# Patient Record
Sex: Male | Born: 1960
Health system: Southern US, Community
[De-identification: ages and names within clinical notes are randomized; demographics above are authoritative.]

## PROBLEM LIST (undated history)

## (undated) DIAGNOSIS — C099 Malignant neoplasm of tonsil, unspecified: Secondary | ICD-10-CM

## (undated) DIAGNOSIS — C44519 Basal cell carcinoma of skin of other part of trunk: Secondary | ICD-10-CM

## (undated) DIAGNOSIS — K219 Gastro-esophageal reflux disease without esophagitis: Secondary | ICD-10-CM

## (undated) DIAGNOSIS — I10 Essential (primary) hypertension: Secondary | ICD-10-CM

## (undated) DIAGNOSIS — I999 Unspecified disorder of circulatory system: Secondary | ICD-10-CM

## (undated) DIAGNOSIS — E7800 Pure hypercholesterolemia, unspecified: Secondary | ICD-10-CM

## (undated) DIAGNOSIS — Z86718 Personal history of other venous thrombosis and embolism: Secondary | ICD-10-CM

## (undated) DIAGNOSIS — C799 Secondary malignant neoplasm of unspecified site: Secondary | ICD-10-CM

## (undated) DIAGNOSIS — R519 Headache, unspecified: Secondary | ICD-10-CM

## (undated) DIAGNOSIS — I8290 Acute embolism and thrombosis of unspecified vein: Secondary | ICD-10-CM

## (undated) DIAGNOSIS — R42 Dizziness and giddiness: Secondary | ICD-10-CM

## (undated) DIAGNOSIS — C44311 Basal cell carcinoma of skin of nose: Secondary | ICD-10-CM

## (undated) HISTORY — DX: Secondary malignant neoplasm of unspecified site: C79.9

## (undated) HISTORY — DX: Basal cell carcinoma of skin of nose: C44.311

## (undated) HISTORY — PX: FEMORAL-FEMORAL BYPASS GRAFT: SHX936

## (undated) HISTORY — DX: Gastro-esophageal reflux disease without esophagitis: K21.9

## (undated) HISTORY — DX: Pure hypercholesterolemia, unspecified: E78.00

## (undated) HISTORY — DX: Dizziness and giddiness: R42

## (undated) HISTORY — DX: Essential (primary) hypertension: I10

## (undated) HISTORY — PX: COLONOSCOPY: SHX174

## (undated) HISTORY — DX: Basal cell carcinoma of skin of other part of trunk: C44.519

---
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
## 2006-02-25 ENCOUNTER — Inpatient Hospital Stay: Payer: Self-pay | Admitting: Internal Medicine

## 2006-02-25 ENCOUNTER — Other Ambulatory Visit: Payer: Self-pay

## 2006-03-26 ENCOUNTER — Ambulatory Visit: Payer: Self-pay | Admitting: *Deleted

## 2008-06-20 ENCOUNTER — Emergency Department (HOSPITAL_COMMUNITY): Admission: EM | Admit: 2008-06-20 | Discharge: 2008-06-20 | Payer: Self-pay | Admitting: Emergency Medicine

## 2009-05-23 ENCOUNTER — Ambulatory Visit: Payer: Self-pay | Admitting: Otolaryngology

## 2011-02-16 DIAGNOSIS — Z8669 Personal history of other diseases of the nervous system and sense organs: Secondary | ICD-10-CM | POA: Insufficient documentation

## 2011-04-06 ENCOUNTER — Observation Stay: Payer: Self-pay | Admitting: Internal Medicine

## 2011-05-19 LAB — DIFFERENTIAL
Basophils Relative: 0
Lymphocytes Relative: 18
Lymphs Abs: 2.1
Monocytes Absolute: 0.7
Monocytes Relative: 6
Neutrophils Relative %: 76

## 2011-05-19 LAB — PROTEIN AND GLUCOSE, CSF
Glucose, CSF: 65
Total  Protein, CSF: 49 — ABNORMAL HIGH

## 2011-05-19 LAB — BASIC METABOLIC PANEL
CO2: 19
Calcium: 9.6
Chloride: 104
Glucose, Bld: 139 — ABNORMAL HIGH
Potassium: 3.3 — ABNORMAL LOW
Sodium: 136

## 2011-05-19 LAB — CSF CELL COUNT WITH DIFFERENTIAL
RBC Count, CSF: 1 — ABNORMAL HIGH
WBC, CSF: 1

## 2011-05-19 LAB — URINALYSIS, ROUTINE W REFLEX MICROSCOPIC
Hgb urine dipstick: NEGATIVE
Ketones, ur: >80 — AB
Nitrite: NEGATIVE
Protein, ur: NEGATIVE
Specific Gravity, Urine: 1.025

## 2011-05-19 LAB — URINE MICROSCOPIC-ADD ON

## 2011-05-19 LAB — GRAM STAIN

## 2011-05-19 LAB — CBC
Platelets: 361
RBC: 5.21

## 2011-05-19 LAB — CSF CULTURE W GRAM STAIN: Culture: NO GROWTH

## 2011-10-05 ENCOUNTER — Encounter: Payer: Self-pay | Admitting: Unknown Physician Specialty

## 2011-10-16 ENCOUNTER — Encounter: Payer: Self-pay | Admitting: Unknown Physician Specialty

## 2011-11-16 ENCOUNTER — Encounter: Payer: Self-pay | Admitting: Unknown Physician Specialty

## 2011-12-16 ENCOUNTER — Encounter: Payer: Self-pay | Admitting: Unknown Physician Specialty

## 2012-02-15 ENCOUNTER — Ambulatory Visit: Payer: Self-pay | Admitting: Neurology

## 2012-07-09 ENCOUNTER — Ambulatory Visit: Payer: Self-pay | Admitting: Neurology

## 2012-07-09 LAB — CREATININE, SERUM
Creatinine: 0.95 mg/dL (ref 0.60–1.30)
EGFR (African American): >60

## 2013-01-17 ENCOUNTER — Ambulatory Visit (INDEPENDENT_AMBULATORY_CARE_PROVIDER_SITE_OTHER): Payer: BC Managed Care – PPO | Admitting: Internal Medicine

## 2013-01-17 ENCOUNTER — Encounter: Payer: Self-pay | Admitting: Internal Medicine

## 2013-01-17 VITALS — BP 124/90 | HR 89 | Temp 97.8°F | Ht 66.5 in | Wt 156.5 lb

## 2013-01-17 DIAGNOSIS — E7800 Pure hypercholesterolemia, unspecified: Secondary | ICD-10-CM | POA: Insufficient documentation

## 2013-01-17 DIAGNOSIS — K219 Gastro-esophageal reflux disease without esophagitis: Secondary | ICD-10-CM

## 2013-01-17 DIAGNOSIS — I10 Essential (primary) hypertension: Secondary | ICD-10-CM | POA: Insufficient documentation

## 2013-01-17 DIAGNOSIS — R42 Dizziness and giddiness: Secondary | ICD-10-CM

## 2013-01-17 LAB — CBC WITH DIFFERENTIAL/PLATELET
Basophils Relative: 0.3 % (ref 0.0–3.0)
Eosinophils Relative: 2.9 % (ref 0.0–5.0)
HCT: 41.2 % (ref 39.0–52.0)
Lymphocytes Relative: 33.4 % (ref 12.0–46.0)
Lymphs Abs: 2.3 10*3/uL (ref 0.7–4.0)
MCHC: 33.4 g/dL (ref 30.0–36.0)
MCV: 84.7 fl (ref 78.0–100.0)
RBC: 4.86 Mil/uL (ref 4.22–5.81)
RDW: 13.7 % (ref 11.5–14.6)

## 2013-01-17 LAB — COMPREHENSIVE METABOLIC PANEL
ALT: 25 U/L (ref 0–53)
AST: 22 U/L (ref 0–37)
Albumin: 4 g/dL (ref 3.5–5.2)
Calcium: 9.3 mg/dL (ref 8.4–10.5)
Chloride: 104 mEq/L (ref 96–112)
Sodium: 138 mEq/L (ref 135–145)
Total Bilirubin: 0.5 mg/dL (ref 0.3–1.2)
Total Protein: 7.8 g/dL (ref 6.0–8.3)

## 2013-01-17 LAB — TSH: TSH: 0.81 u[IU]/mL (ref 0.35–5.50)

## 2013-01-17 LAB — LIPID PANEL
HDL: 38.6 mg/dL — ABNORMAL LOW (ref >39.00)
Total CHOL/HDL Ratio: 7
Triglycerides: 154 mg/dL — ABNORMAL HIGH (ref 0.0–149.0)

## 2013-01-18 ENCOUNTER — Other Ambulatory Visit: Payer: Self-pay | Admitting: Internal Medicine

## 2013-01-18 DIAGNOSIS — E7800 Pure hypercholesterolemia, unspecified: Secondary | ICD-10-CM

## 2013-01-18 NOTE — Progress Notes (Signed)
Order placed for liver panel.  

## 2013-01-19 ENCOUNTER — Other Ambulatory Visit: Payer: Self-pay | Admitting: *Deleted

## 2013-01-19 MED ORDER — ATORVASTATIN CALCIUM 10 MG PO TABS: 10.0000 mg | ORAL_TABLET | Freq: Every day | ORAL | Status: DC

## 2013-01-19 NOTE — Telephone Encounter (Signed)
Spoke with pt re: lab results-Sent in Rx for Lipitor & pt scheduled to have Liver panel checked on 7/31

## 2013-01-23 ENCOUNTER — Encounter: Payer: Self-pay | Admitting: Internal Medicine

## 2013-01-23 DIAGNOSIS — K219 Gastro-esophageal reflux disease without esophagitis: Secondary | ICD-10-CM | POA: Insufficient documentation

## 2013-01-23 DIAGNOSIS — R42 Dizziness and giddiness: Secondary | ICD-10-CM | POA: Insufficient documentation

## 2013-01-23 NOTE — Assessment & Plan Note (Signed)
Low cholesterol diet and exercise.  On Lipitor.  Check lipid panel and liver function.

## 2013-01-23 NOTE — Assessment & Plan Note (Signed)
Symptoms controlled on prilosec.  Follow.   

## 2013-01-23 NOTE — Assessment & Plan Note (Signed)
Has had an extensive w/up.  Has seen multiple neurologists.  Was told his neck was contributing.  Exercises helping.  Follow.  Currently stable.

## 2013-01-23 NOTE — Assessment & Plan Note (Signed)
Blood pressure a little elevated today.  Have him spot check his pressure and record.  Get him back in soon to reassess.  Check metabolic panel.

## 2013-01-23 NOTE — Progress Notes (Signed)
  Subjective:    [name], male    DOB: [birth year], 52 y.o.   MRN: [id number]  HPI 52 year old male with past history of hypertension, hypercholesterolemia, GERD and dizziness.  He comes in today to follow up on these issues as well as to establish care.  Was previously followed at [facility].  Then was evaluated by Dr Gavin Potters.  States he was diagnosed with high blood pressure approximately 7-8 years ago.  Takes Lisinopril.  States controls his blood pressure.  Reports blood pressure averaging 130/90.  Has a history of dizziness.  Has had an extensive w/up.  Has seen Dr Thad Ranger at [facility], Dr Chestine Spore at [place] and a neurologist in [place].  Has had MRIs and other w/up.  Was told that his dizziness was probably a result of some underlying neck issues and pain.  He is doing exercises at home.  This helps.  Prilosec controls his acid reflux.  Reports bowels stable now.  Was previously told he had a "nervous stomach".  Works as a Curator.     Past Medical History  Diagnosis Date  . Hypercholesterolemia   . Hypertension     hx of HBP readings  . GERD (gastroesophageal reflux disease)   . Dizziness     Outpatient Encounter Prescriptions as of 01/17/2013  Medication Sig Dispense Refill  . lisinopril (PRINIVIL,ZESTRIL) 20 MG tablet Take 20 mg by mouth daily.      . Multiple Vitamins-Minerals (MULTIVITAMIN PO) Take by mouth daily.      Marland Kitchen omeprazole (PRILOSEC) 20 MG capsule Take 20 mg by mouth daily.       No facility-administered encounter medications on file as of 01/17/2013.    Review of Systems Patient denies any headache.  Chronic light headedness/dizziness as outlined.  No chest pain, tightness or palpitations.  No increased shortness of breath, cough or congestion.  Acid reflux controlled with prilosec.  No nausea or vomiting.  No abdominal pain or cramping.  No bowel change, such as diarrhea, constipation, BRBPR or melana.  Bowels stable.  No urine change.         Objective:   Physical Exam Filed Vitals:   01/17/13 0927  BP: 124/90  Pulse: 89  Temp: 97.8 F (36.6 C)   Blood pressure recheck:  142/92, pulse 13  52 year old male in no acute distress.  HEENT:  Nares - clear.  Oropharynx - without lesions. NECK:  Supple.  Nontender.  No audible carotid bruit.  HEART:  Appears to be regular.   LUNGS:  No crackles or wheezing audible.  Respirations even and unlabored.   RADIAL PULSE:  Equal bilaterally.  ABDOMEN:  Soft.  Nontender.  Bowel sounds present and normal.  No audible abdominal bruit.   EXTREMITIES:  No increased edema present.  DP pulses palpable and equal bilaterally.         Assessment & Plan:  HEALTH MAINTENANCE.  Will get him back in for a full physical.  Obtain outside records for review.

## 2013-02-06 ENCOUNTER — Telehealth: Payer: Self-pay | Admitting: *Deleted

## 2013-02-06 NOTE — Telephone Encounter (Signed)
On a new cholesterol medication (Lipitor) x 2 weeks. Reports feeling very jittery, and feels like heart rate is up.Please advise

## 2013-02-06 NOTE — Telephone Encounter (Signed)
I am ok if he stops the cholesterol medication, but I am not sure if this is causing the increased heart rate, etc.  If acute sx, then ok to stop, but I would recommend evaluation today to confirm nothing acute going on.  Can go to acute care ([place]).  Keep [place] posted.

## 2013-02-06 NOTE — Telephone Encounter (Signed)
Left detailed message & asked pt to give me a call back

## 2013-03-16 ENCOUNTER — Other Ambulatory Visit (INDEPENDENT_AMBULATORY_CARE_PROVIDER_SITE_OTHER): Payer: BC Managed Care – PPO

## 2013-03-16 DIAGNOSIS — E7800 Pure hypercholesterolemia, unspecified: Secondary | ICD-10-CM

## 2013-03-16 LAB — HEPATIC FUNCTION PANEL
Alkaline Phosphatase: 47 U/L (ref 39–117)
Bilirubin, Direct: 0.1 mg/dL (ref 0.0–0.3)

## 2013-03-22 ENCOUNTER — Ambulatory Visit (INDEPENDENT_AMBULATORY_CARE_PROVIDER_SITE_OTHER): Payer: BC Managed Care – PPO | Admitting: Internal Medicine

## 2013-03-22 ENCOUNTER — Encounter: Payer: Self-pay | Admitting: Internal Medicine

## 2013-03-22 VITALS — BP 130/100 | HR 92 | Temp 98.2°F | Ht 67.5 in | Wt 158.0 lb

## 2013-03-22 DIAGNOSIS — R42 Dizziness and giddiness: Secondary | ICD-10-CM

## 2013-03-22 DIAGNOSIS — Z125 Encounter for screening for malignant neoplasm of prostate: Secondary | ICD-10-CM

## 2013-03-22 DIAGNOSIS — Z1211 Encounter for screening for malignant neoplasm of colon: Secondary | ICD-10-CM

## 2013-03-22 DIAGNOSIS — I10 Essential (primary) hypertension: Secondary | ICD-10-CM

## 2013-03-22 DIAGNOSIS — E7800 Pure hypercholesterolemia, unspecified: Secondary | ICD-10-CM

## 2013-03-22 DIAGNOSIS — K219 Gastro-esophageal reflux disease without esophagitis: Secondary | ICD-10-CM

## 2013-03-24 ENCOUNTER — Encounter: Payer: Self-pay | Admitting: Internal Medicine

## 2013-03-24 NOTE — Progress Notes (Signed)
  Subjective:    [name], male    DOB: 11/28/1960, 52 y.o.   MRN: [id number]  HPI 52 year old male with past history of hypertension, hypercholesterolemia, GERD and dizziness.  He comes in today to follow up on these issues as well as for a complete physical exam.  States he was diagnosed with high blood pressure approximately 7-8 years ago.  Takes Lisinopril.  States controls his blood pressure.  Has a history of dizziness.  Has had an extensive w/up.  Has seen Dr Thad Ranger at [facility], Dr Chestine Spore at [place] and a neurologist in [place].  Has had MRIs and other w/up.  Was told that his dizziness was probably a result of some underlying neck issues and pain.  He is doing exercises at home.  This helps. Feels overall this is stable.  Prilosec controls his acid reflux.  Reports bowels stable now.  Was previously told he had a "nervous stomach".   Stable.  Works as a Curator.     Past Medical History  Diagnosis Date  . Hypercholesterolemia   . Hypertension     hx of HBP readings  . GERD (gastroesophageal reflux disease)   . Dizziness     Outpatient Encounter Prescriptions as of 03/22/2013  Medication Sig Dispense Refill  . lisinopril (PRINIVIL,ZESTRIL) 20 MG tablet Take 20 mg by mouth daily.      . Multiple Vitamins-Minerals (MULTIVITAMIN PO) Take by mouth daily.      Marland Kitchen omeprazole (PRILOSEC) 20 MG capsule Take 20 mg by mouth daily.      . [DISCONTINUED] atorvastatin (LIPITOR) 10 MG tablet Take 1 tablet (10 mg total) by mouth daily.  30 tablet  5   No facility-administered encounter medications on file as of 03/22/2013.    Review of Systems Patient denies any headache.  Chronic light headedness/dizziness as outlined.  Stable.   No chest pain, tightness or palpitations.  No increased shortness of breath, cough or congestion.  Acid reflux controlled with prilosec.  No nausea or vomiting.  No abdominal pain or cramping.  No bowel change, such as diarrhea, constipation, BRBPR or  melana.  Bowels stable.  No urine change.        Objective:   Physical Exam  Filed Vitals:   03/22/13 0927  BP: 130/100  Pulse: 92  Temp: 98.2 F (36.8 C)   Blood pressure recheck:  31/37  52 year old male in no acute distress.  HEENT:  Nares - clear.  Oropharynx - without lesions. NECK:  Supple.  Nontender.  No audible carotid bruit.  HEART:  Appears to be regular.   LUNGS:  No crackles or wheezing audible.  Respirations even and unlabored.   RADIAL PULSE:  Equal bilaterally.  ABDOMEN:  Soft.  Nontender.  Bowel sounds present and normal.  No audible abdominal bruit.  GU:  Normal descended testicles.  No palpable testicular nodules.   RECTAL:  Could not appreciate any palpable prostate nodules.  Heme negative.   EXTREMITIES:  No increased edema present.  DP pulses palpable and equal bilaterally.        Assessment & Plan:  HEALTH MAINTENANCE.  Physical today.  Check PSA next labs.  Cholesterol as outlined.    Discussed the need for colonoscopy.   He was in agreement.  Refer to GI for screening colonoscopy.

## 2013-03-24 NOTE — Assessment & Plan Note (Signed)
Low cholesterol diet and exercise.  Intolerant to statin.  Will follow.  Remain off cholesterol medicine for now.     

## 2013-03-24 NOTE — Assessment & Plan Note (Signed)
Has had an extensive w/up.  Has seen multiple neurologists.  Was told his neck was contributing.  Exercises helping.  Follow.  Currently stable.

## 2013-03-24 NOTE — Assessment & Plan Note (Signed)
Blood pressure as outlined.  States it has been better recently.  Have him to continue to spot check his pressure and record.  Get him back in soon to reassess.  Continue same medication regimen for now.

## 2013-03-24 NOTE — Assessment & Plan Note (Signed)
Symptoms controlled on prilosec.  Follow.   

## 2013-06-15 ENCOUNTER — Other Ambulatory Visit: Payer: BC Managed Care – PPO

## 2013-06-22 ENCOUNTER — Ambulatory Visit: Payer: BC Managed Care – PPO | Admitting: Internal Medicine

## 2013-06-23 ENCOUNTER — Ambulatory Visit: Payer: Self-pay | Admitting: Unknown Physician Specialty

## 2013-07-05 ENCOUNTER — Encounter: Payer: Self-pay | Admitting: Internal Medicine

## 2013-07-24 ENCOUNTER — Encounter: Payer: Self-pay | Admitting: Internal Medicine

## 2013-07-25 ENCOUNTER — Other Ambulatory Visit: Payer: Self-pay | Admitting: *Deleted

## 2013-07-25 ENCOUNTER — Telehealth: Payer: Self-pay | Admitting: Internal Medicine

## 2013-07-25 MED ORDER — OMEPRAZOLE 20 MG PO CPDR: 20.0000 mg | DELAYED_RELEASE_CAPSULE | Freq: Two times a day (BID) | ORAL | Status: DC

## 2013-07-25 NOTE — Telephone Encounter (Signed)
At his last visit, he had reported his acid reflux was controlled.  This is a change for him.  I am ok to increase his prilosec to 20mg  bid, but he needs an appt with me to f/u and discuss further treatment and evaluation.

## 2013-07-25 NOTE — Telephone Encounter (Signed)
Patient wanting his reflux medication (omeprazole (PRILOSEC) 20 MG capsule) increased. He still has acid coming up.

## 2013-07-25 NOTE — Telephone Encounter (Signed)
Please advise 

## 2013-07-25 NOTE — Telephone Encounter (Signed)
Pt notified & states that he will call back to setup appt. Sent in a 30 day with 1 additional refill (pt aware)

## 2013-09-12 ENCOUNTER — Encounter (INDEPENDENT_AMBULATORY_CARE_PROVIDER_SITE_OTHER): Payer: Self-pay

## 2013-09-12 ENCOUNTER — Encounter: Payer: Self-pay | Admitting: Internal Medicine

## 2013-09-12 ENCOUNTER — Ambulatory Visit (INDEPENDENT_AMBULATORY_CARE_PROVIDER_SITE_OTHER): Payer: BC Managed Care – PPO | Admitting: Internal Medicine

## 2013-09-12 VITALS — BP 138/90 | HR 101 | Temp 98.2°F | Ht 67.5 in | Wt 161.8 lb

## 2013-09-12 DIAGNOSIS — Z125 Encounter for screening for malignant neoplasm of prostate: Secondary | ICD-10-CM

## 2013-09-12 DIAGNOSIS — M542 Cervicalgia: Secondary | ICD-10-CM

## 2013-09-12 DIAGNOSIS — R42 Dizziness and giddiness: Secondary | ICD-10-CM

## 2013-09-12 DIAGNOSIS — I10 Essential (primary) hypertension: Secondary | ICD-10-CM

## 2013-09-12 DIAGNOSIS — K219 Gastro-esophageal reflux disease without esophagitis: Secondary | ICD-10-CM

## 2013-09-12 DIAGNOSIS — E7800 Pure hypercholesterolemia, unspecified: Secondary | ICD-10-CM

## 2013-09-12 LAB — COMPREHENSIVE METABOLIC PANEL
ALT: 28 U/L (ref 0–53)
AST: 24 U/L (ref 0–37)
Albumin: 4.2 g/dL (ref 3.5–5.2)
Alkaline Phosphatase: 59 U/L (ref 39–117)
BILIRUBIN TOTAL: 0.8 mg/dL (ref 0.3–1.2)
BUN: 19 mg/dL (ref 6–23)
CALCIUM: 9.6 mg/dL (ref 8.4–10.5)
CHLORIDE: 104 meq/L (ref 96–112)
CO2: 28 meq/L (ref 19–32)
Creatinine, Ser: 1 mg/dL (ref 0.4–1.5)
GFR: 88.08 mL/min (ref >60.00)
GLUCOSE: 89 mg/dL (ref 70–99)
POTASSIUM: 4.7 meq/L (ref 3.5–5.1)
SODIUM: 138 meq/L (ref 135–145)
TOTAL PROTEIN: 7.5 g/dL (ref 6.0–8.3)

## 2013-09-12 LAB — PSA: PSA: 0.61 ng/mL (ref 0.10–4.00)

## 2013-09-12 MED ORDER — OMEPRAZOLE 20 MG PO CPDR: 20.0000 mg | DELAYED_RELEASE_CAPSULE | Freq: Two times a day (BID) | ORAL | Status: DC

## 2013-09-12 MED ORDER — LISINOPRIL 20 MG PO TABS: 20.0000 mg | ORAL_TABLET | Freq: Every day | ORAL | Status: DC

## 2013-09-12 NOTE — Progress Notes (Signed)
  Subjective:    [name], male    DOB: [birth year], 53 y.o.   MRN: [id number]  HPI 53 year old male with past history of hypertension, hypercholesterolemia, GERD and dizziness.  He comes in today to for a scheduled follow up.  States he was diagnosed with high blood pressure approximately 7-8 years ago.  Takes Lisinopril.  States controls his blood pressure.  Has a history of dizziness.  Has had an extensive w/up.  Has seen Dr Doy Mince at [facility], Dr Carlis Abbott at [place] and a neurologist in [place].  Has had MRIs and other w/up.  Was told that his dizziness was probably a result of some underlying neck issues and pain.  Has had MRI of his neck.  He is doing exercises at home.  This helps.   Feels neck and upper back - worsening.    Prilosec controls his acid reflux.  Reports bowels stable now.  Was previously told he had a "nervous stomach".   Stable.  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  Feels if he can get this under control, dizziness will get better.  States it feels his neck is tight and feels like a knife stuck in his back.  Reports blood pressure averaging 110/75-125/85 (135-140/<90.     Past Medical History  Diagnosis Date  . Hypercholesterolemia   . Hypertension     hx of HBP readings  . GERD (gastroesophageal reflux disease)   . Dizziness     Outpatient Encounter Prescriptions as of 09/12/2013  Medication Sig  . lisinopril (PRINIVIL,ZESTRIL) 20 MG tablet Take 20 mg by mouth daily.  . Multiple Vitamins-Minerals (MULTIVITAMIN PO) Take by mouth daily.  Marland Kitchen omeprazole (PRILOSEC) 20 MG capsule Take 1 capsule (20 mg total) by mouth 2 (two) times daily.    Review of Systems Patient denies any headache.  Chronic light headedness/dizziness as outlined.  No chest pain, tightness or palpitations.  No increased shortness of breath, cough or congestion.  Acid reflux controlled with prilosec.  No nausea or vomiting.  No abdominal pain or cramping.  No  bowel change, such as diarrhea, constipation, BRBPR or melana.  Bowels stable.  No urine change.   Blood pressure as outlined.  Some neck stiffness and knife like sensation in his back.       Objective:   Physical Exam  Filed Vitals:   09/12/13 1352  BP: 138/90  Pulse: 101  Temp: 98.2 F (36.8 C)   Blood pressure recheck:  79/60-25  53 year old male in no acute distress.  HEENT:  Nares - clear.  Oropharynx - without lesions. NECK:  Supple.  Non tender.    No audible carotid bruit.  HEART:  Appears to be regular.   LUNGS:  No crackles or wheezing audible.  Respirations even and unlabored.   RADIAL PULSE:  Equal bilaterally.  ABDOMEN:  Soft.  Nontender.  Bowel sounds present and normal.  No audible abdominal bruit.  EXTREMITIES:  No increased edema present.  DP pulses palpable and equal bilaterally.        Assessment & Plan:  HEALTH MAINTENANCE.  Physical 03/22/13.  Check PSA next labs.  Cholesterol as outlined.  Colonoscopy 06/23/13 internal hemorrhoids.

## 2013-09-12 NOTE — Progress Notes (Signed)
Pre-visit discussion using our clinic review tool. No additional management support is needed unless otherwise documented below in the visit note.  

## 2013-09-13 ENCOUNTER — Encounter: Payer: Self-pay | Admitting: *Deleted

## 2013-09-16 ENCOUNTER — Encounter: Payer: Self-pay | Admitting: Internal Medicine

## 2013-09-16 DIAGNOSIS — M542 Cervicalgia: Secondary | ICD-10-CM | POA: Insufficient documentation

## 2013-09-16 NOTE — Assessment & Plan Note (Signed)
Described the neck pain and upper back pain.  Has been worked up in the past.  Had MRI.  Worsening problems recently.  Desires not to take pain medication.  Has done therapy and home exercise.  Will refer to Dr Sharlet Salina for further evaluation and treatment.  Will hold on further scanning until Dr Sharlet Salina review.

## 2013-09-16 NOTE — Assessment & Plan Note (Signed)
Symptoms controlled on prilosec.  Follow.   

## 2013-09-16 NOTE — Assessment & Plan Note (Signed)
Has had an extensive w/up.  Has seen multiple neurologists.  Was told his neck was contributing.  Doing exercises at home.  With neck stiffness and upper back pain.  Request further w/up and evaluation.  After discussion, it was decided to refer to Dr Sharlet Salina for further evaluation and treatment.  Has had MRI, etc.  Desires not to take medication for the pain and discomfort.

## 2013-09-16 NOTE — Assessment & Plan Note (Signed)
Low cholesterol diet and exercise.  Intolerant to statin.  Will follow.  Remain off cholesterol medicine for now.     

## 2013-09-16 NOTE — Assessment & Plan Note (Signed)
Blood pressure as outlined.  Have him to continue to spot check his pressure and record.   Continue same medication regimen for now.  Follow.  Check metabolic panel.

## 2013-09-26 ENCOUNTER — Telehealth: Payer: Self-pay | Admitting: Internal Medicine

## 2013-09-26 DIAGNOSIS — M542 Cervicalgia: Secondary | ICD-10-CM

## 2013-09-26 NOTE — Telephone Encounter (Signed)
In progress

## 2013-09-26 NOTE — Telephone Encounter (Signed)
The patient was calling to see if Dr. Nicki Reaper was going to refer him to [facility] for the pain in his neck and back. He was informed at his last visit that he would be referred out to see someone at [place]. I don't see a referral in the system.

## 2013-09-26 NOTE — Telephone Encounter (Signed)
See his phone message.  Please schedule an appt with Dr Sharlet Salina.  Info in my last note.  Thanks.

## 2013-09-26 NOTE — Telephone Encounter (Signed)
Please advise 

## 2013-09-26 NOTE — Telephone Encounter (Signed)
Noted  

## 2013-10-24 DIAGNOSIS — C44310 Basal cell carcinoma of skin of unspecified parts of face: Secondary | ICD-10-CM | POA: Insufficient documentation

## 2013-11-16 ENCOUNTER — Encounter: Payer: Self-pay | Admitting: Internal Medicine

## 2013-11-16 ENCOUNTER — Ambulatory Visit (INDEPENDENT_AMBULATORY_CARE_PROVIDER_SITE_OTHER): Payer: BC Managed Care – PPO | Admitting: Internal Medicine

## 2013-11-16 VITALS — BP 140/90 | HR 93 | Temp 98.1°F | Ht 67.5 in | Wt 160.2 lb

## 2013-11-16 DIAGNOSIS — R42 Dizziness and giddiness: Secondary | ICD-10-CM

## 2013-11-16 DIAGNOSIS — M542 Cervicalgia: Secondary | ICD-10-CM

## 2013-11-16 DIAGNOSIS — K219 Gastro-esophageal reflux disease without esophagitis: Secondary | ICD-10-CM

## 2013-11-16 DIAGNOSIS — E7800 Pure hypercholesterolemia, unspecified: Secondary | ICD-10-CM

## 2013-11-16 DIAGNOSIS — I10 Essential (primary) hypertension: Secondary | ICD-10-CM

## 2013-11-16 NOTE — Assessment & Plan Note (Addendum)
Described the neck pain and upper back pain.  Has been worked up in the past.  Had MRI.  Worsening problems recently.  Desires not to take pain medication.  Has done therapy and home exercise.  Will refer to Dr Sharlet Salina for further evaluation and treatment.  Will hold on further scanning until Dr Sharlet Salina review.  Has an appt with Dr Sharlet Salina in a few days.

## 2013-11-16 NOTE — Progress Notes (Signed)
Pre-visit discussion using our clinic review tool. No additional management support is needed unless otherwise documented below in the visit note.  

## 2013-11-16 NOTE — Progress Notes (Signed)
  Subjective:    [name], male    DOB: 06-19-1961, 53 y.o.   MRN: [id number]  HPI 53 year old male with past history of hypertension, hypercholesterolemia, GERD and dizziness.  He comes in today for a scheduled follow up.  States he was diagnosed with high blood pressure approximately 7-8 years ago.  Takes Lisinopril.  States controls his blood pressure.  Has a history of dizziness.  Has had an extensive w/up.  Has seen Dr Doy Mince at [facility], Dr Carlis Abbott at [place] and a neurologist in [place].  Has had MRIs and other w/up.  Was told that his dizziness was probably a result of some underlying neck issues and pain.  Has had MRI of his neck.  He is doing exercises at home.  This helps.   Feels neck and upper back - worsening.    When he is busier at work, neck and subsequently his dizziness is worse.  Has an appt with Dr Sharlet Salina in a few days to discuss other treatment options.  Prilosec controls his acid reflux.  Reports bowels stable now.  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  Feels if he can get this under control, dizziness will get better.  States it feels his neck is tight and feels like a knife stuck in his back.  Reports blood pressure averaging 118-130/70-85.      Past Medical History  Diagnosis Date  . Hypercholesterolemia   . Hypertension     hx of HBP readings  . GERD (gastroesophageal reflux disease)   . Dizziness     Outpatient Encounter Prescriptions as of 11/16/2013  Medication Sig  . lisinopril (PRINIVIL,ZESTRIL) 20 MG tablet Take 1 tablet (20 mg total) by mouth daily.  . Multiple Vitamins-Minerals (MULTIVITAMIN PO) Take by mouth daily.  Marland Kitchen omeprazole (PRILOSEC) 20 MG capsule Take 1 capsule (20 mg total) by mouth 2 (two) times daily.    Review of Systems Patient denies any headache.  Chronic light headedness/dizziness as outlined.  No chest pain, tightness or palpitations.  No increased shortness of breath, cough or congestion.  Acid  reflux controlled with prilosec.  No nausea or vomiting.  No abdominal pain or cramping.  No bowel change, such as diarrhea, constipation, BRBPR or melana.  Bowels stable.  No urine change.   Blood pressure as outlined.  Some neck stiffness and knife like sensation in his back.  Persistent neck pain.       Objective:   Physical Exam  Filed Vitals:   11/16/13 1537  BP: 140/90  Pulse: 93  Temp: 98.1 F (36.7 C)   Blood pressure recheck:  3-6/60  53 year old male in no acute distress.  HEENT:  Nares - clear.  Oropharynx - without lesions. NECK:  Supple.  Non tender.    No audible carotid bruit.  HEART:  Appears to be regular.   LUNGS:  No crackles or wheezing audible.  Respirations even and unlabored.   RADIAL PULSE:  Equal bilaterally.  ABDOMEN:  Soft.  Nontender.  Bowel sounds present and normal.  No audible abdominal bruit.  EXTREMITIES:  No increased edema present.  DP pulses palpable and equal bilaterally.        Assessment & Plan:  HEALTH MAINTENANCE.  Physical 03/22/13.  PSA 09/12/13 - .61.   Cholesterol as outlined.  Colonoscopy 06/23/13 internal hemorrhoids.

## 2013-11-16 NOTE — Assessment & Plan Note (Addendum)
Has had an extensive w/up.  Has seen multiple neurologists.  Was told his neck was contributing.  Doing exercises at home.  With neck stiffness and upper back pain.  Request further w/up and evaluation.  After discussion, it was decided to refer to Dr Sharlet Salina for further evaluation and treatment.  Has had MRI, etc.  Desires not to take medication for the pain and discomfort.  Has the appt with Dr Sharlet Salina in a few days.

## 2013-11-19 ENCOUNTER — Encounter: Payer: Self-pay | Admitting: Internal Medicine

## 2013-11-19 NOTE — Assessment & Plan Note (Signed)
Blood pressure as outlined.  Have him to continue to spot check his pressure and record.   Continue same medication regimen for now.  Follow.  Follow metabolic panel.

## 2013-11-19 NOTE — Assessment & Plan Note (Signed)
Low cholesterol diet and exercise.  Intolerant to statin.  Will follow.  Remain off cholesterol medicine for now.     

## 2013-11-19 NOTE — Assessment & Plan Note (Signed)
Symptoms controlled on prilosec.  Follow.   

## 2013-12-05 ENCOUNTER — Encounter: Payer: Self-pay | Admitting: *Deleted

## 2014-02-07 DIAGNOSIS — M7918 Myalgia, other site: Secondary | ICD-10-CM | POA: Insufficient documentation

## 2014-02-07 DIAGNOSIS — M5030 Other cervical disc degeneration, unspecified cervical region: Secondary | ICD-10-CM | POA: Insufficient documentation

## 2014-03-05 ENCOUNTER — Other Ambulatory Visit: Payer: Self-pay | Admitting: Internal Medicine

## 2014-03-22 ENCOUNTER — Other Ambulatory Visit: Payer: Self-pay | Admitting: Internal Medicine

## 2014-04-12 ENCOUNTER — Telehealth: Payer: Self-pay | Admitting: Internal Medicine

## 2014-04-12 ENCOUNTER — Encounter: Payer: Self-pay | Admitting: Internal Medicine

## 2014-04-12 ENCOUNTER — Ambulatory Visit (INDEPENDENT_AMBULATORY_CARE_PROVIDER_SITE_OTHER): Payer: BC Managed Care – PPO | Admitting: Internal Medicine

## 2014-04-12 VITALS — BP 130/80 | HR 89 | Temp 98.0°F | Ht 67.0 in | Wt 158.5 lb

## 2014-04-12 DIAGNOSIS — K219 Gastro-esophageal reflux disease without esophagitis: Secondary | ICD-10-CM

## 2014-04-12 DIAGNOSIS — R400 Somnolence: Secondary | ICD-10-CM

## 2014-04-12 DIAGNOSIS — G4710 Hypersomnia, unspecified: Secondary | ICD-10-CM

## 2014-04-12 DIAGNOSIS — E7800 Pure hypercholesterolemia, unspecified: Secondary | ICD-10-CM

## 2014-04-12 DIAGNOSIS — I10 Essential (primary) hypertension: Secondary | ICD-10-CM

## 2014-04-12 DIAGNOSIS — R0602 Shortness of breath: Secondary | ICD-10-CM

## 2014-04-12 DIAGNOSIS — R42 Dizziness and giddiness: Secondary | ICD-10-CM

## 2014-04-12 DIAGNOSIS — R079 Chest pain, unspecified: Secondary | ICD-10-CM

## 2014-04-12 DIAGNOSIS — M542 Cervicalgia: Secondary | ICD-10-CM

## 2014-04-12 NOTE — Telephone Encounter (Signed)
Pt need 4 week follow up for bp. Pt needs late Thursday or Friday mornings. Please advise

## 2014-04-12 NOTE — Progress Notes (Signed)
Pre visit review using our clinic review tool, if applicable. No additional management support is needed unless otherwise documented below in the visit note. 

## 2014-04-13 NOTE — Telephone Encounter (Signed)
I can see him 05/03/14 at 4:15 or 05/11/14 at 11:45

## 2014-04-15 ENCOUNTER — Encounter: Payer: Self-pay | Admitting: Internal Medicine

## 2014-04-15 DIAGNOSIS — R400 Somnolence: Secondary | ICD-10-CM | POA: Insufficient documentation

## 2014-04-15 NOTE — Assessment & Plan Note (Signed)
Has had an extensive w/up.  Has seen multiple neurologists.  Was told his neck was contributing.  Doing exercises at home.  With neck stiffness and upper back pain.  Has had MRI, etc.  Desires not to take medication for the pain and discomfort.  Saw Dr Sharlet Salina.  Home exercises are helping.  Follow.

## 2014-04-15 NOTE — Assessment & Plan Note (Signed)
Symptoms controlled on two prilosec per day.  Symptoms worsened when he cut back.  Now controlled.  Follow.

## 2014-04-15 NOTE — Assessment & Plan Note (Signed)
Blood pressure elevated on recheck.  States controlled at home.  Will continue to spot check his pressure.  Hold on additional medications.  Get him back soon to reassess.  Have him bring his cuff with him to make sure correlates.

## 2014-04-15 NOTE — Progress Notes (Signed)
Subjective:    [name], male    DOB: Dec 07, 1960, 53 y.o.   MRN: [id number]  HPI 53 year old male with past history of hypertension, hypercholesterolemia, GERD and dizziness.  He comes in today to follow up on these issues as well as for a complete physical exam.  States he was diagnosed with high blood pressure approximately 7-8 years ago.  Takes Lisinopril.  States controls his blood pressure.  Reports blood pressure has always been higher in the doctor's office.  Has a history of dizziness.  Has had an extensive w/up.  Has seen Dr Doy Mince at [facility], Dr Carlis Abbott at [place] and a neurologist in [place].  Has had MRIs and other w/up.  Was told that his dizziness was probably a result of some underlying neck issues and pain.  Has had MRI of his neck.  Has seen Dr Sharlet Salina.  Prefers not to take medication for his neck.  He is doing exercises at home.  Feels is better.  Prilosec controls his acid reflux.  Reports bowels stable now.  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  Frequently wakes up at night.  Does not feel rested.  Feels this may be aggravating his neck symptoms and his dizziness.  Discussed sleep study.  He is agreeable.  He does report he tried to decrease his prilosec.  He noticed worsening symptoms.  Chest discomfort and increased acid at night.  This resolved with restarting 2 prilosec per day.     Past Medical History  Diagnosis Date  . Hypercholesterolemia   . Hypertension     hx of HBP readings  . GERD (gastroesophageal reflux disease)   . Dizziness     Outpatient Encounter Prescriptions as of 04/12/2014  Medication Sig  . lisinopril (PRINIVIL,ZESTRIL) 20 MG tablet TAKE 1 TABLET (20 MG TOTAL) BY MOUTH DAILY.  . Multiple Vitamins-Minerals (MULTIVITAMIN PO) Take by mouth daily.  Marland Kitchen omeprazole (PRILOSEC) 20 MG capsule TAKE 1 CAPSULE (20 MG TOTAL) BY MOUTH 2 (TWO) TIMES DAILY.    Review of Systems Patient denies any headache.  Chronic light  headedness/dizziness as outlined.  No chest pain, tightness or palpitations.  No increased shortness of breath, cough or congestion.  Acid reflux now controlled with prilosec.  No nausea or vomiting.  No abdominal pain or cramping.  No bowel change, such as diarrhea, constipation, BRBPR or melana.  Bowels stable.  No urine change.   Some neck stiffness.  Better with exercise at home.  Fatigue as outlined.  Daytime fatigue.  See above.       Objective:   Physical Exam  Filed Vitals:   04/12/14 1500  BP: 130/80  Pulse: 89  Temp: 98 F (36.7 C)   Blood pressure recheck:  138-40/45-72  53 year old male in no acute distress.  HEENT:  Nares - clear.  Oropharynx - without lesions. NECK:  Supple.  Nontender.  No audible carotid bruit.  HEART:  Appears to be regular.   LUNGS:  No crackles or wheezing audible.  Respirations even and unlabored.   RADIAL PULSE:  Equal bilaterally.  ABDOMEN:  Soft.  Nontender.  Bowel sounds present and normal.  No audible abdominal bruit.  GU:  Normal descended testicles.  No palpable testicular nodules.   RECTAL:  Could not appreciate any palpable prostate nodules.  Heme negative.   EXTREMITIES:  No increased edema present.  DP pulses palpable and equal bilaterally.  Assessment & Plan:  CARDIOLOGY.  With the chest discomfort, EKG obtained.  EKG revealed SR with no acute ischemic changes.  Discussed further cardiac w/up.  No symptoms with increased activity or exertion.  Back on two prilosec now and feels better.  Wants to hold on any further cardiac w/up at this time.  Follow.    HEALTH MAINTENANCE.  Physical today.  PSA 09/12/13 - .61.   Cholesterol as outlined.  Colonoscopy 06/23/13 internal hemorrhoids.   I spent 25 minutes with the patient and more than 50% of the time was spent in consultation regarding the above.

## 2014-04-15 NOTE — Assessment & Plan Note (Signed)
Low cholesterol diet and exercise.  Intolerant to statin.  Will follow.  Remain off cholesterol medicine for now.

## 2014-04-15 NOTE — Assessment & Plan Note (Signed)
Concern over possible sleep apnea.  Wakes up at night.  Does not feel rested, etc.  Will schedule split night sleep study.

## 2014-04-15 NOTE — Assessment & Plan Note (Signed)
Described the neck pain and upper back pain previously.  Has been worked up in the past.  Had MRI.  Saw Dr Sharlet Salina.  Prefers not to take medication.  Continue home exercise.

## 2014-04-20 ENCOUNTER — Other Ambulatory Visit (INDEPENDENT_AMBULATORY_CARE_PROVIDER_SITE_OTHER): Payer: BC Managed Care – PPO

## 2014-04-20 DIAGNOSIS — E7800 Pure hypercholesterolemia, unspecified: Secondary | ICD-10-CM

## 2014-04-20 DIAGNOSIS — R42 Dizziness and giddiness: Secondary | ICD-10-CM

## 2014-04-20 LAB — CBC WITH DIFFERENTIAL/PLATELET
BASOS PCT: 0.7 % (ref 0.0–3.0)
Basophils Absolute: 0.1 10*3/uL (ref 0.0–0.1)
Eosinophils Absolute: 0.5 10*3/uL (ref 0.0–0.7)
Eosinophils Relative: 6.4 % — ABNORMAL HIGH (ref 0.0–5.0)
HCT: 40.4 % (ref 39.0–52.0)
Hemoglobin: 13.5 g/dL (ref 13.0–17.0)
Lymphocytes Relative: 37.2 % (ref 12.0–46.0)
Lymphs Abs: 2.9 10*3/uL (ref 0.7–4.0)
MCHC: 33.3 g/dL (ref 30.0–36.0)
MCV: 85.1 fl (ref 78.0–100.0)
MONOS PCT: 10 % (ref 3.0–12.0)
Monocytes Absolute: 0.8 10*3/uL (ref 0.1–1.0)
Neutro Abs: 3.6 10*3/uL (ref 1.4–7.7)
Neutrophils Relative %: 45.7 % (ref 43.0–77.0)
PLATELETS: 353 10*3/uL (ref 150.0–400.0)
RBC: 4.74 Mil/uL (ref 4.22–5.81)
RDW: 14 % (ref 11.5–15.5)
WBC: 7.9 10*3/uL (ref 4.0–10.5)

## 2014-04-20 LAB — COMPREHENSIVE METABOLIC PANEL
ALBUMIN: 4 g/dL (ref 3.5–5.2)
ALT: 32 U/L (ref 0–53)
AST: 33 U/L (ref 0–37)
Alkaline Phosphatase: 48 U/L (ref 39–117)
BUN: 17 mg/dL (ref 6–23)
CALCIUM: 9.4 mg/dL (ref 8.4–10.5)
CHLORIDE: 104 meq/L (ref 96–112)
CO2: 29 meq/L (ref 19–32)
Creatinine, Ser: 0.9 mg/dL (ref 0.4–1.5)
GFR: 92.35 mL/min (ref >60.00)
Glucose, Bld: 97 mg/dL (ref 70–99)
Potassium: 4.2 mEq/L (ref 3.5–5.1)
SODIUM: 137 meq/L (ref 135–145)
TOTAL PROTEIN: 7.7 g/dL (ref 6.0–8.3)
Total Bilirubin: 0.7 mg/dL (ref 0.2–1.2)

## 2014-04-20 LAB — LIPID PANEL
Cholesterol: 277 mg/dL — ABNORMAL HIGH (ref 0–200)
HDL: 37.3 mg/dL — ABNORMAL LOW (ref >39.00)
LDL Cholesterol: 202 mg/dL — ABNORMAL HIGH (ref 0–99)
NonHDL: 239.7
Total CHOL/HDL Ratio: 7
Triglycerides: 190 mg/dL — ABNORMAL HIGH (ref 0.0–149.0)
VLDL: 38 mg/dL (ref 0.0–40.0)

## 2014-04-20 LAB — TSH: TSH: 1.14 u[IU]/mL (ref 0.35–4.50)

## 2014-04-21 ENCOUNTER — Other Ambulatory Visit: Payer: Self-pay | Admitting: Internal Medicine

## 2014-04-21 DIAGNOSIS — E7800 Pure hypercholesterolemia, unspecified: Secondary | ICD-10-CM

## 2014-04-21 NOTE — Progress Notes (Signed)
Order placed for liver panel.  

## 2014-05-03 ENCOUNTER — Encounter: Payer: Self-pay | Admitting: Internal Medicine

## 2014-05-03 ENCOUNTER — Ambulatory Visit (INDEPENDENT_AMBULATORY_CARE_PROVIDER_SITE_OTHER): Payer: BC Managed Care – PPO | Admitting: Internal Medicine

## 2014-05-03 VITALS — BP 130/80 | HR 92 | Temp 98.4°F | Ht 67.0 in | Wt 158.5 lb

## 2014-05-03 DIAGNOSIS — R42 Dizziness and giddiness: Secondary | ICD-10-CM

## 2014-05-03 DIAGNOSIS — K219 Gastro-esophageal reflux disease without esophagitis: Secondary | ICD-10-CM

## 2014-05-03 DIAGNOSIS — I10 Essential (primary) hypertension: Secondary | ICD-10-CM

## 2014-05-03 DIAGNOSIS — E7800 Pure hypercholesterolemia, unspecified: Secondary | ICD-10-CM

## 2014-05-03 DIAGNOSIS — R400 Somnolence: Secondary | ICD-10-CM

## 2014-05-03 DIAGNOSIS — G4710 Hypersomnia, unspecified: Secondary | ICD-10-CM

## 2014-05-03 MED ORDER — LISINOPRIL 30 MG PO TABS: 30.0000 mg | ORAL_TABLET | Freq: Every day | ORAL | Status: DC

## 2014-05-03 NOTE — Progress Notes (Signed)
Pre visit review using our clinic review tool, if applicable. No additional management support is needed unless otherwise documented below in the visit note. 

## 2014-05-06 ENCOUNTER — Encounter: Payer: Self-pay | Admitting: Internal Medicine

## 2014-05-06 NOTE — Assessment & Plan Note (Signed)
Blood pressure elevated on recheck.  States controlled at home.  Will continue to spot check his pressure.  Increase lisinopril to 30mg  q day.   Get him back soon to reassess.  Follow pressures.  Follow metabolic panel.

## 2014-05-06 NOTE — Assessment & Plan Note (Signed)
Symptoms controlled on two prilosec per day.  Symptoms worsened when he cut back.  Now controlled.  Follow.

## 2014-05-06 NOTE — Assessment & Plan Note (Signed)
Has had an extensive w/up.  Has seen multiple neurologists.  Was told his neck was contributing.  Doing exercises at home.  With neck stiffness and upper back pain.  Has had MRI, etc.  Desires not to take medication for the pain and discomfort.  Saw Dr Sharlet Salina.  Home exercises are helping.  Follow.

## 2014-05-06 NOTE — Assessment & Plan Note (Signed)
Concern over possible sleep apnea.  Wakes up at night.  Does not feel rested, etc.  Will schedule split night sleep study.

## 2014-05-06 NOTE — Progress Notes (Signed)
Subjective:    [name], male    DOB: November 03, 1960, 53 y.o.   MRN: [id number]  HPI 53 year old male with past history of hypertension, hypercholesterolemia, GERD and dizziness.  He comes in today for a scheduled follow up.  Here to f/u regarding his blood pressure.   States he was diagnosed with high blood pressure approximately 7-8 years ago.  Takes Lisinopril.  States controls his blood pressure.  Reports blood pressure has always been higher in the doctor's office.  Has a history of dizziness.  Has had an extensive w/up.  Has seen Dr Doy Mince at [facility], Dr Carlis Abbott at [place] and a neurologist in [place].  Has had MRIs and other w/up.  Was told that his dizziness was probably a result of some underlying neck issues and pain.  Has had MRI of his neck.  Has seen Dr Sharlet Salina.  Prefers not to take medication for his neck.  He is doing exercises at home.  Feels is better with the exercise.   Prilosec controls his acid reflux.  Reports bowels stable now.  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  Frequently wakes up at night.  Does not feel rested.  Feels this may be aggravating his neck symptoms and his dizziness.  Discussed sleep study.  He is agreeable.      Past Medical History  Diagnosis Date  . Hypercholesterolemia   . Hypertension     hx of HBP readings  . GERD (gastroesophageal reflux disease)   . Dizziness     Outpatient Encounter Prescriptions as of 05/03/2014  Medication Sig  . Multiple Vitamins-Minerals (MULTIVITAMIN PO) Take by mouth daily.  Marland Kitchen omeprazole (PRILOSEC) 20 MG capsule TAKE 1 CAPSULE (20 MG TOTAL) BY MOUTH 2 (TWO) TIMES DAILY.  . [DISCONTINUED] lisinopril (PRINIVIL,ZESTRIL) 20 MG tablet TAKE 1 TABLET (20 MG TOTAL) BY MOUTH DAILY.  Marland Kitchen lisinopril (PRINIVIL,ZESTRIL) 30 MG tablet Take 1 tablet (30 mg total) by mouth daily.    Review of Systems Patient denies any headache.  Chronic light headedness/dizziness as outlined.  No chest pain, tightness  or palpitations.  No increased shortness of breath, cough or congestion.  Acid reflux now controlled with prilosec.  No nausea or vomiting.  No abdominal pain or cramping.  No bowel change, such as diarrhea, constipation, BRBPR or melana.  Bowels stable.  No urine change.   Better with exercise at home.  Fatigue as outlined.  Daytime fatigue.  See above.       Objective:   Physical Exam  Filed Vitals:   05/03/14 1610  BP: 130/80  Pulse: 92  Temp: 98.4 F (36.9 C)   Blood pressure recheck:  1148-58/31-48  53 year old male in no acute distress.  HEENT:  Nares - clear.  Oropharynx - without lesions. NECK:  Supple.  Nontender.  No audible carotid bruit.  HEART:  Appears to be regular.   LUNGS:  No crackles or wheezing audible.  Respirations even and unlabored.   RADIAL PULSE:  Equal bilaterally.  ABDOMEN:  Soft.  Nontender.  Bowel sounds present and normal.  No audible abdominal bruit.    EXTREMITIES:  No increased edema present.  DP pulses palpable and equal bilaterally.         Assessment & Plan:  CARDIOLOGY.  No chest pain or tightness.  No cardiac symptoms with increased activity or exertion.  Follow.     HEALTH MAINTENANCE.  Physical last visit.  PSA 09/12/13 - .61.  Cholesterol as outlined.  Colonoscopy 06/23/13 internal hemorrhoids.   I spent 15 minutes with the patient and more than 50% of the time was spent in consultation regarding the above.

## 2014-05-06 NOTE — Assessment & Plan Note (Signed)
Low cholesterol diet and exercise.  Intolerant to statin.  Will follow.  Remain off cholesterol medicine for now.

## 2014-06-21 ENCOUNTER — Encounter: Payer: Self-pay | Admitting: Internal Medicine

## 2014-06-21 ENCOUNTER — Ambulatory Visit (INDEPENDENT_AMBULATORY_CARE_PROVIDER_SITE_OTHER): Payer: BC Managed Care – PPO | Admitting: Internal Medicine

## 2014-06-21 VITALS — BP 130/100 | HR 98 | Temp 98.1°F | Ht 67.0 in | Wt 161.5 lb

## 2014-06-21 DIAGNOSIS — I10 Essential (primary) hypertension: Secondary | ICD-10-CM

## 2014-06-21 DIAGNOSIS — K219 Gastro-esophageal reflux disease without esophagitis: Secondary | ICD-10-CM

## 2014-06-21 DIAGNOSIS — E7800 Pure hypercholesterolemia, unspecified: Secondary | ICD-10-CM

## 2014-06-21 DIAGNOSIS — G4710 Hypersomnia, unspecified: Secondary | ICD-10-CM

## 2014-06-21 DIAGNOSIS — R400 Somnolence: Secondary | ICD-10-CM

## 2014-06-21 NOTE — Progress Notes (Signed)
Pre visit review using our clinic review tool, if applicable. No additional management support is needed unless otherwise documented below in the visit note. 

## 2014-06-24 ENCOUNTER — Encounter: Payer: Self-pay | Admitting: Internal Medicine

## 2014-06-24 NOTE — Progress Notes (Signed)
Subjective:    [name], male    DOB: Aug 30, 1960, 53 y.o.   MRN: [id number]  HPI 53 year old male with past history of hypertension, hypercholesterolemia, GERD and dizziness.  He comes in today for a scheduled follow up.  Here to f/u regarding his blood pressure.   States he was diagnosed with high blood pressure approximately 7-8 years ago.  Takes Lisinopril.  Reports blood pressure has always been higher in the doctor's office.  Has a history of dizziness.  Has had an extensive w/up.  Has seen Dr Doy Mince at [facility], Dr Carlis Abbott at [place] and a neurologist in [place].  Has had MRIs and other w/up.  Was told that his dizziness was probably a result of some underlying neck issues and pain.  Has had MRI of his neck.  Has seen Dr Sharlet Salina.  Prefers not to take medication for his neck.  He is doing exercises at home.  Feels is better with the exercise.   He does report increased fatigue.  Frequently wakes up at night.  Does not feel rested.  Feels this may be aggravating his neck symptoms and his dizziness.  Discussed sleep study.  He is agreeable.  prilosec control acid reflux.     Past Medical History  Diagnosis Date  . Hypercholesterolemia   . Hypertension     hx of HBP readings  . GERD (gastroesophageal reflux disease)   . Dizziness     Outpatient Encounter Prescriptions as of 06/21/2014  Medication Sig  . lisinopril (PRINIVIL,ZESTRIL) 30 MG tablet Take 1 tablet (30 mg total) by mouth daily.  . Multiple Vitamins-Minerals (MULTIVITAMIN PO) Take by mouth daily.  Marland Kitchen omeprazole (PRILOSEC) 20 MG capsule TAKE 1 CAPSULE (20 MG TOTAL) BY MOUTH 2 (TWO) TIMES DAILY.    Review of Systems Patient denies any headache.  Chronic light headedness/dizziness as outlined.  No chest pain, tightness or palpitations.  No increased shortness of breath, cough or congestion.  Acid reflux now controlled with prilosec.  No nausea or vomiting.  No abdominal pain or cramping.  No bowel change, such as  diarrhea, constipation, BRBPR or melana.  Bowels stable.  No urine change.   Daytime fatigue.  See above.      Objective:   Physical Exam  Filed Vitals:   06/21/14 1501  BP: 130/100  Pulse: 98  Temp: 98.1 F (36.7 C)   Blood pressure recheck:  134/84, pulse 74  53 year old male in no acute distress.  HEENT:  Nares - clear.  Oropharynx - without lesions. NECK:  Supple.  Nontender.  No audible carotid bruit.  HEART:  Appears to be regular.   LUNGS:  No crackles or wheezing audible.  Respirations even and unlabored.   RADIAL PULSE:  Equal bilaterally.  ABDOMEN:  Soft.  Nontender.  Bowel sounds present and normal.  No audible abdominal bruit.    EXTREMITIES:  No increased edema present.  DP pulses palpable and equal bilaterally.         Assessment & Plan:  CARDIOLOGY.  No chest pain or tightness.  No cardiac symptoms with increased activity or exertion.  Follow.  Daytime somnolence Wakes up at night.  Does not feel rested, etc.  Concern over possible sleep apnea.   - Ambulatory referral to Sleep Studies  Essential hypertension, benign Blood pressure better on lisinopril 30mg  q day.  Follow.    Gastroesophageal reflux disease, esophagitis presence not specified Reflux controlled.  On omeprazole.  Hypercholesterolemia Low cholesterol diet and exercise.  Follow.       HEALTH MAINTENANCE.  Physical 04/12/14.   PSA 09/12/13 - .61.   Cholesterol as outlined.  Colonoscopy 06/23/13 internal hemorrhoids.   I spent 15 minutes with the patient and more than 50% of the time was spent in consultation regarding the above.

## 2014-06-27 ENCOUNTER — Other Ambulatory Visit: Payer: Self-pay | Admitting: Internal Medicine

## 2014-09-15 ENCOUNTER — Other Ambulatory Visit: Payer: Self-pay | Admitting: Internal Medicine

## 2014-09-19 ENCOUNTER — Encounter: Payer: Self-pay | Admitting: Internal Medicine

## 2014-10-25 ENCOUNTER — Encounter: Payer: Self-pay | Admitting: Internal Medicine

## 2014-10-25 ENCOUNTER — Ambulatory Visit (INDEPENDENT_AMBULATORY_CARE_PROVIDER_SITE_OTHER): Payer: BLUE CROSS/BLUE SHIELD | Admitting: Internal Medicine

## 2014-10-25 VITALS — BP 130/90 | HR 95 | Temp 98.2°F | Ht 67.0 in | Wt 163.4 lb

## 2014-10-25 DIAGNOSIS — E7800 Pure hypercholesterolemia, unspecified: Secondary | ICD-10-CM

## 2014-10-25 DIAGNOSIS — R400 Somnolence: Secondary | ICD-10-CM

## 2014-10-25 DIAGNOSIS — G4710 Hypersomnia, unspecified: Secondary | ICD-10-CM

## 2014-10-25 DIAGNOSIS — I10 Essential (primary) hypertension: Secondary | ICD-10-CM

## 2014-10-25 DIAGNOSIS — K219 Gastro-esophageal reflux disease without esophagitis: Secondary | ICD-10-CM

## 2014-10-25 DIAGNOSIS — Z1211 Encounter for screening for malignant neoplasm of colon: Secondary | ICD-10-CM

## 2014-10-25 DIAGNOSIS — Z125 Encounter for screening for malignant neoplasm of prostate: Secondary | ICD-10-CM

## 2014-10-25 DIAGNOSIS — Z0000 Encounter for general adult medical examination without abnormal findings: Secondary | ICD-10-CM

## 2014-10-25 MED ORDER — LISINOPRIL 30 MG PO TABS: ORAL_TABLET | ORAL | Status: DC

## 2014-10-25 NOTE — Progress Notes (Signed)
Pre visit review using our clinic review tool, if applicable. No additional management support is needed unless otherwise documented below in the visit note. 

## 2014-11-02 ENCOUNTER — Other Ambulatory Visit (INDEPENDENT_AMBULATORY_CARE_PROVIDER_SITE_OTHER): Payer: BLUE CROSS/BLUE SHIELD

## 2014-11-02 DIAGNOSIS — E7800 Pure hypercholesterolemia, unspecified: Secondary | ICD-10-CM

## 2014-11-02 LAB — HEPATIC FUNCTION PANEL
ALT: 26 U/L (ref 0–53)
AST: 23 U/L (ref 0–37)
Albumin: 4.3 g/dL (ref 3.5–5.2)
Alkaline Phosphatase: 49 U/L (ref 39–117)
Bilirubin, Direct: 0.1 mg/dL (ref 0.0–0.3)
Total Bilirubin: 0.5 mg/dL (ref 0.2–1.2)
Total Protein: 7.4 g/dL (ref 6.0–8.3)

## 2014-11-03 ENCOUNTER — Encounter: Payer: Self-pay | Admitting: Internal Medicine

## 2014-11-03 DIAGNOSIS — Z0000 Encounter for general adult medical examination without abnormal findings: Secondary | ICD-10-CM | POA: Insufficient documentation

## 2014-11-03 NOTE — Assessment & Plan Note (Signed)
CPAP.  Follow.  

## 2014-11-03 NOTE — Assessment & Plan Note (Addendum)
Physical 04/12/14.  Check psa with next labs.

## 2014-11-03 NOTE — Progress Notes (Addendum)
Patient ID: MEHDI GIRONDA, male   DOB: 01-Aug-1961, 54 y.o.   MRN: [id number]   Subjective:    Patient ID: RONDRICK BARREIRA, male    DOB: [birth year], 54 y.o.   MRN: [id number]  HPI  Patient here for a scheduled follow up.  States he is doing well.  Taking his blood pressure medication.  Blood pressure doing better.  No cardiac symptoms with increased activity or exertion.  Breathing stable.  Had sleep study - CPAP 6.     Past Medical History  Diagnosis Date  . Hypercholesterolemia   . Hypertension     hx of HBP readings  . GERD (gastroesophageal reflux disease)   . Dizziness     Outpatient Encounter Prescriptions as of 10/25/2014  Medication Sig  . lisinopril (PRINIVIL,ZESTRIL) 30 MG tablet TAKE 1 TABLET (30 MG TOTAL) BY MOUTH DAILY.  . Multiple Vitamins-Minerals (MULTIVITAMIN PO) Take by mouth daily.  Marland Kitchen omeprazole (PRILOSEC) 20 MG capsule TAKE ONE CAPSULE BY MOUTH TWICE A DAY  . [DISCONTINUED] lisinopril (PRINIVIL,ZESTRIL) 30 MG tablet TAKE 1 TABLET (30 MG TOTAL) BY MOUTH DAILY.    Review of Systems  Constitutional: Negative for appetite change and unexpected weight change.  HENT: Negative for congestion and sinus pressure.   Respiratory: Negative for cough, chest tightness and shortness of breath.   Cardiovascular: Negative for chest pain, palpitations and leg swelling.  Gastrointestinal: Negative for nausea, vomiting, abdominal pain and diarrhea.  Neurological: Negative for dizziness, light-headedness and headaches.       Objective:     Blood pressure recheck: 136/82, pulse 84  Physical Exam  Constitutional: He appears well-developed and well-nourished. No distress.  HENT:  Nose: Nose normal.  Mouth/Throat: Oropharynx is clear and moist.  Neck: Neck supple. No thyromegaly present.  Cardiovascular: Normal rate and regular rhythm.   Pulmonary/Chest: Effort normal and breath sounds normal. No respiratory distress.  Abdominal: Soft. Bowel sounds are normal. There is no  tenderness.  Musculoskeletal: He exhibits no edema.  Lymphadenopathy:    He has no cervical adenopathy.  Psychiatric: He has a normal mood and affect. His behavior is normal.    BP 130/90 mmHg  Pulse 95  Temp(Src) 98.2 F (36.8 C) (Oral)  Ht $R'5\' 7"'YX$  (1.702 m)  Wt 163 lb 6 oz (74.106 kg)  BMI 25.58 kg/m2  SpO2 96% Wt Readings from Last 3 Encounters:  10/25/14 163 lb 6 oz (74.106 kg)  06/21/14 161 lb 8 oz (73.256 kg)  05/03/14 158 lb 8 oz (71.895 kg)     Lab Results  Component Value Date   WBC 7.9 04/20/2014   HGB 13.5 04/20/2014   HCT 40.4 04/20/2014   PLT 353.0 04/20/2014   GLUCOSE 97 04/20/2014   CHOL 277* 04/20/2014   TRIG 190.0* 04/20/2014   HDL 37.30* 04/20/2014   LDLDIRECT 202.8 01/17/2013   LDLCALC 202* 04/20/2014   ALT 26 11/02/2014   AST 23 11/02/2014   NA 137 04/20/2014   K 4.2 04/20/2014   CL 104 04/20/2014   CREATININE 0.9 04/20/2014   BUN 17 04/20/2014   CO2 29 04/20/2014   TSH 1.14 04/20/2014   PSA 0.61 09/12/2013       Assessment & Plan:   Problem List Items Addressed This Visit    Daytime somnolence    CPAP.  Follow.        Essential hypertension, benign - Primary    Blood pressure doing better.  Same medication regimen.  Follow met b.  Relevant Medications   lisinopril (PRINIVIL,ZESTRIL) tablet   Other Relevant Orders   Basic metabolic panel   GERD (gastroesophageal reflux disease)    Addendum.  Pt with acid reflux - needing omeprazole.  Will refer to GI for evaluation and question of need for EGD.        Health care maintenance    Physical 04/12/14.  Check psa with next labs.  Schedule for colonoscopy.  Has never had colonoscopy.        Hypercholesterolemia    Low cholesterol diet and exercise.  He declines cholesterol medication.  Recheck lipid panel with next labs.        Relevant Medications   lisinopril (PRINIVIL,ZESTRIL) tablet   Other Relevant Orders   Lipid panel    Other Visit Diagnoses    Prostate cancer  screening        Relevant Orders    PSA    Colon cancer screening        Relevant Orders    Ambulatory referral to Gastroenterology      Addendum.  Correction.  GI referral was for evaluation for possible EGD not for colon cancer screening.  Pt had colonoscopy 2014.    Einar Pheasant, MD

## 2014-11-03 NOTE — Assessment & Plan Note (Signed)
Low cholesterol diet and exercise.  He declines cholesterol medication.  Recheck lipid panel with next labs.

## 2014-11-03 NOTE — Assessment & Plan Note (Signed)
Blood pressure doing better.  Same medication regimen.  Follow met b.

## 2014-11-03 NOTE — Assessment & Plan Note (Addendum)
Addendum.  Pt with acid reflux - needing omeprazole.  Will refer to GI for evaluation and question of need for EGD.

## 2014-11-12 ENCOUNTER — Telehealth: Payer: Self-pay | Admitting: *Deleted

## 2014-11-12 NOTE — Telephone Encounter (Signed)
Copy of message given to [facility]

## 2014-11-12 NOTE — Telephone Encounter (Signed)
Ok.  Note corrected.  Please inform Bonnita Nasuti to schedule for evaluation for possible EGD.  Do I need to put another order for referral?  Just let me know if so.   Thanks.

## 2014-11-12 NOTE — Telephone Encounter (Signed)
Dr. Nicki Reaper was suppose to be scheduled for an upper GI for his acid reflux issues. And Bonnita Nasuti states that an order for Colonoscopy was placed instead. Pt had Colonoscopy in 2014 & it was documented to repeat screening in 5-10 years. Please advise.

## 2014-11-22 ENCOUNTER — Other Ambulatory Visit: Payer: Self-pay | Admitting: Internal Medicine

## 2014-11-22 DIAGNOSIS — K219 Gastro-esophageal reflux disease without esophagitis: Secondary | ICD-10-CM

## 2014-11-22 NOTE — Progress Notes (Signed)
Orders placed for  Referral to GI.

## 2015-02-07 DIAGNOSIS — R079 Chest pain, unspecified: Secondary | ICD-10-CM | POA: Insufficient documentation

## 2015-03-16 ENCOUNTER — Other Ambulatory Visit: Payer: Self-pay | Admitting: Internal Medicine

## 2015-04-18 ENCOUNTER — Ambulatory Visit (INDEPENDENT_AMBULATORY_CARE_PROVIDER_SITE_OTHER): Payer: BLUE CROSS/BLUE SHIELD | Admitting: Internal Medicine

## 2015-04-18 ENCOUNTER — Encounter (INDEPENDENT_AMBULATORY_CARE_PROVIDER_SITE_OTHER): Payer: Self-pay

## 2015-04-18 ENCOUNTER — Encounter: Payer: Self-pay | Admitting: Internal Medicine

## 2015-04-18 VITALS — BP 130/90 | HR 88 | Temp 98.4°F | Ht 67.0 in | Wt 161.0 lb

## 2015-04-18 DIAGNOSIS — K219 Gastro-esophageal reflux disease without esophagitis: Secondary | ICD-10-CM | POA: Diagnosis not present

## 2015-04-18 DIAGNOSIS — G4710 Hypersomnia, unspecified: Secondary | ICD-10-CM | POA: Diagnosis not present

## 2015-04-18 DIAGNOSIS — Z0000 Encounter for general adult medical examination without abnormal findings: Secondary | ICD-10-CM

## 2015-04-18 DIAGNOSIS — I10 Essential (primary) hypertension: Secondary | ICD-10-CM | POA: Diagnosis not present

## 2015-04-18 DIAGNOSIS — E7800 Pure hypercholesterolemia, unspecified: Secondary | ICD-10-CM

## 2015-04-18 DIAGNOSIS — R400 Somnolence: Secondary | ICD-10-CM

## 2015-04-18 MED ORDER — OMEPRAZOLE 20 MG PO CPDR: 20.0000 mg | DELAYED_RELEASE_CAPSULE | Freq: Two times a day (BID) | ORAL | Status: DC

## 2015-04-18 MED ORDER — LISINOPRIL 30 MG PO TABS: ORAL_TABLET | ORAL | Status: DC

## 2015-04-18 NOTE — Progress Notes (Signed)
Pre-visit discussion using our clinic review tool. No additional management support is needed unless otherwise documented below in the visit note.  

## 2015-04-18 NOTE — Progress Notes (Signed)
[name], male   DOB: 07-24-1961, 54 y.o.   MRN: [id number]   Subjective:    [name], male    DOB: 03-18-1961, 54 y.o.   MRN: [id number]  HPI  Patient here to follow up on her current medical issues as well as for a complete physical exam.  He is staying active.  Reports no cardiac symptoms with increased activity or exertion.  No sob.  Taking medication.  Acid reflux is better.  Saw GI.  Needs EGD.  Was not scheduled.  No abdominal pain or cramping.  Bowels stable.  Blood pressures have been doing well.  Handling stress well.     Past Medical History  Diagnosis Date  . Hypercholesterolemia   . Hypertension     hx of HBP readings  . GERD (gastroesophageal reflux disease)   . Dizziness    No past surgical history on file. Family History  Problem Relation Age of Onset  . Hyperlipidemia Mother   . Heart disease Mother   . Hypertension Mother   . Alzheimer's disease Mother    Social History   Social History  . Marital Status: Married    Spouse Name: N/A  . Number of Children: N/A  . Years of Education: N/A   Social History Main Topics  . Smoking status: Former Research scientist (life sciences)  . Smokeless tobacco: Never Used  . Alcohol Use: 0.0 oz/week    0 Standard drinks or equivalent per week  . Drug Use: No  . Sexual Activity: Not Asked   Other Topics Concern  . None   Social History Narrative    Outpatient Encounter Prescriptions as of 04/18/2015  Medication Sig  . lisinopril (PRINIVIL,ZESTRIL) 30 MG tablet TAKE 1 TABLET (30 MG TOTAL) BY MOUTH DAILY.  . Multiple Vitamins-Minerals (MULTIVITAMIN PO) Take by mouth daily.  Marland Kitchen omeprazole (PRILOSEC) 20 MG capsule Take 1 capsule (20 mg total) by mouth 2 (two) times daily.  . [DISCONTINUED] lisinopril (PRINIVIL,ZESTRIL) 30 MG tablet TAKE 1 TABLET (30 MG TOTAL) BY MOUTH DAILY.  . [DISCONTINUED] omeprazole (PRILOSEC) 20 MG capsule TAKE ONE CAPSULE BY MOUTH TWICE A DAY   No facility-administered encounter  medications on file as of 04/18/2015.    Review of Systems  Constitutional: Positive for fatigue. Negative for appetite change and unexpected weight change.  HENT: Negative for congestion and sinus pressure.   Eyes: Negative for pain and visual disturbance.  Respiratory: Negative for cough, chest tightness and shortness of breath.   Cardiovascular: Negative for chest pain, palpitations and leg swelling.  Gastrointestinal: Negative for nausea, vomiting, abdominal pain and diarrhea.  Genitourinary: Negative for dysuria and difficulty urinating.  Musculoskeletal: Negative for back pain and joint swelling.  Skin: Negative for color change and rash.  Neurological: Negative for dizziness, light-headedness and headaches.  Hematological: Negative for adenopathy. Does not bruise/bleed easily.  Psychiatric/Behavioral: Negative for dysphoric mood and agitation.       Objective:     Blood pressure rechecked by me:  132/82  Physical Exam  Constitutional: He is oriented to person, place, and time. He appears well-developed and well-nourished. No distress.  HENT:  Head: Normocephalic and atraumatic.  Nose: Nose normal.  Mouth/Throat: Oropharynx is clear and moist. No oropharyngeal exudate.  Eyes: Conjunctivae are normal. Right eye exhibits no discharge. Left eye exhibits no discharge.  Neck: Neck supple. No thyromegaly present.  Cardiovascular: Normal rate and regular rhythm.   Pulmonary/Chest: Breath sounds normal. No respiratory distress. He has no wheezes.  Abdominal: Soft. Bowel sounds are normal. There is no tenderness.  Genitourinary:  Rectal exam - no palpable prostate nodules.  Heme negative.    Musculoskeletal: He exhibits no edema or tenderness.  Lymphadenopathy:    He has no cervical adenopathy.  Neurological: He is alert and oriented to person, place, and time.  Skin: Skin is warm and dry. No rash noted.  Psychiatric: He has a normal mood and affect. His behavior is normal.     BP 130/90 mmHg  Pulse 88  Temp(Src) 98.4 F (36.9 C) (Oral)  Ht 5\' 7"  (1.702 m)  Wt 161 lb (73.029 kg)  BMI 25.21 kg/m2  SpO2 96% Wt Readings from Last 3 Encounters:  04/18/15 161 lb (73.029 kg)  10/25/14 163 lb 6 oz (74.106 kg)  06/21/14 161 lb 8 oz (73.256 kg)     Lab Results  Component Value Date   WBC 7.9 04/20/2014   HGB 13.5 04/20/2014   HCT 40.4 04/20/2014   PLT 353.0 04/20/2014   GLUCOSE 97 04/20/2014   CHOL 277* 04/20/2014   TRIG 190.0* 04/20/2014   HDL 37.30* 04/20/2014   LDLDIRECT 202.8 01/17/2013   LDLCALC 202* 04/20/2014   ALT 26 11/02/2014   AST 23 11/02/2014   NA 137 04/20/2014   K 4.2 04/20/2014   CL 104 04/20/2014   CREATININE 0.9 04/20/2014   BUN 17 04/20/2014   CO2 29 04/20/2014   TSH 1.14 04/20/2014   PSA 0.61 09/12/2013       Assessment & Plan:   Problem List Items Addressed This Visit    Daytime somnolence    Continue cpap.  Follow.       Relevant Orders   TSH   Essential hypertension, benign - Primary    Blood pressure under good control.  Continue same medication regimen.  Follow pressures.  Follow metabolic panel.        Relevant Medications   lisinopril (PRINIVIL,ZESTRIL) 30 MG tablet   Other Relevant Orders   CBC with Differential/Platelet   GERD (gastroesophageal reflux disease)    On omeprazole bid now.  Symptoms better.  Saw GI.  Needs EGD.  Need to f/u with them to schedule.        Relevant Medications   omeprazole (PRILOSEC) 20 MG capsule   Health care maintenance    Physical today 04/18/15.  Check psa with next labs.  Colonoscopy 06/23/13 - internal hemorrhoids.  Recommended f/u colonoscopy in 5-10 years.       Hypercholesterolemia    Low cholesterol diet and exercise.  Follow lipid panel.       Relevant Medications   lisinopril (PRINIVIL,ZESTRIL) 30 MG tablet   Other Relevant Orders   Hepatic function panel       Timothy Pheasant, MD

## 2015-04-22 ENCOUNTER — Encounter: Payer: Self-pay | Admitting: Internal Medicine

## 2015-04-22 NOTE — Assessment & Plan Note (Signed)
On omeprazole bid now.  Symptoms better.  Saw GI.  Needs EGD.  Need to f/u with them to schedule.

## 2015-04-22 NOTE — Assessment & Plan Note (Signed)
Blood pressure under good control.  Continue same medication regimen.  Follow pressures.  Follow metabolic panel.   

## 2015-04-22 NOTE — Assessment & Plan Note (Signed)
Continue cpap.  Follow.  

## 2015-04-22 NOTE — Assessment & Plan Note (Signed)
Low cholesterol diet and exercise.  Follow lipid panel.   

## 2015-04-22 NOTE — Assessment & Plan Note (Addendum)
Physical today 04/18/15.  Check psa with next labs.  Colonoscopy 06/23/13 - internal hemorrhoids.  Recommended f/u colonoscopy in 5-10 years.

## 2015-05-10 ENCOUNTER — Other Ambulatory Visit (INDEPENDENT_AMBULATORY_CARE_PROVIDER_SITE_OTHER): Payer: BLUE CROSS/BLUE SHIELD

## 2015-05-10 DIAGNOSIS — Z125 Encounter for screening for malignant neoplasm of prostate: Secondary | ICD-10-CM | POA: Diagnosis not present

## 2015-05-10 DIAGNOSIS — I10 Essential (primary) hypertension: Secondary | ICD-10-CM | POA: Diagnosis not present

## 2015-05-10 DIAGNOSIS — G4710 Hypersomnia, unspecified: Secondary | ICD-10-CM | POA: Diagnosis not present

## 2015-05-10 DIAGNOSIS — E7800 Pure hypercholesterolemia, unspecified: Secondary | ICD-10-CM

## 2015-05-10 DIAGNOSIS — R400 Somnolence: Secondary | ICD-10-CM

## 2015-05-10 LAB — LIPID PANEL
CHOL/HDL RATIO: 7
CHOLESTEROL: 289 mg/dL — AB (ref 0–200)
HDL: 39.4 mg/dL (ref >39.00)
NONHDL: 249.35
TRIGLYCERIDES: 254 mg/dL — AB (ref 0.0–149.0)
VLDL: 50.8 mg/dL — ABNORMAL HIGH (ref 0.0–40.0)

## 2015-05-10 LAB — CBC WITH DIFFERENTIAL/PLATELET
BASOS ABS: 0 10*3/uL (ref 0.0–0.1)
Basophils Relative: 0.6 % (ref 0.0–3.0)
EOS ABS: 0.2 10*3/uL (ref 0.0–0.7)
Eosinophils Relative: 3.2 % (ref 0.0–5.0)
HCT: 41.8 % (ref 39.0–52.0)
HEMOGLOBIN: 13.8 g/dL (ref 13.0–17.0)
LYMPHS ABS: 2.8 10*3/uL (ref 0.7–4.0)
Lymphocytes Relative: 36.7 % (ref 12.0–46.0)
MCHC: 32.9 g/dL (ref 30.0–36.0)
MCV: 83.9 fl (ref 78.0–100.0)
MONO ABS: 0.7 10*3/uL (ref 0.1–1.0)
Monocytes Relative: 8.6 % (ref 3.0–12.0)
NEUTROS PCT: 50.9 % (ref 43.0–77.0)
Neutro Abs: 3.9 10*3/uL (ref 1.4–7.7)
Platelets: 424 10*3/uL — ABNORMAL HIGH (ref 150.0–400.0)
RBC: 4.99 Mil/uL (ref 4.22–5.81)
RDW: 14.3 % (ref 11.5–15.5)
WBC: 7.7 10*3/uL (ref 4.0–10.5)

## 2015-05-10 LAB — HEPATIC FUNCTION PANEL
ALBUMIN: 4.4 g/dL (ref 3.5–5.2)
ALT: 25 U/L (ref 0–53)
AST: 21 U/L (ref 0–37)
Alkaline Phosphatase: 58 U/L (ref 39–117)
BILIRUBIN TOTAL: 0.4 mg/dL (ref 0.2–1.2)
Bilirubin, Direct: 0.1 mg/dL (ref 0.0–0.3)
Total Protein: 7.5 g/dL (ref 6.0–8.3)

## 2015-05-10 LAB — LDL CHOLESTEROL, DIRECT: LDL DIRECT: 203 mg/dL

## 2015-05-10 LAB — BASIC METABOLIC PANEL
BUN: 17 mg/dL (ref 6–23)
CHLORIDE: 99 meq/L (ref 96–112)
CO2: 28 mEq/L (ref 19–32)
CREATININE: 0.97 mg/dL (ref 0.40–1.50)
Calcium: 9.8 mg/dL (ref 8.4–10.5)
GFR: 85.45 mL/min (ref >60.00)
GLUCOSE: 98 mg/dL (ref 70–99)
POTASSIUM: 5.2 meq/L — AB (ref 3.5–5.1)
Sodium: 135 mEq/L (ref 135–145)

## 2015-05-10 LAB — PSA: PSA: 0.77 ng/mL (ref 0.10–4.00)

## 2015-05-10 LAB — TSH: TSH: 1.14 u[IU]/mL (ref 0.35–4.50)

## 2015-05-11 ENCOUNTER — Other Ambulatory Visit: Payer: Self-pay | Admitting: Internal Medicine

## 2015-05-11 DIAGNOSIS — D473 Essential (hemorrhagic) thrombocythemia: Secondary | ICD-10-CM

## 2015-05-11 DIAGNOSIS — E875 Hyperkalemia: Secondary | ICD-10-CM

## 2015-05-11 DIAGNOSIS — D75839 Thrombocytosis, unspecified: Secondary | ICD-10-CM

## 2015-05-11 NOTE — Progress Notes (Signed)
Order placed for f/u labs.  

## 2015-05-13 ENCOUNTER — Encounter: Payer: Self-pay | Admitting: *Deleted

## 2015-05-17 ENCOUNTER — Other Ambulatory Visit (INDEPENDENT_AMBULATORY_CARE_PROVIDER_SITE_OTHER): Payer: BLUE CROSS/BLUE SHIELD

## 2015-05-17 DIAGNOSIS — D75839 Thrombocytosis, unspecified: Secondary | ICD-10-CM

## 2015-05-17 DIAGNOSIS — E875 Hyperkalemia: Secondary | ICD-10-CM | POA: Diagnosis not present

## 2015-05-17 DIAGNOSIS — D473 Essential (hemorrhagic) thrombocythemia: Secondary | ICD-10-CM

## 2015-05-17 LAB — PLATELET COUNT: Platelets: 401 10*3/uL — ABNORMAL HIGH (ref 150–400)

## 2015-05-17 LAB — POTASSIUM: POTASSIUM: 4.6 meq/L (ref 3.5–5.1)

## 2015-05-18 ENCOUNTER — Other Ambulatory Visit: Payer: Self-pay | Admitting: *Deleted

## 2015-05-18 MED ORDER — ROSUVASTATIN CALCIUM 10 MG PO TABS: 10.0000 mg | ORAL_TABLET | Freq: Every day | ORAL | Status: DC

## 2015-05-19 ENCOUNTER — Encounter: Payer: Self-pay | Admitting: Internal Medicine

## 2015-05-19 ENCOUNTER — Other Ambulatory Visit: Payer: Self-pay | Admitting: Internal Medicine

## 2015-05-19 DIAGNOSIS — Z862 Personal history of diseases of the blood and blood-forming organs and certain disorders involving the immune mechanism: Secondary | ICD-10-CM

## 2015-05-19 NOTE — Progress Notes (Signed)
Order placed for f/u platelet count.  

## 2015-05-21 NOTE — Telephone Encounter (Signed)
Unread mychart message mailed to patient 

## 2015-05-24 ENCOUNTER — Ambulatory Visit: Payer: BLUE CROSS/BLUE SHIELD | Admitting: Anesthesiology

## 2015-05-24 ENCOUNTER — Encounter: Payer: Self-pay | Admitting: *Deleted

## 2015-05-24 ENCOUNTER — Encounter
Admission: RE | Disposition: A | Discharge status: Home / Self Care | Payer: Self-pay | Source: Ambulatory Visit | Attending: Unknown Physician Specialty

## 2015-05-24 ENCOUNTER — Ambulatory Visit
Admission: RE | Admit: 2015-05-24 | Discharge: 2015-05-24 | Disposition: A | Discharge status: Home / Self Care | Payer: BLUE CROSS/BLUE SHIELD | Source: Ambulatory Visit | Attending: Unknown Physician Specialty | Admitting: Unknown Physician Specialty

## 2015-05-24 DIAGNOSIS — R42 Dizziness and giddiness: Secondary | ICD-10-CM | POA: Insufficient documentation

## 2015-05-24 DIAGNOSIS — K219 Gastro-esophageal reflux disease without esophagitis: Secondary | ICD-10-CM | POA: Insufficient documentation

## 2015-05-24 DIAGNOSIS — Z79899 Other long term (current) drug therapy: Secondary | ICD-10-CM | POA: Diagnosis not present

## 2015-05-24 DIAGNOSIS — R1310 Dysphagia, unspecified: Secondary | ICD-10-CM | POA: Insufficient documentation

## 2015-05-24 DIAGNOSIS — E7800 Pure hypercholesterolemia, unspecified: Secondary | ICD-10-CM | POA: Diagnosis not present

## 2015-05-24 DIAGNOSIS — K222 Esophageal obstruction: Secondary | ICD-10-CM | POA: Diagnosis not present

## 2015-05-24 DIAGNOSIS — Z8249 Family history of ischemic heart disease and other diseases of the circulatory system: Secondary | ICD-10-CM | POA: Diagnosis not present

## 2015-05-24 DIAGNOSIS — Z87891 Personal history of nicotine dependence: Secondary | ICD-10-CM | POA: Diagnosis not present

## 2015-05-24 DIAGNOSIS — Z820 Family history of epilepsy and other diseases of the nervous system: Secondary | ICD-10-CM | POA: Diagnosis not present

## 2015-05-24 DIAGNOSIS — I10 Essential (primary) hypertension: Secondary | ICD-10-CM | POA: Insufficient documentation

## 2015-05-24 HISTORY — PX: ESOPHAGOGASTRODUODENOSCOPY (EGD) WITH PROPOFOL: SHX5813

## 2015-05-24 HISTORY — PX: SAVORY DILATION: SHX5439

## 2015-05-24 SURGERY — ESOPHAGOGASTRODUODENOSCOPY (EGD) WITH PROPOFOL
Anesthesia: General

## 2015-05-24 MED ORDER — LIDOCAINE HCL (CARDIAC) 20 MG/ML IV SOLN
INTRAVENOUS | Status: DC | PRN
  Administered 2015-05-24: 30 mg via INTRAVENOUS

## 2015-05-24 MED ORDER — FENTANYL CITRATE (PF) 100 MCG/2ML IJ SOLN
INTRAMUSCULAR | Status: DC | PRN
  Administered 2015-05-24: 50 ug via INTRAVENOUS

## 2015-05-24 MED ORDER — ALFENTANIL 500 MCG/ML IJ INJ
INJECTION | INTRAMUSCULAR | Status: DC | PRN
  Administered 2015-05-24: 500 ug via INTRAVENOUS

## 2015-05-24 MED ORDER — MIDAZOLAM HCL 2 MG/2ML IJ SOLN
INTRAMUSCULAR | Status: DC | PRN
  Administered 2015-05-24: 1 mg via INTRAVENOUS

## 2015-05-24 MED ORDER — SODIUM CHLORIDE 0.9 % IV SOLN
INTRAVENOUS | Status: DC
  Administered 2015-05-24: 1000 mL via INTRAVENOUS

## 2015-05-24 MED ORDER — GLYCOPYRROLATE 0.2 MG/ML IJ SOLN
INTRAMUSCULAR | Status: DC | PRN
  Administered 2015-05-24: 0.2 mg via INTRAVENOUS

## 2015-05-24 MED ORDER — PROPOFOL 10 MG/ML IV BOLUS
INTRAVENOUS | Status: DC | PRN
Start: 2015-05-24 — End: 2015-05-24
  Administered 2015-05-24: 50 mg via INTRAVENOUS
  Administered 2015-05-24 (×2): 20 mg via INTRAVENOUS
  Administered 2015-05-24: 30 mg via INTRAVENOUS

## 2015-05-24 MED ORDER — SODIUM CHLORIDE 0.9 % IV SOLN: INTRAVENOUS | Status: DC

## 2015-05-24 MED ORDER — PROPOFOL 500 MG/50ML IV EMUL
INTRAVENOUS | Status: DC | PRN
  Administered 2015-05-24: 140 ug/kg/min via INTRAVENOUS

## 2015-05-24 NOTE — Op Note (Signed)
[facility] Patient Name: Timothy Garrison Procedure Date: 05/24/2015 1:27 PM MRN: [id number] Account #: [id number] Date of Birth: 08-02-1961 Admit Type: Outpatient Age: 54 Room: [facility] ENDO ROOM 1 Gender: Male Note Status: Finalized Procedure:         Upper GI endoscopy Indications:       Dysphagia Providers:         Manya Silvas, MD Referring MD:      Einar Pheasant, MD (Referring MD) Medicines:         Propofol per Anesthesia Complications:     No immediate complications. Procedure:         Pre-Anesthesia Assessment:                    - After reviewing the risks and benefits, the patient was                     deemed in satisfactory condition to undergo the procedure.                    After obtaining informed consent, the endoscope was passed                     under direct vision. Throughout the procedure, the                     patient's blood pressure, pulse, and oxygen saturations                     were monitored continuously. The Endoscope was introduced                     through the mouth, and advanced to the second part of                     duodenum. The upper GI endoscopy was accomplished without                     difficulty. The patient tolerated the procedure well. Findings:      A mild Schatzki ring (acquired) was found at the gastroesophageal       junction. A guidewire was placed and the scope was withdrawn. Dilation       was performed with a Savary dilator with mild resistance at 16 mm and 17       mm.      The entire examined stomach was normal. Biopsies were taken with a cold       forceps for Helicobacter pylori testing. Done due to mild edema in       proximal folds in stomach.      The examined duodenum was normal. Impression:        - Mild Schatzki ring. Dilated.                    - Normal stomach. Biopsied.                    - Normal examined duodenum. Recommendation:    - Await pathology  results. Manya Silvas, MD 05/24/2015 1:44:25 PM This report has been signed electronically. Number of Addenda: 0 Note Initiated On: 05/24/2015 1:27 PM      [facility]

## 2015-05-24 NOTE — Anesthesia Preprocedure Evaluation (Signed)
Anesthesia Evaluation  Patient identified by MRN, date of birth, ID band Patient awake    Reviewed: Allergy & Precautions, H&P , NPO status , Patient's Chart, lab work & pertinent test results  History of Anesthesia Complications Negative for: history of anesthetic complications  Airway Mallampati: II  TM Distance: >3 FB Neck ROM: full    Dental no notable dental hx. (+) Teeth Intact   Pulmonary former smoker,    Pulmonary exam normal breath sounds clear to auscultation       Cardiovascular Exercise Tolerance: Good hypertension, Normal cardiovascular exam Rhythm:regular Rate:Normal     Neuro/Psych negative neurological ROS  negative psych ROS   GI/Hepatic Neg liver ROS, GERD  Controlled,  Endo/Other  negative endocrine ROS  Renal/GU negative Renal ROS  negative genitourinary   Musculoskeletal   Abdominal   Peds  Hematology negative hematology ROS (+)   Anesthesia Other Findings Past Medical History:   Hypercholesterolemia                                         Hypertension                                                   Comment:hx of HBP readings   GERD (gastroesophageal reflux disease)                       Dizziness                                                   BMI    Body Mass Index   25.05 kg/m 2      Reproductive/Obstetrics negative OB ROS                             Anesthesia Physical Anesthesia Plan  ASA: III  Anesthesia Plan: General   Post-op Pain Management:    Induction:   Airway Management Planned:   Additional Equipment:   Intra-op Plan:   Post-operative Plan:   Informed Consent: I have reviewed the patients History and Physical, chart, labs and discussed the procedure including the risks, benefits and alternatives for the proposed anesthesia with the patient or authorized representative who has indicated his/her understanding and acceptance.    Dental Advisory Given  Plan Discussed with: Anesthesiologist, CRNA and Surgeon  Anesthesia Plan Comments:         Anesthesia Quick Evaluation

## 2015-05-24 NOTE — Anesthesia Procedure Notes (Signed)
Date/Time: 05/24/2015 1:27 PM Performed by: Doreen Salvage Pre-anesthesia Checklist: Patient identified, Emergency Drugs available, Suction available and Patient being monitored Patient Re-evaluated:Patient Re-evaluated prior to inductionOxygen Delivery Method: Nasal cannula Intubation Type: IV induction Dental Injury: Teeth and Oropharynx as per pre-operative assessment  Comments: Nasal cannula with etCO2 monitoring

## 2015-05-24 NOTE — Transfer of Care (Signed)
Immediate Anesthesia Transfer of Care Note  Patient: Timothy Garrison  Procedure(s) Performed: Procedure(s): ESOPHAGOGASTRODUODENOSCOPY (EGD) WITH PROPOFOL (N/A) SAVORY DILATION (N/A)  Patient Location: PACU and Endoscopy Unit  Anesthesia Type:General  Level of Consciousness: sedated  Airway & Oxygen Therapy: Patient Spontanous Breathing and Patient connected to nasal cannula oxygen  Post-op Assessment: Report given to RN and Post -op Vital signs reviewed and stable  Post vital signs: Reviewed and stable  Last Vitals:  Filed Vitals:   05/24/15 1348  BP: 114/75  Pulse:   Temp: 36.3 C  Resp: 18    Complications: No apparent anesthesia complications

## 2015-05-24 NOTE — H&P (Signed)
   Primary Care Physician:  Einar Pheasant, MD Primary Gastroenterologist:  Dr. Vira Agar  Pre-Procedure History & Physical: HPI:  Timothy Garrison is a 54 y.o. male is here for an endoscopy.   Past Medical History  Diagnosis Date  . Hypercholesterolemia   . Hypertension     hx of HBP readings  . GERD (gastroesophageal reflux disease)   . Dizziness     Past Surgical History  Procedure Laterality Date  . Colonoscopy      Prior to Admission medications   Medication Sig Start Date End Date Taking? Authorizing Provider  lisinopril (PRINIVIL,ZESTRIL) 30 MG tablet TAKE 1 TABLET (30 MG TOTAL) BY MOUTH DAILY. 04/18/15  Yes Einar Pheasant, MD  Multiple Vitamins-Minerals (MULTIVITAMIN PO) Take by mouth daily.   Yes Historical Provider, MD  omeprazole (PRILOSEC) 20 MG capsule Take 1 capsule (20 mg total) by mouth 2 (two) times daily. 04/18/15  Yes Einar Pheasant, MD  rosuvastatin (CRESTOR) 10 MG tablet Take 1 tablet (10 mg total) by mouth daily. Patient not taking: Reported on 05/24/2015 05/18/15   Einar Pheasant, MD    Allergies as of 04/30/2015  . (No Known Allergies)    Family History  Problem Relation Age of Onset  . Hyperlipidemia Mother   . Heart disease Mother   . Hypertension Mother   . Alzheimer's disease Mother     Social History   Social History  . Marital Status: Married    Spouse Name: N/A  . Number of Children: N/A  . Years of Education: N/A   Occupational History  . Not on file.   Social History Main Topics  . Smoking status: Former Research scientist (life sciences)  . Smokeless tobacco: Never Used  . Alcohol Use: 0.0 oz/week    0 Standard drinks or equivalent per week  . Drug Use: No  . Sexual Activity: Not on file   Other Topics Concern  . Not on file   Social History Narrative    Review of Systems: See HPI, otherwise negative ROS  Physical Exam: BP 164/95 mmHg  Pulse 88  Temp(Src) 97.8 F (36.6 C) (Tympanic)  Resp 18  Ht 5\' 7"  (1.702 m)  Wt 72.576 kg (160 lb)  BMI  25.05 kg/m2  SpO2 100% General:   Alert,  pleasant and cooperative in NAD Head:  Normocephalic and atraumatic. Neck:  Supple; no masses or thyromegaly. Lungs:  Clear throughout to auscultation.    Heart:  Regular rate and rhythm. Abdomen:  Soft, nontender and nondistended. Normal bowel sounds, without guarding, and without rebound.   Neurologic:  Alert and  oriented x4;  grossly normal neurologically.  Impression/Plan: Timothy Garrison is here for an endoscopy to be performed for dysphagia  Risks, benefits, limitations, and alternatives regarding  endoscopy have been reviewed with the patient.  Questions have been answered.  All parties agreeable.   Gaylyn Cheers, MD  05/24/2015, 1:26 PM

## 2015-05-27 ENCOUNTER — Encounter: Payer: Self-pay | Admitting: Unknown Physician Specialty

## 2015-05-27 LAB — SURGICAL PATHOLOGY

## 2015-05-29 NOTE — Anesthesia Postprocedure Evaluation (Signed)
  Anesthesia Post-op Note  Patient: Timothy Garrison  Procedure(s) Performed: Procedure(s): ESOPHAGOGASTRODUODENOSCOPY (EGD) WITH PROPOFOL (N/A) SAVORY DILATION (N/A)  Anesthesia type:General  Patient location: PACU  Post pain: Pain level controlled  Post assessment: Post-op Vital signs reviewed, Patient's Cardiovascular Status Stable, Respiratory Function Stable, Patent Airway and No signs of Nausea or vomiting  Post vital signs: Reviewed and stable  Last Vitals:  Filed Vitals:   05/24/15 1420  BP: 153/91  Pulse: 81  Temp:   Resp: 17    Level of consciousness: awake, alert  and patient cooperative  Complications: No apparent anesthesia complications

## 2015-07-02 ENCOUNTER — Other Ambulatory Visit: Payer: BLUE CROSS/BLUE SHIELD

## 2015-10-17 ENCOUNTER — Ambulatory Visit (INDEPENDENT_AMBULATORY_CARE_PROVIDER_SITE_OTHER): Payer: BLUE CROSS/BLUE SHIELD | Admitting: Internal Medicine

## 2015-10-17 ENCOUNTER — Encounter: Payer: Self-pay | Admitting: Internal Medicine

## 2015-10-17 VITALS — BP 140/90 | HR 88 | Temp 98.1°F | Resp 18 | Ht 67.0 in | Wt 163.5 lb

## 2015-10-17 DIAGNOSIS — E7800 Pure hypercholesterolemia, unspecified: Secondary | ICD-10-CM

## 2015-10-17 DIAGNOSIS — I10 Essential (primary) hypertension: Secondary | ICD-10-CM

## 2015-10-17 DIAGNOSIS — M542 Cervicalgia: Secondary | ICD-10-CM | POA: Diagnosis not present

## 2015-10-17 DIAGNOSIS — K219 Gastro-esophageal reflux disease without esophagitis: Secondary | ICD-10-CM

## 2015-10-17 NOTE — Progress Notes (Signed)
Pre-visit discussion using our clinic review tool. No additional management support is needed unless otherwise documented below in the visit note.  

## 2015-10-17 NOTE — Progress Notes (Signed)
[name], male   DOB: 1960/12/16, 55 y.o.   MRN: [id number]   Subjective:    [name], male    DOB: 10/17/1960, 55 y.o.   MRN: [id number]  HPI  Patient with past history of hypercholesterolemia, GERD and hypertension.  He comes in today to follow up on these issues.  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  Discussed with him today.  Feels he is handling things relatively well.  Tries to stay active.  Is exercising some.  No cardiac symptoms with increased activity or exertion.  Breathing stable.  No abdominal pain or cramping.  Bowels stable.  On omeprazole.  Recent EGD.  S/p dilatation.  Swallowing - doing well.  States his blood pressure has been controlled.    Past Medical History  Diagnosis Date  . Hypercholesterolemia   . Hypertension     hx of HBP readings  . GERD (gastroesophageal reflux disease)   . Dizziness    Past Surgical History  Procedure Laterality Date  . Colonoscopy    . Esophagogastroduodenoscopy (egd) with propofol N/A 05/24/2015    Procedure: ESOPHAGOGASTRODUODENOSCOPY (EGD) WITH PROPOFOL;  Surgeon: Manya Silvas, MD;  Location: [place];  Service: Endoscopy;  Laterality: N/A;  . Savory dilation N/A 05/24/2015    Procedure: SAVORY DILATION;  Surgeon: Manya Silvas, MD;  Location: [facility] ENDOSCOPY;  Service: Endoscopy;  Laterality: N/A;   Family History  Problem Relation Age of Onset  . Hyperlipidemia Mother   . Heart disease Mother   . Hypertension Mother   . Alzheimer's disease Mother    Social History   Social History  . Marital Status: Married    Spouse Name: N/A  . Number of Children: N/A  . Years of Education: N/A   Social History Main Topics  . Smoking status: Former Research scientist (life sciences)  . Smokeless tobacco: Never Used  . Alcohol Use: 0.0 oz/week    0 Standard drinks or equivalent per week  . Drug Use: No  . Sexual Activity: Not Asked   Other Topics Concern  . None   Social History Narrative     Outpatient Encounter Prescriptions as of 10/17/2015  Medication Sig  . lisinopril (PRINIVIL,ZESTRIL) 30 MG tablet TAKE 1 TABLET (30 MG TOTAL) BY MOUTH DAILY.  . Multiple Vitamins-Minerals (MULTIVITAMIN PO) Take by mouth daily.  Marland Kitchen omeprazole (PRILOSEC) 20 MG capsule Take 1 capsule (20 mg total) by mouth 2 (two) times daily.  . [DISCONTINUED] rosuvastatin (CRESTOR) 10 MG tablet Take 1 tablet (10 mg total) by mouth daily. (Patient not taking: Reported on 05/24/2015)   No facility-administered encounter medications on file as of 10/17/2015.    Review of Systems  Constitutional: Negative for appetite change and unexpected weight change.  HENT: Negative for congestion and sinus pressure.   Respiratory: Negative for cough, chest tightness and shortness of breath.   Cardiovascular: Negative for chest pain, palpitations and leg swelling.  Gastrointestinal: Negative for nausea, vomiting, abdominal pain and diarrhea.  Genitourinary: Negative for dysuria and difficulty urinating.  Musculoskeletal:       Still some neck/back issues.  Feels handling relatively well.  Desires no further intervention.    Skin: Negative for color change and rash.  Neurological: Negative for dizziness, light-headedness and headaches.  Psychiatric/Behavioral: Negative for dysphoric mood and agitation.       Increased stress as outlined.         Objective:     Blood pressure rechecked by me:  134/84  Physical Exam  Constitutional: He appears well-developed and well-nourished. No distress.  HENT:  Nose: Nose normal.  Mouth/Throat: Oropharynx is clear and moist.  Neck: Neck supple. No thyromegaly present.  Cardiovascular: Normal rate and regular rhythm.   Pulmonary/Chest: Effort normal and breath sounds normal. No respiratory distress.  Abdominal: Soft. Bowel sounds are normal. There is no tenderness.  Musculoskeletal: He exhibits no edema or tenderness.  Lymphadenopathy:    He has no cervical adenopathy.  Skin:  No rash noted. No erythema.  Psychiatric: He has a normal mood and affect. His behavior is normal.    BP 140/90 mmHg  Pulse 88  Temp(Src) 98.1 F (36.7 C) (Oral)  Resp 18  Ht 5\' 7"  (1.702 m)  Wt 163 lb 8 oz (74.163 kg)  BMI 25.60 kg/m2  SpO2 97% Wt Readings from Last 3 Encounters:  10/17/15 163 lb 8 oz (74.163 kg)  05/24/15 160 lb (72.576 kg)  04/18/15 161 lb (73.029 kg)     Lab Results  Component Value Date   WBC 7.7 05/10/2015   HGB 13.8 05/10/2015   HCT 41.8 05/10/2015   PLT 401* 05/17/2015   GLUCOSE 98 05/10/2015   CHOL 289* 05/10/2015   TRIG 254.0* 05/10/2015   HDL 39.40 05/10/2015   LDLDIRECT 203.0 05/10/2015   LDLCALC 202* 04/20/2014   ALT 25 05/10/2015   AST 21 05/10/2015   NA 135 05/10/2015   K 4.6 05/17/2015   CL 99 05/10/2015   CREATININE 0.97 05/10/2015   BUN 17 05/10/2015   CO2 28 05/10/2015   TSH 1.14 05/10/2015   PSA 0.77 05/10/2015       Assessment & Plan:   Problem List Items Addressed This Visit    Essential hypertension, benign - Primary    Blood pressure on recheck improved.  His checks controlled.  Continue same medication regimen.  Follow pressures.  Follow metabolic panel.        Relevant Orders   Basic metabolic panel   GERD (gastroesophageal reflux disease)    On omeprazole.  EGD 05/2015.  S/p dilatation.  Swallowing well.  Upper symptoms controlled.        Hypercholesterolemia    Low cholesterol diet and exercise.  Follow lipid panel.  Desires no medication.   Lab Results  Component Value Date   CHOL 289* 05/10/2015   HDL 39.40 05/10/2015   LDLCALC 202* 04/20/2014   LDLDIRECT 203.0 05/10/2015   TRIG 254.0* 05/10/2015   CHOLHDL 7 05/10/2015         Relevant Orders   Lipid panel   Hepatic function panel   Neck pain       Einar Pheasant, MD

## 2015-10-20 ENCOUNTER — Encounter: Payer: Self-pay | Admitting: Internal Medicine

## 2015-10-20 NOTE — Assessment & Plan Note (Signed)
On omeprazole.  EGD 05/2015.  S/p dilatation.  Swallowing well.  Upper symptoms controlled.

## 2015-10-20 NOTE — Assessment & Plan Note (Signed)
Blood pressure on recheck improved.  His checks controlled.  Continue same medication regimen.  Follow pressures.  Follow metabolic panel.

## 2015-10-20 NOTE — Assessment & Plan Note (Signed)
Low cholesterol diet and exercise.  Follow lipid panel.  Desires no medication.   Lab Results  Component Value Date   CHOL 289* 05/10/2015   HDL 39.40 05/10/2015   LDLCALC 202* 04/20/2014   LDLDIRECT 203.0 05/10/2015   TRIG 254.0* 05/10/2015   CHOLHDL 7 05/10/2015

## 2015-11-17 ENCOUNTER — Encounter: Payer: Self-pay | Admitting: Internal Medicine

## 2015-11-18 ENCOUNTER — Encounter: Payer: Self-pay | Admitting: Internal Medicine

## 2015-11-22 ENCOUNTER — Other Ambulatory Visit: Payer: BLUE CROSS/BLUE SHIELD

## 2015-12-06 ENCOUNTER — Other Ambulatory Visit (INDEPENDENT_AMBULATORY_CARE_PROVIDER_SITE_OTHER): Payer: BLUE CROSS/BLUE SHIELD

## 2015-12-06 DIAGNOSIS — Z862 Personal history of diseases of the blood and blood-forming organs and certain disorders involving the immune mechanism: Secondary | ICD-10-CM

## 2015-12-06 DIAGNOSIS — E7800 Pure hypercholesterolemia, unspecified: Secondary | ICD-10-CM | POA: Diagnosis not present

## 2015-12-06 DIAGNOSIS — I10 Essential (primary) hypertension: Secondary | ICD-10-CM

## 2015-12-06 LAB — BASIC METABOLIC PANEL
BUN: 22 mg/dL (ref 6–23)
CALCIUM: 10 mg/dL (ref 8.4–10.5)
CO2: 27 mEq/L (ref 19–32)
Chloride: 101 mEq/L (ref 96–112)
Creatinine, Ser: 0.93 mg/dL (ref 0.40–1.50)
GFR: 89.52 mL/min (ref >60.00)
GLUCOSE: 101 mg/dL — AB (ref 70–99)
POTASSIUM: 5 meq/L (ref 3.5–5.1)
SODIUM: 137 meq/L (ref 135–145)

## 2015-12-06 LAB — LDL CHOLESTEROL, DIRECT: Direct LDL: 175 mg/dL

## 2015-12-06 LAB — HEPATIC FUNCTION PANEL
ALBUMIN: 4.4 g/dL (ref 3.5–5.2)
ALT: 28 U/L (ref 0–53)
AST: 22 U/L (ref 0–37)
Alkaline Phosphatase: 59 U/L (ref 39–117)
BILIRUBIN TOTAL: 0.5 mg/dL (ref 0.2–1.2)
Bilirubin, Direct: 0.1 mg/dL (ref 0.0–0.3)
TOTAL PROTEIN: 7.5 g/dL (ref 6.0–8.3)

## 2015-12-06 LAB — PLATELET COUNT: PLATELETS: 380 10*3/uL (ref 140–400)

## 2015-12-06 LAB — LIPID PANEL
CHOLESTEROL: 297 mg/dL — AB (ref 0–200)
HDL: 41.2 mg/dL (ref >39.00)
NonHDL: 256.05
TRIGLYCERIDES: 245 mg/dL — AB (ref 0.0–149.0)
Total CHOL/HDL Ratio: 7
VLDL: 49 mg/dL — AB (ref 0.0–40.0)

## 2015-12-10 ENCOUNTER — Encounter: Payer: Self-pay | Admitting: Internal Medicine

## 2015-12-12 NOTE — Telephone Encounter (Signed)
Unread mychart message mailed to patient 

## 2015-12-21 ENCOUNTER — Other Ambulatory Visit: Payer: Self-pay | Admitting: Internal Medicine

## 2016-02-25 DIAGNOSIS — D485 Neoplasm of uncertain behavior of skin: Secondary | ICD-10-CM | POA: Diagnosis not present

## 2016-02-25 DIAGNOSIS — D0462 Carcinoma in situ of skin of left upper limb, including shoulder: Secondary | ICD-10-CM | POA: Diagnosis not present

## 2016-02-25 DIAGNOSIS — Z85828 Personal history of other malignant neoplasm of skin: Secondary | ICD-10-CM | POA: Diagnosis not present

## 2016-02-25 DIAGNOSIS — L820 Inflamed seborrheic keratosis: Secondary | ICD-10-CM | POA: Diagnosis not present

## 2016-02-27 ENCOUNTER — Encounter: Payer: Self-pay | Admitting: Internal Medicine

## 2016-02-27 ENCOUNTER — Ambulatory Visit (INDEPENDENT_AMBULATORY_CARE_PROVIDER_SITE_OTHER): Payer: BLUE CROSS/BLUE SHIELD | Admitting: Internal Medicine

## 2016-02-27 VITALS — BP 128/80 | HR 94 | Temp 98.1°F | Resp 18 | Ht 67.0 in | Wt 162.5 lb

## 2016-02-27 DIAGNOSIS — Z125 Encounter for screening for malignant neoplasm of prostate: Secondary | ICD-10-CM

## 2016-02-27 DIAGNOSIS — E7800 Pure hypercholesterolemia, unspecified: Secondary | ICD-10-CM | POA: Diagnosis not present

## 2016-02-27 DIAGNOSIS — R42 Dizziness and giddiness: Secondary | ICD-10-CM | POA: Diagnosis not present

## 2016-02-27 DIAGNOSIS — K219 Gastro-esophageal reflux disease without esophagitis: Secondary | ICD-10-CM | POA: Diagnosis not present

## 2016-02-27 DIAGNOSIS — I10 Essential (primary) hypertension: Secondary | ICD-10-CM

## 2016-02-27 DIAGNOSIS — M542 Cervicalgia: Secondary | ICD-10-CM | POA: Diagnosis not present

## 2016-02-27 NOTE — Progress Notes (Signed)
[name], male   DOB: 01-11-1961, 55 y.o.   MRN: [id number]   Subjective:    [name], male    DOB: 17-Aug-1961, 55 y.o.   MRN: [id number]  HPI  Patient here for a scheduled follow up.   He states he is doing relatively well.  Stress is better.  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  Overall handling things well.  No chest pain.  No sob.  No acid relfux.  No abdominal pain or cramping.  Bowels stable.     Past Medical History  Diagnosis Date  . Hypercholesterolemia   . Hypertension     hx of HBP readings  . GERD (gastroesophageal reflux disease)   . Dizziness    Past Surgical History  Procedure Laterality Date  . Colonoscopy    . Esophagogastroduodenoscopy (egd) with propofol N/A 05/24/2015    Procedure: ESOPHAGOGASTRODUODENOSCOPY (EGD) WITH PROPOFOL;  Surgeon: Manya Silvas, MD;  Location: [place];  Service: Endoscopy;  Laterality: N/A;  . Savory dilation N/A 05/24/2015    Procedure: SAVORY DILATION;  Surgeon: Manya Silvas, MD;  Location: [facility] ENDOSCOPY;  Service: Endoscopy;  Laterality: N/A;   Family History  Problem Relation Age of Onset  . Hyperlipidemia Mother   . Heart disease Mother   . Hypertension Mother   . Alzheimer's disease Mother    Social History   Social History  . Marital Status: Married    Spouse Name: N/A  . Number of Children: N/A  . Years of Education: N/A   Social History Main Topics  . Smoking status: Former Research scientist (life sciences)  . Smokeless tobacco: Never Used  . Alcohol Use: 0.0 oz/week    0 Standard drinks or equivalent per week  . Drug Use: No  . Sexual Activity: Not Asked   Other Topics Concern  . None   Social History Narrative    Outpatient Encounter Prescriptions as of 02/27/2016  Medication Sig  . lisinopril (PRINIVIL,ZESTRIL) 30 MG tablet TAKE 1 TABLET (30 MG TOTAL) BY MOUTH DAILY.  . Multiple Vitamins-Minerals (MULTIVITAMIN PO) Take by mouth daily.  Marland Kitchen omeprazole (PRILOSEC) 20 MG  capsule Take 1 capsule (20 mg total) by mouth 2 (two) times daily.   No facility-administered encounter medications on file as of 02/27/2016.    Review of Systems  Constitutional: Negative for fatigue and unexpected weight change.  HENT: Negative for congestion and sinus pressure.   Respiratory: Negative for cough, chest tightness and shortness of breath.   Cardiovascular: Negative for chest pain, palpitations and leg swelling.  Gastrointestinal: Negative for nausea, vomiting, abdominal pain and diarrhea.  Genitourinary: Negative for dysuria and difficulty urinating.  Musculoskeletal: Negative for back pain and joint swelling.  Skin: Negative for color change and rash.  Neurological: Negative for syncope and weakness.  Psychiatric/Behavioral: Negative for dysphoric mood and agitation.       Objective:    Physical Exam  Constitutional: He appears well-developed and well-nourished. No distress.  HENT:  Nose: Nose normal.  Mouth/Throat: Oropharynx is clear and moist.  Neck: Neck supple. No thyromegaly present.  Cardiovascular: Normal rate and regular rhythm.   Pulmonary/Chest: Effort normal and breath sounds normal. No respiratory distress.  Abdominal: Soft. Bowel sounds are normal. There is no tenderness.  Musculoskeletal: He exhibits no edema or tenderness.  Lymphadenopathy:    He has no cervical adenopathy.  Skin: No rash noted. No erythema.  Psychiatric: He has a normal mood and affect. His behavior is normal.  BP 128/80 mmHg  Pulse 94  Temp(Src) 98.1 F (36.7 C) (Oral)  Resp 18  Ht 5\' 7"  (1.702 m)  Wt 162 lb 8 oz (73.71 kg)  BMI 25.45 kg/m2  SpO2 95% Wt Readings from Last 3 Encounters:  02/27/16 162 lb 8 oz (73.71 kg)  10/17/15 163 lb 8 oz (74.163 kg)  05/24/15 160 lb (72.576 kg)     Lab Results  Component Value Date   WBC 7.7 05/10/2015   HGB 13.8 05/10/2015   HCT 41.8 05/10/2015   PLT 380 12/06/2015   GLUCOSE 101* 12/06/2015   CHOL 297* 12/06/2015    TRIG 245.0* 12/06/2015   HDL 41.20 12/06/2015   LDLDIRECT 175.0 12/06/2015   LDLCALC 202* 04/20/2014   ALT 28 12/06/2015   AST 22 12/06/2015   NA 137 12/06/2015   K 5.0 12/06/2015   CL 101 12/06/2015   CREATININE 0.93 12/06/2015   BUN 22 12/06/2015   CO2 27 12/06/2015   TSH 1.14 05/10/2015   PSA 0.77 05/10/2015       Assessment & Plan:   Problem List Items Addressed This Visit    Dizziness    Has had extensive w/up.  Has seen multiple neurologists.  Told neck contributing.  Doing exercises at home.  Stable.  Desires no further w/up.  Follow.        Essential hypertension, benign    Blood pressure under good control.  Continue same medication regimen.  Follow pressures.  Follow metabolic panel.        Relevant Orders   CBC with Differential/Platelet   TSH   Basic metabolic panel   GERD (gastroesophageal reflux disease)    On omeprazole.  Upper symptoms controlled.        Hypercholesterolemia    Low cholesterol diet and exercise.  Follow lipid panel.        Relevant Orders   Lipid panel   Hepatic function panel   Neck pain - Primary    Discussed with him today.  Has had extensive w/up in the past, including MRI and saw Dr Sharlet Salina.  Desires no further intervention.  Follow.         Other Visit Diagnoses    Prostate cancer screening        Relevant Orders    PSA        Einar Pheasant, MD

## 2016-02-27 NOTE — Progress Notes (Signed)
Pre-visit discussion using our clinic review tool. No additional management support is needed unless otherwise documented below in the visit note.  

## 2016-02-29 ENCOUNTER — Encounter: Payer: Self-pay | Admitting: Internal Medicine

## 2016-02-29 NOTE — Assessment & Plan Note (Signed)
Blood pressure under good control.  Continue same medication regimen.  Follow pressures.  Follow metabolic panel.   

## 2016-02-29 NOTE — Assessment & Plan Note (Signed)
On omeprazole.  Upper symptoms controlled.   

## 2016-02-29 NOTE — Assessment & Plan Note (Signed)
Has had extensive w/up.  Has seen multiple neurologists.  Told neck contributing.  Doing exercises at home.  Stable.  Desires no further w/up.  Follow.

## 2016-02-29 NOTE — Assessment & Plan Note (Signed)
Low cholesterol diet and exercise.  Follow lipid panel.   

## 2016-02-29 NOTE — Assessment & Plan Note (Signed)
Discussed with him today.  Has had extensive w/up in the past, including MRI and saw Dr Sharlet Salina.  Desires no further intervention.  Follow.

## 2016-03-22 ENCOUNTER — Other Ambulatory Visit: Payer: Self-pay | Admitting: Internal Medicine

## 2016-04-09 DIAGNOSIS — D0462 Carcinoma in situ of skin of left upper limb, including shoulder: Secondary | ICD-10-CM | POA: Diagnosis not present

## 2016-07-02 ENCOUNTER — Ambulatory Visit (INDEPENDENT_AMBULATORY_CARE_PROVIDER_SITE_OTHER): Payer: BLUE CROSS/BLUE SHIELD | Admitting: Internal Medicine

## 2016-07-02 ENCOUNTER — Encounter: Payer: Self-pay | Admitting: Internal Medicine

## 2016-07-02 VITALS — BP 130/82 | HR 107 | Temp 97.7°F | Ht 67.0 in | Wt 166.2 lb

## 2016-07-02 DIAGNOSIS — I10 Essential (primary) hypertension: Secondary | ICD-10-CM

## 2016-07-02 DIAGNOSIS — M542 Cervicalgia: Secondary | ICD-10-CM

## 2016-07-02 DIAGNOSIS — K219 Gastro-esophageal reflux disease without esophagitis: Secondary | ICD-10-CM

## 2016-07-02 DIAGNOSIS — E7800 Pure hypercholesterolemia, unspecified: Secondary | ICD-10-CM

## 2016-07-02 DIAGNOSIS — Z0000 Encounter for general adult medical examination without abnormal findings: Secondary | ICD-10-CM

## 2016-07-02 NOTE — Progress Notes (Signed)
Pre visit review using our clinic review tool, if applicable. No additional management support is needed unless otherwise documented below in the visit note. 

## 2016-07-02 NOTE — Progress Notes (Signed)
[name], male   DOB: 10-Sep-1960, 55 y.o.   MRN: [id number]   Subjective:    [name], male    DOB: 06-01-1961, 55 y.o.   MRN: [id number]  HPI  Patient here for his physical exam.  He states he is dong well.  Tries to stay active.  Discussed diet and exercise.  No chest pain or tightness.  No sob.  No acid reflux.  No abdominal pain or cramping.  Bowels stable.  Blood pressure is doing well.     Past Medical History:  Diagnosis Date  . Dizziness   . GERD (gastroesophageal reflux disease)   . Hypercholesterolemia   . Hypertension    hx of HBP readings   Past Surgical History:  Procedure Laterality Date  . COLONOSCOPY    . ESOPHAGOGASTRODUODENOSCOPY (EGD) WITH PROPOFOL N/A 05/24/2015   Procedure: ESOPHAGOGASTRODUODENOSCOPY (EGD) WITH PROPOFOL;  Surgeon: Manya Silvas, MD;  Location[facility] ENDOSCOPY;  Service: Endoscopy;  Laterality: N/A;  . SAVORY DILATION N/A 05/24/2015   Procedure: SAVORY DILATION;  Surgeon: Manya Silvas, MD;  Location: [facility] ENDOSCOPY;  Service: Endoscopy;  Laterality: N/A;   Family History  Problem Relation Age of Onset  . Hyperlipidemia Mother   . Heart disease Mother   . Hypertension Mother   . Alzheimer's disease Mother    Social History   Social History  . Marital status: Married    Spouse name: N/A  . Number of children: N/A  . Years of education: N/A   Social History Main Topics  . Smoking status: Former Research scientist (life sciences)  . Smokeless tobacco: Never Used  . Alcohol use 0.0 oz/week  . Drug use: No  . Sexual activity: Not Asked   Other Topics Concern  . None   Social History Narrative  . None    Outpatient Encounter Prescriptions as of 07/02/2016  Medication Sig  . lisinopril (PRINIVIL,ZESTRIL) 30 MG tablet TAKE 1 TABLET (30 MG TOTAL) BY MOUTH DAILY.  . Multiple Vitamins-Minerals (MULTIVITAMIN PO) Take by mouth daily.  Marland Kitchen omeprazole (PRILOSEC) 20 MG capsule TAKE 1 CAPSULE (20 MG TOTAL) BY MOUTH 2 (TWO)  TIMES DAILY.   No facility-administered encounter medications on file as of 07/02/2016.     Review of Systems  Constitutional: Negative for appetite change and unexpected weight change.  HENT: Negative for congestion and sinus pressure.   Eyes: Negative for pain and visual disturbance.  Respiratory: Negative for cough, chest tightness and shortness of breath.   Cardiovascular: Negative for chest pain, palpitations and leg swelling.  Gastrointestinal: Negative for abdominal pain, diarrhea, nausea and vomiting.  Genitourinary: Negative for difficulty urinating and dysuria.  Musculoskeletal: Negative for back pain and joint swelling.  Skin: Negative for color change and rash.  Neurological: Negative for dizziness, light-headedness and headaches.  Hematological: Negative for adenopathy. Does not bruise/bleed easily.  Psychiatric/Behavioral: Negative for agitation and dysphoric mood.       Objective:     Blood pressure rechecked by me:  130/82  Physical Exam  Constitutional: He is oriented to person, place, and time. He appears well-developed and well-nourished. No distress.  HENT:  Head: Normocephalic and atraumatic.  Nose: Nose normal.  Mouth/Throat: Oropharynx is clear and moist. No oropharyngeal exudate.  Eyes: Conjunctivae are normal. Right eye exhibits no discharge. Left eye exhibits no discharge.  Neck: Neck supple. No thyromegaly present.  Cardiovascular: Normal rate and regular rhythm.   Pulmonary/Chest: Breath sounds normal. No respiratory distress. He has no wheezes.  Abdominal: Soft. Bowel sounds are normal. There is no tenderness.  Genitourinary:  Genitourinary Comments: Rectal exam - no palpable prostate nodules.  Heme negative.    Musculoskeletal: He exhibits no edema or tenderness.  Lymphadenopathy:    He has no cervical adenopathy.  Neurological: He is alert and oriented to person, place, and time.  Skin: Skin is warm and dry. No rash noted. No erythema.    Psychiatric: He has a normal mood and affect. His behavior is normal.    BP 130/82   Pulse (!) 107   Temp 97.7 F (36.5 C) (Oral)   Ht 5\' 7"  (1.702 m)   Wt 166 lb 3.2 oz (75.4 kg)   SpO2 95%   BMI 26.03 kg/m  Wt Readings from Last 3 Encounters:  07/02/16 166 lb 3.2 oz (75.4 kg)  02/27/16 162 lb 8 oz (73.7 kg)  10/17/15 163 lb 8 oz (74.2 kg)     Lab Results  Component Value Date   WBC 7.7 05/10/2015   HGB 13.8 05/10/2015   HCT 41.8 05/10/2015   PLT 380 12/06/2015   GLUCOSE 101 (H) 12/06/2015   CHOL 297 (H) 12/06/2015   TRIG 245.0 (H) 12/06/2015   HDL 41.20 12/06/2015   LDLDIRECT 175.0 12/06/2015   LDLCALC 202 (H) 04/20/2014   ALT 28 12/06/2015   AST 22 12/06/2015   NA 137 12/06/2015   K 5.0 12/06/2015   CL 101 12/06/2015   CREATININE 0.93 12/06/2015   BUN 22 12/06/2015   CO2 27 12/06/2015   TSH 1.14 05/10/2015   PSA 0.77 05/10/2015       Assessment & Plan:   Problem List Items Addressed This Visit    Essential hypertension, benign    Blood pressure on recheck ok.  Continue same medication regimen.  Follow pressure.  Follow metabolic panel.        GERD (gastroesophageal reflux disease)    On omeprazole.  Controlled.        Health care maintenance    Physical today 07/02/16.  Check psa with next labs.  colonoscopy 06/23/13 - internal hemorrhoids.        Hypercholesterolemia    Low cholesterol diet and exercise.  Follow  Lipid panel.        Neck pain    Has had extensive w/up in the past as outlined.  Desires no further intervention.  Stable.            Einar Pheasant, MD

## 2016-07-02 NOTE — Assessment & Plan Note (Signed)
Physical today 07/02/16.  Check psa with next labs.  colonoscopy 06/23/13 - internal hemorrhoids.

## 2016-07-04 ENCOUNTER — Encounter: Payer: Self-pay | Admitting: Internal Medicine

## 2016-07-04 NOTE — Assessment & Plan Note (Signed)
Low cholesterol diet and exercise.  Follow  Lipid panel.   

## 2016-07-04 NOTE — Assessment & Plan Note (Signed)
On omeprazole.  Controlled.   

## 2016-07-04 NOTE — Assessment & Plan Note (Signed)
Has had extensive w/up in the past as outlined.  Desires no further intervention.  Stable.

## 2016-07-04 NOTE — Assessment & Plan Note (Signed)
Blood pressure on recheck ok.  Continue same medication regimen.  Follow pressure.  Follow metabolic panel.

## 2016-07-17 ENCOUNTER — Other Ambulatory Visit (INDEPENDENT_AMBULATORY_CARE_PROVIDER_SITE_OTHER): Payer: BLUE CROSS/BLUE SHIELD

## 2016-07-17 DIAGNOSIS — E7800 Pure hypercholesterolemia, unspecified: Secondary | ICD-10-CM

## 2016-07-17 DIAGNOSIS — Z125 Encounter for screening for malignant neoplasm of prostate: Secondary | ICD-10-CM

## 2016-07-17 DIAGNOSIS — I10 Essential (primary) hypertension: Secondary | ICD-10-CM | POA: Diagnosis not present

## 2016-07-17 LAB — CBC WITH DIFFERENTIAL/PLATELET
BASOS ABS: 0 10*3/uL (ref 0.0–0.1)
Basophils Relative: 0.5 % (ref 0.0–3.0)
Eosinophils Absolute: 0.4 10*3/uL (ref 0.0–0.7)
Eosinophils Relative: 4.7 % (ref 0.0–5.0)
HCT: 42 % (ref 39.0–52.0)
Hemoglobin: 14 g/dL (ref 13.0–17.0)
LYMPHS ABS: 2.8 10*3/uL (ref 0.7–4.0)
Lymphocytes Relative: 37.9 % (ref 12.0–46.0)
MCHC: 33.4 g/dL (ref 30.0–36.0)
MCV: 83.2 fl (ref 78.0–100.0)
MONO ABS: 0.8 10*3/uL (ref 0.1–1.0)
Monocytes Relative: 10.1 % (ref 3.0–12.0)
NEUTROS ABS: 3.5 10*3/uL (ref 1.4–7.7)
NEUTROS PCT: 46.8 % (ref 43.0–77.0)
PLATELETS: 373 10*3/uL (ref 150.0–400.0)
RBC: 5.04 Mil/uL (ref 4.22–5.81)
RDW: 14.5 % (ref 11.5–15.5)
WBC: 7.5 10*3/uL (ref 4.0–10.5)

## 2016-07-17 LAB — LIPID PANEL
CHOL/HDL RATIO: 8
CHOLESTEROL: 306 mg/dL — AB (ref 0–200)
HDL: 39.6 mg/dL (ref >39.00)
NONHDL: 266
TRIGLYCERIDES: 240 mg/dL — AB (ref 0.0–149.0)
VLDL: 48 mg/dL — AB (ref 0.0–40.0)

## 2016-07-17 LAB — BASIC METABOLIC PANEL
BUN: 18 mg/dL (ref 6–23)
CHLORIDE: 102 meq/L (ref 96–112)
CO2: 29 mEq/L (ref 19–32)
Calcium: 9.8 mg/dL (ref 8.4–10.5)
Creatinine, Ser: 1.07 mg/dL (ref 0.40–1.50)
GFR: 75.97 mL/min (ref >60.00)
Glucose, Bld: 100 mg/dL — ABNORMAL HIGH (ref 70–99)
POTASSIUM: 5.2 meq/L — AB (ref 3.5–5.1)
Sodium: 138 mEq/L (ref 135–145)

## 2016-07-17 LAB — HEPATIC FUNCTION PANEL
ALBUMIN: 4.4 g/dL (ref 3.5–5.2)
ALT: 31 U/L (ref 0–53)
AST: 23 U/L (ref 0–37)
Alkaline Phosphatase: 50 U/L (ref 39–117)
BILIRUBIN DIRECT: 0.1 mg/dL (ref 0.0–0.3)
TOTAL PROTEIN: 7.5 g/dL (ref 6.0–8.3)
Total Bilirubin: 0.5 mg/dL (ref 0.2–1.2)

## 2016-07-17 LAB — LDL CHOLESTEROL, DIRECT: LDL DIRECT: 181 mg/dL

## 2016-07-17 LAB — PSA: PSA: 0.84 ng/mL (ref 0.10–4.00)

## 2016-07-17 LAB — TSH: TSH: 1.13 u[IU]/mL (ref 0.35–4.50)

## 2016-07-21 ENCOUNTER — Other Ambulatory Visit: Payer: Self-pay | Admitting: Internal Medicine

## 2016-07-21 DIAGNOSIS — E875 Hyperkalemia: Secondary | ICD-10-CM

## 2016-07-21 NOTE — Progress Notes (Signed)
Order placed for f/u potassium lab.  

## 2016-07-24 ENCOUNTER — Other Ambulatory Visit (INDEPENDENT_AMBULATORY_CARE_PROVIDER_SITE_OTHER): Payer: BLUE CROSS/BLUE SHIELD

## 2016-07-24 DIAGNOSIS — E875 Hyperkalemia: Secondary | ICD-10-CM

## 2016-07-24 LAB — POTASSIUM: Potassium: 4.4 mEq/L (ref 3.5–5.1)

## 2016-07-25 ENCOUNTER — Encounter: Payer: Self-pay | Admitting: Internal Medicine

## 2016-08-17 DIAGNOSIS — I8290 Acute embolism and thrombosis of unspecified vein: Secondary | ICD-10-CM

## 2016-08-17 DIAGNOSIS — C799 Secondary malignant neoplasm of unspecified site: Secondary | ICD-10-CM

## 2016-08-17 DIAGNOSIS — IMO0002 Reserved for concepts with insufficient information to code with codable children: Secondary | ICD-10-CM

## 2016-08-17 HISTORY — DX: Secondary malignant neoplasm of unspecified site: C79.9

## 2016-08-17 HISTORY — DX: Acute embolism and thrombosis of unspecified vein: I82.90

## 2016-08-17 HISTORY — DX: Reserved for concepts with insufficient information to code with codable children: IMO0002

## 2016-09-30 ENCOUNTER — Encounter: Payer: Self-pay | Admitting: Internal Medicine

## 2016-09-30 DIAGNOSIS — Z8249 Family history of ischemic heart disease and other diseases of the circulatory system: Secondary | ICD-10-CM

## 2016-10-03 NOTE — Telephone Encounter (Signed)
Order placed for cardiology referral.   

## 2016-10-05 NOTE — Telephone Encounter (Signed)
If he is ok with waiting until Wednesday, I can work him in for this at 11:30 on Wednesday.  He may have to wait - working him in.  Thanks

## 2016-10-05 NOTE — Telephone Encounter (Signed)
See my message on working in pt.

## 2016-10-05 NOTE — Telephone Encounter (Signed)
See message about work in appt.

## 2016-10-07 ENCOUNTER — Encounter: Payer: Self-pay | Admitting: Internal Medicine

## 2016-10-07 ENCOUNTER — Ambulatory Visit (INDEPENDENT_AMBULATORY_CARE_PROVIDER_SITE_OTHER): Payer: BLUE CROSS/BLUE SHIELD | Admitting: Internal Medicine

## 2016-10-07 DIAGNOSIS — R221 Localized swelling, mass and lump, neck: Secondary | ICD-10-CM | POA: Insufficient documentation

## 2016-10-07 LAB — CBC WITH DIFFERENTIAL/PLATELET
Basophils Absolute: 0.1 10*3/uL (ref 0.0–0.1)
Basophils Relative: 0.6 % (ref 0.0–3.0)
Eosinophils Absolute: 0.2 10*3/uL (ref 0.0–0.7)
Eosinophils Relative: 1.9 % (ref 0.0–5.0)
HCT: 39 % (ref 39.0–52.0)
Hemoglobin: 12.8 g/dL — ABNORMAL LOW (ref 13.0–17.0)
Lymphocytes Relative: 37.5 % (ref 12.0–46.0)
Lymphs Abs: 3.5 10*3/uL (ref 0.7–4.0)
MCHC: 33 g/dL (ref 30.0–36.0)
MCV: 84.1 fl (ref 78.0–100.0)
Monocytes Absolute: 0.7 10*3/uL (ref 0.1–1.0)
Monocytes Relative: 8 % (ref 3.0–12.0)
Neutro Abs: 4.8 10*3/uL (ref 1.4–7.7)
Neutrophils Relative %: 52 % (ref 43.0–77.0)
Platelets: 389 10*3/uL (ref 150.0–400.0)
RBC: 4.63 Mil/uL (ref 4.22–5.81)
RDW: 14.4 % (ref 11.5–15.5)
WBC: 9.3 10*3/uL (ref 4.0–10.5)

## 2016-10-07 LAB — CREATININE, SERUM: Creatinine, Ser: 1.08 mg/dL (ref 0.40–1.50)

## 2016-10-07 NOTE — Progress Notes (Signed)
[name], male   DOB: 12/19/1960, 56 y.o.   MRN: [id number]   Subjective:    [name], male    DOB: 1961-05-25, 56 y.o.   MRN: [id number]  HPI  Patient here as a work in with concerns regarding right neck nodule.  Noticed over the last two weeks.  No pain.  Fullness.  No active infectious symptoms.  No sinus pressure or nasal congestion.  No sore throat now.  No ear ache.  No fever.  Eating and drinking well.  Feels fine.  Neck mass persisted.     Past Medical History:  Diagnosis Date  . Dizziness   . GERD (gastroesophageal reflux disease)   . Hypercholesterolemia   . Hypertension    hx of HBP readings   Past Surgical History:  Procedure Laterality Date  . COLONOSCOPY    . ESOPHAGOGASTRODUODENOSCOPY (EGD) WITH PROPOFOL N/A 05/24/2015   Procedure: ESOPHAGOGASTRODUODENOSCOPY (EGD) WITH PROPOFOL;  Surgeon: Manya Silvas, MD;  Location: [facility] ENDOSCOPY;  Service: Endoscopy;  Laterality: N/A;  . SAVORY DILATION N/A 05/24/2015   Procedure: SAVORY DILATION;  Surgeon: Manya Silvas, MD;  Location: [facility] ENDOSCOPY;  Service: Endoscopy;  Laterality: N/A;   Family History  Problem Relation Age of Onset  . Hyperlipidemia Mother   . Heart disease Mother   . Hypertension Mother   . Alzheimer's disease Mother    Social History   Social History  . Marital status: Married    Spouse name: N/A  . Number of children: N/A  . Years of education: N/A   Social History Main Topics  . Smoking status: Former Research scientist (life sciences)  . Smokeless tobacco: Never Used  . Alcohol use 0.0 oz/week  . Drug use: No  . Sexual activity: Not Asked   Other Topics Concern  . None   Social History Narrative  . None    Outpatient Encounter Prescriptions as of 10/07/2016  Medication Sig  . lisinopril (PRINIVIL,ZESTRIL) 30 MG tablet TAKE 1 TABLET (30 MG TOTAL) BY MOUTH DAILY.  . Multiple Vitamins-Minerals (MULTIVITAMIN PO) Take by mouth daily.  Marland Kitchen omeprazole (PRILOSEC) 20 MG  capsule TAKE 1 CAPSULE (20 MG TOTAL) BY MOUTH 2 (TWO) TIMES DAILY.   No facility-administered encounter medications on file as of 10/07/2016.     Review of Systems  Constitutional: Negative for appetite change and unexpected weight change.  HENT: Negative for congestion, sinus pressure and sore throat.   Respiratory: Negative for cough and shortness of breath.   Gastrointestinal: Negative for diarrhea, nausea and vomiting.  Skin: Negative for color change and rash.       Objective:    Physical Exam  Constitutional: He appears well-developed and well-nourished. No distress.  HENT:  Nose: Nose normal.  Mouth/Throat: Oropharynx is clear and moist.  Ears:  TMs without erythema.    Eyes: Conjunctivae are normal. Pupils are equal, round, and reactive to light.  Neck: Neck supple.  Right neck mass.  Non tender.    Cardiovascular: Normal rate and regular rhythm.   Pulmonary/Chest: Effort normal and breath sounds normal. No respiratory distress.  Abdominal: Soft. Bowel sounds are normal. There is no tenderness.  Skin: No rash noted. No erythema.  Psychiatric: He has a normal mood and affect. His behavior is normal.    BP 126/82 (BP Location: Left Arm, Patient Position: Sitting, Cuff Size: Large)   Pulse 93   Temp 98.3 F (36.8 C) (Oral)   Resp 16   Ht 5\' 7"  (  1.702 m)   Wt 164 lb 3.2 oz (74.5 kg)   SpO2 95%   BMI 25.72 kg/m  Wt Readings from Last 3 Encounters:  10/07/16 164 lb 3.2 oz (74.5 kg)  07/02/16 166 lb 3.2 oz (75.4 kg)  02/27/16 162 lb 8 oz (73.7 kg)     Lab Results  Component Value Date   WBC 9.3 10/07/2016   HGB 12.8 (L) 10/07/2016   HCT 39.0 10/07/2016   PLT 389.0 10/07/2016   GLUCOSE 100 (H) 07/17/2016   CHOL 306 (H) 07/17/2016   TRIG 240.0 (H) 07/17/2016   HDL 39.60 07/17/2016   LDLDIRECT 181.0 07/17/2016   LDLCALC 202 (H) 04/20/2014   ALT 31 07/17/2016   AST 23 07/17/2016   NA 138 07/17/2016   K 4.4 07/24/2016   CL 102 07/17/2016   CREATININE 1.08  10/07/2016   BUN 18 07/17/2016   CO2 29 07/17/2016   TSH 1.13 07/17/2016   PSA 0.84 07/17/2016       Assessment & Plan:   Problem List Items Addressed This Visit    Neck mass    Persistent neck mass.  Discussed with him at appt regarding obtaining CT scan.  He is in agreement.  Also discussed referral to ENT.  Will start with CT and then pursue further w/up.  Check cbc.  He called back after CT scheduled and requested referral to ENT first.  He will have to pay a lot out of pocket for CT and wanted to see ENT first.  If needs CT after their evaluation, will then pursue.        Relevant Orders   CT Soft Tissue Neck W Contrast   CBC with Differential/Platelet (Completed)   Creatinine (Completed)       Einar Pheasant, MD

## 2016-10-07 NOTE — Progress Notes (Signed)
Pre-visit discussion using our clinic review tool. No additional management support is needed unless otherwise documented below in the visit note.  

## 2016-10-08 ENCOUNTER — Other Ambulatory Visit: Payer: Self-pay | Admitting: Internal Medicine

## 2016-10-08 DIAGNOSIS — R221 Localized swelling, mass and lump, neck: Secondary | ICD-10-CM

## 2016-10-08 NOTE — Progress Notes (Signed)
Order placed for CT scan of neck.

## 2016-10-09 ENCOUNTER — Ambulatory Visit: Payer: BLUE CROSS/BLUE SHIELD

## 2016-10-09 ENCOUNTER — Other Ambulatory Visit: Payer: Self-pay | Admitting: Internal Medicine

## 2016-10-09 DIAGNOSIS — D649 Anemia, unspecified: Secondary | ICD-10-CM

## 2016-10-09 NOTE — Progress Notes (Signed)
Orders placed for f/u labs.  

## 2016-10-12 ENCOUNTER — Encounter: Payer: Self-pay | Admitting: Internal Medicine

## 2016-10-12 NOTE — Assessment & Plan Note (Signed)
Persistent neck mass.  Discussed with him at appt regarding obtaining CT scan.  He is in agreement.  Also discussed referral to ENT.  Will start with CT and then pursue further w/up.  Check cbc.  He called back after CT scheduled and requested referral to ENT first.  He will have to pay a lot out of pocket for CT and wanted to see ENT first.  If needs CT after their evaluation, will then pursue.

## 2016-10-14 ENCOUNTER — Encounter: Payer: Self-pay | Admitting: Internal Medicine

## 2016-10-14 NOTE — Telephone Encounter (Signed)
Mr Shaddix is asking about the ENT appointment.  Can we see if they can see him asap.  Tami Ribas or [facility]).   He wanted to see them prior to the scan.  Thanks.

## 2016-10-21 ENCOUNTER — Other Ambulatory Visit: Payer: Self-pay | Admitting: Unknown Physician Specialty

## 2016-10-21 DIAGNOSIS — D104 Benign neoplasm of tonsil: Secondary | ICD-10-CM | POA: Diagnosis not present

## 2016-10-21 DIAGNOSIS — R221 Localized swelling, mass and lump, neck: Secondary | ICD-10-CM | POA: Diagnosis not present

## 2016-10-21 DIAGNOSIS — J351 Hypertrophy of tonsils: Secondary | ICD-10-CM

## 2016-10-23 ENCOUNTER — Other Ambulatory Visit (INDEPENDENT_AMBULATORY_CARE_PROVIDER_SITE_OTHER): Payer: BLUE CROSS/BLUE SHIELD

## 2016-10-23 DIAGNOSIS — D649 Anemia, unspecified: Secondary | ICD-10-CM | POA: Diagnosis not present

## 2016-10-23 LAB — CBC WITH DIFFERENTIAL/PLATELET
BASOS PCT: 0.9 % (ref 0.0–3.0)
Basophils Absolute: 0.1 10*3/uL (ref 0.0–0.1)
EOS PCT: 3.5 % (ref 0.0–5.0)
Eosinophils Absolute: 0.3 10*3/uL (ref 0.0–0.7)
HCT: 39.2 % (ref 39.0–52.0)
Hemoglobin: 12.9 g/dL — ABNORMAL LOW (ref 13.0–17.0)
LYMPHS ABS: 2.8 10*3/uL (ref 0.7–4.0)
Lymphocytes Relative: 37.6 % (ref 12.0–46.0)
MCHC: 32.8 g/dL (ref 30.0–36.0)
MCV: 84.1 fl (ref 78.0–100.0)
MONO ABS: 0.6 10*3/uL (ref 0.1–1.0)
MONOS PCT: 8.6 % (ref 3.0–12.0)
NEUTROS PCT: 49.4 % (ref 43.0–77.0)
Neutro Abs: 3.6 10*3/uL (ref 1.4–7.7)
Platelets: 378 10*3/uL (ref 150.0–400.0)
RBC: 4.66 Mil/uL (ref 4.22–5.81)
RDW: 14.1 % (ref 11.5–15.5)
WBC: 7.3 10*3/uL (ref 4.0–10.5)

## 2016-10-23 LAB — IBC PANEL
Iron: 54 ug/dL (ref 42–165)
Saturation Ratios: 19.5 % — ABNORMAL LOW (ref 20.0–50.0)
TRANSFERRIN: 198 mg/dL — AB (ref 212.0–360.0)

## 2016-10-23 LAB — VITAMIN B12: Vitamin B-12: 421 pg/mL (ref 211–911)

## 2016-10-23 LAB — FERRITIN: Ferritin: 211.5 ng/mL (ref 22.0–322.0)

## 2016-10-30 ENCOUNTER — Ambulatory Visit
Admission: RE | Admit: 2016-10-30 | Discharge: 2016-10-30 | Disposition: A | Discharge status: Home / Self Care | Payer: BLUE CROSS/BLUE SHIELD | Source: Ambulatory Visit | Attending: Unknown Physician Specialty | Admitting: Unknown Physician Specialty

## 2016-10-30 DIAGNOSIS — R221 Localized swelling, mass and lump, neck: Secondary | ICD-10-CM | POA: Insufficient documentation

## 2016-10-30 DIAGNOSIS — J351 Hypertrophy of tonsils: Secondary | ICD-10-CM | POA: Insufficient documentation

## 2016-10-30 MED ORDER — IOPAMIDOL (ISOVUE-300) INJECTION 61%
75.0000 mL | Freq: Once | INTRAVENOUS | Status: AC | PRN
Start: 2016-10-30 — End: 2016-10-30
  Administered 2016-10-30: 75 mL via INTRAVENOUS

## 2016-11-04 DIAGNOSIS — D104 Benign neoplasm of tonsil: Secondary | ICD-10-CM | POA: Diagnosis not present

## 2016-11-04 DIAGNOSIS — R221 Localized swelling, mass and lump, neck: Secondary | ICD-10-CM | POA: Diagnosis not present

## 2016-11-05 ENCOUNTER — Other Ambulatory Visit: Payer: Self-pay | Admitting: Unknown Physician Specialty

## 2016-11-05 DIAGNOSIS — R221 Localized swelling, mass and lump, neck: Secondary | ICD-10-CM

## 2016-11-09 ENCOUNTER — Other Ambulatory Visit: Payer: Self-pay | Admitting: Radiology

## 2016-11-11 ENCOUNTER — Ambulatory Visit
Admission: RE | Admit: 2016-11-11 | Discharge: 2016-11-11 | Disposition: A | Discharge status: Home / Self Care | Payer: BLUE CROSS/BLUE SHIELD | Source: Ambulatory Visit | Attending: Unknown Physician Specialty | Admitting: Unknown Physician Specialty

## 2016-11-11 DIAGNOSIS — C801 Malignant (primary) neoplasm, unspecified: Secondary | ICD-10-CM | POA: Insufficient documentation

## 2016-11-11 DIAGNOSIS — K219 Gastro-esophageal reflux disease without esophagitis: Secondary | ICD-10-CM | POA: Diagnosis not present

## 2016-11-11 DIAGNOSIS — C770 Secondary and unspecified malignant neoplasm of lymph nodes of head, face and neck: Secondary | ICD-10-CM | POA: Insufficient documentation

## 2016-11-11 DIAGNOSIS — R221 Localized swelling, mass and lump, neck: Secondary | ICD-10-CM

## 2016-11-11 DIAGNOSIS — I10 Essential (primary) hypertension: Secondary | ICD-10-CM | POA: Diagnosis not present

## 2016-11-11 DIAGNOSIS — Z8249 Family history of ischemic heart disease and other diseases of the circulatory system: Secondary | ICD-10-CM | POA: Insufficient documentation

## 2016-11-11 DIAGNOSIS — R591 Generalized enlarged lymph nodes: Secondary | ICD-10-CM | POA: Diagnosis not present

## 2016-11-11 DIAGNOSIS — Z79899 Other long term (current) drug therapy: Secondary | ICD-10-CM | POA: Diagnosis not present

## 2016-11-11 DIAGNOSIS — Z87891 Personal history of nicotine dependence: Secondary | ICD-10-CM | POA: Diagnosis not present

## 2016-11-11 DIAGNOSIS — R590 Localized enlarged lymph nodes: Secondary | ICD-10-CM | POA: Diagnosis not present

## 2016-11-11 LAB — CBC
HCT: 40.1 % (ref 40.0–52.0)
Hemoglobin: 13.7 g/dL (ref 13.0–18.0)
MCH: 28.7 pg (ref 26.0–34.0)
MCHC: 34 g/dL (ref 32.0–36.0)
MCV: 84.4 fL (ref 80.0–100.0)
PLATELETS: 386 10*3/uL (ref 150–440)
RBC: 4.75 MIL/uL (ref 4.40–5.90)
RDW: 14.2 % (ref 11.5–14.5)
WBC: 8.2 10*3/uL (ref 3.8–10.6)

## 2016-11-11 LAB — PROTIME-INR
INR: 0.95
PROTHROMBIN TIME: 12.7 s (ref 11.4–15.2)

## 2016-11-11 LAB — APTT: APTT: 28 s (ref 24–36)

## 2016-11-11 MED ORDER — SODIUM CHLORIDE 0.9 % IV SOLN: INTRAVENOUS | Status: DC

## 2016-11-11 NOTE — Procedures (Signed)
Right cervical node biopsy without difficulty  Complications:  None  Blood Loss: none  See dictation in canopy pacs

## 2016-11-13 ENCOUNTER — Other Ambulatory Visit: Payer: Self-pay | Admitting: Anatomic Pathology & Clinical Pathology

## 2016-11-13 LAB — SURGICAL PATHOLOGY

## 2016-11-17 ENCOUNTER — Encounter: Payer: Self-pay | Admitting: Cardiovascular Disease

## 2016-11-17 NOTE — Telephone Encounter (Signed)
This encounter was created in error - please disregard.

## 2016-11-30 ENCOUNTER — Encounter: Payer: Self-pay | Admitting: Unknown Physician Specialty

## 2016-12-01 ENCOUNTER — Inpatient Hospital Stay: Payer: BLUE CROSS/BLUE SHIELD | Attending: Oncology | Admitting: Oncology

## 2016-12-01 ENCOUNTER — Encounter: Payer: Self-pay | Admitting: Oncology

## 2016-12-01 VITALS — BP 158/115 | HR 101 | Temp 97.2°F | Resp 18 | Ht 67.6 in | Wt 162.5 lb

## 2016-12-01 DIAGNOSIS — C770 Secondary and unspecified malignant neoplasm of lymph nodes of head, face and neck: Secondary | ICD-10-CM | POA: Insufficient documentation

## 2016-12-01 DIAGNOSIS — E7800 Pure hypercholesterolemia, unspecified: Secondary | ICD-10-CM | POA: Diagnosis not present

## 2016-12-01 DIAGNOSIS — R400 Somnolence: Secondary | ICD-10-CM

## 2016-12-01 DIAGNOSIS — Z87891 Personal history of nicotine dependence: Secondary | ICD-10-CM | POA: Diagnosis not present

## 2016-12-01 DIAGNOSIS — Z85828 Personal history of other malignant neoplasm of skin: Secondary | ICD-10-CM | POA: Diagnosis not present

## 2016-12-01 DIAGNOSIS — K219 Gastro-esophageal reflux disease without esophagitis: Secondary | ICD-10-CM | POA: Diagnosis not present

## 2016-12-01 DIAGNOSIS — R221 Localized swelling, mass and lump, neck: Secondary | ICD-10-CM | POA: Diagnosis not present

## 2016-12-01 DIAGNOSIS — R42 Dizziness and giddiness: Secondary | ICD-10-CM

## 2016-12-01 DIAGNOSIS — C109 Malignant neoplasm of oropharynx, unspecified: Secondary | ICD-10-CM | POA: Diagnosis not present

## 2016-12-01 DIAGNOSIS — I10 Essential (primary) hypertension: Secondary | ICD-10-CM | POA: Insufficient documentation

## 2016-12-01 DIAGNOSIS — Z79899 Other long term (current) drug therapy: Secondary | ICD-10-CM | POA: Diagnosis not present

## 2016-12-01 DIAGNOSIS — Z8041 Family history of malignant neoplasm of ovary: Secondary | ICD-10-CM | POA: Insufficient documentation

## 2016-12-01 DIAGNOSIS — M542 Cervicalgia: Secondary | ICD-10-CM | POA: Diagnosis not present

## 2016-12-01 DIAGNOSIS — C4492 Squamous cell carcinoma of skin, unspecified: Secondary | ICD-10-CM

## 2016-12-01 DIAGNOSIS — C760 Malignant neoplasm of head, face and neck: Secondary | ICD-10-CM

## 2016-12-01 NOTE — Progress Notes (Signed)
Hematology/Oncology Consult note [facility] Telephone:([phone] Fax:([phone]) [phone]  Patient Care Team: Einar Pheasant, MD as PCP - General (Internal Medicine)   Name of the patient: Timothy Garrison  [id number]  [birth year]    Reason for referral- HPV positive oropharyngeal carcinoma   Referring physician- Dr. Con Memos  Date of visit: 12/01/16   History of presenting illness- patient is a 56 year old male who was seen by Dr. Con Memos in March 2018 or neck mass which has been persistent for 1 month. The mass developed suddenly and was fairly asymptomatic. Patient underwent a comprehensive ENT exam which showed asymmetric and enlarged right tonsil. 2 x 4 cm level II mass was noted on exam. CT soft tissue neck on 10/30/2016 showed necrotic level II lymph node measuring 4.7 cm x 2.7 cm. Very slight tonsillar asymmetry on the right raises the possibility of an occult right tonsillar carcinoma. Patient underwent core biopsy of the cervical lymph node which showed metastatic squamous cell carcinoma P16 positive. Patient has been referred to [place] for further management.  ECOG PS-0  Pain scale- 0   Review of systems- Review of Systems  Constitutional: Negative for chills, fever, malaise/fatigue and weight loss.  HENT: Negative for congestion, ear discharge and nosebleeds.        Right neck swelling  Eyes: Negative for blurred vision.  Respiratory: Negative for cough, hemoptysis, sputum production, shortness of breath and wheezing.   Cardiovascular: Negative for chest pain, palpitations, orthopnea and claudication.  Gastrointestinal: Negative for abdominal pain, blood in stool, constipation, diarrhea, heartburn, melena, nausea and vomiting.  Genitourinary: Negative for dysuria, flank pain, frequency, hematuria and urgency.  Musculoskeletal: Negative for back pain, joint pain and myalgias.  Skin: Negative for rash.  Neurological: Negative for dizziness, tingling,  focal weakness, seizures, weakness and headaches.  Endo/Heme/Allergies: Does not bruise/bleed easily.  Psychiatric/Behavioral: Negative for depression and suicidal ideas. The patient does not have insomnia.     No Known Allergies  Patient Active Problem List   Diagnosis Date Noted  . Neck mass 10/07/2016  . Health care maintenance 11/03/2014  . Daytime somnolence 04/15/2014  . Neck pain 09/16/2013  . GERD (gastroesophageal reflux disease) 01/23/2013  . Dizziness 01/23/2013  . Essential hypertension, benign 01/17/2013  . Hypercholesterolemia 01/17/2013     Past Medical History:  Diagnosis Date  . Basal cell carcinoma (BCC) of back   . Basal cell carcinoma (BCC) of nasal sidewall   . Dizziness   . GERD (gastroesophageal reflux disease)   . Hypercholesterolemia   . Hypertension    hx of HBP readings  . Metastatic squamous cell carcinoma ([place]) 2018   bx cervical lymph node     Past Surgical History:  Procedure Laterality Date  . COLONOSCOPY    . ESOPHAGOGASTRODUODENOSCOPY (EGD) WITH PROPOFOL N/A 05/24/2015   Procedure: ESOPHAGOGASTRODUODENOSCOPY (EGD) WITH PROPOFOL;  Surgeon: Manya Silvas, MD;  Location: [facility] ENDOSCOPY;  Service: Endoscopy;  Laterality: N/A;  . SAVORY DILATION N/A 05/24/2015   Procedure: SAVORY DILATION;  Surgeon: Manya Silvas, MD;  Location: [facility] ENDOSCOPY;  Service: Endoscopy;  Laterality: N/A;    Social History   Social History  . Marital status: Married    Spouse name: N/A  . Number of children: N/A  . Years of education: N/A   Occupational History  . Not on file.   Social History Main Topics  . Smoking status: Former Smoker    Quit date: 11/11/1996  . Smokeless tobacco: Never Used  . Alcohol  use 0.0 oz/week     Comment: occasionally  . Drug use: No  . Sexual activity: Yes   Other Topics Concern  . Not on file   Social History Narrative  . No narrative on file     Family History  Problem Relation Age of Onset  .  Hyperlipidemia Mother   . Heart disease Mother   . Hypertension Mother   . Alzheimer's disease Mother   . Prostate cancer Father   . Stroke Father      Current Outpatient Prescriptions:  .  lisinopril (PRINIVIL,ZESTRIL) 30 MG tablet, TAKE 1 TABLET (30 MG TOTAL) BY MOUTH DAILY., Disp: 90 tablet, Rfl: 3 .  Multiple Vitamins-Minerals (MULTIVITAMIN PO), Take by mouth daily., Disp: , Rfl:  .  omeprazole (PRILOSEC) 20 MG capsule, TAKE 1 CAPSULE (20 MG TOTAL) BY MOUTH 2 (TWO) TIMES DAILY., Disp: 180 capsule, Rfl: 3   Physical exam:  Vitals:   12/01/16 0938  BP: (!) 158/115  Pulse: (!) 101  Resp: 18  Temp: 97.2 F (36.2 C)  TempSrc: Tympanic  Weight: 162 lb 8 oz (73.7 kg)  Height: 5' 7.6" (1.717 m)   Physical Exam  Constitutional: He is oriented to person, place, and time and well-developed, well-nourished, and in no distress.  HENT:  Head: Normocephalic and atraumatic.  Palpable level II right cervical LN about 4 cm in size. No other palpabe adenopathy. Oropharynx appears normal  Eyes: EOM are normal. Pupils are equal, round, and reactive to light.  Neck: Normal range of motion.  Cardiovascular: Normal rate, regular rhythm and normal heart sounds.   Pulmonary/Chest: Effort normal and breath sounds normal.  Abdominal: Soft. Bowel sounds are normal.  Neurological: He is alert and oriented to person, place, and time.  Skin: Skin is warm and dry.       CMP Latest Ref Rng & Units 10/07/2016  Glucose 70 - 99 mg/dL -  BUN 6 - 23 mg/dL -  Creatinine 0.40 - 1.50 mg/dL 1.08  Sodium 135 - 145 mEq/L -  Potassium 3.5 - 5.1 mEq/L -  Chloride 96 - 112 mEq/L -  CO2 19 - 32 mEq/L -  Calcium 8.4 - 10.5 mg/dL -  Total Protein 6.0 - 8.3 g/dL -  Total Bilirubin 0.2 - 1.2 mg/dL -  Alkaline Phos 39 - 117 U/L -  AST 0 - 37 U/L -  ALT 0 - 53 U/L -   CBC Latest Ref Rng & Units 11/11/2016  WBC 3.8 - 10.6 K/uL 8.2  Hemoglobin 13.0 - 18.0 g/dL 13.7  Hematocrit 40.0 - 52.0 % 40.1  Platelets  150 - 440 K/uL 386    No images are attached to the encounter.  US Biopsy  Addendum Date: 11/11/2016   ADDENDUM REPORT: 11/11/2016 10:04 ADDENDUM: It should be noted hard copy ultrasound images were obtained during the needle placement to document positioning within the cervical lymph node. Electronically Signed   By: Inez Catalina M.D.   On: 11/11/2016 10:04   Result Date: 11/11/2016 INDICATION: Level 2 right cervical lymph node with possible tonsillar asymmetry, for biopsy evaluation EXAM: ULTRASOUND-GUIDED RIGHT CERVICAL LYMPH NODE BIOPSY MEDICATIONS: None. ANESTHESIA/SEDATION: None FLUOROSCOPY TIME:  Not applicable COMPLICATIONS: None immediate. PROCEDURE: Informed written consent was obtained from the patient after a thorough discussion of the procedural risks, benefits and alternatives. All questions were addressed. Maximal Sterile Barrier Technique was utilized including caps, mask, sterile gowns, sterile gloves, sterile drape, hand hygiene and skin antiseptic. A timeout was performed prior  to the initiation of the procedure. Utilizing real-time ultrasound guidance and 1% xylocaine as local anesthetic, an 18 gauge single action biopsy needle was passed into the solid component of the right cervical lymph node. Multiple core biopsies were obtained for pathologic evaluation. These were submitted for touch prep, standard pathology as well as possible lymphoma workup. The puncture site was dressed in the standard sterile manner. Patient tolerated the procedure well and was returned to his room in satisfactory condition. IMPRESSION: Successful ultrasound-guided right cervical lymph node biopsy as described above. Electronically Signed: By: Inez Catalina M.D. On: 11/11/2016 09:28    Assessment and plan- Patient is a 56 y.o. 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 I will discuss the patient at our tumor Board this week. If there is no evidence of distant disease, based on NCCN guidelines I would favor doing concurrent chemo/RT with weekly cisplatin at 40 mg/meter square concurrently with RT given that size of LN is >3 cm in size on CT. I will plan to get port placement if IV access for chemo is not possible and refer him to radiation Oncology as well. I discussed risks and benefits of chemotherapy including all but not limited to nausea, vomiting. Risk of low blood counts, infections, risk of peripheral neuropathy, hearing loss and kidney dysfunction associated with cisplatin. Patient understands and agrees to proceed. I will obtain baseline audiogram. I will refer him to dietician for nutritional counseling during treatment. We will hold off on Peg tube placement for now but consider if PO intake is poor during treatment. I stressed the importance of keeping up with hydration and PO intake during treatment. Chemo/RT will be given with a curative intent.I will see him back tentatively in 10 days on the day of starting chemotherapy. I will touch base with Dr. Con Memos if he plans to biopsy right tonsil to ascertain primary tumor  Thank you for this kind referral and the opportunity to participate in the care of this patient   Visit Diagnosis 1. Head and neck cancer ([place])   2. SCC (squamous cell carcinoma)   3. Neck mass     Dr. Randa Evens, MD, MPH [facility] Pager- [phone] 12/01/2016 10:29 AM

## 2016-12-01 NOTE — Progress Notes (Signed)
New pt today, nervous and white coat per pt and b/p elevated. syv

## 2016-12-02 NOTE — Patient Instructions (Signed)
Cisplatin injection  What is this medicine?  CISPLATIN (SIS pla tin) is a chemotherapy drug. It targets fast dividing cells, like cancer cells, and causes these cells to die. This medicine is used to treat many types of cancer like bladder, ovarian, and testicular cancers.  This medicine may be used for other purposes; ask your health care provider or pharmacist if you have questions.  COMMON BRAND NAME(S): Platinol, Platinol -AQ  What should I tell my health care provider before I take this medicine?  They need to know if you have any of these conditions:  -blood disorders  -hearing problems  -kidney disease  -recent or ongoing radiation therapy  -an unusual or allergic reaction to cisplatin, carboplatin, other chemotherapy, other medicines, foods, dyes, or preservatives  -pregnant or trying to get pregnant  -breast-feeding  How should I use this medicine?  This drug is given as an infusion into a vein. It is administered in a hospital or clinic by a specially trained health care professional.  Talk to your pediatrician regarding the use of this medicine in children. Special care may be needed.  Overdosage: If you think you have taken too much of this medicine contact a poison control center or emergency room at once.  NOTE: This medicine is only for you. Do not share this medicine with others.  What if I miss a dose?  It is important not to miss a dose. Call your doctor or health care professional if you are unable to keep an appointment.  What may interact with this medicine?  -dofetilide  -foscarnet  -medicines for seizures  -medicines to increase blood counts like filgrastim, pegfilgrastim, sargramostim  -probenecid  -pyridoxine used with altretamine  -rituximab  -some antibiotics like amikacin, gentamicin, neomycin, polymyxin B, streptomycin, tobramycin  -sulfinpyrazone  -vaccines  -zalcitabine  Talk to your doctor or health care professional before taking any of these  medicines:  -acetaminophen  -aspirin  -ibuprofen  -ketoprofen  -naproxen  This list may not describe all possible interactions. Give your health care provider a list of all the medicines, herbs, non-prescription drugs, or dietary supplements you use. Also tell them if you smoke, drink alcohol, or use illegal drugs. Some items may interact with your medicine.  What should I watch for while using this medicine?  Your condition will be monitored carefully while you are receiving this medicine. You will need important blood work done while you are taking this medicine.  This drug may make you feel generally unwell. This is not uncommon, as chemotherapy can affect healthy cells as well as cancer cells. Report any side effects. Continue your course of treatment even though you feel ill unless your doctor tells you to stop.  In some cases, you may be given additional medicines to help with side effects. Follow all directions for their use.  Call your doctor or health care professional for advice if you get a fever, chills or sore throat, or other symptoms of a cold or flu. Do not treat yourself. This drug decreases your body's ability to fight infections. Try to avoid being around people who are sick.  This medicine may increase your risk to bruise or bleed. Call your doctor or health care professional if you notice any unusual bleeding.  Be careful brushing and flossing your teeth or using a toothpick because you may get an infection or bleed more easily. If you have any dental work done, tell your dentist you are receiving this medicine.  Avoid taking products   that contain aspirin, acetaminophen, ibuprofen, naproxen, or ketoprofen unless instructed by your doctor. These medicines may hide a fever.  Do not become pregnant while taking this medicine. Women should inform their doctor if they wish to become pregnant or think they might be pregnant. There is a potential for serious side effects to an unborn child. Talk to  your health care professional or pharmacist for more information. Do not breast-feed an infant while taking this medicine.  Drink fluids as directed while you are taking this medicine. This will help protect your kidneys.  Call your doctor or health care professional if you get diarrhea. Do not treat yourself.  What side effects may I notice from receiving this medicine?  Side effects that you should report to your doctor or health care professional as soon as possible:  -allergic reactions like skin rash, itching or hives, swelling of the face, lips, or tongue  -signs of infection - fever or chills, cough, sore throat, pain or difficulty passing urine  -signs of decreased platelets or bleeding - bruising, pinpoint red spots on the skin, black, tarry stools, nosebleeds  -signs of decreased red blood cells - unusually weak or tired, fainting spells, lightheadedness  -breathing problems  -changes in hearing  -gout pain  -low blood counts - This drug may decrease the number of white blood cells, red blood cells and platelets. You may be at increased risk for infections and bleeding.  -nausea and vomiting  -pain, swelling, redness or irritation at the injection site  -pain, tingling, numbness in the hands or feet  -problems with balance, movement  -trouble passing urine or change in the amount of urine  Side effects that usually do not require medical attention (report to your doctor or health care professional if they continue or are bothersome):  -changes in vision  -loss of appetite  -metallic taste in the mouth or changes in taste  This list may not describe all possible side effects. Call your doctor for medical advice about side effects. You may report side effects to FDA at 1-800-FDA-1088.  Where should I keep my medicine?  This drug is given in a hospital or clinic and will not be stored at home.  NOTE: This sheet is a summary. It may not cover all possible information. If you have questions about this medicine,  talk to your doctor, pharmacist, or health care provider.   2018 Elsevier/Gold Standard (2007-11-08 14:40:54)

## 2016-12-03 ENCOUNTER — Telehealth: Payer: Self-pay

## 2016-12-03 NOTE — Telephone Encounter (Signed)
Informed pt of date and time for procedure

## 2016-12-04 ENCOUNTER — Other Ambulatory Visit: Payer: Self-pay | Admitting: *Deleted

## 2016-12-04 ENCOUNTER — Other Ambulatory Visit: Payer: Self-pay | Admitting: Oncology

## 2016-12-04 ENCOUNTER — Encounter: Payer: Self-pay | Admitting: Diagnostic Radiology

## 2016-12-04 ENCOUNTER — Ambulatory Visit: Payer: BLUE CROSS/BLUE SHIELD | Admitting: Cardiovascular Disease

## 2016-12-04 DIAGNOSIS — C109 Malignant neoplasm of oropharynx, unspecified: Secondary | ICD-10-CM

## 2016-12-07 ENCOUNTER — Ambulatory Visit
Admission: RE | Admit: 2016-12-07 | Discharge: 2016-12-07 | Disposition: A | Discharge status: Home / Self Care | Payer: BLUE CROSS/BLUE SHIELD | Source: Ambulatory Visit | Attending: Oncology | Admitting: Oncology

## 2016-12-07 ENCOUNTER — Other Ambulatory Visit: Payer: Self-pay | Admitting: General Surgery

## 2016-12-07 ENCOUNTER — Ambulatory Visit: Payer: BLUE CROSS/BLUE SHIELD

## 2016-12-07 ENCOUNTER — Other Ambulatory Visit: Payer: Self-pay | Admitting: Oncology

## 2016-12-07 DIAGNOSIS — J351 Hypertrophy of tonsils: Secondary | ICD-10-CM | POA: Diagnosis not present

## 2016-12-07 DIAGNOSIS — I2510 Atherosclerotic heart disease of native coronary artery without angina pectoris: Secondary | ICD-10-CM | POA: Insufficient documentation

## 2016-12-07 DIAGNOSIS — C801 Malignant (primary) neoplasm, unspecified: Secondary | ICD-10-CM | POA: Diagnosis not present

## 2016-12-07 DIAGNOSIS — I700 Atherosclerosis of aorta: Secondary | ICD-10-CM | POA: Diagnosis not present

## 2016-12-07 DIAGNOSIS — Z85819 Personal history of malignant neoplasm of unspecified site of lip, oral cavity, and pharynx: Secondary | ICD-10-CM | POA: Insufficient documentation

## 2016-12-07 DIAGNOSIS — C770 Secondary and unspecified malignant neoplasm of lymph nodes of head, face and neck: Secondary | ICD-10-CM | POA: Diagnosis not present

## 2016-12-07 DIAGNOSIS — C760 Malignant neoplasm of head, face and neck: Secondary | ICD-10-CM

## 2016-12-07 DIAGNOSIS — C109 Malignant neoplasm of oropharynx, unspecified: Secondary | ICD-10-CM | POA: Diagnosis not present

## 2016-12-07 DIAGNOSIS — D490 Neoplasm of unspecified behavior of digestive system: Secondary | ICD-10-CM

## 2016-12-07 LAB — GLUCOSE, CAPILLARY: GLUCOSE-CAPILLARY: 105 mg/dL — AB (ref 65–99)

## 2016-12-07 MED ORDER — LIDOCAINE-PRILOCAINE 2.5-2.5 % EX CREA: TOPICAL_CREAM | CUTANEOUS | 3 refills | Status: DC

## 2016-12-07 MED ORDER — LORAZEPAM 0.5 MG PO TABS: 0.5000 mg | ORAL_TABLET | Freq: Four times a day (QID) | ORAL | 0 refills | Status: DC | PRN

## 2016-12-07 MED ORDER — FLUDEOXYGLUCOSE F - 18 (FDG) INJECTION
12.0000 | Freq: Once | INTRAVENOUS | Status: AC | PRN
  Administered 2016-12-07: 13.43 via INTRAVENOUS

## 2016-12-07 MED ORDER — PROCHLORPERAZINE MALEATE 10 MG PO TABS: 10.0000 mg | ORAL_TABLET | Freq: Four times a day (QID) | ORAL | 1 refills | Status: DC | PRN

## 2016-12-07 MED ORDER — DEXAMETHASONE 4 MG PO TABS: ORAL_TABLET | ORAL | 1 refills | Status: DC

## 2016-12-07 MED ORDER — ONDANSETRON HCL 8 MG PO TABS: 8.0000 mg | ORAL_TABLET | Freq: Two times a day (BID) | ORAL | 1 refills | Status: DC | PRN

## 2016-12-07 NOTE — Progress Notes (Signed)
START ON PATHWAY REGIMEN - Head and Neck     Administer weekly:     Cisplatin   **Always confirm dose/schedule in your pharmacy ordering system**    Patient Characteristics: Oropharynx, HPV Positive, Pathologically Staged, T1-4, pN1-2 or T4, pN0 Disease Classification: Oropharynx HPV Status: Positive (+) AJCC N Category: pN1 AJCC 8 Stage Grouping: I Current Disease Status: No Distant Metastases and No Recurrent Disease AJCC T Category: T1 AJCC M Category: M0  Intent of Therapy: Curative Intent, Discussed with Patient

## 2016-12-08 ENCOUNTER — Ambulatory Visit
Admission: RE | Admit: 2016-12-08 | Discharge: 2016-12-08 | Disposition: A | Discharge status: Home / Self Care | Payer: BLUE CROSS/BLUE SHIELD | Source: Ambulatory Visit | Attending: Radiation Oncology | Admitting: Radiation Oncology

## 2016-12-08 ENCOUNTER — Inpatient Hospital Stay: Payer: BLUE CROSS/BLUE SHIELD

## 2016-12-08 ENCOUNTER — Other Ambulatory Visit: Payer: Self-pay | Admitting: Radiology

## 2016-12-08 ENCOUNTER — Encounter: Payer: Self-pay | Admitting: Radiation Oncology

## 2016-12-08 VITALS — BP 192/114 | HR 105 | Temp 96.5°F | Resp 18 | Wt 160.6 lb

## 2016-12-08 DIAGNOSIS — E7800 Pure hypercholesterolemia, unspecified: Secondary | ICD-10-CM | POA: Diagnosis not present

## 2016-12-08 DIAGNOSIS — C770 Secondary and unspecified malignant neoplasm of lymph nodes of head, face and neck: Secondary | ICD-10-CM | POA: Diagnosis not present

## 2016-12-08 DIAGNOSIS — I10 Essential (primary) hypertension: Secondary | ICD-10-CM | POA: Diagnosis not present

## 2016-12-08 DIAGNOSIS — Z85828 Personal history of other malignant neoplasm of skin: Secondary | ICD-10-CM | POA: Insufficient documentation

## 2016-12-08 DIAGNOSIS — C760 Malignant neoplasm of head, face and neck: Secondary | ICD-10-CM

## 2016-12-08 DIAGNOSIS — R42 Dizziness and giddiness: Secondary | ICD-10-CM | POA: Insufficient documentation

## 2016-12-08 DIAGNOSIS — Z510 Encounter for antineoplastic radiation therapy: Secondary | ICD-10-CM | POA: Diagnosis not present

## 2016-12-08 DIAGNOSIS — Z8042 Family history of malignant neoplasm of prostate: Secondary | ICD-10-CM | POA: Diagnosis not present

## 2016-12-08 DIAGNOSIS — Z79899 Other long term (current) drug therapy: Secondary | ICD-10-CM | POA: Diagnosis not present

## 2016-12-08 DIAGNOSIS — C090 Malignant neoplasm of tonsillar fossa: Secondary | ICD-10-CM | POA: Diagnosis not present

## 2016-12-08 DIAGNOSIS — Z87891 Personal history of nicotine dependence: Secondary | ICD-10-CM | POA: Diagnosis not present

## 2016-12-08 NOTE — Consult Note (Signed)
NEW PATIENT EVALUATION  Name: Timothy Garrison  MRN: [id number]  Date:   12/08/2016     DOB: [birth year]   This 56 y.o. 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  REFERRING PHYSICIAN: Einar Pheasant, MD  CHIEF COMPLAINT:  Chief Complaint  Patient presents with  . Cancer    Pt is here for initial consultation of head and neck cancer    DIAGNOSIS: The encounter diagnosis was Head and neck cancer ([place]).   PREVIOUS INVESTIGATIONS:  PET CT scan reviewed Clinical notes reviewed Surgical pathology report reviewed  HPI: Patient is a 56 year old male who presented over the past several months with an enlarging right midneck mass. Lesion was fairly asymptomatic and he was seen by ENT that showed an enlarged right tonsil measuring approximate 24 cm. He was also noted to have a mass in the right level II lymph node region which on CT scan performed March 16 showed 4.7 x 2.7 cm level II lymph node. Patient 1 core biopsy of the cervical lymph node which was positive for PE 16 positive squamous cell carcinoma. He underwent a PET CT scan showing isolated nodal metastasis in level II station of the right neck. Also asymmetric enlargement of hypermetabolic activity in the right [place] tonsil carcinoma to the likely area of primary site of malignancy. No other disease outside of the head and neck region was noted. Patient is fairly and systematic specifically denies dysphagia or head and neck pain. He is scheduled to have a port placed is seen today for consideration of concurrent chemoradiation. His weight has been fairly stable.  PLANNED TREATMENT REGIMEN: Concurrent chemoradiation with radiation being I M RT  PAST MEDICAL HISTORY:  has a past medical history of Basal cell carcinoma (BCC) of back; Basal cell carcinoma (BCC) of nasal sidewall; Dizziness; GERD (gastroesophageal reflux disease);  Hypercholesterolemia; Hypertension; and Metastatic squamous cell carcinoma ([place]) (2018).    PAST SURGICAL HISTORY:  Past Surgical History:  Procedure Laterality Date  . COLONOSCOPY    . ESOPHAGOGASTRODUODENOSCOPY (EGD) WITH PROPOFOL N/A 05/24/2015   Procedure: ESOPHAGOGASTRODUODENOSCOPY (EGD) WITH PROPOFOL;  Surgeon: Manya Silvas, MD;  Location: [facility] ENDOSCOPY;  Service: Endoscopy;  Laterality: N/A;  . SAVORY DILATION N/A 05/24/2015   Procedure: SAVORY DILATION;  Surgeon: Manya Silvas, MD;  Location: [facility] ENDOSCOPY;  Service: Endoscopy;  Laterality: N/A;    FAMILY HISTORY: family history includes Alzheimer's disease in his mother; Heart disease in his mother; Hyperlipidemia in his mother; Hypertension in his mother; Prostate cancer in his father; Stroke in his father.  SOCIAL HISTORY:  reports that he quit smoking about 20 years ago. He has never used smokeless tobacco. He reports that he drinks alcohol. He reports that he does not use drugs.  ALLERGIES: Patient has no known allergies.  MEDICATIONS:  Current Outpatient Prescriptions  Medication Sig Dispense Refill  . dexamethasone (DECADRON) 4 MG tablet Take 2 tablets by mouth once a day on the day after chemotherapy and then take 2 tablets two times a day for 2 days. Take with food. 30 tablet 1  . lidocaine-prilocaine (EMLA) cream Apply to affected area once 30 g 3  . lisinopril (PRINIVIL,ZESTRIL) 30 MG tablet TAKE 1 TABLET (30 MG TOTAL) BY MOUTH DAILY. 90 tablet 3  . LORazepam (ATIVAN) 0.5 MG tablet Take 1 tablet (0.5 mg total) by mouth every 6 (six) hours as needed (Nausea or  vomiting). 30 tablet 0  . Multiple Vitamins-Minerals (MULTIVITAMIN PO) Take by mouth daily.    Marland Kitchen omeprazole (PRILOSEC) 20 MG capsule TAKE 1 CAPSULE (20 MG TOTAL) BY MOUTH 2 (TWO) TIMES DAILY. 180 capsule 3  . ondansetron (ZOFRAN) 8 MG tablet Take 1 tablet (8 mg total) by mouth 2 (two) times daily as needed. Start on the third day after chemotherapy. 30 tablet  1  . prochlorperazine (COMPAZINE) 10 MG tablet Take 1 tablet (10 mg total) by mouth every 6 (six) hours as needed (Nausea or vomiting). 30 tablet 1   No current facility-administered medications for this encounter.     ECOG PERFORMANCE STATUS:  0 - Asymptomatic  REVIEW OF SYSTEMS:  Patient denies any weight loss, fatigue, weakness, fever, chills or night sweats. Patient denies any loss of vision, blurred vision. Patient denies any ringing  of the ears or hearing loss. No irregular heartbeat. Patient denies heart murmur or history of fainting. Patient denies any chest pain or pain radiating to her upper extremities. Patient denies any shortness of breath, difficulty breathing at night, cough or hemoptysis. Patient denies any swelling in the lower legs. Patient denies any nausea vomiting, vomiting of blood, or coffee ground material in the vomitus. Patient denies any stomach pain. Patient states has had normal bowel movements no significant constipation or diarrhea. Patient denies any dysuria, hematuria or significant nocturia. Patient denies any problems walking, swelling in the joints or loss of balance. Patient denies any skin changes, loss of hair or loss of weight. Patient denies any excessive worrying or anxiety or significant depression. Patient denies any problems with insomnia. Patient denies excessive thirst, polyuria, polydipsia. Patient denies any swollen glands, patient denies easy bruising or easy bleeding. Patient denies any recent infections, allergies or URI. Patient "s visual fields have not changed significantly in recent time.    PHYSICAL EXAM: BP (!) 192/114   Pulse (!) 105   Temp (!) 96.5 F (35.8 C)   Resp 18   Wt 160 lb 9.7 oz (72.8 kg)   BMI 24.71 kg/m  Oral cavity is clear. Teeth are in good state of repair. Right tonsillar region has approximately 2 cm mass with some asymmetry of the posterior oropharynx. Indirect mirror examination shows upper airway clear vallecula and  base of tongue within normal limits. He has a fixed node present in the right level II region. Remainder of neck exam is unremarkable. No supraclavicular adenopathy is identified. Well-developed well-nourished patient in NAD. HEENT reveals PERLA, EOMI, discs not visualized.  Oral cavity is clear. No oral mucosal lesions are identified. Neck is clear without evidence of cervical or supraclavicular adenopathy. Lungs are clear to A&P. Cardiac examination is essentially unremarkable with regular rate and rhythm without murmur rub or thrill. Abdomen is benign with no organomegaly or masses noted. Motor sensory and DTR levels are equal and symmetric in the upper and lower extremities. Cranial nerves II through XII are grossly intact. Proprioception is intact. No peripheral adenopathy or edema is identified. No motor or sensory levels are noted. Crude visual fields are within normal range.  LABORATORY DATA: Pathology reports reviewed    RADIOLOGY RESULTS: PET CT scan reviewed and compatible above-stated findings   IMPRESSION: Stage IV AP 16 positive squamous cell carcinoma the right tonsil in 57 year old male  PLAN: At this time I like to go ahead with concurrent chemoradiation. Would plan on delivering 7000 cGy to the area of the right palatine tonsil is right well as the right cervical node  involvement. I would treat the remainder of neck nodes up to 5400 cGy using I M RT dose painting technique. Would also use PET CT fusion study for my treatment planning. Risks and benefits of treatment including sore throat dysphasia skin reaction fatigue alteration of blood counts possible xerostomia and alteration of taste all were discussed in detail with the patient. Patient family seem to comprehend my treatment plan well. He is scheduled for port placement later this week and I've set him up for CT simulation for early next week.There will be extra effort by both professional staff as well as technical staff to  coordinate and manage concurrent chemoradiation and ensuing side effects during his treatments. Patient family seem to comprehend my treatment plan well.  I would like to take this opportunity to thank you for allowing me to participate in the care of your patient.Armstead Peaks., MD

## 2016-12-09 ENCOUNTER — Ambulatory Visit
Admission: RE | Admit: 2016-12-09 | Discharge: 2016-12-09 | Disposition: A | Discharge status: Home / Self Care | Payer: BLUE CROSS/BLUE SHIELD | Source: Ambulatory Visit | Attending: Oncology | Admitting: Oncology

## 2016-12-09 DIAGNOSIS — Z87891 Personal history of nicotine dependence: Secondary | ICD-10-CM | POA: Insufficient documentation

## 2016-12-09 DIAGNOSIS — E7800 Pure hypercholesterolemia, unspecified: Secondary | ICD-10-CM | POA: Insufficient documentation

## 2016-12-09 DIAGNOSIS — K219 Gastro-esophageal reflux disease without esophagitis: Secondary | ICD-10-CM | POA: Diagnosis not present

## 2016-12-09 DIAGNOSIS — Z452 Encounter for adjustment and management of vascular access device: Secondary | ICD-10-CM | POA: Diagnosis not present

## 2016-12-09 DIAGNOSIS — C760 Malignant neoplasm of head, face and neck: Secondary | ICD-10-CM

## 2016-12-09 DIAGNOSIS — I10 Essential (primary) hypertension: Secondary | ICD-10-CM | POA: Diagnosis not present

## 2016-12-09 DIAGNOSIS — C099 Malignant neoplasm of tonsil, unspecified: Secondary | ICD-10-CM | POA: Insufficient documentation

## 2016-12-09 HISTORY — PX: IR IMAGING GUIDED PORT INSERTION: IMG5740

## 2016-12-09 LAB — CBC WITH DIFFERENTIAL/PLATELET
Basophils Absolute: 0.1 10*3/uL (ref 0–0.1)
Basophils Relative: 1 %
EOS PCT: 2 %
Eosinophils Absolute: 0.2 10*3/uL (ref 0–0.7)
HCT: 40 % (ref 40.0–52.0)
Hemoglobin: 13.5 g/dL (ref 13.0–18.0)
LYMPHS ABS: 3.6 10*3/uL (ref 1.0–3.6)
LYMPHS PCT: 40 %
MCH: 28.1 pg (ref 26.0–34.0)
MCHC: 33.8 g/dL (ref 32.0–36.0)
MCV: 83.2 fL (ref 80.0–100.0)
Monocytes Absolute: 0.7 10*3/uL (ref 0.2–1.0)
Monocytes Relative: 8 %
Neutro Abs: 4.5 10*3/uL (ref 1.4–6.5)
Neutrophils Relative %: 49 %
PLATELETS: 357 10*3/uL (ref 150–440)
RBC: 4.81 MIL/uL (ref 4.40–5.90)
RDW: 13.6 % (ref 11.5–14.5)
WBC: 9 10*3/uL (ref 3.8–10.6)

## 2016-12-09 LAB — PROTIME-INR
INR: 0.97
Prothrombin Time: 12.9 seconds (ref 11.4–15.2)

## 2016-12-09 LAB — APTT: aPTT: 30 seconds (ref 24–36)

## 2016-12-09 MED ORDER — CEFAZOLIN SODIUM-DEXTROSE 2-4 GM/100ML-% IV SOLN
2.0000 g | INTRAVENOUS | Status: DC
  Administered 2016-12-09: 2 g via INTRAVENOUS

## 2016-12-09 MED ORDER — DEXTROSE 5 % IV SOLN
2.0000 g | INTRAVENOUS | Status: DC
  Filled 2016-12-09: qty 2000

## 2016-12-09 MED ORDER — MIDAZOLAM HCL 2 MG/2ML IJ SOLN
INTRAMUSCULAR | Status: AC | PRN
  Administered 2016-12-09 (×2): 1 mg via INTRAVENOUS

## 2016-12-09 MED ORDER — SODIUM CHLORIDE 0.9 % IV SOLN: INTRAVENOUS | Status: DC

## 2016-12-09 MED ORDER — FENTANYL CITRATE (PF) 100 MCG/2ML IJ SOLN
INTRAMUSCULAR | Status: AC | PRN
  Administered 2016-12-09 (×2): 50 ug via INTRAVENOUS

## 2016-12-09 NOTE — H&P (Signed)
Chief Complaint: Patient was seen in consultation today for [facility] A Cath placement at the request of [place] C  Referring Physician(s): Rao,Archana C  Supervising Physician: Daryll Brod  Patient Status: [facility] - Out-pt  History of Present Illness: Timothy Garrison is a 56 y.o. male   New dx squamous cell tonsillar cancer Noted enlarging right neck mass 6-8 weeks ago Bx: +sq cell cancer Scheduled to start chemotherapy Needs PAC for access   Past Medical History:  Diagnosis Date  . Basal cell carcinoma (BCC) of back   . Basal cell carcinoma (BCC) of nasal sidewall   . Dizziness   . GERD (gastroesophageal reflux disease)   . Hypercholesterolemia   . Hypertension    hx of HBP readings  . Metastatic squamous cell carcinoma ([place]) 2018   bx cervical lymph node    Past Surgical History:  Procedure Laterality Date  . COLONOSCOPY    . ESOPHAGOGASTRODUODENOSCOPY (EGD) WITH PROPOFOL N/A 05/24/2015   Procedure: ESOPHAGOGASTRODUODENOSCOPY (EGD) WITH PROPOFOL;  Surgeon: Manya Silvas, MD;  Location: [facility] ENDOSCOPY;  Service: Endoscopy;  Laterality: N/A;  . SAVORY DILATION N/A 05/24/2015   Procedure: SAVORY DILATION;  Surgeon: Manya Silvas, MD;  Location: [facility] ENDOSCOPY;  Service: Endoscopy;  Laterality: N/A;    Allergies: Patient has no known allergies.  Medications: Prior to Admission medications   Medication Sig Start Date End Date Taking? Authorizing Provider  dexamethasone (DECADRON) 4 MG tablet Take 2 tablets by mouth once a day on the day after chemotherapy and then take 2 tablets two times a day for 2 days. Take with food. 12/07/16   Sindy Guadeloupe, MD  lidocaine-prilocaine (EMLA) cream Apply to affected area once 12/07/16   Sindy Guadeloupe, MD  lisinopril (PRINIVIL,ZESTRIL) 30 MG tablet TAKE 1 TABLET (30 MG TOTAL) BY MOUTH DAILY. 12/21/15   Einar Pheasant, MD  LORazepam (ATIVAN) 0.5 MG tablet Take 1 tablet (0.5 mg total) by mouth every 6 (six) hours as needed (Nausea  or vomiting). 12/07/16   Sindy Guadeloupe, MD  Multiple Vitamins-Minerals (MULTIVITAMIN PO) Take by mouth daily.    Historical Provider, MD  omeprazole (PRILOSEC) 20 MG capsule TAKE 1 CAPSULE (20 MG TOTAL) BY MOUTH 2 (TWO) TIMES DAILY. 03/23/16   Einar Pheasant, MD  ondansetron (ZOFRAN) 8 MG tablet Take 1 tablet (8 mg total) by mouth 2 (two) times daily as needed. Start on the third day after chemotherapy. 12/07/16   Sindy Guadeloupe, MD  prochlorperazine (COMPAZINE) 10 MG tablet Take 1 tablet (10 mg total) by mouth every 6 (six) hours as needed (Nausea or vomiting). 12/07/16   Sindy Guadeloupe, MD     Family History  Problem Relation Age of Onset  . Hyperlipidemia Mother   . Heart disease Mother   . Hypertension Mother   . Alzheimer's disease Mother   . Prostate cancer Father   . Stroke Father     Social History   Social History  . Marital status: Married    Spouse name: N/A  . Number of children: N/A  . Years of education: N/A   Social History Main Topics  . Smoking status: Former Smoker    Quit date: 11/11/1996  . Smokeless tobacco: Never Used  . Alcohol use 0.0 oz/week     Comment: occasionally  . Drug use: No  . Sexual activity: Yes   Other Topics Concern  . None   Social History Narrative  . None    Review of Systems:  A 12 point ROS discussed and pertinent positives are indicated in the HPI above.  All other systems are negative.  Review of Systems  Constitutional: Negative for activity change, appetite change, fatigue and fever.  HENT: Negative for trouble swallowing.   Respiratory: Negative for cough and choking.   Psychiatric/Behavioral: Negative for behavioral problems and confusion.    Vital Signs: BP (!) 133/98   Pulse 81   Temp 98.8 F (37.1 C)   Resp (!) 28   SpO2 96%   Physical Exam  Constitutional: He is oriented to person, place, and time.  Neck:  Visible; palpable right neck mass  Cardiovascular: Normal rate, regular rhythm and normal heart sounds.     Pulmonary/Chest: Breath sounds normal.  Abdominal: Soft. Bowel sounds are normal.  Musculoskeletal: Normal range of motion.  Neurological: He is alert and oriented to person, place, and time.  Skin: Skin is warm and dry.  Psychiatric: He has a normal mood and affect. His behavior is normal. Judgment and thought content normal.  Nursing note and vitals reviewed.   Mallampati Score:  MD Evaluation Airway: WNL Heart: WNL Abdomen: WNL Chest/ Lungs: WNL ASA  Classification: 2 Mallampati/Airway Score: One  Imaging: Nm Pet Image Initial (pi) Skull Base To Thigh  Result Date: 12/07/2016 CLINICAL DATA:  Initial treatment strategy for squamous cell carcinoma of the oropharynx. Right-sided neck biopsy 2 weeks ago. EXAM: NUCLEAR MEDICINE PET SKULL BASE TO THIGH TECHNIQUE: 13.4 mCi F-18 FDG was injected intravenously. Full-ring PET imaging was performed from the skull base to thigh after the radiotracer. CT data was obtained and used for attenuation correction and anatomic localization. FASTING BLOOD GLUCOSE:  Value: 105 mg/dl COMPARISON:  Neck CT 10/30/2016. FINDINGS: NECK Hypermetabolism corresponding to the previously described right-sided level 2 node. This measures 2.5 cm and a S.U.V. max of 7.8 on image 39/series 3. Areas of photopenia within are likely related to necrosis. Minimal asymmetry involving the palatine tonsils as detailed on prior CT. The right palatine tonsil measures a S.U.V. max of 5.1 and the left palatine tonsil measures a S.U.V. max of 4.6. No other hypermetabolic cervical nodes. Bilateral carotid atherosclerosis. CHEST No pulmonary parenchymal or thoracic nodal hypermetabolism. Aortic and branch vessel atherosclerosis. Mild cardiomegaly. Multivessel coronary artery atherosclerosis. ABDOMEN/PELVIS No abdominopelvic nodal or parenchymal hypermetabolism. Abdominal aortic and branch vessel atherosclerosis. Normal adrenal glands. SKELETON No abnormal marrow activity. No focal osseous  lesion. IMPRESSION: 1. Isolated nodal metastasis in the right level 2 station. 2. Subtle asymmetric enlargement and hypermetabolism of the right palatine tonsil . This likely corresponds to the primary lesion detailed on clinic notes. 3. No extracervical disease identified. 4. Age advanced coronary artery atherosclerosis. Recommend assessment of coronary risk factors and consideration of medical therapy. 5.  Aortic atherosclerosis. Electronically Signed   By: Abigail Miyamoto M.D.   On: 12/07/2016 09:42   US Biopsy  Addendum Date: 11/11/2016   ADDENDUM REPORT: 11/11/2016 10:04 ADDENDUM: It should be noted hard copy ultrasound images were obtained during the needle placement to document positioning within the cervical lymph node. Electronically Signed   By: Inez Catalina M.D.   On: 11/11/2016 10:04   Result Date: 11/11/2016 INDICATION: Level 2 right cervical lymph node with possible tonsillar asymmetry, for biopsy evaluation EXAM: ULTRASOUND-GUIDED RIGHT CERVICAL LYMPH NODE BIOPSY MEDICATIONS: None. ANESTHESIA/SEDATION: None FLUOROSCOPY TIME:  Not applicable COMPLICATIONS: None immediate. PROCEDURE: Informed written consent was obtained from the patient after a thorough discussion of the procedural risks, benefits and alternatives. All questions were addressed.  Maximal Sterile Barrier Technique was utilized including caps, mask, sterile gowns, sterile gloves, sterile drape, hand hygiene and skin antiseptic. A timeout was performed prior to the initiation of the procedure. Utilizing real-time ultrasound guidance and 1% xylocaine as local anesthetic, an 18 gauge single action biopsy needle was passed into the solid component of the right cervical lymph node. Multiple core biopsies were obtained for pathologic evaluation. These were submitted for touch prep, standard pathology as well as possible lymphoma workup. The puncture site was dressed in the standard sterile manner. Patient tolerated the procedure well and was  returned to his room in satisfactory condition. IMPRESSION: Successful ultrasound-guided right cervical lymph node biopsy as described above. Electronically Signed: By: Inez Catalina M.D. On: 11/11/2016 09:28    Labs:  CBC:  Recent Labs  10/07/16 1222 10/23/16 0942 11/11/16 0737 12/09/16 1245  WBC 9.3 7.3 8.2 9.0  HGB 12.8* 12.9* 13.7 13.5  HCT 39.0 39.2 40.1 40.0  PLT 389.0 378.0 386 357    COAGS:  Recent Labs  11/11/16 0737 12/09/16 1245  INR 0.95 0.97  APTT 28 30    BMP:  Recent Labs  07/17/16 0847 07/24/16 0943 10/07/16 1222  NA 138  --   --   K 5.2* 4.4  --   CL 102  --   --   CO2 29  --   --   GLUCOSE 100*  --   --   BUN 18  --   --   CALCIUM 9.8  --   --   CREATININE 1.07  --  1.08    LIVER FUNCTION TESTS:  Recent Labs  07/17/16 0847  BILITOT 0.5  AST 23  ALT 31  ALKPHOS 50  PROT 7.5  ALBUMIN 4.4    TUMOR MARKERS: No results for input(s): AFPTM, CEA, CA199, CHROMGRNA in the last 8760 hours.  Assessment and Plan:  Squamous cell tonsillar cancer For PAC placement today Risks and Benefits discussed with the patient including, but not limited to bleeding, infection, pneumothorax, or fibrin sheath development and need for additional procedures. All of the patient's questions were answered, patient is agreeable to proceed. Consent signed and in chart.   Thank you for this interesting consult.  I greatly enjoyed meeting Timothy Garrison and look forward to participating in their care.  A copy of this report was sent to the requesting provider on this date.  Electronically Signed: Monia Sabal A 12/09/2016, 1:19 PM   I spent a total of  30 Minutes   in face to face in clinical consultation, greater than 50% of which was counseling/coordinating care for [facility] placement

## 2016-12-09 NOTE — Procedures (Signed)
Tonsillar ca  s/p RT IJ POWER PORT  TIP SVC RA NO COMP STABLE ACCESS READY FOR USE FULL REPORT IN PACS

## 2016-12-10 ENCOUNTER — Inpatient Hospital Stay: Payer: BLUE CROSS/BLUE SHIELD

## 2016-12-10 NOTE — Progress Notes (Signed)
Nutrition Assessment   Reason for Assessment:   New head and neck cancer  ASSESSMENT:  56 year old male with P16 positive squamous cell tonsillar cancer.  Planning chemotherapy and radiation therapy.  Past medical history of basal cell carcinoma, GERD, HTN, HLD, Etoh use  Patient seen in clinic today with daughter.  Patient reports good/normal appetite.  Patient reports no problems with swallowing foods. Typically has grilled chicken on english muffin for breakfast, chicken meatballs with pasta for lunch or meat and vegetables and meat and vegetables for dinner.  Enjoys ice cream and other sweet treats as well.  Overall, no change in appetite or amount eaten at this time.   Nutrition Focused Physical Exam: Nutrition-Focused physical exam completed. Findings are no fat depletion, no muscle depletion, and no edema.   Medications: MVI, prilosec, zofran, compazine  Labs: reviewed  Anthropometrics:   Height: 67 inches Weight: 160 lb UBW: 160-165 lb BMI: 24  Stable weight per patient.   Estimated Energy Needs  Kcals: 2100-2500 calories/d Protein: 88-110 g/d Fluid: 2.5 L/d  NUTRITION DIAGNOSIS: Food and nutrition related knowledge deficit related to new diagnosis of cancer as evidenced by patient not aware of foods to eat during treatment and with treatment side effects.   MALNUTRITION DIAGNOSIS: none at this time   INTERVENTION:   Discussed nutrition and importance of nutrition during treatment. Encouraged patient to began trying and keeping on hand oral nutrition supplements.  Discussed the difference between supplements.  Samples provided and coupons.   Discussed strategies to increase calories and protein.  Daughter will be making food for patient and encouraging intake.      MONITORING, EVALUATION, GOAL: Patient will consume adequate calories and protein to maintain weight   NEXT VISIT: May 10, RN [name] to call patient to remind of appointment  Avriana Joo B. Zenia Resides, [place],  [place] Registered Dietitian [phone] (pager)

## 2016-12-11 ENCOUNTER — Inpatient Hospital Stay: Payer: BLUE CROSS/BLUE SHIELD

## 2016-12-14 ENCOUNTER — Ambulatory Visit
Admission: RE | Admit: 2016-12-14 | Discharge: 2016-12-14 | Disposition: A | Discharge status: Home / Self Care | Payer: BLUE CROSS/BLUE SHIELD | Source: Ambulatory Visit | Attending: Radiation Oncology | Admitting: Radiation Oncology

## 2016-12-14 DIAGNOSIS — Z510 Encounter for antineoplastic radiation therapy: Secondary | ICD-10-CM | POA: Diagnosis not present

## 2016-12-14 DIAGNOSIS — Z8042 Family history of malignant neoplasm of prostate: Secondary | ICD-10-CM | POA: Diagnosis not present

## 2016-12-14 DIAGNOSIS — C090 Malignant neoplasm of tonsillar fossa: Secondary | ICD-10-CM | POA: Diagnosis not present

## 2016-12-14 DIAGNOSIS — I10 Essential (primary) hypertension: Secondary | ICD-10-CM | POA: Diagnosis not present

## 2016-12-14 DIAGNOSIS — E7800 Pure hypercholesterolemia, unspecified: Secondary | ICD-10-CM | POA: Diagnosis not present

## 2016-12-14 DIAGNOSIS — Z79899 Other long term (current) drug therapy: Secondary | ICD-10-CM | POA: Diagnosis not present

## 2016-12-14 DIAGNOSIS — H903 Sensorineural hearing loss, bilateral: Secondary | ICD-10-CM | POA: Diagnosis not present

## 2016-12-14 DIAGNOSIS — Z87891 Personal history of nicotine dependence: Secondary | ICD-10-CM | POA: Diagnosis not present

## 2016-12-14 DIAGNOSIS — C770 Secondary and unspecified malignant neoplasm of lymph nodes of head, face and neck: Secondary | ICD-10-CM | POA: Diagnosis not present

## 2016-12-14 DIAGNOSIS — R42 Dizziness and giddiness: Secondary | ICD-10-CM | POA: Diagnosis not present

## 2016-12-14 DIAGNOSIS — Z85828 Personal history of other malignant neoplasm of skin: Secondary | ICD-10-CM | POA: Diagnosis not present

## 2016-12-17 DIAGNOSIS — C770 Secondary and unspecified malignant neoplasm of lymph nodes of head, face and neck: Secondary | ICD-10-CM | POA: Diagnosis not present

## 2016-12-17 DIAGNOSIS — I10 Essential (primary) hypertension: Secondary | ICD-10-CM | POA: Diagnosis not present

## 2016-12-17 DIAGNOSIS — C090 Malignant neoplasm of tonsillar fossa: Secondary | ICD-10-CM | POA: Diagnosis not present

## 2016-12-17 DIAGNOSIS — E7800 Pure hypercholesterolemia, unspecified: Secondary | ICD-10-CM | POA: Diagnosis not present

## 2016-12-17 DIAGNOSIS — Z510 Encounter for antineoplastic radiation therapy: Secondary | ICD-10-CM | POA: Diagnosis not present

## 2016-12-17 DIAGNOSIS — Z79899 Other long term (current) drug therapy: Secondary | ICD-10-CM | POA: Diagnosis not present

## 2016-12-17 DIAGNOSIS — Z85828 Personal history of other malignant neoplasm of skin: Secondary | ICD-10-CM | POA: Diagnosis not present

## 2016-12-17 DIAGNOSIS — Z87891 Personal history of nicotine dependence: Secondary | ICD-10-CM | POA: Diagnosis not present

## 2016-12-17 DIAGNOSIS — Z8042 Family history of malignant neoplasm of prostate: Secondary | ICD-10-CM | POA: Diagnosis not present

## 2016-12-17 DIAGNOSIS — R42 Dizziness and giddiness: Secondary | ICD-10-CM | POA: Diagnosis not present

## 2016-12-19 ENCOUNTER — Other Ambulatory Visit: Payer: Self-pay | Admitting: Internal Medicine

## 2016-12-21 ENCOUNTER — Telehealth: Payer: Self-pay | Admitting: *Deleted

## 2016-12-21 MED ORDER — DEXLANSOPRAZOLE 60 MG PO CPDR: 60.0000 mg | DELAYED_RELEASE_CAPSULE | Freq: Every day | ORAL | 1 refills | Status: DC

## 2016-12-21 NOTE — Telephone Encounter (Signed)
Patient has not started XRT yet, he reports it is down his esophagus, I will order the dexilant for him.

## 2016-12-21 NOTE — Telephone Encounter (Signed)
States since Thursday his throat is burning like acid. He states he is not eating fried or spicy foods and he takes omeprazole 20 mg BID. Asking what can be done about it. He can't eat with this and states he does not want to go into chemo not being able to eat. Please advise

## 2016-12-21 NOTE — Telephone Encounter (Signed)
Thank you :)

## 2016-12-21 NOTE — Telephone Encounter (Signed)
Is his throat hurting or is it heart burn symptoms lower down in his esophagus. If it is acid reflux symptoms he can switch to dexilant 60 mg and use prn tums to see if it helps. If he is having throat pain higher up from radiation and not reflux, we can give pain meds

## 2016-12-23 ENCOUNTER — Ambulatory Visit
Admission: RE | Admit: 2016-12-23 | Discharge: 2016-12-23 | Disposition: A | Discharge status: Home / Self Care | Payer: BLUE CROSS/BLUE SHIELD | Source: Ambulatory Visit | Attending: Radiation Oncology | Admitting: Radiation Oncology

## 2016-12-24 ENCOUNTER — Inpatient Hospital Stay: Payer: BLUE CROSS/BLUE SHIELD

## 2016-12-24 ENCOUNTER — Ambulatory Visit
Admission: RE | Admit: 2016-12-24 | Discharge: 2016-12-24 | Disposition: A | Discharge status: Home / Self Care | Payer: BLUE CROSS/BLUE SHIELD | Source: Ambulatory Visit | Attending: Radiation Oncology | Admitting: Radiation Oncology

## 2016-12-24 DIAGNOSIS — I10 Essential (primary) hypertension: Secondary | ICD-10-CM | POA: Diagnosis not present

## 2016-12-24 DIAGNOSIS — I6523 Occlusion and stenosis of bilateral carotid arteries: Secondary | ICD-10-CM | POA: Insufficient documentation

## 2016-12-24 DIAGNOSIS — Z87891 Personal history of nicotine dependence: Secondary | ICD-10-CM | POA: Insufficient documentation

## 2016-12-24 DIAGNOSIS — Z5111 Encounter for antineoplastic chemotherapy: Secondary | ICD-10-CM | POA: Insufficient documentation

## 2016-12-24 DIAGNOSIS — Z510 Encounter for antineoplastic radiation therapy: Secondary | ICD-10-CM | POA: Diagnosis not present

## 2016-12-24 DIAGNOSIS — I2510 Atherosclerotic heart disease of native coronary artery without angina pectoris: Secondary | ICD-10-CM | POA: Insufficient documentation

## 2016-12-24 DIAGNOSIS — R12 Heartburn: Secondary | ICD-10-CM | POA: Insufficient documentation

## 2016-12-24 DIAGNOSIS — I700 Atherosclerosis of aorta: Secondary | ICD-10-CM | POA: Insufficient documentation

## 2016-12-24 DIAGNOSIS — E7800 Pure hypercholesterolemia, unspecified: Secondary | ICD-10-CM | POA: Diagnosis not present

## 2016-12-24 DIAGNOSIS — R252 Cramp and spasm: Secondary | ICD-10-CM | POA: Insufficient documentation

## 2016-12-24 DIAGNOSIS — G4700 Insomnia, unspecified: Secondary | ICD-10-CM | POA: Insufficient documentation

## 2016-12-24 DIAGNOSIS — Z79899 Other long term (current) drug therapy: Secondary | ICD-10-CM | POA: Insufficient documentation

## 2016-12-24 DIAGNOSIS — Z85828 Personal history of other malignant neoplasm of skin: Secondary | ICD-10-CM | POA: Diagnosis not present

## 2016-12-24 DIAGNOSIS — Z8042 Family history of malignant neoplasm of prostate: Secondary | ICD-10-CM | POA: Diagnosis not present

## 2016-12-24 DIAGNOSIS — C109 Malignant neoplasm of oropharynx, unspecified: Secondary | ICD-10-CM | POA: Insufficient documentation

## 2016-12-24 DIAGNOSIS — I517 Cardiomegaly: Secondary | ICD-10-CM | POA: Insufficient documentation

## 2016-12-24 DIAGNOSIS — C090 Malignant neoplasm of tonsillar fossa: Secondary | ICD-10-CM | POA: Diagnosis not present

## 2016-12-24 DIAGNOSIS — C770 Secondary and unspecified malignant neoplasm of lymph nodes of head, face and neck: Secondary | ICD-10-CM | POA: Insufficient documentation

## 2016-12-24 DIAGNOSIS — K219 Gastro-esophageal reflux disease without esophagitis: Secondary | ICD-10-CM | POA: Insufficient documentation

## 2016-12-24 DIAGNOSIS — R42 Dizziness and giddiness: Secondary | ICD-10-CM | POA: Diagnosis not present

## 2016-12-24 NOTE — Progress Notes (Addendum)
Nutrition Follow-up:  Nutrition follow-up completed with patient following first radiation treatment for P16 positive squamous cell tonsillar cancer. Planning first chemo treatment tomorrow.    Patient reports appetite is still good and eating regular foods.  Has had issue with heartburn/reflux over the last 2 weeks and medication changed to dexilant.  Patient still reports some reflux issues (to meet with MD tomorrow).  Has oral nutrition supplements on hand if needed.  Reports sore throat as well but still eating well.     Medications: MVI, dexilant, zofran, compazine  Labs: reviewed  Anthropometrics:   Weight checked today in clinic and was 159 lb, decreased slightly from 160 lb on 4/23 initial visit.    Estimated Energy Needs  Kcals: 2100-2500 calories/d Protein: 88-110 g/d Fluid: 2.5 L/d  NUTRITION DIAGNOSIS: Food and nutrition related knowledge deficit improved   MALNUTRITION DIAGNOSIS: none at this time   INTERVENTION:   Encouraged patient to continue to eat high calorie, high protein foods.   Patient has oral nutrition supplements on hand if needed.   Encouraged patient to take nausea medication if experiences any from chemotherapy. Patient may benefit from SLP evaluation.     MONITORING, EVALUATION, GOAL: Patient will consume adequate calories and protein to prevent weight loss   NEXT VISIT: May 14th after radiation  Timothy Garrison, [place], [place] Registered Dietitian [phone] (pager)

## 2016-12-25 ENCOUNTER — Encounter: Payer: Self-pay | Admitting: Oncology

## 2016-12-25 ENCOUNTER — Ambulatory Visit
Admission: RE | Admit: 2016-12-25 | Discharge: 2016-12-25 | Disposition: A | Discharge status: Home / Self Care | Payer: BLUE CROSS/BLUE SHIELD | Source: Ambulatory Visit | Attending: Radiation Oncology | Admitting: Radiation Oncology

## 2016-12-25 ENCOUNTER — Inpatient Hospital Stay: Payer: BLUE CROSS/BLUE SHIELD

## 2016-12-25 ENCOUNTER — Inpatient Hospital Stay: Payer: BLUE CROSS/BLUE SHIELD | Attending: Oncology | Admitting: Oncology

## 2016-12-25 ENCOUNTER — Ambulatory Visit: Payer: BLUE CROSS/BLUE SHIELD

## 2016-12-25 ENCOUNTER — Other Ambulatory Visit: Payer: BLUE CROSS/BLUE SHIELD

## 2016-12-25 VITALS — BP 142/95 | HR 88 | Temp 97.5°F | Resp 20 | Wt 158.5 lb

## 2016-12-25 DIAGNOSIS — I2510 Atherosclerotic heart disease of native coronary artery without angina pectoris: Secondary | ICD-10-CM | POA: Diagnosis not present

## 2016-12-25 DIAGNOSIS — Z8042 Family history of malignant neoplasm of prostate: Secondary | ICD-10-CM | POA: Diagnosis not present

## 2016-12-25 DIAGNOSIS — C109 Malignant neoplasm of oropharynx, unspecified: Secondary | ICD-10-CM

## 2016-12-25 DIAGNOSIS — D490 Neoplasm of unspecified behavior of digestive system: Secondary | ICD-10-CM

## 2016-12-25 DIAGNOSIS — I517 Cardiomegaly: Secondary | ICD-10-CM | POA: Diagnosis not present

## 2016-12-25 DIAGNOSIS — C779 Secondary and unspecified malignant neoplasm of lymph node, unspecified: Secondary | ICD-10-CM | POA: Diagnosis not present

## 2016-12-25 DIAGNOSIS — Z87891 Personal history of nicotine dependence: Secondary | ICD-10-CM

## 2016-12-25 DIAGNOSIS — I10 Essential (primary) hypertension: Secondary | ICD-10-CM | POA: Diagnosis not present

## 2016-12-25 DIAGNOSIS — R12 Heartburn: Secondary | ICD-10-CM | POA: Diagnosis not present

## 2016-12-25 DIAGNOSIS — I6523 Occlusion and stenosis of bilateral carotid arteries: Secondary | ICD-10-CM

## 2016-12-25 DIAGNOSIS — C760 Malignant neoplasm of head, face and neck: Secondary | ICD-10-CM

## 2016-12-25 DIAGNOSIS — E7800 Pure hypercholesterolemia, unspecified: Secondary | ICD-10-CM

## 2016-12-25 DIAGNOSIS — C090 Malignant neoplasm of tonsillar fossa: Secondary | ICD-10-CM | POA: Diagnosis not present

## 2016-12-25 DIAGNOSIS — Z5111 Encounter for antineoplastic chemotherapy: Secondary | ICD-10-CM | POA: Diagnosis not present

## 2016-12-25 DIAGNOSIS — Z85828 Personal history of other malignant neoplasm of skin: Secondary | ICD-10-CM | POA: Diagnosis not present

## 2016-12-25 DIAGNOSIS — K219 Gastro-esophageal reflux disease without esophagitis: Secondary | ICD-10-CM | POA: Diagnosis not present

## 2016-12-25 DIAGNOSIS — Z79899 Other long term (current) drug therapy: Secondary | ICD-10-CM | POA: Diagnosis not present

## 2016-12-25 DIAGNOSIS — R252 Cramp and spasm: Secondary | ICD-10-CM | POA: Diagnosis not present

## 2016-12-25 DIAGNOSIS — Z510 Encounter for antineoplastic radiation therapy: Secondary | ICD-10-CM | POA: Diagnosis not present

## 2016-12-25 DIAGNOSIS — I700 Atherosclerosis of aorta: Secondary | ICD-10-CM | POA: Diagnosis not present

## 2016-12-25 DIAGNOSIS — R42 Dizziness and giddiness: Secondary | ICD-10-CM | POA: Diagnosis not present

## 2016-12-25 DIAGNOSIS — G4700 Insomnia, unspecified: Secondary | ICD-10-CM | POA: Diagnosis not present

## 2016-12-25 DIAGNOSIS — Z7189 Other specified counseling: Secondary | ICD-10-CM | POA: Insufficient documentation

## 2016-12-25 DIAGNOSIS — C770 Secondary and unspecified malignant neoplasm of lymph nodes of head, face and neck: Secondary | ICD-10-CM | POA: Diagnosis not present

## 2016-12-25 LAB — CBC WITH DIFFERENTIAL/PLATELET
BASOS ABS: 0.1 10*3/uL (ref 0–0.1)
Basophils Relative: 1 %
Eosinophils Absolute: 0.3 10*3/uL (ref 0–0.7)
Eosinophils Relative: 3 %
HEMATOCRIT: 37 % — AB (ref 40.0–52.0)
HEMOGLOBIN: 12.8 g/dL — AB (ref 13.0–18.0)
LYMPHS PCT: 27 %
Lymphs Abs: 2.5 10*3/uL (ref 1.0–3.6)
MCH: 28.6 pg (ref 26.0–34.0)
MCHC: 34.7 g/dL (ref 32.0–36.0)
MCV: 82.5 fL (ref 80.0–100.0)
Monocytes Absolute: 0.9 10*3/uL (ref 0.2–1.0)
Monocytes Relative: 9 %
NEUTROS ABS: 5.5 10*3/uL (ref 1.4–6.5)
NEUTROS PCT: 60 %
PLATELETS: 358 10*3/uL (ref 150–440)
RBC: 4.48 MIL/uL (ref 4.40–5.90)
RDW: 13.9 % (ref 11.5–14.5)
WBC: 9.3 10*3/uL (ref 3.8–10.6)

## 2016-12-25 LAB — COMPREHENSIVE METABOLIC PANEL
ALK PHOS: 61 U/L (ref 38–126)
ALT: 38 U/L (ref 17–63)
AST: 35 U/L (ref 15–41)
Albumin: 4.2 g/dL (ref 3.5–5.0)
Anion gap: 4 — ABNORMAL LOW (ref 5–15)
BILIRUBIN TOTAL: 0.7 mg/dL (ref 0.3–1.2)
BUN: 24 mg/dL — AB (ref 6–20)
CHLORIDE: 104 mmol/L (ref 101–111)
CO2: 27 mmol/L (ref 22–32)
CREATININE: 0.96 mg/dL (ref 0.61–1.24)
Calcium: 9.4 mg/dL (ref 8.9–10.3)
GFR calc Af Amer: >60 mL/min (ref >60)
Glucose, Bld: 105 mg/dL — ABNORMAL HIGH (ref 65–99)
Potassium: 4 mmol/L (ref 3.5–5.1)
Sodium: 135 mmol/L (ref 135–145)
Total Protein: 7.9 g/dL (ref 6.5–8.1)

## 2016-12-25 MED ORDER — HEPARIN SOD (PORK) LOCK FLUSH 100 UNIT/ML IV SOLN
500.0000 [IU] | Freq: Once | INTRAVENOUS | Status: AC
  Administered 2016-12-25: 500 [IU] via INTRAVENOUS
  Filled 2016-12-25: qty 5

## 2016-12-25 MED ORDER — PALONOSETRON HCL INJECTION 0.25 MG/5ML
0.2500 mg | Freq: Once | INTRAVENOUS | Status: AC
  Administered 2016-12-25: 0.25 mg via INTRAVENOUS
  Filled 2016-12-25: qty 5

## 2016-12-25 MED ORDER — POTASSIUM CHLORIDE 2 MEQ/ML IV SOLN
Freq: Once | INTRAVENOUS | Status: AC
  Administered 2016-12-25: 10:00:00 via INTRAVENOUS
  Filled 2016-12-25: qty 1000

## 2016-12-25 MED ORDER — SUCRALFATE 1 G PO TABS: 1.0000 g | ORAL_TABLET | Freq: Three times a day (TID) | ORAL | 1 refills | Status: DC

## 2016-12-25 MED ORDER — SODIUM CHLORIDE 0.9% FLUSH
10.0000 mL | Freq: Once | INTRAVENOUS | Status: AC
  Administered 2016-12-25: 10 mL via INTRAVENOUS
  Filled 2016-12-25: qty 10

## 2016-12-25 MED ORDER — SODIUM CHLORIDE 0.9 % IV SOLN
Freq: Once | INTRAVENOUS | Status: AC
  Administered 2016-12-25: 12:00:00 via INTRAVENOUS
  Filled 2016-12-25: qty 5

## 2016-12-25 MED ORDER — SODIUM CHLORIDE 0.9 % IV SOLN
Freq: Once | INTRAVENOUS | Status: AC
  Administered 2016-12-25: 10:00:00 via INTRAVENOUS
  Filled 2016-12-25: qty 1000

## 2016-12-25 MED ORDER — SODIUM CHLORIDE 0.9 % IV SOLN
40.0000 mg/m2 | Freq: Once | INTRAVENOUS | Status: AC
  Administered 2016-12-25: 75 mg via INTRAVENOUS
  Filled 2016-12-25: qty 75

## 2016-12-25 NOTE — Progress Notes (Signed)
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 Con Memos in March 2018 or neck mass which has been persistent for 1 month. The mass developed suddenly and was fairly asymptomatic. Patient underwent a comprehensive ENT exam which showed asymmetric and enlarged right tonsil. 2 x 4 cm level II mass was noted on exam. CT soft tissue neck on 10/30/2016 showed necrotic level II lymph node measuring 4.7 cm x 2.7 cm. Very slight tonsillar asymmetry on the right raises the possibility of an occult right tonsillar carcinoma. Patient underwent core biopsy of the cervical lymph node which showed metastatic squamous cell carcinoma P16 positive. Patient has been referred to [place] for further management.  PET/CT on 12/07/16 showed: 1. Isolated nodal metastasis in the right level 2 station. 2. Subtle asymmetric enlargement and hypermetabolism of the right palatine tonsil . This likely corresponds to the primary lesion detailed on clinic notes. 3. No extracervical disease identified. 4. Age advanced coronary artery atherosclerosis. Recommend assessment of coronary risk factors and consideration of medical therapy. 5.  Aortic atherosclerosis.  Baseline audiogram on 12/14/2016 showed mild sensorineural hearing loss in his right ear and mild-to-moderate sensorineural hearing loss in his left ear. Decreased hearing as compared to 2010   Interval history- he has been  having significant heartburn issues and was on Prilosec twice a day which did not help. He was switched to [place] a few days ago which she says has been helping him a little bit but he continues to have problems with heartburn. Also this medication has been significantly expensive for him. Also reports problems with sleep   ECOG PS- 0 Pain scale- 0 Opioid associated constipation- no  Review of systems- Review of Systems  Constitutional: Negative for chills, fever, malaise/fatigue and weight loss.  HENT: Negative for congestion, ear discharge and nosebleeds.   Eyes: Negative for blurred vision.  Respiratory: Negative for cough, hemoptysis, sputum production, shortness of breath and wheezing.   Cardiovascular: Negative for chest pain, palpitations, orthopnea and claudication.  Gastrointestinal: Positive for heartburn. Negative for abdominal pain, blood in stool, constipation, diarrhea, melena, nausea and vomiting.  Genitourinary: Negative for dysuria, flank pain, frequency, hematuria and urgency.  Musculoskeletal: Negative for back pain, joint pain and myalgias.  Skin: Negative for rash.  Neurological: Negative for dizziness, tingling, focal weakness, seizures, weakness and headaches.  Endo/Heme/Allergies: Does not bruise/bleed easily.  Psychiatric/Behavioral: Negative for depression and suicidal ideas. The patient has insomnia.        No Known Allergies   Past Medical History:  Diagnosis Date  . Basal cell carcinoma (BCC) of back   . Basal cell carcinoma (BCC) of nasal sidewall   . Dizziness   . GERD (gastroesophageal reflux disease)   . Hypercholesterolemia   . Hypertension    hx of HBP readings  . Metastatic squamous cell carcinoma ([place]) 2018   bx cervical lymph node     Past Surgical History:  Procedure Laterality Date  . COLONOSCOPY    . ESOPHAGOGASTRODUODENOSCOPY (  EGD) WITH PROPOFOL N/A 05/24/2015   Procedure: ESOPHAGOGASTRODUODENOSCOPY (EGD) WITH PROPOFOL;  Surgeon:  Manya Silvas, MD;  Location: [facility] ENDOSCOPY;  Service: Endoscopy;  Laterality: N/A;  . IR FLUORO GUIDE PORT INSERTION LEFT  12/09/2016  . SAVORY DILATION N/A 05/24/2015   Procedure: SAVORY DILATION;  Surgeon: Manya Silvas, MD;  Location: [facility] ENDOSCOPY;  Service: Endoscopy;  Laterality: N/A;    Social History   Social History  . Marital status: Married    Spouse name: N/A  . Number of children: N/A  . Years of education: N/A   Occupational History  . Not on file.   Social History Main Topics  . Smoking status: Former Smoker    Quit date: 11/11/1996  . Smokeless tobacco: Never Used  . Alcohol use 0.0 oz/week     Comment: occasionally  . Drug use: No  . Sexual activity: Yes   Other Topics Concern  . Not on file   Social History Narrative  . No narrative on file    Family History  Problem Relation Age of Onset  . Hyperlipidemia Mother   . Heart disease Mother   . Hypertension Mother   . Alzheimer's disease Mother   . Prostate cancer Father   . Stroke Father      Current Outpatient Prescriptions:  .  dexamethasone (DECADRON) 4 MG tablet, Take 2 tablets by mouth once a day on the day after chemotherapy and then take 2 tablets two times a day for 2 days. Take with food., Disp: 30 tablet, Rfl: 1 .  dexlansoprazole (DEXILANT) 60 MG capsule, Take 1 capsule (60 mg total) by mouth daily., Disp: 30 capsule, Rfl: 1 .  lidocaine-prilocaine (EMLA) cream, Apply to affected area once, Disp: 30 g, Rfl: 3 .  lisinopril (PRINIVIL,ZESTRIL) 30 MG tablet, TAKE 1 TABLET (30 MG TOTAL) BY MOUTH DAILY., Disp: 90 tablet, Rfl: 3 .  LORazepam (ATIVAN) 0.5 MG tablet, Take 1 tablet (0.5 mg total) by mouth every 6 (six) hours as needed (Nausea or vomiting)., Disp: 30 tablet, Rfl: 0 .  Multiple Vitamins-Minerals (MULTIVITAMIN PO), Take by mouth daily., Disp: , Rfl:  .  omeprazole (PRILOSEC) 20 MG capsule, TAKE 1 CAPSULE (20 MG TOTAL) BY MOUTH 2 (TWO) TIMES DAILY., Disp: 180 capsule, Rfl: 3 .   ondansetron (ZOFRAN) 8 MG tablet, Take 1 tablet (8 mg total) by mouth 2 (two) times daily as needed. Start on the third day after chemotherapy., Disp: 30 tablet, Rfl: 1 .  prochlorperazine (COMPAZINE) 10 MG tablet, Take 1 tablet (10 mg total) by mouth every 6 (six) hours as needed (Nausea or vomiting)., Disp: 30 tablet, Rfl: 1 No current facility-administered medications for this visit.   Facility-Administered Medications Ordered in Other Visits:  .  heparin lock flush 100 unit/mL, 500 Units, Intravenous, Once, Sindy Guadeloupe, MD  Physical exam:  Vitals:   12/25/16 0914  BP: (!) 142/95  Pulse: 88  Resp: 20  Temp: 97.5 F (36.4 C)  TempSrc: Tympanic  Weight: 158 lb 8 oz (71.9 kg)   Physical Exam  Constitutional: He is oriented to person, place, and time and well-developed, well-nourished, and in no distress.  HENT:  Head: Normocephalic and atraumatic.  Eyes: EOM are normal. Pupils are equal, round, and reactive to light.  Neck: Normal range of motion.  Cardiovascular: Normal rate, regular rhythm and normal heart sounds.   Pulmonary/Chest: Effort normal and breath sounds normal.  Abdominal: Soft. Bowel sounds are normal.  Lymphadenopathy:  Right solitary cervical adenopathy  about 3-4 cm in size  Neurological: He is alert and oriented to person, place, and time.  Skin: Skin is warm and dry.     CMP Latest Ref Rng & Units 10/07/2016  Glucose 70 - 99 mg/dL -  BUN 6 - 23 mg/dL -  Creatinine 0.40 - 1.50 mg/dL 1.08  Sodium 135 - 145 mEq/L -  Potassium 3.5 - 5.1 mEq/L -  Chloride 96 - 112 mEq/L -  CO2 19 - 32 mEq/L -  Calcium 8.4 - 10.5 mg/dL -  Total Protein 6.0 - 8.3 g/dL -  Total Bilirubin 0.2 - 1.2 mg/dL -  Alkaline Phos 39 - 117 U/L -  AST 0 - 37 U/L -  ALT 0 - 53 U/L -   CBC Latest Ref Rng & Units 12/09/2016  WBC 3.8 - 10.6 K/uL 9.0  Hemoglobin 13.0 - 18.0 g/dL 13.5  Hematocrit 40.0 - 52.0 % 40.0  Platelets 150 - 440 K/uL 357    No images are attached to the  encounter.  Nm Pet Image Initial (pi) Skull Base To Thigh  Result Date: 12/07/2016 CLINICAL DATA:  Initial treatment strategy for squamous cell carcinoma of the oropharynx. Right-sided neck biopsy 2 weeks ago. EXAM: NUCLEAR MEDICINE PET SKULL BASE TO THIGH TECHNIQUE: 13.4 mCi F-18 FDG was injected intravenously. Full-ring PET imaging was performed from the skull base to thigh after the radiotracer. CT data was obtained and used for attenuation correction and anatomic localization. FASTING BLOOD GLUCOSE:  Value: 105 mg/dl COMPARISON:  Neck CT 10/30/2016. FINDINGS: NECK Hypermetabolism corresponding to the previously described right-sided level 2 node. This measures 2.5 cm and a S.U.V. max of 7.8 on image 39/series 3. Areas of photopenia within are likely related to necrosis. Minimal asymmetry involving the palatine tonsils as detailed on prior CT. The right palatine tonsil measures a S.U.V. max of 5.1 and the left palatine tonsil measures a S.U.V. max of 4.6. No other hypermetabolic cervical nodes. Bilateral carotid atherosclerosis. CHEST No pulmonary parenchymal or thoracic nodal hypermetabolism. Aortic and branch vessel atherosclerosis. Mild cardiomegaly. Multivessel coronary artery atherosclerosis. ABDOMEN/PELVIS No abdominopelvic nodal or parenchymal hypermetabolism. Abdominal aortic and branch vessel atherosclerosis. Normal adrenal glands. SKELETON No abnormal marrow activity. No focal osseous lesion. IMPRESSION: 1. Isolated nodal metastasis in the right level 2 station. 2. Subtle asymmetric enlargement and hypermetabolism of the right palatine tonsil . This likely corresponds to the primary lesion detailed on clinic notes. 3. No extracervical disease identified. 4. Age advanced coronary artery atherosclerosis. Recommend assessment of coronary risk factors and consideration of medical therapy. 5.  Aortic atherosclerosis. Electronically Signed   By: Abigail Miyamoto M.D.   On: 12/07/2016 09:42   Ir Fluoro Guide  Port Insertion Left  Result Date: 12/09/2016 CLINICAL DATA:  Tonsillar head and neck cancer EXAM: RIGHT INTERNAL SINGLE LUMEN POWER PORT CATHETER INSERTION Date:  4/25/20184/25/2018 3:30 pm Radiologist:  M. Daryll Brod, MD Guidance:  Ultrasound and fluoroscopic MEDICATIONS: Ancef 2 g; The antibiotic was administered within an appropriate time interval prior to skin puncture. ANESTHESIA/SEDATION: Versed 2.0 mg IV; Fentanyl 100 mcg IV; Moderate Sedation Time:  40 minutes The patient was continuously monitored during the procedure by the interventional radiology nurse under my direct supervision. FLUOROSCOPY TIME:  42 seconds (4.0 mGy) COMPLICATIONS: None immediate. CONTRAST:  None. PROCEDURE: Informed consent was obtained from the patient following explanation of the procedure, risks, benefits and alternatives. The patient understands, agrees and consents for the procedure. All questions were addressed. A time out was performed.  Maximal barrier sterile technique utilized including caps, mask, sterile gowns, sterile gloves, large sterile drape, hand hygiene, and 2% chlorhexidine scrub. Under sterile conditions and local anesthesia, right internal jugular micropuncture venous access was performed. Access was performed with ultrasound. Images were obtained for documentation. A guide wire was inserted followed by a transitional dilator. This allowed insertion of a guide wire and catheter into the IVC. Measurements were obtained from the SVC / RA junction back to the right IJ venotomy site. In the right infraclavicular chest, a subcutaneous pocket was created over the second anterior rib. This was done under sterile conditions and local anesthesia. 1% lidocaine with epinephrine was utilized for this. A 2.5 cm incision was made in the skin. Blunt dissection was performed to create a subcutaneous pocket over the right pectoralis major muscle. The pocket was flushed with saline vigorously. There was adequate hemostasis.  The port catheter was assembled and checked for leakage. The port catheter was secured in the pocket with two retention sutures. The tubing was tunneled subcutaneously to the right venotomy site and inserted into the SVC/RA junction through a valved peel-away sheath. Position was confirmed with fluoroscopy. Images were obtained for documentation. The patient tolerated the procedure well. No immediate complications. Incisions were closed in a two layer fashion with 4 - 0 Vicryl suture. Dermabond was applied to the skin. The port catheter was accessed, blood was aspirated followed by saline and heparin flushes. Needle was removed. A dry sterile dressing was applied. IMPRESSION: Ultrasound and fluoroscopically guided right internal jugular single lumen power port catheter insertion. Tip in the SVC/RA junction. Catheter ready for use. Electronically Signed   By: Jerilynn Mages.  Shick M.D.   On: 12/09/2016 16:26     Assessment and plan- Patient is a 56 y.o. 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 He has when necessary nausea medications. Radiation was started yesterday. PETct did not show distant mets. Possible tonsil primary but exact size uncertain.chemo/rt will be given with a curative intent  Heartburn- not improved with dexilant. Will add sucralfate.last endoscopy many years ago. Will refer to GI. If he needs endoscopy it would be better to get it early on during chemo/RT  Insomnia- encouraged him to find ways and means to relax. Would not encourage sleep aids. Prn benadryl has helped him in the past and he can use that occasionally  rtc in 1 week. Encourage PO intake   Visit Diagnosis 1. Neoplasm of oropharynx   2. Encounter for antineoplastic chemotherapy      Dr. Randa Evens, MD, MPH [facility] Pager- [phone] 12/25/2016  10:11 AM

## 2016-12-28 ENCOUNTER — Ambulatory Visit: Payer: BLUE CROSS/BLUE SHIELD

## 2016-12-28 ENCOUNTER — Ambulatory Visit
Admission: RE | Admit: 2016-12-28 | Discharge: 2016-12-28 | Disposition: A | Discharge status: Home / Self Care | Payer: BLUE CROSS/BLUE SHIELD | Source: Ambulatory Visit | Attending: Radiation Oncology | Admitting: Radiation Oncology

## 2016-12-28 ENCOUNTER — Telehealth: Payer: Self-pay | Admitting: *Deleted

## 2016-12-28 ENCOUNTER — Inpatient Hospital Stay: Payer: BLUE CROSS/BLUE SHIELD

## 2016-12-28 DIAGNOSIS — Z85828 Personal history of other malignant neoplasm of skin: Secondary | ICD-10-CM | POA: Diagnosis not present

## 2016-12-28 DIAGNOSIS — Z87891 Personal history of nicotine dependence: Secondary | ICD-10-CM | POA: Diagnosis not present

## 2016-12-28 DIAGNOSIS — Z8042 Family history of malignant neoplasm of prostate: Secondary | ICD-10-CM | POA: Diagnosis not present

## 2016-12-28 DIAGNOSIS — R42 Dizziness and giddiness: Secondary | ICD-10-CM | POA: Diagnosis not present

## 2016-12-28 DIAGNOSIS — Z79899 Other long term (current) drug therapy: Secondary | ICD-10-CM | POA: Diagnosis not present

## 2016-12-28 DIAGNOSIS — Z510 Encounter for antineoplastic radiation therapy: Secondary | ICD-10-CM | POA: Diagnosis not present

## 2016-12-28 DIAGNOSIS — C090 Malignant neoplasm of tonsillar fossa: Secondary | ICD-10-CM | POA: Diagnosis not present

## 2016-12-28 DIAGNOSIS — C770 Secondary and unspecified malignant neoplasm of lymph nodes of head, face and neck: Secondary | ICD-10-CM | POA: Diagnosis not present

## 2016-12-28 DIAGNOSIS — E7800 Pure hypercholesterolemia, unspecified: Secondary | ICD-10-CM | POA: Diagnosis not present

## 2016-12-28 DIAGNOSIS — I10 Essential (primary) hypertension: Secondary | ICD-10-CM | POA: Diagnosis not present

## 2016-12-28 NOTE — Progress Notes (Signed)
Nutrition Follow-up:  Nutrition follow-up completed following radiation therapy this pm.  Patient reports that this afternoon prior to coming to radiation felt like eyes dilated and memory got "fuzzy."  "I couldn't remember my wife's phone number to call her."  Patient wanting to know if this is chemo brain.  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  "I think I might have just got hot." Hard for patient to remember what he ate this weekend.  Ate waffles with strawberries and sausage for breakfast yesterday am but couldn't remember intake for lunch and dinner.  Reports his appetite has been good. "I have been eating meats and vegetables.  No nausea and feels that heartburn is better.  Does not think he started carafate.  Concerned about his memory.  Reports that he has been drinking fluids today ate leftover chicken and rice for lunch.    No nausea per patient.  Feels that heartburn is getting a little bit better.    Medications: reviewed  Labs: reviewed  Anthropometrics:   No new weight taken today   NUTRITION DIAGNOSIS: Food and nutrition knowledge deficit improved   MALNUTRITION DIAGNOSIS: none at this time   INTERVENTION:   Discussed patient's memory concerns with RN, Sherri and RN was able to speak with patient in clinic. "I think I just got to hot."  "I have got to do better." Encouraged patient to continue to focus on consuming high calorie, high protein foods.   MONITORING, EVALUATION, GOAL: Patient will consume adequate calories and protein to prevent weight loss.    NEXT VISIT: May 21 after radiation  Malee Grays B. Zenia Resides, [place], [place] Registered Dietitian [phone] (pager)

## 2016-12-28 NOTE — Telephone Encounter (Signed)
Joli was talking about pt for dietary concerns this week.  Patient states that his eyes got dilated and he could not remember his wife's number.  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  He got hot out there and was coming in to get ready to leave to come to cancer center when he started feeling this way.  I told him that his sx felt like he was overheated.  He states that he feels the same way. He was feeling back to himself while sitting in the cool inside. I told him he needs to take breaks from garage and come inside and cool down and drink fluids.  He states that he has been drinking water but I told him he also needs to come inside and rest in cool air and not stay outside in 90 degree weather.  I asked him to call if he has this happen to him again. He will be mindful about how long he stays out in garage in the heat and come in more to cool off.

## 2016-12-29 ENCOUNTER — Ambulatory Visit
Admission: RE | Admit: 2016-12-29 | Discharge: 2016-12-29 | Disposition: A | Discharge status: Home / Self Care | Payer: BLUE CROSS/BLUE SHIELD | Source: Ambulatory Visit | Attending: Radiation Oncology | Admitting: Radiation Oncology

## 2016-12-29 ENCOUNTER — Ambulatory Visit: Payer: BLUE CROSS/BLUE SHIELD

## 2016-12-29 DIAGNOSIS — R42 Dizziness and giddiness: Secondary | ICD-10-CM | POA: Diagnosis not present

## 2016-12-29 DIAGNOSIS — C770 Secondary and unspecified malignant neoplasm of lymph nodes of head, face and neck: Secondary | ICD-10-CM | POA: Diagnosis not present

## 2016-12-29 DIAGNOSIS — Z8042 Family history of malignant neoplasm of prostate: Secondary | ICD-10-CM | POA: Diagnosis not present

## 2016-12-29 DIAGNOSIS — E7800 Pure hypercholesterolemia, unspecified: Secondary | ICD-10-CM | POA: Diagnosis not present

## 2016-12-29 DIAGNOSIS — C090 Malignant neoplasm of tonsillar fossa: Secondary | ICD-10-CM | POA: Diagnosis not present

## 2016-12-29 DIAGNOSIS — Z79899 Other long term (current) drug therapy: Secondary | ICD-10-CM | POA: Diagnosis not present

## 2016-12-29 DIAGNOSIS — I10 Essential (primary) hypertension: Secondary | ICD-10-CM | POA: Diagnosis not present

## 2016-12-29 DIAGNOSIS — Z85828 Personal history of other malignant neoplasm of skin: Secondary | ICD-10-CM | POA: Diagnosis not present

## 2016-12-29 DIAGNOSIS — Z510 Encounter for antineoplastic radiation therapy: Secondary | ICD-10-CM | POA: Diagnosis not present

## 2016-12-29 DIAGNOSIS — Z87891 Personal history of nicotine dependence: Secondary | ICD-10-CM | POA: Diagnosis not present

## 2016-12-30 ENCOUNTER — Other Ambulatory Visit: Payer: Self-pay | Admitting: *Deleted

## 2016-12-30 ENCOUNTER — Inpatient Hospital Stay: Payer: BLUE CROSS/BLUE SHIELD

## 2016-12-30 ENCOUNTER — Ambulatory Visit
Admission: RE | Admit: 2016-12-30 | Discharge: 2016-12-30 | Disposition: A | Discharge status: Home / Self Care | Payer: BLUE CROSS/BLUE SHIELD | Source: Ambulatory Visit | Attending: Internal Medicine | Admitting: Internal Medicine

## 2016-12-30 ENCOUNTER — Telehealth: Payer: Self-pay | Admitting: *Deleted

## 2016-12-30 ENCOUNTER — Ambulatory Visit
Admission: RE | Admit: 2016-12-30 | Discharge: 2016-12-30 | Disposition: A | Discharge status: Home / Self Care | Payer: BLUE CROSS/BLUE SHIELD | Source: Ambulatory Visit | Attending: Radiation Oncology | Admitting: Radiation Oncology

## 2016-12-30 ENCOUNTER — Ambulatory Visit: Payer: BLUE CROSS/BLUE SHIELD

## 2016-12-30 DIAGNOSIS — R600 Localized edema: Secondary | ICD-10-CM | POA: Diagnosis not present

## 2016-12-30 DIAGNOSIS — E7800 Pure hypercholesterolemia, unspecified: Secondary | ICD-10-CM | POA: Diagnosis not present

## 2016-12-30 DIAGNOSIS — R42 Dizziness and giddiness: Secondary | ICD-10-CM | POA: Diagnosis not present

## 2016-12-30 DIAGNOSIS — Z87891 Personal history of nicotine dependence: Secondary | ICD-10-CM | POA: Diagnosis not present

## 2016-12-30 DIAGNOSIS — R12 Heartburn: Secondary | ICD-10-CM | POA: Diagnosis not present

## 2016-12-30 DIAGNOSIS — I517 Cardiomegaly: Secondary | ICD-10-CM | POA: Diagnosis not present

## 2016-12-30 DIAGNOSIS — I10 Essential (primary) hypertension: Secondary | ICD-10-CM | POA: Diagnosis not present

## 2016-12-30 DIAGNOSIS — Z510 Encounter for antineoplastic radiation therapy: Secondary | ICD-10-CM | POA: Diagnosis not present

## 2016-12-30 DIAGNOSIS — Z85828 Personal history of other malignant neoplasm of skin: Secondary | ICD-10-CM | POA: Diagnosis not present

## 2016-12-30 DIAGNOSIS — Z5111 Encounter for antineoplastic chemotherapy: Secondary | ICD-10-CM

## 2016-12-30 DIAGNOSIS — R252 Cramp and spasm: Secondary | ICD-10-CM

## 2016-12-30 DIAGNOSIS — K219 Gastro-esophageal reflux disease without esophagitis: Secondary | ICD-10-CM | POA: Diagnosis not present

## 2016-12-30 DIAGNOSIS — Z79899 Other long term (current) drug therapy: Secondary | ICD-10-CM | POA: Diagnosis not present

## 2016-12-30 DIAGNOSIS — C090 Malignant neoplasm of tonsillar fossa: Secondary | ICD-10-CM | POA: Diagnosis not present

## 2016-12-30 DIAGNOSIS — C109 Malignant neoplasm of oropharynx, unspecified: Secondary | ICD-10-CM | POA: Diagnosis not present

## 2016-12-30 DIAGNOSIS — I2510 Atherosclerotic heart disease of native coronary artery without angina pectoris: Secondary | ICD-10-CM | POA: Diagnosis not present

## 2016-12-30 DIAGNOSIS — I6523 Occlusion and stenosis of bilateral carotid arteries: Secondary | ICD-10-CM | POA: Diagnosis not present

## 2016-12-30 DIAGNOSIS — Z8042 Family history of malignant neoplasm of prostate: Secondary | ICD-10-CM | POA: Diagnosis not present

## 2016-12-30 DIAGNOSIS — C770 Secondary and unspecified malignant neoplasm of lymph nodes of head, face and neck: Secondary | ICD-10-CM | POA: Diagnosis not present

## 2016-12-30 DIAGNOSIS — I700 Atherosclerosis of aorta: Secondary | ICD-10-CM | POA: Diagnosis not present

## 2016-12-30 DIAGNOSIS — G4700 Insomnia, unspecified: Secondary | ICD-10-CM | POA: Diagnosis not present

## 2016-12-30 DIAGNOSIS — D490 Neoplasm of unspecified behavior of digestive system: Secondary | ICD-10-CM

## 2016-12-30 LAB — CBC WITH DIFFERENTIAL/PLATELET
BASOS PCT: 0 %
Basophils Absolute: 0 10*3/uL (ref 0–0.1)
EOS ABS: 0 10*3/uL (ref 0–0.7)
EOS PCT: 0 %
HCT: 39.9 % — ABNORMAL LOW (ref 40.0–52.0)
Hemoglobin: 13.6 g/dL (ref 13.0–18.0)
LYMPHS PCT: 20 %
Lymphs Abs: 2.4 10*3/uL (ref 1.0–3.6)
MCH: 28.3 pg (ref 26.0–34.0)
MCHC: 34.2 g/dL (ref 32.0–36.0)
MCV: 82.7 fL (ref 80.0–100.0)
MONO ABS: 1.4 10*3/uL — AB (ref 0.2–1.0)
MONOS PCT: 12 %
Neutro Abs: 8 10*3/uL — ABNORMAL HIGH (ref 1.4–6.5)
Neutrophils Relative %: 68 %
Platelets: 341 10*3/uL (ref 150–440)
RBC: 4.82 MIL/uL (ref 4.40–5.90)
RDW: 13.5 % (ref 11.5–14.5)
WBC: 11.8 10*3/uL — ABNORMAL HIGH (ref 3.8–10.6)

## 2016-12-30 LAB — COMPREHENSIVE METABOLIC PANEL
ALBUMIN: 4 g/dL (ref 3.5–5.0)
ALT: 43 U/L (ref 17–63)
ANION GAP: 8 (ref 5–15)
AST: 23 U/L (ref 15–41)
Alkaline Phosphatase: 61 U/L (ref 38–126)
BUN: 30 mg/dL — ABNORMAL HIGH (ref 6–20)
CALCIUM: 9.6 mg/dL (ref 8.9–10.3)
CO2: 26 mmol/L (ref 22–32)
Chloride: 98 mmol/L — ABNORMAL LOW (ref 101–111)
Creatinine, Ser: 1.17 mg/dL (ref 0.61–1.24)
GFR calc non Af Amer: >60 mL/min (ref >60)
GLUCOSE: 99 mg/dL (ref 65–99)
POTASSIUM: 3.8 mmol/L (ref 3.5–5.1)
SODIUM: 132 mmol/L — AB (ref 135–145)
TOTAL PROTEIN: 7.5 g/dL (ref 6.5–8.1)
Total Bilirubin: 0.6 mg/dL (ref 0.3–1.2)

## 2016-12-30 LAB — MAGNESIUM: Magnesium: 1.8 mg/dL (ref 1.7–2.4)

## 2016-12-30 NOTE — Telephone Encounter (Signed)
Called to ask what can be done regarding cramping in his calves which started this morning and has not let up for 3 hours. He has tried exercising, and eating bananas and it has become difficult to walk now. It is affecting the left calf more than the right. He is a Dr Janese Banks patient with squamous cell carcinoma of the oropharynx. Please advise

## 2016-12-30 NOTE — Telephone Encounter (Signed)
Per Dr Rogue Bussing, draw K+ and Mg+ levels if normal, will send for doppler. PC to patient and he has agreed to come now for lab and will await results. He asked about his XRT at 69 and I told him to come on now

## 2016-12-31 ENCOUNTER — Ambulatory Visit: Payer: BLUE CROSS/BLUE SHIELD

## 2016-12-31 ENCOUNTER — Ambulatory Visit
Admission: RE | Admit: 2016-12-31 | Discharge: 2016-12-31 | Disposition: A | Discharge status: Home / Self Care | Payer: BLUE CROSS/BLUE SHIELD | Source: Ambulatory Visit | Attending: Radiation Oncology | Admitting: Radiation Oncology

## 2016-12-31 ENCOUNTER — Ambulatory Visit: Admission: RE | Admit: 2016-12-31 | Payer: BLUE CROSS/BLUE SHIELD | Source: Ambulatory Visit

## 2016-12-31 ENCOUNTER — Ambulatory Visit: Payer: BLUE CROSS/BLUE SHIELD | Admitting: Internal Medicine

## 2016-12-31 DIAGNOSIS — E7800 Pure hypercholesterolemia, unspecified: Secondary | ICD-10-CM | POA: Diagnosis not present

## 2016-12-31 DIAGNOSIS — Z87891 Personal history of nicotine dependence: Secondary | ICD-10-CM | POA: Diagnosis not present

## 2016-12-31 DIAGNOSIS — C770 Secondary and unspecified malignant neoplasm of lymph nodes of head, face and neck: Secondary | ICD-10-CM | POA: Diagnosis not present

## 2016-12-31 DIAGNOSIS — Z79899 Other long term (current) drug therapy: Secondary | ICD-10-CM | POA: Diagnosis not present

## 2016-12-31 DIAGNOSIS — I10 Essential (primary) hypertension: Secondary | ICD-10-CM | POA: Diagnosis not present

## 2016-12-31 DIAGNOSIS — Z85828 Personal history of other malignant neoplasm of skin: Secondary | ICD-10-CM | POA: Diagnosis not present

## 2016-12-31 DIAGNOSIS — C090 Malignant neoplasm of tonsillar fossa: Secondary | ICD-10-CM | POA: Diagnosis not present

## 2016-12-31 DIAGNOSIS — Z8042 Family history of malignant neoplasm of prostate: Secondary | ICD-10-CM | POA: Diagnosis not present

## 2016-12-31 DIAGNOSIS — R42 Dizziness and giddiness: Secondary | ICD-10-CM | POA: Diagnosis not present

## 2016-12-31 DIAGNOSIS — Z510 Encounter for antineoplastic radiation therapy: Secondary | ICD-10-CM | POA: Diagnosis not present

## 2017-01-01 ENCOUNTER — Inpatient Hospital Stay (HOSPITAL_BASED_OUTPATIENT_CLINIC_OR_DEPARTMENT_OTHER): Payer: BLUE CROSS/BLUE SHIELD | Admitting: Oncology

## 2017-01-01 ENCOUNTER — Encounter: Payer: Self-pay | Admitting: Oncology

## 2017-01-01 ENCOUNTER — Other Ambulatory Visit: Payer: Self-pay | Admitting: *Deleted

## 2017-01-01 ENCOUNTER — Inpatient Hospital Stay: Payer: BLUE CROSS/BLUE SHIELD

## 2017-01-01 ENCOUNTER — Ambulatory Visit: Payer: BLUE CROSS/BLUE SHIELD

## 2017-01-01 ENCOUNTER — Ambulatory Visit
Admission: RE | Admit: 2017-01-01 | Discharge: 2017-01-01 | Disposition: A | Discharge status: Home / Self Care | Payer: BLUE CROSS/BLUE SHIELD | Source: Ambulatory Visit | Attending: Radiation Oncology | Admitting: Radiation Oncology

## 2017-01-01 VITALS — BP 125/83 | HR 96 | Temp 98.0°F | Resp 18 | Wt 157.2 lb

## 2017-01-01 DIAGNOSIS — K219 Gastro-esophageal reflux disease without esophagitis: Secondary | ICD-10-CM

## 2017-01-01 DIAGNOSIS — R12 Heartburn: Secondary | ICD-10-CM

## 2017-01-01 DIAGNOSIS — I10 Essential (primary) hypertension: Secondary | ICD-10-CM | POA: Diagnosis not present

## 2017-01-01 DIAGNOSIS — Z8042 Family history of malignant neoplasm of prostate: Secondary | ICD-10-CM

## 2017-01-01 DIAGNOSIS — I2510 Atherosclerotic heart disease of native coronary artery without angina pectoris: Secondary | ICD-10-CM | POA: Diagnosis not present

## 2017-01-01 DIAGNOSIS — Z5111 Encounter for antineoplastic chemotherapy: Secondary | ICD-10-CM | POA: Diagnosis not present

## 2017-01-01 DIAGNOSIS — D490 Neoplasm of unspecified behavior of digestive system: Secondary | ICD-10-CM

## 2017-01-01 DIAGNOSIS — R252 Cramp and spasm: Secondary | ICD-10-CM | POA: Diagnosis not present

## 2017-01-01 DIAGNOSIS — Z79899 Other long term (current) drug therapy: Secondary | ICD-10-CM

## 2017-01-01 DIAGNOSIS — R42 Dizziness and giddiness: Secondary | ICD-10-CM | POA: Diagnosis not present

## 2017-01-01 DIAGNOSIS — I6523 Occlusion and stenosis of bilateral carotid arteries: Secondary | ICD-10-CM

## 2017-01-01 DIAGNOSIS — Z87891 Personal history of nicotine dependence: Secondary | ICD-10-CM | POA: Diagnosis not present

## 2017-01-01 DIAGNOSIS — C779 Secondary and unspecified malignant neoplasm of lymph node, unspecified: Secondary | ICD-10-CM | POA: Diagnosis not present

## 2017-01-01 DIAGNOSIS — E7800 Pure hypercholesterolemia, unspecified: Secondary | ICD-10-CM

## 2017-01-01 DIAGNOSIS — C770 Secondary and unspecified malignant neoplasm of lymph nodes of head, face and neck: Secondary | ICD-10-CM | POA: Diagnosis not present

## 2017-01-01 DIAGNOSIS — Z85828 Personal history of other malignant neoplasm of skin: Secondary | ICD-10-CM

## 2017-01-01 DIAGNOSIS — C109 Malignant neoplasm of oropharynx, unspecified: Secondary | ICD-10-CM | POA: Diagnosis not present

## 2017-01-01 DIAGNOSIS — I517 Cardiomegaly: Secondary | ICD-10-CM

## 2017-01-01 DIAGNOSIS — G4700 Insomnia, unspecified: Secondary | ICD-10-CM

## 2017-01-01 DIAGNOSIS — C801 Malignant (primary) neoplasm, unspecified: Secondary | ICD-10-CM

## 2017-01-01 DIAGNOSIS — Z510 Encounter for antineoplastic radiation therapy: Secondary | ICD-10-CM | POA: Diagnosis not present

## 2017-01-01 DIAGNOSIS — C090 Malignant neoplasm of tonsillar fossa: Secondary | ICD-10-CM | POA: Diagnosis not present

## 2017-01-01 DIAGNOSIS — I700 Atherosclerosis of aorta: Secondary | ICD-10-CM | POA: Diagnosis not present

## 2017-01-01 LAB — CBC WITH DIFFERENTIAL/PLATELET
BASOS ABS: 0 10*3/uL (ref 0–0.1)
Basophils Relative: 0 %
Eosinophils Absolute: 0 10*3/uL (ref 0–0.7)
Eosinophils Relative: 0 %
HCT: 36.1 % — ABNORMAL LOW (ref 40.0–52.0)
HEMOGLOBIN: 12.4 g/dL — AB (ref 13.0–18.0)
LYMPHS ABS: 1.5 10*3/uL (ref 1.0–3.6)
LYMPHS PCT: 13 %
MCH: 28.6 pg (ref 26.0–34.0)
MCHC: 34.5 g/dL (ref 32.0–36.0)
MCV: 82.9 fL (ref 80.0–100.0)
Monocytes Absolute: 1.4 10*3/uL — ABNORMAL HIGH (ref 0.2–1.0)
Monocytes Relative: 13 %
NEUTROS ABS: 8.3 10*3/uL — AB (ref 1.4–6.5)
Neutrophils Relative %: 74 %
PLATELETS: 277 10*3/uL (ref 150–440)
RBC: 4.35 MIL/uL — AB (ref 4.40–5.90)
RDW: 13.6 % (ref 11.5–14.5)
WBC: 11.3 10*3/uL — AB (ref 3.8–10.6)

## 2017-01-01 LAB — COMPREHENSIVE METABOLIC PANEL
ALBUMIN: 3.5 g/dL (ref 3.5–5.0)
ALT: 31 U/L (ref 17–63)
ANION GAP: 4 — AB (ref 5–15)
AST: 23 U/L (ref 15–41)
Alkaline Phosphatase: 52 U/L (ref 38–126)
BILIRUBIN TOTAL: 0.7 mg/dL (ref 0.3–1.2)
BUN: 31 mg/dL — ABNORMAL HIGH (ref 6–20)
CHLORIDE: 101 mmol/L (ref 101–111)
CO2: 26 mmol/L (ref 22–32)
Calcium: 9.2 mg/dL (ref 8.9–10.3)
Creatinine, Ser: 1.16 mg/dL (ref 0.61–1.24)
GFR calc Af Amer: >60 mL/min (ref >60)
GFR calc non Af Amer: >60 mL/min (ref >60)
Glucose, Bld: 118 mg/dL — ABNORMAL HIGH (ref 65–99)
POTASSIUM: 4.6 mmol/L (ref 3.5–5.1)
Sodium: 131 mmol/L — ABNORMAL LOW (ref 135–145)
Total Protein: 7 g/dL (ref 6.5–8.1)

## 2017-01-01 MED ORDER — SODIUM CHLORIDE 0.9 % IV SOLN
Freq: Once | INTRAVENOUS | Status: AC
  Administered 2017-01-01: 12:00:00 via INTRAVENOUS
  Filled 2017-01-01: qty 5

## 2017-01-01 MED ORDER — SODIUM CHLORIDE 0.9 % IV SOLN
Freq: Once | INTRAVENOUS | Status: AC
  Administered 2017-01-01: 10:00:00 via INTRAVENOUS
  Filled 2017-01-01: qty 1000

## 2017-01-01 MED ORDER — DEXTROSE-NACL 5-0.45 % IV SOLN
Freq: Once | INTRAVENOUS | Status: AC
  Administered 2017-01-01: 10:00:00 via INTRAVENOUS
  Filled 2017-01-01: qty 1000

## 2017-01-01 MED ORDER — SODIUM CHLORIDE 0.9% FLUSH
10.0000 mL | INTRAVENOUS | Status: DC | PRN
  Administered 2017-01-01: 10 mL via INTRAVENOUS
  Filled 2017-01-01: qty 10

## 2017-01-01 MED ORDER — PALONOSETRON HCL INJECTION 0.25 MG/5ML
0.2500 mg | Freq: Once | INTRAVENOUS | Status: AC
  Administered 2017-01-01: 0.25 mg via INTRAVENOUS
  Filled 2017-01-01: qty 5

## 2017-01-01 MED ORDER — SODIUM CHLORIDE 0.9 % IV SOLN
40.0000 mg/m2 | Freq: Once | INTRAVENOUS | Status: AC
  Administered 2017-01-01: 75 mg via INTRAVENOUS
  Filled 2017-01-01: qty 75

## 2017-01-01 MED ORDER — HEPARIN SOD (PORK) LOCK FLUSH 100 UNIT/ML IV SOLN
500.0000 [IU] | Freq: Once | INTRAVENOUS | Status: AC
  Administered 2017-01-01: 500 [IU] via INTRAVENOUS
  Filled 2017-01-01: qty 5

## 2017-01-01 MED ORDER — MAGIC MOUTHWASH: 5.0000 mL | Freq: Four times a day (QID) | ORAL | 1 refills | Status: DC | PRN

## 2017-01-01 NOTE — Progress Notes (Signed)
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 Con Memos in March 2018 or neck mass which has been persistent for 1 month. The mass developed suddenly and was fairly asymptomatic. Patient underwent a comprehensive ENT exam which showed asymmetric and enlarged right tonsil. 2 x 4 cm level II mass was noted on exam. CT soft tissue neck on 10/30/2016 showed necrotic level II lymph node measuring 4.7 cm x 2.7 cm. Very slight tonsillar asymmetry on the right raises the possibility of an occult right tonsillar carcinoma. Patient underwent core biopsy of the cervical lymph node which showed metastatic squamous cell carcinoma P16 positive. Patient has been referred to [place] for further management.  PET/CT on 12/07/16 showed: 1. Isolated nodal metastasis in the right level 2 station. 2. Subtle asymmetric enlargement and hypermetabolism of the right palatine tonsil . This likely corresponds to the primary lesion detailed on clinic notes. 3. No extracervical disease identified. 4. Age advanced coronary artery atherosclerosis. Recommend assessment of coronary risk factors and consideration of medical therapy. 5. Aortic atherosclerosis.  Baseline audiogram on 12/14/2016 showed mild sensorineural hearing loss in his right ear and mild-to-moderate sensorineural hearing loss in his left ear. Decreased hearing as compared to 2010  Interval history- reports  some soreness in his throat. Leg cramps are improving. Heartburn improving. He has not used sucralfate yet  ECOG PS- 1 Pain scale- 4 Opioid associated constipation- no  Review of systems- Review of Systems  Constitutional: Negative for chills, fever, malaise/fatigue and weight loss.  HENT: Negative for congestion, ear discharge and nosebleeds.   Eyes: Negative for blurred vision.  Respiratory: Negative for cough, hemoptysis, sputum production, shortness of breath and wheezing.   Cardiovascular: Negative for chest pain, palpitations, orthopnea and claudication.  Gastrointestinal: Negative for abdominal pain, blood in stool, constipation, diarrhea, heartburn, melena, nausea and vomiting.  Genitourinary: Negative for dysuria, flank pain, frequency, hematuria and urgency.  Musculoskeletal: Negative for back pain, joint pain and myalgias.  Skin: Negative for rash.  Neurological: Negative for dizziness, tingling, focal weakness, seizures, weakness and headaches.  Endo/Heme/Allergies: Does not bruise/bleed easily.  Psychiatric/Behavioral: Negative for depression and suicidal ideas. The patient does not have insomnia.       No Known Allergies   Past Medical History:  Diagnosis Date  . Basal cell carcinoma (BCC) of back   . Basal cell carcinoma (BCC) of nasal sidewall   . Dizziness   . GERD (gastroesophageal reflux disease)   . Hypercholesterolemia   . Hypertension    hx of HBP readings  . Metastatic squamous cell carcinoma ([place]) 2018   bx cervical lymph node     Past Surgical History:  Procedure Laterality Date  . COLONOSCOPY    . ESOPHAGOGASTRODUODENOSCOPY (EGD) WITH PROPOFOL N/A 05/24/2015   Procedure: ESOPHAGOGASTRODUODENOSCOPY (EGD) WITH PROPOFOL;  Surgeon: Manya Silvas, MD;  Location: [facility] ENDOSCOPY;  Service: Endoscopy;  Laterality: N/A;  . IR FLUORO GUIDE PORT INSERTION LEFT  12/09/2016  . SAVORY DILATION N/A 05/24/2015  Procedure: SAVORY DILATION;  Surgeon: Manya Silvas, MD;  Location: [facility] ENDOSCOPY;  Service: Endoscopy;  Laterality: N/A;    Social History   Social History  . Marital status: Married    Spouse name: N/A  . Number of children: N/A  . Years of education: N/A   Occupational History  . Not on file.   Social History Main Topics  . Smoking status: Former Smoker    Quit date: 11/11/1996  . Smokeless tobacco: Never Used  . Alcohol use 0.0 oz/week     Comment: occasionally  . Drug use: No  . Sexual activity: Yes   Other Topics Concern  . Not on file   Social History Narrative  . No narrative on file    Family History  Problem Relation Age of Onset  . Hyperlipidemia Mother   . Heart disease Mother   . Hypertension Mother   . Alzheimer's disease Mother   . Prostate cancer Father   . Stroke Father      Current Outpatient Prescriptions:  .  dexamethasone (DECADRON) 4 MG tablet, Take 2 tablets by mouth once a day on the day after chemotherapy and then take 2 tablets two times a day for 2 days. Take with food., Disp: 30 tablet, Rfl: 1 .  dexlansoprazole (DEXILANT) 60 MG capsule, Take 1 capsule (60 mg total) by mouth daily., Disp: 30 capsule, Rfl: 1 .  lidocaine-prilocaine (EMLA) cream, Apply to affected area once, Disp: 30 g, Rfl: 3 .  lisinopril (PRINIVIL,ZESTRIL) 30 MG tablet, TAKE 1 TABLET (30 MG TOTAL) BY MOUTH DAILY., Disp: 90 tablet, Rfl: 3 .  LORazepam (ATIVAN) 0.5 MG tablet, Take 1 tablet (0.5 mg total) by mouth every 6 (six) hours as needed (Nausea or vomiting). (Patient not taking: Reported on 12/25/2016), Disp: 30 tablet, Rfl: 0 .  Multiple Vitamins-Minerals (MULTIVITAMIN PO), Take by mouth daily., Disp: , Rfl:  .  omeprazole (PRILOSEC) 20 MG capsule, TAKE 1 CAPSULE (20 MG TOTAL) BY MOUTH 2 (TWO) TIMES DAILY. (Patient not taking: Reported on 12/25/2016), Disp: 180 capsule, Rfl: 3 .  ondansetron (ZOFRAN) 8 MG tablet, Take 1 tablet (8 mg total) by mouth 2 (two) times daily as needed. Start on the third day after  chemotherapy., Disp: 30 tablet, Rfl: 1 .  prochlorperazine (COMPAZINE) 10 MG tablet, Take 1 tablet (10 mg total) by mouth every 6 (six) hours as needed (Nausea or vomiting)., Disp: 30 tablet, Rfl: 1 .  sucralfate (CARAFATE) 1 g tablet, Take 1 tablet (1 g total) by mouth 4 (four) times daily -  with meals and at bedtime., Disp: 120 tablet, Rfl: 1 No current facility-administered medications for this visit.   Facility-Administered Medications Ordered in Other Visits:  .  heparin lock flush 100 unit/mL, 500 Units, Intravenous, Once, Sindy Guadeloupe, MD .  sodium chloride flush (NS) 0.9 % injection 10 mL, 10 mL, Intravenous, PRN, Sindy Guadeloupe, MD, 10 mL at 01/01/17 0821  Physical exam:  Vitals:   01/01/17 0841 01/01/17 0842  BP: 125/83 125/83  Pulse: 96 96  Resp: 18 18  Temp: (!) 96 F (35.6 C) 98 F (36.7 C)  TempSrc: Tympanic Tympanic  Weight: 157 lb 3.2 oz (71.3 kg) 157 lb 3.2 oz (71.3 kg)   Physical Exam  Constitutional: He is oriented to person, place, and time and well-developed, well-nourished, and in no distress.  HENT:  Head: Normocephalic and atraumatic.  No evidence of mucositis  Eyes: EOM are normal. Pupils are equal, round, and  reactive to light.  Neck: Normal range of motion.  Cardiovascular: Normal rate, regular rhythm and normal heart sounds.   Pulmonary/Chest: Effort normal and breath sounds normal.  Abdominal: Soft. Bowel sounds are normal.  Neurological: He is alert and oriented to person, place, and time.  Skin: Skin is warm and dry.     CMP Latest Ref Rng & Units 12/30/2016  Glucose 65 - 99 mg/dL 99  BUN 6 - 20 mg/dL 30(H)  Creatinine 0.61 - 1.24 mg/dL 1.17  Sodium 135 - 145 mmol/L 132(L)  Potassium 3.5 - 5.1 mmol/L 3.8  Chloride 101 - 111 mmol/L 98(L)  CO2 22 - 32 mmol/L 26  Calcium 8.9 - 10.3 mg/dL 9.6  Total Protein 6.5 - 8.1 g/dL 7.5  Total Bilirubin 0.3 - 1.2 mg/dL 0.6  Alkaline Phos 38 - 126 U/L 61  AST 15 - 41 U/L 23  ALT 17 - 63 U/L 43   CBC  Latest Ref Rng & Units 12/30/2016  WBC 3.8 - 10.6 K/uL 11.8(H)  Hemoglobin 13.0 - 18.0 g/dL 13.6  Hematocrit 40.0 - 52.0 % 39.9(L)  Platelets 150 - 440 K/uL 341    No images are attached to the encounter.  Nm Pet Image Initial (pi) Skull Base To Thigh  Result Date: 12/07/2016 CLINICAL DATA:  Initial treatment strategy for squamous cell carcinoma of the oropharynx. Right-sided neck biopsy 2 weeks ago. EXAM: NUCLEAR MEDICINE PET SKULL BASE TO THIGH TECHNIQUE: 13.4 mCi F-18 FDG was injected intravenously. Full-ring PET imaging was performed from the skull base to thigh after the radiotracer. CT data was obtained and used for attenuation correction and anatomic localization. FASTING BLOOD GLUCOSE:  Value: 105 mg/dl COMPARISON:  Neck CT 10/30/2016. FINDINGS: NECK Hypermetabolism corresponding to the previously described right-sided level 2 node. This measures 2.5 cm and a S.U.V. max of 7.8 on image 39/series 3. Areas of photopenia within are likely related to necrosis. Minimal asymmetry involving the palatine tonsils as detailed on prior CT. The right palatine tonsil measures a S.U.V. max of 5.1 and the left palatine tonsil measures a S.U.V. max of 4.6. No other hypermetabolic cervical nodes. Bilateral carotid atherosclerosis. CHEST No pulmonary parenchymal or thoracic nodal hypermetabolism. Aortic and branch vessel atherosclerosis. Mild cardiomegaly. Multivessel coronary artery atherosclerosis. ABDOMEN/PELVIS No abdominopelvic nodal or parenchymal hypermetabolism. Abdominal aortic and branch vessel atherosclerosis. Normal adrenal glands. SKELETON No abnormal marrow activity. No focal osseous lesion. IMPRESSION: 1. Isolated nodal metastasis in the right level 2 station. 2. Subtle asymmetric enlargement and hypermetabolism of the right palatine tonsil . This likely corresponds to the primary lesion detailed on clinic notes. 3. No extracervical disease identified. 4. Age advanced coronary artery atherosclerosis.  Recommend assessment of coronary risk factors and consideration of medical therapy. 5.  Aortic atherosclerosis. Electronically Signed   By: Abigail Miyamoto M.D.   On: 12/07/2016 09:42   US Venous Img Lower Bilateral  Result Date: 12/30/2016 CLINICAL DATA:  Bilateral lower extremity pain and edema. History of malignancy. Evaluate for DVT. EXAM: BILATERAL LOWER EXTREMITY VENOUS DOPPLER ULTRASOUND TECHNIQUE: Gray-scale sonography with graded compression, as well as color Doppler and duplex ultrasound were performed to evaluate the lower extremity deep venous systems from the level of the common femoral vein and including the common femoral, femoral, profunda femoral, popliteal and calf veins including the posterior tibial, peroneal and gastrocnemius veins when visible. The superficial great saphenous vein was also interrogated. Spectral Doppler was utilized to evaluate flow at rest and with distal augmentation maneuvers in the common  femoral, femoral and popliteal veins. COMPARISON:  None. FINDINGS: RIGHT LOWER EXTREMITY Common Femoral Vein: No evidence of thrombus. Normal compressibility, respiratory phasicity and response to augmentation. Saphenofemoral Junction: No evidence of thrombus. Normal compressibility and flow on color Doppler imaging. Profunda Femoral Vein: No evidence of thrombus. Normal compressibility and flow on color Doppler imaging. Femoral Vein: No evidence of thrombus. Normal compressibility, respiratory phasicity and response to augmentation. Popliteal Vein: No evidence of thrombus. Normal compressibility, respiratory phasicity and response to augmentation. Calf Veins: No evidence of thrombus. Normal compressibility and flow on color Doppler imaging. Superficial Great Saphenous Vein: No evidence of thrombus. Normal compressibility and flow on color Doppler imaging. Venous Reflux:  None. Other Findings:  None. LEFT LOWER EXTREMITY Common Femoral Vein: No evidence of thrombus. Normal  compressibility, respiratory phasicity and response to augmentation. Saphenofemoral Junction: No evidence of thrombus. Normal compressibility and flow on color Doppler imaging. Profunda Femoral Vein: No evidence of thrombus. Normal compressibility and flow on color Doppler imaging. Femoral Vein: No evidence of thrombus. Normal compressibility, respiratory phasicity and response to augmentation. Popliteal Vein: No evidence of thrombus. Normal compressibility, respiratory phasicity and response to augmentation. Calf Veins: No evidence of thrombus. Normal compressibility and flow on color Doppler imaging. Superficial Great Saphenous Vein: No evidence of thrombus. Normal compressibility and flow on color Doppler imaging. Venous Reflux:  None. Other Findings:  None. IMPRESSION: No evidence of DVT within either lower extremity. Electronically Signed   By: Sandi Mariscal M.D.   On: 12/30/2016 14:38   Ir Fluoro Guide Port Insertion Left  Result Date: 12/09/2016 CLINICAL DATA:  Tonsillar head and neck cancer EXAM: RIGHT INTERNAL SINGLE LUMEN POWER PORT CATHETER INSERTION Date:  4/25/20184/25/2018 3:30 pm Radiologist:  M. Daryll Brod, MD Guidance:  Ultrasound and fluoroscopic MEDICATIONS: Ancef 2 g; The antibiotic was administered within an appropriate time interval prior to skin puncture. ANESTHESIA/SEDATION: Versed 2.0 mg IV; Fentanyl 100 mcg IV; Moderate Sedation Time:  40 minutes The patient was continuously monitored during the procedure by the interventional radiology nurse under my direct supervision. FLUOROSCOPY TIME:  42 seconds (4.0 mGy) COMPLICATIONS: None immediate. CONTRAST:  None. PROCEDURE: Informed consent was obtained from the patient following explanation of the procedure, risks, benefits and alternatives. The patient understands, agrees and consents for the procedure. All questions were addressed. A time out was performed. Maximal barrier sterile technique utilized including caps, mask, sterile gowns,  sterile gloves, large sterile drape, hand hygiene, and 2% chlorhexidine scrub. Under sterile conditions and local anesthesia, right internal jugular micropuncture venous access was performed. Access was performed with ultrasound. Images were obtained for documentation. A guide wire was inserted followed by a transitional dilator. This allowed insertion of a guide wire and catheter into the IVC. Measurements were obtained from the SVC / RA junction back to the right IJ venotomy site. In the right infraclavicular chest, a subcutaneous pocket was created over the second anterior rib. This was done under sterile conditions and local anesthesia. 1% lidocaine with epinephrine was utilized for this. A 2.5 cm incision was made in the skin. Blunt dissection was performed to create a subcutaneous pocket over the right pectoralis major muscle. The pocket was flushed with saline vigorously. There was adequate hemostasis. The port catheter was assembled and checked for leakage. The port catheter was secured in the pocket with two retention sutures. The tubing was tunneled subcutaneously to the right venotomy site and inserted into the SVC/RA junction through a valved peel-away sheath. Position was confirmed with fluoroscopy. Images  were obtained for documentation. The patient tolerated the procedure well. No immediate complications. Incisions were closed in a two layer fashion with 4 - 0 Vicryl suture. Dermabond was applied to the skin. The port catheter was accessed, blood was aspirated followed by saline and heparin flushes. Needle was removed. A dry sterile dressing was applied. IMPRESSION: Ultrasound and fluoroscopically guided right internal jugular single lumen power port catheter insertion. Tip in the SVC/RA junction. Catheter ready for use. Electronically Signed   By: Jerilynn Mages.  Shick M.D.   On: 12/09/2016 16:26     Assessment and plan- Patient is a 56 y.o. male with h/o stage I HPV-positive squamous cell carcinoma of the  oropharynx likely tonsil primary Stage 1 cTx cN1 cM0 here for assessment prior to cycle 2 of chermotherapy  Counts ok to proceed with cycle 2 of weekly cisplatin. No mucositis. Magic mouthwash for throat discomfort. He will proceed with cycle 3 of chemotherapy next week and I will see him back in 2 weeks with labs prior to cycle 4  Leg cramps- improving. Encourage hydration  Heartburn- continue dexilant. GI appointment next month   Visit Diagnosis 1. Neoplasm of oropharynx   2. Encounter for antineoplastic chemotherapy      Dr. Randa Evens, MD, MPH [facility] Pager- [phone] 01/01/2017 8:25 AM

## 2017-01-01 NOTE — Progress Notes (Signed)
Here for follow up prior to chemo number two- wanting to use ativan hs for sleep ? Some constipation c/o

## 2017-01-03 ENCOUNTER — Ambulatory Visit: Payer: BLUE CROSS/BLUE SHIELD

## 2017-01-04 ENCOUNTER — Inpatient Hospital Stay: Payer: BLUE CROSS/BLUE SHIELD

## 2017-01-04 ENCOUNTER — Ambulatory Visit: Payer: BLUE CROSS/BLUE SHIELD

## 2017-01-04 ENCOUNTER — Ambulatory Visit
Admission: RE | Admit: 2017-01-04 | Discharge: 2017-01-04 | Disposition: A | Discharge status: Home / Self Care | Payer: BLUE CROSS/BLUE SHIELD | Source: Ambulatory Visit | Attending: Radiation Oncology | Admitting: Radiation Oncology

## 2017-01-04 DIAGNOSIS — Z8042 Family history of malignant neoplasm of prostate: Secondary | ICD-10-CM | POA: Diagnosis not present

## 2017-01-04 DIAGNOSIS — R42 Dizziness and giddiness: Secondary | ICD-10-CM | POA: Diagnosis not present

## 2017-01-04 DIAGNOSIS — I10 Essential (primary) hypertension: Secondary | ICD-10-CM | POA: Diagnosis not present

## 2017-01-04 DIAGNOSIS — C770 Secondary and unspecified malignant neoplasm of lymph nodes of head, face and neck: Secondary | ICD-10-CM | POA: Diagnosis not present

## 2017-01-04 DIAGNOSIS — E7800 Pure hypercholesterolemia, unspecified: Secondary | ICD-10-CM | POA: Diagnosis not present

## 2017-01-04 DIAGNOSIS — Z79899 Other long term (current) drug therapy: Secondary | ICD-10-CM | POA: Diagnosis not present

## 2017-01-04 DIAGNOSIS — Z510 Encounter for antineoplastic radiation therapy: Secondary | ICD-10-CM | POA: Diagnosis not present

## 2017-01-04 DIAGNOSIS — C090 Malignant neoplasm of tonsillar fossa: Secondary | ICD-10-CM | POA: Diagnosis not present

## 2017-01-04 DIAGNOSIS — Z87891 Personal history of nicotine dependence: Secondary | ICD-10-CM | POA: Diagnosis not present

## 2017-01-04 DIAGNOSIS — Z85828 Personal history of other malignant neoplasm of skin: Secondary | ICD-10-CM | POA: Diagnosis not present

## 2017-01-04 NOTE — Progress Notes (Signed)
Nutrition Follow-up:  Patient seen this am after radiation appointment.  Time of radiation changed to am vs in the pm.    Patient reports he has noticed taste changes, "nothing taste right."  "I am searching for things that I can taste."  Reports no mouth sores.  Reports ate little bit of pot roast with cooked vegetables last night for dinner, ate egg this am.  Has not tried oral nutrition supplement shakes.  Reports has been drinking chocolate milk as he can taste that.     Medications: magic mouthwash added  Labs: Na 131, glucose 118, BUN 31, creatinine WDL  Anthropometrics:   Noted weight on 5/18 decreased to 157 lb 3.2 oz from initial weight of 160 lb on 4/26   Estimated Energy Needs  Kcals: 2100-2500 calories/d Protein: 88-110 g/d Fluid: 2.5 L/d  NUTRITION DIAGNOSIS: Inadequate oral intake related to cancer and cancer related treatments as evidenced by weight loss and taste changes.   MALNUTRITION DIAGNOSIS: none at this time   INTERVENTION:   Discussed strategies regarding taste changes.  Fact sheet given. Discussed calories needed to prevent weight loss.  Examples of ways to increase calories provided.  Recipes given.  Samples of oral nutrition supplements given as well.      MONITORING, EVALUATION, GOAL: Patient will consume adequate calories and protein to prevent weight loss   NEXT VISIT: Phone call May 31  Yuleimy Kretz B. Zenia Resides, [place], [place] Registered Dietitian [phone] (pager)

## 2017-01-05 ENCOUNTER — Ambulatory Visit: Payer: BLUE CROSS/BLUE SHIELD

## 2017-01-05 ENCOUNTER — Ambulatory Visit
Admission: RE | Admit: 2017-01-05 | Discharge: 2017-01-05 | Disposition: A | Discharge status: Home / Self Care | Payer: BLUE CROSS/BLUE SHIELD | Source: Ambulatory Visit | Attending: Radiation Oncology | Admitting: Radiation Oncology

## 2017-01-05 DIAGNOSIS — E7800 Pure hypercholesterolemia, unspecified: Secondary | ICD-10-CM | POA: Diagnosis not present

## 2017-01-05 DIAGNOSIS — Z8042 Family history of malignant neoplasm of prostate: Secondary | ICD-10-CM | POA: Diagnosis not present

## 2017-01-05 DIAGNOSIS — R42 Dizziness and giddiness: Secondary | ICD-10-CM | POA: Diagnosis not present

## 2017-01-05 DIAGNOSIS — Z85828 Personal history of other malignant neoplasm of skin: Secondary | ICD-10-CM | POA: Diagnosis not present

## 2017-01-05 DIAGNOSIS — I10 Essential (primary) hypertension: Secondary | ICD-10-CM | POA: Diagnosis not present

## 2017-01-05 DIAGNOSIS — Z87891 Personal history of nicotine dependence: Secondary | ICD-10-CM | POA: Diagnosis not present

## 2017-01-05 DIAGNOSIS — C090 Malignant neoplasm of tonsillar fossa: Secondary | ICD-10-CM | POA: Diagnosis not present

## 2017-01-05 DIAGNOSIS — Z510 Encounter for antineoplastic radiation therapy: Secondary | ICD-10-CM | POA: Diagnosis not present

## 2017-01-05 DIAGNOSIS — C770 Secondary and unspecified malignant neoplasm of lymph nodes of head, face and neck: Secondary | ICD-10-CM | POA: Diagnosis not present

## 2017-01-05 DIAGNOSIS — Z79899 Other long term (current) drug therapy: Secondary | ICD-10-CM | POA: Diagnosis not present

## 2017-01-06 ENCOUNTER — Emergency Department
Admission: EM | Admit: 2017-01-06 | Discharge: 2017-01-06 | Disposition: A | Discharge status: Home / Self Care | Payer: BLUE CROSS/BLUE SHIELD | Attending: Emergency Medicine | Admitting: Emergency Medicine

## 2017-01-06 ENCOUNTER — Encounter: Payer: Self-pay | Admitting: Emergency Medicine

## 2017-01-06 ENCOUNTER — Ambulatory Visit
Admission: RE | Admit: 2017-01-06 | Discharge: 2017-01-06 | Disposition: A | Discharge status: Home / Self Care | Payer: BLUE CROSS/BLUE SHIELD | Source: Ambulatory Visit | Attending: Radiation Oncology | Admitting: Radiation Oncology

## 2017-01-06 ENCOUNTER — Telehealth: Payer: Self-pay | Admitting: *Deleted

## 2017-01-06 ENCOUNTER — Ambulatory Visit: Payer: BLUE CROSS/BLUE SHIELD

## 2017-01-06 DIAGNOSIS — M79604 Pain in right leg: Secondary | ICD-10-CM | POA: Diagnosis present

## 2017-01-06 DIAGNOSIS — Z87891 Personal history of nicotine dependence: Secondary | ICD-10-CM | POA: Diagnosis not present

## 2017-01-06 DIAGNOSIS — R29898 Other symptoms and signs involving the musculoskeletal system: Secondary | ICD-10-CM | POA: Diagnosis not present

## 2017-01-06 DIAGNOSIS — Z85828 Personal history of other malignant neoplasm of skin: Secondary | ICD-10-CM | POA: Diagnosis not present

## 2017-01-06 DIAGNOSIS — G5791 Unspecified mononeuropathy of right lower limb: Secondary | ICD-10-CM | POA: Diagnosis not present

## 2017-01-06 DIAGNOSIS — Z8042 Family history of malignant neoplasm of prostate: Secondary | ICD-10-CM | POA: Diagnosis not present

## 2017-01-06 DIAGNOSIS — Z79899 Other long term (current) drug therapy: Secondary | ICD-10-CM | POA: Diagnosis not present

## 2017-01-06 DIAGNOSIS — C770 Secondary and unspecified malignant neoplasm of lymph nodes of head, face and neck: Secondary | ICD-10-CM | POA: Diagnosis not present

## 2017-01-06 DIAGNOSIS — E7800 Pure hypercholesterolemia, unspecified: Secondary | ICD-10-CM | POA: Diagnosis not present

## 2017-01-06 DIAGNOSIS — I10 Essential (primary) hypertension: Secondary | ICD-10-CM | POA: Diagnosis not present

## 2017-01-06 DIAGNOSIS — Z510 Encounter for antineoplastic radiation therapy: Secondary | ICD-10-CM | POA: Diagnosis not present

## 2017-01-06 DIAGNOSIS — C090 Malignant neoplasm of tonsillar fossa: Secondary | ICD-10-CM | POA: Diagnosis not present

## 2017-01-06 DIAGNOSIS — G5792 Unspecified mononeuropathy of left lower limb: Secondary | ICD-10-CM | POA: Insufficient documentation

## 2017-01-06 DIAGNOSIS — I70202 Unspecified atherosclerosis of native arteries of extremities, left leg: Secondary | ICD-10-CM | POA: Diagnosis not present

## 2017-01-06 DIAGNOSIS — C099 Malignant neoplasm of tonsil, unspecified: Secondary | ICD-10-CM | POA: Diagnosis not present

## 2017-01-06 DIAGNOSIS — M791 Myalgia: Secondary | ICD-10-CM | POA: Diagnosis not present

## 2017-01-06 DIAGNOSIS — I70201 Unspecified atherosclerosis of native arteries of extremities, right leg: Secondary | ICD-10-CM | POA: Diagnosis not present

## 2017-01-06 DIAGNOSIS — R42 Dizziness and giddiness: Secondary | ICD-10-CM | POA: Diagnosis not present

## 2017-01-06 DIAGNOSIS — M792 Neuralgia and neuritis, unspecified: Secondary | ICD-10-CM

## 2017-01-06 LAB — CBC WITH DIFFERENTIAL/PLATELET
BASOS ABS: 0 10*3/uL (ref 0–0.1)
Basophils Relative: 0 %
Eosinophils Absolute: 0.1 10*3/uL (ref 0–0.7)
Eosinophils Relative: 1 %
HCT: 37.7 % — ABNORMAL LOW (ref 40.0–52.0)
HEMOGLOBIN: 12.9 g/dL — AB (ref 13.0–18.0)
LYMPHS ABS: 1 10*3/uL (ref 1.0–3.6)
Lymphocytes Relative: 11 %
MCH: 28.4 pg (ref 26.0–34.0)
MCHC: 34.2 g/dL (ref 32.0–36.0)
MCV: 82.8 fL (ref 80.0–100.0)
Monocytes Absolute: 1.2 10*3/uL — ABNORMAL HIGH (ref 0.2–1.0)
Monocytes Relative: 12 %
Neutro Abs: 7.3 10*3/uL — ABNORMAL HIGH (ref 1.4–6.5)
Neutrophils Relative %: 76 %
PLATELETS: 222 10*3/uL (ref 150–440)
RBC: 4.55 MIL/uL (ref 4.40–5.90)
RDW: 13.7 % (ref 11.5–14.5)
WBC: 9.7 10*3/uL (ref 3.8–10.6)

## 2017-01-06 LAB — COMPREHENSIVE METABOLIC PANEL
ALT: 79 U/L — AB (ref 17–63)
ANION GAP: 8 (ref 5–15)
AST: 25 U/L (ref 15–41)
Albumin: 3.5 g/dL (ref 3.5–5.0)
Alkaline Phosphatase: 66 U/L (ref 38–126)
BUN: 32 mg/dL — ABNORMAL HIGH (ref 6–20)
CHLORIDE: 101 mmol/L (ref 101–111)
CO2: 26 mmol/L (ref 22–32)
Calcium: 9.2 mg/dL (ref 8.9–10.3)
Creatinine, Ser: 1.09 mg/dL (ref 0.61–1.24)
GFR calc Af Amer: >60 mL/min (ref >60)
GFR calc non Af Amer: >60 mL/min (ref >60)
Glucose, Bld: 113 mg/dL — ABNORMAL HIGH (ref 65–99)
POTASSIUM: 4.6 mmol/L (ref 3.5–5.1)
SODIUM: 135 mmol/L (ref 135–145)
Total Bilirubin: 0.4 mg/dL (ref 0.3–1.2)
Total Protein: 6.7 g/dL (ref 6.5–8.1)

## 2017-01-06 LAB — CK: CK TOTAL: 61 U/L (ref 49–397)

## 2017-01-06 LAB — MAGNESIUM: Magnesium: 1.8 mg/dL (ref 1.7–2.4)

## 2017-01-06 MED ORDER — OXYCODONE HCL 5 MG PO TABS
5.0000 mg | ORAL_TABLET | Freq: Once | ORAL | Status: AC
  Administered 2017-01-06: 5 mg via ORAL
  Filled 2017-01-06: qty 1

## 2017-01-06 MED ORDER — OXYCODONE-ACETAMINOPHEN 5-325 MG PO TABS
2.0000 | ORAL_TABLET | Freq: Once | ORAL | Status: AC
  Administered 2017-01-06: 2 via ORAL
  Filled 2017-01-06: qty 2

## 2017-01-06 MED ORDER — MIDAZOLAM HCL 2 MG/2ML IJ SOLN
2.0000 mg | Freq: Once | INTRAMUSCULAR | Status: DC
  Filled 2017-01-06: qty 2

## 2017-01-06 MED ORDER — OXYCODONE-ACETAMINOPHEN 5-325 MG PO TABS: 1.0000 | ORAL_TABLET | ORAL | 0 refills | Status: DC | PRN

## 2017-01-06 MED ORDER — ONDANSETRON HCL 4 MG PO TABS: 4.0000 mg | ORAL_TABLET | Freq: Three times a day (TID) | ORAL | 0 refills | Status: DC | PRN

## 2017-01-06 MED ORDER — MIDAZOLAM HCL 2 MG/2ML IJ SOLN
1.0000 mg | Freq: Once | INTRAMUSCULAR | Status: AC
  Administered 2017-01-06: 1 mg via INTRAVENOUS

## 2017-01-06 MED ORDER — ONDANSETRON HCL 4 MG/2ML IJ SOLN
INTRAMUSCULAR | Status: AC
  Administered 2017-01-06: 4 mg via INTRAVENOUS
  Filled 2017-01-06: qty 2

## 2017-01-06 MED ORDER — BISACODYL 5 MG PO TBEC: 5.0000 mg | DELAYED_RELEASE_TABLET | Freq: Every day | ORAL | 1 refills | Status: DC

## 2017-01-06 MED ORDER — ONDANSETRON HCL 4 MG/2ML IJ SOLN
4.0000 mg | Freq: Once | INTRAMUSCULAR | Status: AC
  Administered 2017-01-06: 4 mg via INTRAVENOUS

## 2017-01-06 MED ORDER — MORPHINE SULFATE (PF) 4 MG/ML IV SOLN: 4.0000 mg | INTRAVENOUS | Status: DC | PRN

## 2017-01-06 MED ORDER — MORPHINE SULFATE (PF) 4 MG/ML IV SOLN
4.0000 mg | Freq: Once | INTRAVENOUS | Status: AC
  Administered 2017-01-06: 4 mg via INTRAVENOUS
  Filled 2017-01-06: qty 1

## 2017-01-06 NOTE — ED Triage Notes (Signed)
Patient presents to ED via POV from home with c/o bilateral leg numbness that started last week. Patient able to move both feet. Patient is a cancer patient, throat cancer. Takes oral chemo every Friday and has radiation daily. No slurred speech noted. Equal and symmetrical hand grasps noted.

## 2017-01-06 NOTE — Discharge Instructions (Signed)
Please seek medical attention for any high fevers, chest pain, shortness of breath, change in behavior, persistent vomiting, bloody stool or any other new or concerning symptoms.  

## 2017-01-06 NOTE — ED Provider Notes (Signed)
Patient felt better after IV pain medication. He did feel comfortable going home. Will give patient one dose of oral narcotics prior to discharging home. The patient will be given prescription for further narcotic pain medication. Did check the [facility] drug database prior to prescribing.   Nance Pear, MD 01/06/17 [phone]

## 2017-01-06 NOTE — Telephone Encounter (Signed)
Called to ask what to do about the fact that his legs and feet are numb and have no feeling. Advised to go to ER. She repeated this back to me and asked if he should go to [facility] ER, I stated yes,

## 2017-01-06 NOTE — ED Provider Notes (Signed)
[facility] Emergency Department Provider Note  ____________________________________________   First MD Initiated Contact with Patient 01/06/17 1829     (approximate)  I have reviewed the triage vital signs and the nursing notes.   HISTORY  Chief Complaint Numbness    HPI Timothy Garrison is a 56 y.o. male with a history of tonsillar cancer with metastatic disease who is currently undergoing weekly chemotherapy with cisplatin and daily radiation treatment.  He is under the care of Dr. Janese Banks.  He presents for persistent bilateral leg pain that is severe and unrelenting.  It is worse on the left but present on both sides.  He describes it as cramping and aching pain.  Nothing in particular makes it better nor worse.  He was seen about 5 days ago and had bilateral lower extremity ultrasounds that were normal and did not demonstrate any DVT.  He states that the pain went away for a couple of days and then came back very severe and has not gotten any better.  He denies any weakness in his legs although it is difficult for him to ambulate due to the pain.  He has not yet had the opportunity to discuss the symptoms with Dr. Janese Banks.  He denies fever/chills, chest pain or shortness of breath, nausea, vomiting, abdominal pain, dysuria.   Past Medical History:  Diagnosis Date  . Basal cell carcinoma (BCC) of back   . Basal cell carcinoma (BCC) of nasal sidewall   . Dizziness   . GERD (gastroesophageal reflux disease)   . Hypercholesterolemia   . Hypertension    hx of HBP readings  . Metastatic squamous cell carcinoma ([place]) 2018   bx cervical lymph node    Patient Active Problem List   Diagnosis Date Noted  . Goals of care, counseling/discussion 12/25/2016  . Neoplasm of oropharynx 12/07/2016  . Neck mass 10/07/2016  . Health care maintenance 11/03/2014  . Daytime somnolence 04/15/2014  . Neck pain 09/16/2013  . GERD (gastroesophageal reflux disease) 01/23/2013    . Dizziness 01/23/2013  . Essential hypertension, benign 01/17/2013  . Hypercholesterolemia 01/17/2013    Past Surgical History:  Procedure Laterality Date  . COLONOSCOPY    . ESOPHAGOGASTRODUODENOSCOPY (EGD) WITH PROPOFOL N/A 05/24/2015   Procedure: ESOPHAGOGASTRODUODENOSCOPY (EGD) WITH PROPOFOL;  Surgeon: Manya Silvas, MD;  Location: [facility] ENDOSCOPY;  Service: Endoscopy;  Laterality: N/A;  . IR FLUORO GUIDE PORT INSERTION LEFT  12/09/2016  . SAVORY DILATION N/A 05/24/2015   Procedure: SAVORY DILATION;  Surgeon: Manya Silvas, MD;  Location: [facility] ENDOSCOPY;  Service: Endoscopy;  Laterality: N/A;    Prior to Admission medications   Medication Sig Start Date End Date Taking? Authorizing Provider  DENTA 5000 PLUS 1.1 % CREA dental cream  12/22/16  Yes [provider]  dexamethasone (DECADRON) 4 MG tablet Take 2 tablets by mouth once a day on the day after chemotherapy and then take 2 tablets two times a day for 2 days. Take with food. 12/07/16  Yes Sindy Guadeloupe, MD  dexlansoprazole (DEXILANT) 60 MG capsule Take 1 capsule (60 mg total) by mouth daily. 12/21/16  Yes Sindy Guadeloupe, MD  lisinopril (PRINIVIL,ZESTRIL) 30 MG tablet TAKE 1 TABLET (30 MG TOTAL) BY MOUTH DAILY. 12/21/16  Yes Einar Pheasant, MD  lidocaine-prilocaine (EMLA) cream Apply to affected area once 12/07/16   Sindy Guadeloupe, MD  LORazepam (ATIVAN) 0.5 MG tablet Take 1 tablet (0.5 mg total) by mouth every 6 (six) hours as needed (  Nausea or vomiting). 12/07/16   Sindy Guadeloupe, MD  magic mouthwash SOLN Take 5 mLs by mouth 4 (four) times daily as needed for mouth pain. 01/01/17   Sindy Guadeloupe, MD  omeprazole (PRILOSEC) 20 MG capsule TAKE 1 CAPSULE (20 MG TOTAL) BY MOUTH 2 (TWO) TIMES DAILY. Patient not taking: Reported on 01/06/2017 03/23/16   Einar Pheasant, MD  ondansetron (ZOFRAN) 8 MG tablet Take 1 tablet (8 mg total) by mouth 2 (two) times daily as needed. Start on the third day after chemotherapy. 12/07/16   Sindy Guadeloupe, MD  prochlorperazine (COMPAZINE) 10 MG tablet Take 1 tablet (10 mg total) by mouth every 6 (six) hours as needed (Nausea or vomiting). 12/07/16   Sindy Guadeloupe, MD  sucralfate (CARAFATE) 1 g tablet Take 1 tablet (1 g total) by mouth 4 (four) times daily -  with meals and at bedtime. 12/25/16   Sindy Guadeloupe, MD    Allergies Patient has no known allergies.  Family History  Problem Relation Age of Onset  . Hyperlipidemia Mother   . Heart disease Mother   . Hypertension Mother   . Alzheimer's disease Mother   . Prostate cancer Father   . Stroke Father     Social History Social History  Substance Use Topics  . Smoking status: Former Smoker    Quit date: 11/11/1996  . Smokeless tobacco: Never Used  . Alcohol use 0.0 oz/week     Comment: occasionally    Review of Systems Constitutional: No fever/chills Eyes: No visual changes. ENT: No sore throat. Cardiovascular: Denies chest pain. Respiratory: Denies shortness of breath. Gastrointestinal: No abdominal pain.  No nausea, no vomiting.  No diarrhea.  No constipation. Genitourinary: Negative for dysuria. Musculoskeletal: Severe pain in bilateral lower extremities that feels aching and cramping.  Negative for neck pain.  Negative for back pain. Integumentary: Negative for rash. Neurological: Negative for headaches, focal weakness or numbness.   ____________________________________________   PHYSICAL EXAM:  VITAL SIGNS: ED Triage Vitals [01/06/17 1722]  Enc Vitals Group     BP (!) 152/95     Pulse Rate (!) 105     Resp 17     Temp 98.3 F (36.8 C)     Temp Source Oral     SpO2 98 %     Weight 70.3 kg (155 lb)     Height 1.702 m (5\' 7" )     Head Circumference      Peak Flow      Pain Score 8     Pain Loc      Pain Edu?      Excl. in [place]?     Constitutional: Alert and oriented.  Generally well-appearing but does appear uncomfortable. Eyes: Conjunctivae are normal.  Head: Atraumatic. Nose: No  congestion/rhinnorhea. Mouth/Throat: Mucous membranes are moist. Neck: No stridor.  No meningeal signs.   Cardiovascular: Borderline tachycardia, regular rhythm. Good peripheral circulation. Grossly normal heart sounds. Respiratory: Normal respiratory effort.  No retractions. Lungs CTAB. Gastrointestinal: Soft and nontender. No distention.  Musculoskeletal: No lower extremity tenderness nor edema. No gross deformities of extremities.  His muscles and compartments are soft.  He does not appear to be cramping although the patient describes the sensation of cramping.  There are no clinical signs of DVT. Neurologic:  Normal speech and language. No gross focal neurologic deficits are appreciated.  Skin:  Skin is warm, dry and intact. No rash noted. Psychiatric: Mood and affect are normal. Speech and  behavior are normal.  ____________________________________________   LABS (all labs ordered are listed, but only abnormal results are displayed)  Labs Reviewed  COMPREHENSIVE METABOLIC PANEL - Abnormal; Notable for the following:       Result Value   Glucose, Bld 113 (*)    BUN 32 (*)    ALT 79 (*)    All other components within normal limits  CBC WITH DIFFERENTIAL/PLATELET - Abnormal; Notable for the following:    Hemoglobin 12.9 (*)    HCT 37.7 (*)    Neutro Abs 7.3 (*)    Monocytes Absolute 1.2 (*)    All other components within normal limits  MAGNESIUM  CK   ____________________________________________  EKG  None - EKG not ordered by ED physician ____________________________________________  RADIOLOGY   No results found.  ____________________________________________   PROCEDURES  Critical Care performed: No   Procedure(s) performed:   Procedures   ____________________________________________   INITIAL IMPRESSION / ASSESSMENT AND PLAN / ED COURSE  Pertinent labs & imaging results that were available during my care of the patient were reviewed by me and  considered in my medical decision making (see chart for details).  The patient does appear comfortable but his vital signs are stable with borderline tachycardia.  His recent ultrasound was normal.  His lab work is again normal and his electrolytes are all within normal limits.  I discussed the case with Dr. Rogue Bussing and he felt that repeating the ultrasound was not recommended or indicated given the likely possibility that he has developed DVTs since the last imaging.  He suggested adding on a CK which is also normal.  In the absence of any other findings, he recommended starting the patient on pain medication and having him follow-up as an outpatient with Dr. Janese Banks.  I provided 1 mg of IV Versed which did not make the pain improve or resolve.  I am giving 2 Percocets and will reassess and discussed the plan with the patient and his wife.   Clinical Course as of Jan 06 2037  Wed Jan 06, 2017  2033 The patient reports that his pain is still severe, 10 out of 10, absolutely unchanged with the Versed 1 mg IV and after taking 2 Percocets by mouth.  I had a lengthy conversation with him and his wife and daughter about no findings to support anything new or acute.  I also pointed out that his relatively recent PET scan showed no evidence of any spinal involvement which would cause neuropathic pain.  He said that he understands but he absolutely cannot walk due to the severe pain and that if we send him home he have to call EMS to come back because he cannot deal with the pain.  He also states that with this degree of pain he would not be able to go to his radiation treatments tomorrow.  His wife and daughter are tried to talk him into going home but he does not think he will be able to do so.I explained that admissions for intractable pain typically are unsatisfying for the patient because he will be discharged tomorrow and likely without any explanation for the pain but he says that is fine because he cannot  stand it tonight.  I have ordered a dose of morphine to see if this will help and have ordered 2 additional doses to see if additional dosing of IV medications will resolve the issue to the point that he feels comfortable going home.  If not he  may require admission for intractable pain and in person evaluation with oncology tomorrow.  Transferring ED care to Dr. Archie Balboa for further follow up and reassessment.  [CF]    Clinical Course User Index [CF] Hinda Kehr, MD    ____________________________________________  FINAL CLINICAL IMPRESSION(S) / ED DIAGNOSES  Final diagnoses:  Intractable neuropathic pain of left lower extremity  Intractable neuropathic pain of right lower extremity     MEDICATIONS GIVEN DURING THIS VISIT:  Medications  morphine 4 MG/ML injection 4 mg (not administered)  morphine 4 MG/ML injection 4 mg (not administered)  midazolam (VERSED) injection 1 mg (1 mg Intravenous Given 01/06/17 1905)  oxyCODONE-acetaminophen (PERCOCET/ROXICET) 5-325 MG per tablet 2 tablet (2 tablets Oral Given 01/06/17 2011)     NEW OUTPATIENT MEDICATIONS STARTED DURING THIS VISIT:  New Prescriptions   No medications on file    Modified Medications   No medications on file    Discontinued Medications   MULTIPLE VITAMINS-MINERALS (MULTIVITAMIN PO)    Take by mouth daily.     Note:  This document was prepared using Dragon voice recognition software and may include unintentional dictation errors.    Hinda Kehr, MD 01/06/17 2038

## 2017-01-06 NOTE — ED Notes (Signed)
Pt discharged to home.  Family member driving.  Discharge instructions reviewed.  Verbalized understanding.  No questions or concerns at this time.  Teach back verified.  Pt in NAD.  No items left in ED.   

## 2017-01-07 ENCOUNTER — Ambulatory Visit: Payer: BLUE CROSS/BLUE SHIELD

## 2017-01-07 DIAGNOSIS — E871 Hypo-osmolality and hyponatremia: Secondary | ICD-10-CM | POA: Diagnosis not present

## 2017-01-07 DIAGNOSIS — E782 Mixed hyperlipidemia: Secondary | ICD-10-CM | POA: Diagnosis not present

## 2017-01-07 DIAGNOSIS — R202 Paresthesia of skin: Secondary | ICD-10-CM | POA: Diagnosis not present

## 2017-01-07 DIAGNOSIS — Z87891 Personal history of nicotine dependence: Secondary | ICD-10-CM | POA: Diagnosis not present

## 2017-01-07 DIAGNOSIS — J9811 Atelectasis: Secondary | ICD-10-CM | POA: Diagnosis not present

## 2017-01-07 DIAGNOSIS — C099 Malignant neoplasm of tonsil, unspecified: Secondary | ICD-10-CM | POA: Diagnosis not present

## 2017-01-07 DIAGNOSIS — I7409 Other arterial embolism and thrombosis of abdominal aorta: Secondary | ICD-10-CM | POA: Diagnosis not present

## 2017-01-07 DIAGNOSIS — R29898 Other symptoms and signs involving the musculoskeletal system: Secondary | ICD-10-CM | POA: Diagnosis not present

## 2017-01-07 DIAGNOSIS — I70202 Unspecified atherosclerosis of native arteries of extremities, left leg: Secondary | ICD-10-CM | POA: Diagnosis not present

## 2017-01-07 DIAGNOSIS — D62 Acute posthemorrhagic anemia: Secondary | ICD-10-CM | POA: Diagnosis not present

## 2017-01-07 DIAGNOSIS — I2510 Atherosclerotic heart disease of native coronary artery without angina pectoris: Secondary | ICD-10-CM | POA: Diagnosis not present

## 2017-01-07 DIAGNOSIS — I7410 Embolism and thrombosis of unspecified parts of aorta: Secondary | ICD-10-CM | POA: Diagnosis not present

## 2017-01-07 DIAGNOSIS — K5903 Drug induced constipation: Secondary | ICD-10-CM | POA: Diagnosis not present

## 2017-01-07 DIAGNOSIS — R52 Pain, unspecified: Secondary | ICD-10-CM | POA: Diagnosis not present

## 2017-01-07 DIAGNOSIS — I743 Embolism and thrombosis of arteries of the lower extremities: Secondary | ICD-10-CM | POA: Diagnosis not present

## 2017-01-07 DIAGNOSIS — I745 Embolism and thrombosis of iliac artery: Secondary | ICD-10-CM | POA: Diagnosis not present

## 2017-01-07 DIAGNOSIS — I739 Peripheral vascular disease, unspecified: Secondary | ICD-10-CM | POA: Diagnosis not present

## 2017-01-07 DIAGNOSIS — R0989 Other specified symptoms and signs involving the circulatory and respiratory systems: Secondary | ICD-10-CM | POA: Diagnosis not present

## 2017-01-07 DIAGNOSIS — I70201 Unspecified atherosclerosis of native arteries of extremities, right leg: Secondary | ICD-10-CM | POA: Diagnosis not present

## 2017-01-07 DIAGNOSIS — K219 Gastro-esophageal reflux disease without esophagitis: Secondary | ICD-10-CM | POA: Diagnosis not present

## 2017-01-07 DIAGNOSIS — M791 Myalgia: Secondary | ICD-10-CM | POA: Diagnosis not present

## 2017-01-07 DIAGNOSIS — R911 Solitary pulmonary nodule: Secondary | ICD-10-CM | POA: Diagnosis not present

## 2017-01-07 DIAGNOSIS — I10 Essential (primary) hypertension: Secondary | ICD-10-CM | POA: Diagnosis not present

## 2017-01-08 ENCOUNTER — Inpatient Hospital Stay: Payer: BLUE CROSS/BLUE SHIELD | Admitting: Oncology

## 2017-01-08 ENCOUNTER — Inpatient Hospital Stay: Payer: BLUE CROSS/BLUE SHIELD

## 2017-01-08 ENCOUNTER — Ambulatory Visit: Payer: BLUE CROSS/BLUE SHIELD

## 2017-01-09 DIAGNOSIS — I743 Embolism and thrombosis of arteries of the lower extremities: Secondary | ICD-10-CM | POA: Diagnosis not present

## 2017-01-09 DIAGNOSIS — R0989 Other specified symptoms and signs involving the circulatory and respiratory systems: Secondary | ICD-10-CM | POA: Insufficient documentation

## 2017-01-09 DIAGNOSIS — I7410 Embolism and thrombosis of unspecified parts of aorta: Secondary | ICD-10-CM | POA: Diagnosis not present

## 2017-01-09 DIAGNOSIS — I739 Peripheral vascular disease, unspecified: Secondary | ICD-10-CM | POA: Diagnosis not present

## 2017-01-09 DIAGNOSIS — M791 Myalgia: Secondary | ICD-10-CM | POA: Diagnosis not present

## 2017-01-09 DIAGNOSIS — I7409 Other arterial embolism and thrombosis of abdominal aorta: Secondary | ICD-10-CM | POA: Diagnosis not present

## 2017-01-10 DIAGNOSIS — I7409 Other arterial embolism and thrombosis of abdominal aorta: Secondary | ICD-10-CM | POA: Diagnosis not present

## 2017-01-10 DIAGNOSIS — E871 Hypo-osmolality and hyponatremia: Secondary | ICD-10-CM | POA: Diagnosis not present

## 2017-01-10 DIAGNOSIS — D62 Acute posthemorrhagic anemia: Secondary | ICD-10-CM | POA: Diagnosis not present

## 2017-01-10 DIAGNOSIS — R52 Pain, unspecified: Secondary | ICD-10-CM | POA: Diagnosis not present

## 2017-01-12 ENCOUNTER — Ambulatory Visit: Payer: BLUE CROSS/BLUE SHIELD

## 2017-01-12 DIAGNOSIS — I2510 Atherosclerotic heart disease of native coronary artery without angina pectoris: Secondary | ICD-10-CM | POA: Diagnosis not present

## 2017-01-12 DIAGNOSIS — I10 Essential (primary) hypertension: Secondary | ICD-10-CM | POA: Diagnosis not present

## 2017-01-12 DIAGNOSIS — D62 Acute posthemorrhagic anemia: Secondary | ICD-10-CM | POA: Diagnosis not present

## 2017-01-12 DIAGNOSIS — J9811 Atelectasis: Secondary | ICD-10-CM | POA: Diagnosis not present

## 2017-01-12 DIAGNOSIS — R911 Solitary pulmonary nodule: Secondary | ICD-10-CM | POA: Diagnosis not present

## 2017-01-12 DIAGNOSIS — M791 Myalgia: Secondary | ICD-10-CM | POA: Diagnosis not present

## 2017-01-13 ENCOUNTER — Ambulatory Visit: Payer: BLUE CROSS/BLUE SHIELD

## 2017-01-13 DIAGNOSIS — M791 Myalgia: Secondary | ICD-10-CM | POA: Diagnosis not present

## 2017-01-14 ENCOUNTER — Ambulatory Visit: Payer: BLUE CROSS/BLUE SHIELD

## 2017-01-14 ENCOUNTER — Inpatient Hospital Stay: Payer: BLUE CROSS/BLUE SHIELD

## 2017-01-14 ENCOUNTER — Ambulatory Visit
Admission: RE | Admit: 2017-01-14 | Discharge: 2017-01-14 | Disposition: A | Discharge status: Home / Self Care | Payer: BLUE CROSS/BLUE SHIELD | Source: Ambulatory Visit | Attending: Radiation Oncology | Admitting: Radiation Oncology

## 2017-01-14 DIAGNOSIS — E7800 Pure hypercholesterolemia, unspecified: Secondary | ICD-10-CM | POA: Diagnosis not present

## 2017-01-14 DIAGNOSIS — Z510 Encounter for antineoplastic radiation therapy: Secondary | ICD-10-CM | POA: Diagnosis not present

## 2017-01-14 DIAGNOSIS — Z85828 Personal history of other malignant neoplasm of skin: Secondary | ICD-10-CM | POA: Diagnosis not present

## 2017-01-14 DIAGNOSIS — C090 Malignant neoplasm of tonsillar fossa: Secondary | ICD-10-CM | POA: Diagnosis not present

## 2017-01-14 DIAGNOSIS — Z79899 Other long term (current) drug therapy: Secondary | ICD-10-CM | POA: Diagnosis not present

## 2017-01-14 DIAGNOSIS — Z87891 Personal history of nicotine dependence: Secondary | ICD-10-CM | POA: Diagnosis not present

## 2017-01-14 DIAGNOSIS — Z8042 Family history of malignant neoplasm of prostate: Secondary | ICD-10-CM | POA: Diagnosis not present

## 2017-01-14 DIAGNOSIS — I10 Essential (primary) hypertension: Secondary | ICD-10-CM | POA: Diagnosis not present

## 2017-01-14 DIAGNOSIS — C770 Secondary and unspecified malignant neoplasm of lymph nodes of head, face and neck: Secondary | ICD-10-CM | POA: Diagnosis not present

## 2017-01-14 DIAGNOSIS — R42 Dizziness and giddiness: Secondary | ICD-10-CM | POA: Diagnosis not present

## 2017-01-14 NOTE — Progress Notes (Signed)
Nutrition Follow-up:  Spoke with patient's wife Timothy Garrison via phone this pm.  Patient recently had a procedure at [facility] and just got home last night.  Wife reports that while he was a [facility] the oncologist look at that back of his throat and saw a blister.  Patient's appetite has decreased over the last 5 days per wife.  Wife reports patient's daughter Timothy Garrison was able to get one ensure in him today but didn't tell him what it was.    Medications: reviewed  Labs: reviewed  Anthropometrics:    Noted weight of 155 lb on 5/23, decreased from initial weight of 160 lb on 4/26  3% weight loss in 1 month   Estimated Energy Needs  Kcals: 2100-2500 calories/d Protein: 88-110 g/d Fluid: 2.5 L/d  NUTRITION DIAGNOSIS: Inadequate oral intake continues   MALNUTRITION DIAGNOSIS: continue to monitor   INTERVENTION:   Encouraged wife to give patient at least 3-4 oral nutrition supplements per day (at least 350 calories) to supplement current intake to prevent weight loss.  Talked about ways of increasing calories and protein of oral nutrition supplements.  Wife reports she remembers seeing recipes for shakes from last week's visit.  MONITORING, EVALUATION, GOAL: Patient will consume adequate calories and protein to prevent weight loss   NEXT VISIT: June 7th after radiation   Timothy Garrison, [place], [place] Registered Dietitian [phone] (pager)

## 2017-01-15 ENCOUNTER — Ambulatory Visit
Admission: RE | Admit: 2017-01-15 | Discharge: 2017-01-15 | Disposition: A | Discharge status: Home / Self Care | Payer: BLUE CROSS/BLUE SHIELD | Source: Ambulatory Visit | Attending: Radiation Oncology | Admitting: Radiation Oncology

## 2017-01-15 ENCOUNTER — Ambulatory Visit: Payer: BLUE CROSS/BLUE SHIELD

## 2017-01-15 ENCOUNTER — Inpatient Hospital Stay: Payer: BLUE CROSS/BLUE SHIELD

## 2017-01-15 ENCOUNTER — Inpatient Hospital Stay: Payer: BLUE CROSS/BLUE SHIELD | Admitting: Oncology

## 2017-01-15 DIAGNOSIS — I7409 Other arterial embolism and thrombosis of abdominal aorta: Secondary | ICD-10-CM | POA: Diagnosis not present

## 2017-01-15 DIAGNOSIS — Z7901 Long term (current) use of anticoagulants: Secondary | ICD-10-CM | POA: Diagnosis not present

## 2017-01-15 DIAGNOSIS — Z85828 Personal history of other malignant neoplasm of skin: Secondary | ICD-10-CM | POA: Diagnosis not present

## 2017-01-15 DIAGNOSIS — Z87891 Personal history of nicotine dependence: Secondary | ICD-10-CM | POA: Diagnosis not present

## 2017-01-15 DIAGNOSIS — C090 Malignant neoplasm of tonsillar fossa: Secondary | ICD-10-CM | POA: Diagnosis not present

## 2017-01-15 DIAGNOSIS — I10 Essential (primary) hypertension: Secondary | ICD-10-CM | POA: Diagnosis not present

## 2017-01-15 DIAGNOSIS — R42 Dizziness and giddiness: Secondary | ICD-10-CM | POA: Diagnosis not present

## 2017-01-15 DIAGNOSIS — E7800 Pure hypercholesterolemia, unspecified: Secondary | ICD-10-CM | POA: Diagnosis not present

## 2017-01-15 DIAGNOSIS — Z8042 Family history of malignant neoplasm of prostate: Secondary | ICD-10-CM | POA: Diagnosis not present

## 2017-01-15 DIAGNOSIS — C099 Malignant neoplasm of tonsil, unspecified: Secondary | ICD-10-CM | POA: Diagnosis not present

## 2017-01-15 DIAGNOSIS — Z510 Encounter for antineoplastic radiation therapy: Secondary | ICD-10-CM | POA: Diagnosis not present

## 2017-01-15 DIAGNOSIS — K219 Gastro-esophageal reflux disease without esophagitis: Secondary | ICD-10-CM | POA: Diagnosis not present

## 2017-01-15 DIAGNOSIS — Z48812 Encounter for surgical aftercare following surgery on the circulatory system: Secondary | ICD-10-CM | POA: Diagnosis not present

## 2017-01-15 DIAGNOSIS — Z79899 Other long term (current) drug therapy: Secondary | ICD-10-CM | POA: Diagnosis not present

## 2017-01-15 DIAGNOSIS — C770 Secondary and unspecified malignant neoplasm of lymph nodes of head, face and neck: Secondary | ICD-10-CM | POA: Diagnosis not present

## 2017-01-16 ENCOUNTER — Ambulatory Visit: Payer: BLUE CROSS/BLUE SHIELD

## 2017-01-17 ENCOUNTER — Ambulatory Visit: Payer: BLUE CROSS/BLUE SHIELD

## 2017-01-18 ENCOUNTER — Ambulatory Visit: Payer: BLUE CROSS/BLUE SHIELD

## 2017-01-18 ENCOUNTER — Ambulatory Visit
Admission: RE | Admit: 2017-01-18 | Discharge: 2017-01-18 | Disposition: A | Discharge status: Home / Self Care | Payer: BLUE CROSS/BLUE SHIELD | Source: Ambulatory Visit | Attending: Radiation Oncology | Admitting: Radiation Oncology

## 2017-01-18 DIAGNOSIS — Z85828 Personal history of other malignant neoplasm of skin: Secondary | ICD-10-CM | POA: Diagnosis not present

## 2017-01-18 DIAGNOSIS — Z8042 Family history of malignant neoplasm of prostate: Secondary | ICD-10-CM | POA: Diagnosis not present

## 2017-01-18 DIAGNOSIS — R42 Dizziness and giddiness: Secondary | ICD-10-CM | POA: Diagnosis not present

## 2017-01-18 DIAGNOSIS — C090 Malignant neoplasm of tonsillar fossa: Secondary | ICD-10-CM | POA: Diagnosis not present

## 2017-01-18 DIAGNOSIS — E7800 Pure hypercholesterolemia, unspecified: Secondary | ICD-10-CM | POA: Diagnosis not present

## 2017-01-18 DIAGNOSIS — I10 Essential (primary) hypertension: Secondary | ICD-10-CM | POA: Diagnosis not present

## 2017-01-18 DIAGNOSIS — C770 Secondary and unspecified malignant neoplasm of lymph nodes of head, face and neck: Secondary | ICD-10-CM | POA: Diagnosis not present

## 2017-01-18 DIAGNOSIS — Z510 Encounter for antineoplastic radiation therapy: Secondary | ICD-10-CM | POA: Diagnosis not present

## 2017-01-18 DIAGNOSIS — Z87891 Personal history of nicotine dependence: Secondary | ICD-10-CM | POA: Diagnosis not present

## 2017-01-18 DIAGNOSIS — Z79899 Other long term (current) drug therapy: Secondary | ICD-10-CM | POA: Diagnosis not present

## 2017-01-19 ENCOUNTER — Ambulatory Visit: Payer: BLUE CROSS/BLUE SHIELD

## 2017-01-19 ENCOUNTER — Ambulatory Visit
Admission: RE | Admit: 2017-01-19 | Discharge: 2017-01-19 | Disposition: A | Discharge status: Home / Self Care | Payer: BLUE CROSS/BLUE SHIELD | Source: Ambulatory Visit | Attending: Radiation Oncology | Admitting: Radiation Oncology

## 2017-01-19 DIAGNOSIS — Z8042 Family history of malignant neoplasm of prostate: Secondary | ICD-10-CM | POA: Diagnosis not present

## 2017-01-19 DIAGNOSIS — E7800 Pure hypercholesterolemia, unspecified: Secondary | ICD-10-CM | POA: Diagnosis not present

## 2017-01-19 DIAGNOSIS — C090 Malignant neoplasm of tonsillar fossa: Secondary | ICD-10-CM | POA: Diagnosis not present

## 2017-01-19 DIAGNOSIS — Z87891 Personal history of nicotine dependence: Secondary | ICD-10-CM | POA: Diagnosis not present

## 2017-01-19 DIAGNOSIS — Z48812 Encounter for surgical aftercare following surgery on the circulatory system: Secondary | ICD-10-CM | POA: Diagnosis not present

## 2017-01-19 DIAGNOSIS — C099 Malignant neoplasm of tonsil, unspecified: Secondary | ICD-10-CM | POA: Diagnosis not present

## 2017-01-19 DIAGNOSIS — C770 Secondary and unspecified malignant neoplasm of lymph nodes of head, face and neck: Secondary | ICD-10-CM | POA: Diagnosis not present

## 2017-01-19 DIAGNOSIS — Z79899 Other long term (current) drug therapy: Secondary | ICD-10-CM | POA: Diagnosis not present

## 2017-01-19 DIAGNOSIS — Z510 Encounter for antineoplastic radiation therapy: Secondary | ICD-10-CM | POA: Diagnosis not present

## 2017-01-19 DIAGNOSIS — I7409 Other arterial embolism and thrombosis of abdominal aorta: Secondary | ICD-10-CM | POA: Diagnosis not present

## 2017-01-19 DIAGNOSIS — Z7901 Long term (current) use of anticoagulants: Secondary | ICD-10-CM | POA: Diagnosis not present

## 2017-01-19 DIAGNOSIS — K219 Gastro-esophageal reflux disease without esophagitis: Secondary | ICD-10-CM | POA: Diagnosis not present

## 2017-01-19 DIAGNOSIS — Z85828 Personal history of other malignant neoplasm of skin: Secondary | ICD-10-CM | POA: Diagnosis not present

## 2017-01-19 DIAGNOSIS — I10 Essential (primary) hypertension: Secondary | ICD-10-CM | POA: Diagnosis not present

## 2017-01-19 DIAGNOSIS — R42 Dizziness and giddiness: Secondary | ICD-10-CM | POA: Diagnosis not present

## 2017-01-20 ENCOUNTER — Ambulatory Visit: Payer: BLUE CROSS/BLUE SHIELD

## 2017-01-20 ENCOUNTER — Ambulatory Visit
Admission: RE | Admit: 2017-01-20 | Discharge: 2017-01-20 | Disposition: A | Discharge status: Home / Self Care | Payer: BLUE CROSS/BLUE SHIELD | Source: Ambulatory Visit | Attending: Radiation Oncology | Admitting: Radiation Oncology

## 2017-01-20 DIAGNOSIS — R42 Dizziness and giddiness: Secondary | ICD-10-CM | POA: Diagnosis not present

## 2017-01-20 DIAGNOSIS — Z8042 Family history of malignant neoplasm of prostate: Secondary | ICD-10-CM | POA: Diagnosis not present

## 2017-01-20 DIAGNOSIS — Z87891 Personal history of nicotine dependence: Secondary | ICD-10-CM | POA: Diagnosis not present

## 2017-01-20 DIAGNOSIS — C801 Malignant (primary) neoplasm, unspecified: Secondary | ICD-10-CM | POA: Diagnosis not present

## 2017-01-20 DIAGNOSIS — Z79899 Other long term (current) drug therapy: Secondary | ICD-10-CM | POA: Diagnosis not present

## 2017-01-20 DIAGNOSIS — C799 Secondary malignant neoplasm of unspecified site: Secondary | ICD-10-CM | POA: Diagnosis not present

## 2017-01-20 DIAGNOSIS — Z85828 Personal history of other malignant neoplasm of skin: Secondary | ICD-10-CM | POA: Diagnosis not present

## 2017-01-20 DIAGNOSIS — E7800 Pure hypercholesterolemia, unspecified: Secondary | ICD-10-CM | POA: Diagnosis not present

## 2017-01-20 DIAGNOSIS — Z510 Encounter for antineoplastic radiation therapy: Secondary | ICD-10-CM | POA: Diagnosis not present

## 2017-01-20 DIAGNOSIS — I10 Essential (primary) hypertension: Secondary | ICD-10-CM | POA: Diagnosis not present

## 2017-01-20 DIAGNOSIS — C770 Secondary and unspecified malignant neoplasm of lymph nodes of head, face and neck: Secondary | ICD-10-CM | POA: Diagnosis not present

## 2017-01-20 DIAGNOSIS — C090 Malignant neoplasm of tonsillar fossa: Secondary | ICD-10-CM | POA: Diagnosis not present

## 2017-01-21 ENCOUNTER — Ambulatory Visit
Admission: RE | Admit: 2017-01-21 | Discharge: 2017-01-21 | Disposition: A | Discharge status: Home / Self Care | Payer: BLUE CROSS/BLUE SHIELD | Source: Ambulatory Visit | Attending: Radiation Oncology | Admitting: Radiation Oncology

## 2017-01-21 ENCOUNTER — Ambulatory Visit: Payer: BLUE CROSS/BLUE SHIELD

## 2017-01-21 DIAGNOSIS — E7800 Pure hypercholesterolemia, unspecified: Secondary | ICD-10-CM | POA: Diagnosis not present

## 2017-01-21 DIAGNOSIS — I10 Essential (primary) hypertension: Secondary | ICD-10-CM | POA: Diagnosis not present

## 2017-01-21 DIAGNOSIS — R42 Dizziness and giddiness: Secondary | ICD-10-CM | POA: Diagnosis not present

## 2017-01-21 DIAGNOSIS — I739 Peripheral vascular disease, unspecified: Secondary | ICD-10-CM | POA: Diagnosis not present

## 2017-01-21 DIAGNOSIS — Z85828 Personal history of other malignant neoplasm of skin: Secondary | ICD-10-CM | POA: Diagnosis not present

## 2017-01-21 DIAGNOSIS — R0989 Other specified symptoms and signs involving the circulatory and respiratory systems: Secondary | ICD-10-CM | POA: Diagnosis not present

## 2017-01-21 DIAGNOSIS — C770 Secondary and unspecified malignant neoplasm of lymph nodes of head, face and neck: Secondary | ICD-10-CM | POA: Diagnosis not present

## 2017-01-21 DIAGNOSIS — Z8042 Family history of malignant neoplasm of prostate: Secondary | ICD-10-CM | POA: Diagnosis not present

## 2017-01-21 DIAGNOSIS — Z87891 Personal history of nicotine dependence: Secondary | ICD-10-CM | POA: Diagnosis not present

## 2017-01-21 DIAGNOSIS — Z79899 Other long term (current) drug therapy: Secondary | ICD-10-CM | POA: Diagnosis not present

## 2017-01-21 DIAGNOSIS — I7410 Embolism and thrombosis of unspecified parts of aorta: Secondary | ICD-10-CM | POA: Diagnosis not present

## 2017-01-21 DIAGNOSIS — C090 Malignant neoplasm of tonsillar fossa: Secondary | ICD-10-CM | POA: Diagnosis not present

## 2017-01-21 DIAGNOSIS — Z510 Encounter for antineoplastic radiation therapy: Secondary | ICD-10-CM | POA: Diagnosis not present

## 2017-01-22 ENCOUNTER — Ambulatory Visit: Payer: BLUE CROSS/BLUE SHIELD

## 2017-01-22 ENCOUNTER — Ambulatory Visit
Admission: RE | Admit: 2017-01-22 | Discharge: 2017-01-22 | Disposition: A | Discharge status: Home / Self Care | Payer: BLUE CROSS/BLUE SHIELD | Source: Ambulatory Visit | Attending: Radiation Oncology | Admitting: Radiation Oncology

## 2017-01-22 DIAGNOSIS — I7409 Other arterial embolism and thrombosis of abdominal aorta: Secondary | ICD-10-CM | POA: Diagnosis not present

## 2017-01-22 DIAGNOSIS — Z79899 Other long term (current) drug therapy: Secondary | ICD-10-CM | POA: Diagnosis not present

## 2017-01-22 DIAGNOSIS — E7800 Pure hypercholesterolemia, unspecified: Secondary | ICD-10-CM | POA: Diagnosis not present

## 2017-01-22 DIAGNOSIS — Z87891 Personal history of nicotine dependence: Secondary | ICD-10-CM | POA: Diagnosis not present

## 2017-01-22 DIAGNOSIS — Z7901 Long term (current) use of anticoagulants: Secondary | ICD-10-CM | POA: Diagnosis not present

## 2017-01-22 DIAGNOSIS — K219 Gastro-esophageal reflux disease without esophagitis: Secondary | ICD-10-CM | POA: Diagnosis not present

## 2017-01-22 DIAGNOSIS — Z510 Encounter for antineoplastic radiation therapy: Secondary | ICD-10-CM | POA: Diagnosis not present

## 2017-01-22 DIAGNOSIS — Z85828 Personal history of other malignant neoplasm of skin: Secondary | ICD-10-CM | POA: Diagnosis not present

## 2017-01-22 DIAGNOSIS — C099 Malignant neoplasm of tonsil, unspecified: Secondary | ICD-10-CM | POA: Diagnosis not present

## 2017-01-22 DIAGNOSIS — C090 Malignant neoplasm of tonsillar fossa: Secondary | ICD-10-CM | POA: Diagnosis not present

## 2017-01-22 DIAGNOSIS — C770 Secondary and unspecified malignant neoplasm of lymph nodes of head, face and neck: Secondary | ICD-10-CM | POA: Diagnosis not present

## 2017-01-22 DIAGNOSIS — R42 Dizziness and giddiness: Secondary | ICD-10-CM | POA: Diagnosis not present

## 2017-01-22 DIAGNOSIS — Z8042 Family history of malignant neoplasm of prostate: Secondary | ICD-10-CM | POA: Diagnosis not present

## 2017-01-22 DIAGNOSIS — I10 Essential (primary) hypertension: Secondary | ICD-10-CM | POA: Diagnosis not present

## 2017-01-22 DIAGNOSIS — Z48812 Encounter for surgical aftercare following surgery on the circulatory system: Secondary | ICD-10-CM | POA: Diagnosis not present

## 2017-01-25 ENCOUNTER — Ambulatory Visit
Admission: RE | Admit: 2017-01-25 | Discharge: 2017-01-25 | Disposition: A | Discharge status: Home / Self Care | Payer: BLUE CROSS/BLUE SHIELD | Source: Ambulatory Visit | Attending: Radiation Oncology | Admitting: Radiation Oncology

## 2017-01-25 ENCOUNTER — Ambulatory Visit: Payer: BLUE CROSS/BLUE SHIELD

## 2017-01-25 ENCOUNTER — Inpatient Hospital Stay: Payer: BLUE CROSS/BLUE SHIELD | Attending: Oncology

## 2017-01-25 DIAGNOSIS — E7800 Pure hypercholesterolemia, unspecified: Secondary | ICD-10-CM | POA: Diagnosis not present

## 2017-01-25 DIAGNOSIS — Z79899 Other long term (current) drug therapy: Secondary | ICD-10-CM | POA: Diagnosis not present

## 2017-01-25 DIAGNOSIS — Z510 Encounter for antineoplastic radiation therapy: Secondary | ICD-10-CM | POA: Diagnosis not present

## 2017-01-25 DIAGNOSIS — C770 Secondary and unspecified malignant neoplasm of lymph nodes of head, face and neck: Secondary | ICD-10-CM | POA: Diagnosis not present

## 2017-01-25 DIAGNOSIS — Z8042 Family history of malignant neoplasm of prostate: Secondary | ICD-10-CM | POA: Diagnosis not present

## 2017-01-25 DIAGNOSIS — Z85828 Personal history of other malignant neoplasm of skin: Secondary | ICD-10-CM | POA: Diagnosis not present

## 2017-01-25 DIAGNOSIS — R42 Dizziness and giddiness: Secondary | ICD-10-CM | POA: Diagnosis not present

## 2017-01-25 DIAGNOSIS — Z87891 Personal history of nicotine dependence: Secondary | ICD-10-CM | POA: Diagnosis not present

## 2017-01-25 DIAGNOSIS — C090 Malignant neoplasm of tonsillar fossa: Secondary | ICD-10-CM | POA: Diagnosis not present

## 2017-01-25 DIAGNOSIS — I10 Essential (primary) hypertension: Secondary | ICD-10-CM | POA: Diagnosis not present

## 2017-01-25 NOTE — Progress Notes (Signed)
Nutrition Follow-up:  Nutrition follow-up completed prior to patient receiving radiation therapy.  Patient in wheelchair with daughter for appointment.    Patient with recent admission to [facility] (May 23rd - 30th) for aortoiliac occlusion s/p thromboembolectomy and fem-fem bypass.  Patient reports that during hospital admission he had no appetite and was not able to eat hospital foods (7 days). Daughter reports he was just mainly living off IV fluids.  Daughter reports once home he was drinking ensure enlive and similar shakes but not much food.  Recently has increased solid foods. Reports yesterday ate cheerios, with banana, whole chocolate milk, yogurt for am snack, for lunch had hamburger and tater tot casserole and for dinner ate [place]isbury steak with mashed taters.  Daughter reports that he has been eating chicken and dumplings, ice cream, ensure shakes (at least 350 calories or more)  Patient reports some mouth pain/sore throat, no trouble swallowing.   Patient continuing with radiation treatment at this time.    Medications: reviewed   Labs: glucose 113, BUN 32, creatinine 1.09  Anthropometrics:   Weight measured today in clinic and 141 lb 6 oz decreased from initial weight of 160 lb on 4/26.  12% weight loss in 1 month and half   Estimated Energy Needs  Kcals: 2100-2500 calories/d Protein: 88-110 g/d Fluid: 2.5 L/d  NUTRITION DIAGNOSIS: Inadequate oral intake continues   MALNUTRITION DIAGNOSIS: Patient meets criteria for severe malnutrition in acute illness as evidenced by hospital admission, 12% weight loss in 1 month and half and eating < 50% energy needs for > or equal to 5 days.    INTERVENTION:   Discussed importance of high calorie, high protein foods due to hospital admission and continuing with radiation treatment.  Daughter and patient verbalized understanding.   Encouraged small frequent meals of high calorie and high protein foods.  Discussed options and ways to  meet caloric needs. Samples of supplements provided and coupons given along with recipes.  If patient unable to increase weight and nutrition after hospital admission would benefit from nutrition support (enteral) with continued therapy planned.      MONITORING, EVALUATION, GOAL: Patient will consume adequate calories and protein to prevent weight loss   NEXT VISIT: June 25th after radiation  Jazzmine Kleiman B. Zenia Resides, [place], [place] Registered Dietitian [phone] (pager)

## 2017-01-26 ENCOUNTER — Ambulatory Visit: Payer: BLUE CROSS/BLUE SHIELD

## 2017-01-26 ENCOUNTER — Ambulatory Visit
Admission: RE | Admit: 2017-01-26 | Discharge: 2017-01-26 | Disposition: A | Discharge status: Home / Self Care | Payer: BLUE CROSS/BLUE SHIELD | Source: Ambulatory Visit | Attending: Radiation Oncology | Admitting: Radiation Oncology

## 2017-01-26 ENCOUNTER — Other Ambulatory Visit: Payer: Self-pay | Admitting: *Deleted

## 2017-01-26 DIAGNOSIS — Z510 Encounter for antineoplastic radiation therapy: Secondary | ICD-10-CM | POA: Diagnosis not present

## 2017-01-26 DIAGNOSIS — I10 Essential (primary) hypertension: Secondary | ICD-10-CM | POA: Diagnosis not present

## 2017-01-26 DIAGNOSIS — Z79899 Other long term (current) drug therapy: Secondary | ICD-10-CM | POA: Diagnosis not present

## 2017-01-26 DIAGNOSIS — Z87891 Personal history of nicotine dependence: Secondary | ICD-10-CM | POA: Diagnosis not present

## 2017-01-26 DIAGNOSIS — Z8042 Family history of malignant neoplasm of prostate: Secondary | ICD-10-CM | POA: Diagnosis not present

## 2017-01-26 DIAGNOSIS — C099 Malignant neoplasm of tonsil, unspecified: Secondary | ICD-10-CM | POA: Diagnosis not present

## 2017-01-26 DIAGNOSIS — E7800 Pure hypercholesterolemia, unspecified: Secondary | ICD-10-CM | POA: Diagnosis not present

## 2017-01-26 DIAGNOSIS — Z7901 Long term (current) use of anticoagulants: Secondary | ICD-10-CM | POA: Diagnosis not present

## 2017-01-26 DIAGNOSIS — C090 Malignant neoplasm of tonsillar fossa: Secondary | ICD-10-CM | POA: Diagnosis not present

## 2017-01-26 DIAGNOSIS — C770 Secondary and unspecified malignant neoplasm of lymph nodes of head, face and neck: Secondary | ICD-10-CM | POA: Diagnosis not present

## 2017-01-26 DIAGNOSIS — Z48812 Encounter for surgical aftercare following surgery on the circulatory system: Secondary | ICD-10-CM | POA: Diagnosis not present

## 2017-01-26 DIAGNOSIS — Z85828 Personal history of other malignant neoplasm of skin: Secondary | ICD-10-CM | POA: Diagnosis not present

## 2017-01-26 DIAGNOSIS — I7409 Other arterial embolism and thrombosis of abdominal aorta: Secondary | ICD-10-CM | POA: Diagnosis not present

## 2017-01-26 DIAGNOSIS — K219 Gastro-esophageal reflux disease without esophagitis: Secondary | ICD-10-CM | POA: Diagnosis not present

## 2017-01-26 DIAGNOSIS — R42 Dizziness and giddiness: Secondary | ICD-10-CM | POA: Diagnosis not present

## 2017-01-27 ENCOUNTER — Ambulatory Visit: Payer: BLUE CROSS/BLUE SHIELD

## 2017-01-27 ENCOUNTER — Ambulatory Visit
Admission: RE | Admit: 2017-01-27 | Discharge: 2017-01-27 | Disposition: A | Discharge status: Home / Self Care | Payer: BLUE CROSS/BLUE SHIELD | Source: Ambulatory Visit | Attending: Radiation Oncology | Admitting: Radiation Oncology

## 2017-01-27 DIAGNOSIS — Z85828 Personal history of other malignant neoplasm of skin: Secondary | ICD-10-CM | POA: Diagnosis not present

## 2017-01-27 DIAGNOSIS — C770 Secondary and unspecified malignant neoplasm of lymph nodes of head, face and neck: Secondary | ICD-10-CM | POA: Diagnosis not present

## 2017-01-27 DIAGNOSIS — I10 Essential (primary) hypertension: Secondary | ICD-10-CM | POA: Diagnosis not present

## 2017-01-27 DIAGNOSIS — Z510 Encounter for antineoplastic radiation therapy: Secondary | ICD-10-CM | POA: Diagnosis not present

## 2017-01-27 DIAGNOSIS — Z8042 Family history of malignant neoplasm of prostate: Secondary | ICD-10-CM | POA: Diagnosis not present

## 2017-01-27 DIAGNOSIS — E7800 Pure hypercholesterolemia, unspecified: Secondary | ICD-10-CM | POA: Diagnosis not present

## 2017-01-27 DIAGNOSIS — Z79899 Other long term (current) drug therapy: Secondary | ICD-10-CM | POA: Diagnosis not present

## 2017-01-27 DIAGNOSIS — C090 Malignant neoplasm of tonsillar fossa: Secondary | ICD-10-CM | POA: Diagnosis not present

## 2017-01-27 DIAGNOSIS — R42 Dizziness and giddiness: Secondary | ICD-10-CM | POA: Diagnosis not present

## 2017-01-27 DIAGNOSIS — Z87891 Personal history of nicotine dependence: Secondary | ICD-10-CM | POA: Diagnosis not present

## 2017-01-28 ENCOUNTER — Other Ambulatory Visit: Payer: Self-pay | Admitting: *Deleted

## 2017-01-28 ENCOUNTER — Ambulatory Visit: Payer: BLUE CROSS/BLUE SHIELD

## 2017-01-28 DIAGNOSIS — Z48812 Encounter for surgical aftercare following surgery on the circulatory system: Secondary | ICD-10-CM | POA: Diagnosis not present

## 2017-01-28 DIAGNOSIS — Z7901 Long term (current) use of anticoagulants: Secondary | ICD-10-CM | POA: Diagnosis not present

## 2017-01-28 DIAGNOSIS — K219 Gastro-esophageal reflux disease without esophagitis: Secondary | ICD-10-CM | POA: Diagnosis not present

## 2017-01-28 DIAGNOSIS — I10 Essential (primary) hypertension: Secondary | ICD-10-CM | POA: Diagnosis not present

## 2017-01-28 DIAGNOSIS — I7409 Other arterial embolism and thrombosis of abdominal aorta: Secondary | ICD-10-CM | POA: Diagnosis not present

## 2017-01-28 DIAGNOSIS — C099 Malignant neoplasm of tonsil, unspecified: Secondary | ICD-10-CM | POA: Diagnosis not present

## 2017-01-28 DIAGNOSIS — Z87891 Personal history of nicotine dependence: Secondary | ICD-10-CM | POA: Diagnosis not present

## 2017-01-29 ENCOUNTER — Ambulatory Visit: Payer: BLUE CROSS/BLUE SHIELD

## 2017-01-29 ENCOUNTER — Telehealth: Payer: Self-pay | Admitting: Internal Medicine

## 2017-01-29 NOTE — Telephone Encounter (Signed)
Noted  

## 2017-01-29 NOTE — Telephone Encounter (Signed)
Corena Pilgrim from [facility] [place] called and stated that pt has enrolled in the case management program with them.

## 2017-02-01 ENCOUNTER — Ambulatory Visit: Payer: BLUE CROSS/BLUE SHIELD

## 2017-02-01 ENCOUNTER — Other Ambulatory Visit: Payer: Self-pay | Admitting: *Deleted

## 2017-02-01 ENCOUNTER — Ambulatory Visit
Admission: RE | Admit: 2017-02-01 | Discharge: 2017-02-01 | Disposition: A | Discharge status: Home / Self Care | Payer: BLUE CROSS/BLUE SHIELD | Source: Ambulatory Visit | Attending: Radiation Oncology | Admitting: Radiation Oncology

## 2017-02-01 ENCOUNTER — Ambulatory Visit: Payer: BLUE CROSS/BLUE SHIELD | Admitting: Gastroenterology

## 2017-02-01 DIAGNOSIS — C770 Secondary and unspecified malignant neoplasm of lymph nodes of head, face and neck: Secondary | ICD-10-CM | POA: Diagnosis not present

## 2017-02-01 DIAGNOSIS — I10 Essential (primary) hypertension: Secondary | ICD-10-CM | POA: Diagnosis not present

## 2017-02-01 DIAGNOSIS — C090 Malignant neoplasm of tonsillar fossa: Secondary | ICD-10-CM | POA: Diagnosis not present

## 2017-02-01 DIAGNOSIS — Z8042 Family history of malignant neoplasm of prostate: Secondary | ICD-10-CM | POA: Diagnosis not present

## 2017-02-01 DIAGNOSIS — Z510 Encounter for antineoplastic radiation therapy: Secondary | ICD-10-CM | POA: Diagnosis not present

## 2017-02-01 DIAGNOSIS — R42 Dizziness and giddiness: Secondary | ICD-10-CM | POA: Diagnosis not present

## 2017-02-01 DIAGNOSIS — Z85828 Personal history of other malignant neoplasm of skin: Secondary | ICD-10-CM | POA: Diagnosis not present

## 2017-02-01 DIAGNOSIS — Z87891 Personal history of nicotine dependence: Secondary | ICD-10-CM | POA: Diagnosis not present

## 2017-02-01 DIAGNOSIS — Z79899 Other long term (current) drug therapy: Secondary | ICD-10-CM | POA: Diagnosis not present

## 2017-02-01 DIAGNOSIS — E7800 Pure hypercholesterolemia, unspecified: Secondary | ICD-10-CM | POA: Diagnosis not present

## 2017-02-02 ENCOUNTER — Ambulatory Visit: Payer: BLUE CROSS/BLUE SHIELD

## 2017-02-02 ENCOUNTER — Inpatient Hospital Stay: Payer: BLUE CROSS/BLUE SHIELD | Admitting: Oncology

## 2017-02-02 ENCOUNTER — Ambulatory Visit: Payer: BLUE CROSS/BLUE SHIELD | Admitting: Gastroenterology

## 2017-02-02 ENCOUNTER — Inpatient Hospital Stay: Payer: BLUE CROSS/BLUE SHIELD

## 2017-02-03 ENCOUNTER — Ambulatory Visit: Payer: BLUE CROSS/BLUE SHIELD

## 2017-02-03 DIAGNOSIS — C099 Malignant neoplasm of tonsil, unspecified: Secondary | ICD-10-CM | POA: Diagnosis not present

## 2017-02-03 DIAGNOSIS — K219 Gastro-esophageal reflux disease without esophagitis: Secondary | ICD-10-CM | POA: Diagnosis not present

## 2017-02-03 DIAGNOSIS — Z7901 Long term (current) use of anticoagulants: Secondary | ICD-10-CM | POA: Diagnosis not present

## 2017-02-03 DIAGNOSIS — I10 Essential (primary) hypertension: Secondary | ICD-10-CM | POA: Diagnosis not present

## 2017-02-03 DIAGNOSIS — Z87891 Personal history of nicotine dependence: Secondary | ICD-10-CM | POA: Diagnosis not present

## 2017-02-03 DIAGNOSIS — Z48812 Encounter for surgical aftercare following surgery on the circulatory system: Secondary | ICD-10-CM | POA: Diagnosis not present

## 2017-02-03 DIAGNOSIS — I7409 Other arterial embolism and thrombosis of abdominal aorta: Secondary | ICD-10-CM | POA: Diagnosis not present

## 2017-02-04 ENCOUNTER — Ambulatory Visit: Payer: BLUE CROSS/BLUE SHIELD

## 2017-02-05 ENCOUNTER — Ambulatory Visit: Payer: BLUE CROSS/BLUE SHIELD

## 2017-02-05 ENCOUNTER — Ambulatory Visit: Payer: BLUE CROSS/BLUE SHIELD | Admitting: Cardiovascular Disease

## 2017-02-06 ENCOUNTER — Ambulatory Visit: Payer: BLUE CROSS/BLUE SHIELD

## 2017-02-08 ENCOUNTER — Ambulatory Visit
Admission: RE | Admit: 2017-02-08 | Discharge: 2017-02-08 | Disposition: A | Discharge status: Home / Self Care | Payer: BLUE CROSS/BLUE SHIELD | Source: Ambulatory Visit | Attending: Radiation Oncology | Admitting: Radiation Oncology

## 2017-02-08 ENCOUNTER — Inpatient Hospital Stay: Payer: BLUE CROSS/BLUE SHIELD

## 2017-02-08 DIAGNOSIS — Z8042 Family history of malignant neoplasm of prostate: Secondary | ICD-10-CM | POA: Diagnosis not present

## 2017-02-08 DIAGNOSIS — C770 Secondary and unspecified malignant neoplasm of lymph nodes of head, face and neck: Secondary | ICD-10-CM | POA: Diagnosis not present

## 2017-02-08 DIAGNOSIS — R42 Dizziness and giddiness: Secondary | ICD-10-CM | POA: Diagnosis not present

## 2017-02-08 DIAGNOSIS — Z87891 Personal history of nicotine dependence: Secondary | ICD-10-CM | POA: Diagnosis not present

## 2017-02-08 DIAGNOSIS — C090 Malignant neoplasm of tonsillar fossa: Secondary | ICD-10-CM | POA: Diagnosis not present

## 2017-02-08 DIAGNOSIS — Z79899 Other long term (current) drug therapy: Secondary | ICD-10-CM | POA: Diagnosis not present

## 2017-02-08 DIAGNOSIS — I10 Essential (primary) hypertension: Secondary | ICD-10-CM | POA: Diagnosis not present

## 2017-02-08 DIAGNOSIS — Z85828 Personal history of other malignant neoplasm of skin: Secondary | ICD-10-CM | POA: Diagnosis not present

## 2017-02-08 DIAGNOSIS — E7800 Pure hypercholesterolemia, unspecified: Secondary | ICD-10-CM | POA: Diagnosis not present

## 2017-02-08 DIAGNOSIS — Z510 Encounter for antineoplastic radiation therapy: Secondary | ICD-10-CM | POA: Diagnosis not present

## 2017-02-08 NOTE — Progress Notes (Signed)
Nutrition Follow-up:  Met with patient and daughter today for nutrition follow-up after radiation treatment.  Patient reports only received 1 day of radiation last week and had to take some time off due to painful swallowing from radiation.  Reports swallowing is better and able to take in more in the last 3-4 days.  Daughter has been using weight gainer powder (1250 calories and 50 gm of protein in 2 scoops). Daughter has been mixing this in with ice cream, whole milk, cream and blending it shake.  Patient has been able to eat creamy soups (made with whole milk), cheerios and milk, yogurt, ice creams mostly.     Medications: reviewed  Labs: reviewed  Anthropometrics:   Weight today 141 lb stable from 6/11 weight of 141 lb 6 oz.  Patient reports that at radiation last week weighed 139 lb.     Estimated Energy Needs  Kcals: 2100-2500 calories/d Protein: 88-110 g/d Fluid: 2.5 L/d  NUTRITION DIAGNOSIS: Inadequate oral intake continues   MALNUTRITION DIAGNOSIS: Severe malnutrition continues   INTERVENTION:   Encouraged patient to continue to consume high calorie, high protein supplements and foods.  Does not like really like the taste of ensure/boost products but prefers the weight gainer shake.   To maximize nutrition, patient could benefit from feeding tube placement due to poor po intake, treatment delays, significant weight loss.  Recommendations previously sent to Dr. Janese Banks.      MONITORING, EVALUATION, GOAL: Patient will consume adequate calories and protein to prevent weight loss.    NEXT VISIT: Monday, July 9th after radiation  Sinda Leedom B. Zenia Resides, [place], [place] Registered Dietitian [phone] (pager)

## 2017-02-09 ENCOUNTER — Telehealth: Payer: Self-pay | Admitting: *Deleted

## 2017-02-09 ENCOUNTER — Ambulatory Visit
Admission: RE | Admit: 2017-02-09 | Discharge: 2017-02-09 | Disposition: A | Discharge status: Home / Self Care | Payer: BLUE CROSS/BLUE SHIELD | Source: Ambulatory Visit | Attending: Radiation Oncology | Admitting: Radiation Oncology

## 2017-02-09 DIAGNOSIS — C770 Secondary and unspecified malignant neoplasm of lymph nodes of head, face and neck: Secondary | ICD-10-CM | POA: Diagnosis not present

## 2017-02-09 DIAGNOSIS — Z510 Encounter for antineoplastic radiation therapy: Secondary | ICD-10-CM | POA: Diagnosis not present

## 2017-02-09 DIAGNOSIS — R42 Dizziness and giddiness: Secondary | ICD-10-CM | POA: Diagnosis not present

## 2017-02-09 DIAGNOSIS — Z85828 Personal history of other malignant neoplasm of skin: Secondary | ICD-10-CM | POA: Diagnosis not present

## 2017-02-09 DIAGNOSIS — C090 Malignant neoplasm of tonsillar fossa: Secondary | ICD-10-CM | POA: Diagnosis not present

## 2017-02-09 DIAGNOSIS — Z8042 Family history of malignant neoplasm of prostate: Secondary | ICD-10-CM | POA: Diagnosis not present

## 2017-02-09 DIAGNOSIS — Z79899 Other long term (current) drug therapy: Secondary | ICD-10-CM | POA: Diagnosis not present

## 2017-02-09 DIAGNOSIS — Z87891 Personal history of nicotine dependence: Secondary | ICD-10-CM | POA: Diagnosis not present

## 2017-02-09 DIAGNOSIS — E7800 Pure hypercholesterolemia, unspecified: Secondary | ICD-10-CM | POA: Diagnosis not present

## 2017-02-09 DIAGNOSIS — I10 Essential (primary) hypertension: Secondary | ICD-10-CM | POA: Diagnosis not present

## 2017-02-09 NOTE — Telephone Encounter (Signed)
Called and spoke to wife.  I had told her that I spoke with pt about 10-12 days ago and I had put him on to see dr Janese Banks and he said he would come if he was back on radiation by then but he did not restart that date so I was calling back to work out an appt. I see he started back on Monday 6/25. I offered to set him up for this afternoon after radiation.  Wife states that she is having a hard time getting him to see dr. Janese Banks . He feels like that maybe she did not listen to me as good as she could have.  He felt like he did not get taken care of in ER when he could not move his lower ext and was in such bad pain.  He also feels like he is doing good just with radiation. He does not feel the nodule anymore.  I told her that I can see both sides because he had generalized sx. And it sounds like the sx went to the worse so quickly and it was a chronic condition that he ended up having surgery to repair. It was a horrific event for pt to go through and I had told him that when I spoke with him before and I told the wife and I told pt to that he needs to meet with Janese Banks and tell his feelings from his side and Dr Janese Banks needs to tell him what his cancer is like not continuing chemo so they know each other side of view and can make decisions from all info involved.  Wife feels like he needs to hear what the chances are without having chemo throughout the radiaition.  I went ahead and put pt on for 7/12 3:15 because wife said he did not want to see md until the end of radiation. He only wants to take radiation only right now. I told wife that if he changes his mind or if this appt does not work to please call me and she will.

## 2017-02-10 ENCOUNTER — Ambulatory Visit
Admission: RE | Admit: 2017-02-10 | Discharge: 2017-02-10 | Disposition: A | Discharge status: Home / Self Care | Payer: BLUE CROSS/BLUE SHIELD | Source: Ambulatory Visit | Attending: Radiation Oncology | Admitting: Radiation Oncology

## 2017-02-10 DIAGNOSIS — Z510 Encounter for antineoplastic radiation therapy: Secondary | ICD-10-CM | POA: Diagnosis not present

## 2017-02-10 DIAGNOSIS — Z85828 Personal history of other malignant neoplasm of skin: Secondary | ICD-10-CM | POA: Diagnosis not present

## 2017-02-10 DIAGNOSIS — E7800 Pure hypercholesterolemia, unspecified: Secondary | ICD-10-CM | POA: Diagnosis not present

## 2017-02-10 DIAGNOSIS — Z79899 Other long term (current) drug therapy: Secondary | ICD-10-CM | POA: Diagnosis not present

## 2017-02-10 DIAGNOSIS — Z8042 Family history of malignant neoplasm of prostate: Secondary | ICD-10-CM | POA: Diagnosis not present

## 2017-02-10 DIAGNOSIS — C090 Malignant neoplasm of tonsillar fossa: Secondary | ICD-10-CM | POA: Diagnosis not present

## 2017-02-10 DIAGNOSIS — C770 Secondary and unspecified malignant neoplasm of lymph nodes of head, face and neck: Secondary | ICD-10-CM | POA: Diagnosis not present

## 2017-02-10 DIAGNOSIS — R42 Dizziness and giddiness: Secondary | ICD-10-CM | POA: Diagnosis not present

## 2017-02-10 DIAGNOSIS — Z87891 Personal history of nicotine dependence: Secondary | ICD-10-CM | POA: Diagnosis not present

## 2017-02-10 DIAGNOSIS — I10 Essential (primary) hypertension: Secondary | ICD-10-CM | POA: Diagnosis not present

## 2017-02-11 ENCOUNTER — Ambulatory Visit
Admission: RE | Admit: 2017-02-11 | Discharge: 2017-02-11 | Disposition: A | Discharge status: Home / Self Care | Payer: BLUE CROSS/BLUE SHIELD | Source: Ambulatory Visit | Attending: Radiation Oncology | Admitting: Radiation Oncology

## 2017-02-11 ENCOUNTER — Ambulatory Visit: Payer: BLUE CROSS/BLUE SHIELD

## 2017-02-11 DIAGNOSIS — Z8042 Family history of malignant neoplasm of prostate: Secondary | ICD-10-CM | POA: Diagnosis not present

## 2017-02-11 DIAGNOSIS — Z85828 Personal history of other malignant neoplasm of skin: Secondary | ICD-10-CM | POA: Diagnosis not present

## 2017-02-11 DIAGNOSIS — I10 Essential (primary) hypertension: Secondary | ICD-10-CM | POA: Diagnosis not present

## 2017-02-11 DIAGNOSIS — R42 Dizziness and giddiness: Secondary | ICD-10-CM | POA: Diagnosis not present

## 2017-02-11 DIAGNOSIS — Z79899 Other long term (current) drug therapy: Secondary | ICD-10-CM | POA: Diagnosis not present

## 2017-02-11 DIAGNOSIS — Z510 Encounter for antineoplastic radiation therapy: Secondary | ICD-10-CM | POA: Diagnosis not present

## 2017-02-11 DIAGNOSIS — Z87891 Personal history of nicotine dependence: Secondary | ICD-10-CM | POA: Diagnosis not present

## 2017-02-11 DIAGNOSIS — C090 Malignant neoplasm of tonsillar fossa: Secondary | ICD-10-CM | POA: Diagnosis not present

## 2017-02-11 DIAGNOSIS — E7800 Pure hypercholesterolemia, unspecified: Secondary | ICD-10-CM | POA: Diagnosis not present

## 2017-02-11 DIAGNOSIS — C770 Secondary and unspecified malignant neoplasm of lymph nodes of head, face and neck: Secondary | ICD-10-CM | POA: Diagnosis not present

## 2017-02-12 ENCOUNTER — Ambulatory Visit: Payer: BLUE CROSS/BLUE SHIELD

## 2017-02-12 ENCOUNTER — Ambulatory Visit
Admission: RE | Admit: 2017-02-12 | Discharge: 2017-02-12 | Disposition: A | Discharge status: Home / Self Care | Payer: BLUE CROSS/BLUE SHIELD | Source: Ambulatory Visit | Attending: Radiation Oncology | Admitting: Radiation Oncology

## 2017-02-12 DIAGNOSIS — Z79899 Other long term (current) drug therapy: Secondary | ICD-10-CM | POA: Diagnosis not present

## 2017-02-12 DIAGNOSIS — R42 Dizziness and giddiness: Secondary | ICD-10-CM | POA: Diagnosis not present

## 2017-02-12 DIAGNOSIS — Z85828 Personal history of other malignant neoplasm of skin: Secondary | ICD-10-CM | POA: Diagnosis not present

## 2017-02-12 DIAGNOSIS — Z8042 Family history of malignant neoplasm of prostate: Secondary | ICD-10-CM | POA: Diagnosis not present

## 2017-02-12 DIAGNOSIS — Z510 Encounter for antineoplastic radiation therapy: Secondary | ICD-10-CM | POA: Diagnosis not present

## 2017-02-12 DIAGNOSIS — I10 Essential (primary) hypertension: Secondary | ICD-10-CM | POA: Diagnosis not present

## 2017-02-12 DIAGNOSIS — C090 Malignant neoplasm of tonsillar fossa: Secondary | ICD-10-CM | POA: Diagnosis not present

## 2017-02-12 DIAGNOSIS — Z87891 Personal history of nicotine dependence: Secondary | ICD-10-CM | POA: Diagnosis not present

## 2017-02-12 DIAGNOSIS — C770 Secondary and unspecified malignant neoplasm of lymph nodes of head, face and neck: Secondary | ICD-10-CM | POA: Diagnosis not present

## 2017-02-12 DIAGNOSIS — E7800 Pure hypercholesterolemia, unspecified: Secondary | ICD-10-CM | POA: Diagnosis not present

## 2017-02-15 ENCOUNTER — Ambulatory Visit
Admission: RE | Admit: 2017-02-15 | Discharge: 2017-02-15 | Disposition: A | Discharge status: Home / Self Care | Payer: BLUE CROSS/BLUE SHIELD | Source: Ambulatory Visit | Attending: Radiation Oncology | Admitting: Radiation Oncology

## 2017-02-15 ENCOUNTER — Encounter: Payer: Self-pay | Admitting: Unknown Physician Specialty

## 2017-02-15 ENCOUNTER — Ambulatory Visit: Payer: BLUE CROSS/BLUE SHIELD

## 2017-02-15 DIAGNOSIS — I10 Essential (primary) hypertension: Secondary | ICD-10-CM | POA: Diagnosis not present

## 2017-02-15 DIAGNOSIS — Z8042 Family history of malignant neoplasm of prostate: Secondary | ICD-10-CM | POA: Diagnosis not present

## 2017-02-15 DIAGNOSIS — E7800 Pure hypercholesterolemia, unspecified: Secondary | ICD-10-CM | POA: Diagnosis not present

## 2017-02-15 DIAGNOSIS — R42 Dizziness and giddiness: Secondary | ICD-10-CM | POA: Diagnosis not present

## 2017-02-15 DIAGNOSIS — C770 Secondary and unspecified malignant neoplasm of lymph nodes of head, face and neck: Secondary | ICD-10-CM | POA: Diagnosis not present

## 2017-02-15 DIAGNOSIS — Z79899 Other long term (current) drug therapy: Secondary | ICD-10-CM | POA: Diagnosis not present

## 2017-02-15 DIAGNOSIS — Z87891 Personal history of nicotine dependence: Secondary | ICD-10-CM | POA: Diagnosis not present

## 2017-02-15 DIAGNOSIS — Z85828 Personal history of other malignant neoplasm of skin: Secondary | ICD-10-CM | POA: Diagnosis not present

## 2017-02-15 DIAGNOSIS — C090 Malignant neoplasm of tonsillar fossa: Secondary | ICD-10-CM | POA: Diagnosis not present

## 2017-02-15 DIAGNOSIS — Z510 Encounter for antineoplastic radiation therapy: Secondary | ICD-10-CM | POA: Diagnosis not present

## 2017-02-16 ENCOUNTER — Ambulatory Visit: Payer: BLUE CROSS/BLUE SHIELD

## 2017-02-18 ENCOUNTER — Ambulatory Visit: Payer: BLUE CROSS/BLUE SHIELD

## 2017-02-19 ENCOUNTER — Ambulatory Visit: Payer: BLUE CROSS/BLUE SHIELD

## 2017-02-22 ENCOUNTER — Ambulatory Visit: Payer: BLUE CROSS/BLUE SHIELD

## 2017-02-22 ENCOUNTER — Ambulatory Visit
Admission: RE | Admit: 2017-02-22 | Discharge: 2017-02-22 | Disposition: A | Discharge status: Home / Self Care | Payer: BLUE CROSS/BLUE SHIELD | Source: Ambulatory Visit | Attending: Radiation Oncology | Admitting: Radiation Oncology

## 2017-02-22 ENCOUNTER — Inpatient Hospital Stay: Payer: BLUE CROSS/BLUE SHIELD | Attending: Oncology

## 2017-02-22 DIAGNOSIS — C090 Malignant neoplasm of tonsillar fossa: Secondary | ICD-10-CM | POA: Diagnosis not present

## 2017-02-22 DIAGNOSIS — H905 Unspecified sensorineural hearing loss: Secondary | ICD-10-CM | POA: Insufficient documentation

## 2017-02-22 DIAGNOSIS — I700 Atherosclerosis of aorta: Secondary | ICD-10-CM | POA: Insufficient documentation

## 2017-02-22 DIAGNOSIS — I2510 Atherosclerotic heart disease of native coronary artery without angina pectoris: Secondary | ICD-10-CM | POA: Insufficient documentation

## 2017-02-22 DIAGNOSIS — E785 Hyperlipidemia, unspecified: Secondary | ICD-10-CM | POA: Insufficient documentation

## 2017-02-22 DIAGNOSIS — I10 Essential (primary) hypertension: Secondary | ICD-10-CM | POA: Insufficient documentation

## 2017-02-22 DIAGNOSIS — G629 Polyneuropathy, unspecified: Secondary | ICD-10-CM | POA: Insufficient documentation

## 2017-02-22 DIAGNOSIS — C109 Malignant neoplasm of oropharynx, unspecified: Secondary | ICD-10-CM | POA: Insufficient documentation

## 2017-02-22 DIAGNOSIS — K219 Gastro-esophageal reflux disease without esophagitis: Secondary | ICD-10-CM | POA: Insufficient documentation

## 2017-02-22 DIAGNOSIS — C770 Secondary and unspecified malignant neoplasm of lymph nodes of head, face and neck: Secondary | ICD-10-CM | POA: Diagnosis not present

## 2017-02-22 DIAGNOSIS — Z79899 Other long term (current) drug therapy: Secondary | ICD-10-CM | POA: Insufficient documentation

## 2017-02-22 DIAGNOSIS — E7800 Pure hypercholesterolemia, unspecified: Secondary | ICD-10-CM | POA: Diagnosis not present

## 2017-02-22 DIAGNOSIS — Z87891 Personal history of nicotine dependence: Secondary | ICD-10-CM | POA: Insufficient documentation

## 2017-02-22 DIAGNOSIS — Z7902 Long term (current) use of antithrombotics/antiplatelets: Secondary | ICD-10-CM | POA: Insufficient documentation

## 2017-02-22 DIAGNOSIS — Z510 Encounter for antineoplastic radiation therapy: Secondary | ICD-10-CM | POA: Diagnosis not present

## 2017-02-22 DIAGNOSIS — Z86718 Personal history of other venous thrombosis and embolism: Secondary | ICD-10-CM | POA: Insufficient documentation

## 2017-02-22 DIAGNOSIS — R42 Dizziness and giddiness: Secondary | ICD-10-CM | POA: Diagnosis not present

## 2017-02-22 DIAGNOSIS — K1231 Oral mucositis (ulcerative) due to antineoplastic therapy: Secondary | ICD-10-CM | POA: Insufficient documentation

## 2017-02-22 DIAGNOSIS — Z85828 Personal history of other malignant neoplasm of skin: Secondary | ICD-10-CM | POA: Diagnosis not present

## 2017-02-22 DIAGNOSIS — Y842 Radiological procedure and radiotherapy as the cause of abnormal reaction of the patient, or of later complication, without mention of misadventure at the time of the procedure: Secondary | ICD-10-CM | POA: Insufficient documentation

## 2017-02-22 DIAGNOSIS — Z9221 Personal history of antineoplastic chemotherapy: Secondary | ICD-10-CM | POA: Insufficient documentation

## 2017-02-22 DIAGNOSIS — C779 Secondary and unspecified malignant neoplasm of lymph node, unspecified: Secondary | ICD-10-CM | POA: Insufficient documentation

## 2017-02-22 DIAGNOSIS — Z8042 Family history of malignant neoplasm of prostate: Secondary | ICD-10-CM | POA: Diagnosis not present

## 2017-02-22 DIAGNOSIS — Z7901 Long term (current) use of anticoagulants: Secondary | ICD-10-CM | POA: Insufficient documentation

## 2017-02-22 DIAGNOSIS — Z923 Personal history of irradiation: Secondary | ICD-10-CM | POA: Insufficient documentation

## 2017-02-22 NOTE — Progress Notes (Signed)
Nutrition Follow-up:  Met with patient and daughter today after radiation treatment.  Patient reports pain with swallowing and in the back of the throat area from radiation treatment.  Reports consuming weight gainer powder (1250 calories and 50 gm of protein in 2 scoops) with ice cream, half and half blended together.  Also eating soups and cheerios with milk, yogurt.    Reports that he is moving around house with a cane, in wheelchair.     Medications: reviewed  Labs: reviewed  Anthropometrics:   Weight measured today 144 lb in clinic increased from 141 lb on 6/25.     NUTRITION DIAGNOSIS: Inadequate oral intake improving   MALNUTRITION DIAGNOSIS: Severe malnutrition continues   INTERVENTION:   Pleased with weight gain today!  Encouraged patient to continue with consuming high calorie, high protein foods to increase weight gain.   Patient reported today and has on other visits that he does not want feeding tube.      MONITORING, EVALUATION, GOAL: Patient will consume adequate calories and protein to prevent weight loss   NEXT VISIT: as needed  Timothy Garrison, [place], [place] Registered Dietitian [phone] (pager)

## 2017-02-23 ENCOUNTER — Ambulatory Visit: Payer: BLUE CROSS/BLUE SHIELD

## 2017-02-23 ENCOUNTER — Encounter: Payer: Self-pay | Admitting: Internal Medicine

## 2017-02-23 DIAGNOSIS — R42 Dizziness and giddiness: Secondary | ICD-10-CM | POA: Diagnosis not present

## 2017-02-24 ENCOUNTER — Ambulatory Visit: Payer: BLUE CROSS/BLUE SHIELD

## 2017-02-25 ENCOUNTER — Ambulatory Visit: Payer: BLUE CROSS/BLUE SHIELD

## 2017-02-25 ENCOUNTER — Telehealth: Payer: Self-pay | Admitting: *Deleted

## 2017-02-25 NOTE — Telephone Encounter (Signed)
We have been wanting to see him for a while. He did not want chemo and did not want to see me until the end of his radiation

## 2017-02-25 NOTE — Telephone Encounter (Signed)
I spoke to wife and she is willing to have pt come to [facility] and I sent message to put him on for 7/6 at 10 am

## 2017-02-25 NOTE — Telephone Encounter (Signed)
Daughter called and requested an appt with Dr Janese Banks to see where things stand right now and where things are going (does he need more treatment?) He completes XRT on 7/24 and does not have FU with you.  Please advise when you would like to see him.

## 2017-02-25 NOTE — Telephone Encounter (Signed)
I mean 7/16

## 2017-02-26 ENCOUNTER — Ambulatory Visit: Payer: BLUE CROSS/BLUE SHIELD

## 2017-02-26 ENCOUNTER — Telehealth: Payer: Self-pay | Admitting: Oncology

## 2017-02-26 NOTE — Telephone Encounter (Signed)
Patient scheduled @ [place] on 03/01/17 at 10 am as f/u  for 15 mins, per 02/25/17/Sherry/desk nurse/schd msg. Pt willing to go to [place]. Wife already knows about the appt just need to enter it in the sch. System.

## 2017-03-01 ENCOUNTER — Telehealth: Payer: Self-pay | Admitting: *Deleted

## 2017-03-01 ENCOUNTER — Inpatient Hospital Stay (HOSPITAL_BASED_OUTPATIENT_CLINIC_OR_DEPARTMENT_OTHER): Payer: BLUE CROSS/BLUE SHIELD | Admitting: Oncology

## 2017-03-01 ENCOUNTER — Encounter: Payer: Self-pay | Admitting: Oncology

## 2017-03-01 ENCOUNTER — Ambulatory Visit: Payer: BLUE CROSS/BLUE SHIELD

## 2017-03-01 ENCOUNTER — Inpatient Hospital Stay: Payer: BLUE CROSS/BLUE SHIELD

## 2017-03-01 VITALS — BP 125/80 | HR 98 | Temp 97.4°F | Resp 18 | Wt 143.1 lb

## 2017-03-01 DIAGNOSIS — K1231 Oral mucositis (ulcerative) due to antineoplastic therapy: Secondary | ICD-10-CM

## 2017-03-01 DIAGNOSIS — Z5111 Encounter for antineoplastic chemotherapy: Secondary | ICD-10-CM

## 2017-03-01 DIAGNOSIS — Z87891 Personal history of nicotine dependence: Secondary | ICD-10-CM

## 2017-03-01 DIAGNOSIS — Z7902 Long term (current) use of antithrombotics/antiplatelets: Secondary | ICD-10-CM

## 2017-03-01 DIAGNOSIS — E7800 Pure hypercholesterolemia, unspecified: Secondary | ICD-10-CM | POA: Diagnosis not present

## 2017-03-01 DIAGNOSIS — C109 Malignant neoplasm of oropharynx, unspecified: Secondary | ICD-10-CM

## 2017-03-01 DIAGNOSIS — C779 Secondary and unspecified malignant neoplasm of lymph node, unspecified: Secondary | ICD-10-CM

## 2017-03-01 DIAGNOSIS — H905 Unspecified sensorineural hearing loss: Secondary | ICD-10-CM | POA: Diagnosis not present

## 2017-03-01 DIAGNOSIS — Z923 Personal history of irradiation: Secondary | ICD-10-CM | POA: Diagnosis not present

## 2017-03-01 DIAGNOSIS — I10 Essential (primary) hypertension: Secondary | ICD-10-CM | POA: Diagnosis not present

## 2017-03-01 DIAGNOSIS — Y842 Radiological procedure and radiotherapy as the cause of abnormal reaction of the patient, or of later complication, without mention of misadventure at the time of the procedure: Secondary | ICD-10-CM | POA: Diagnosis not present

## 2017-03-01 DIAGNOSIS — Z7901 Long term (current) use of anticoagulants: Secondary | ICD-10-CM

## 2017-03-01 DIAGNOSIS — Z85828 Personal history of other malignant neoplasm of skin: Secondary | ICD-10-CM

## 2017-03-01 DIAGNOSIS — D490 Neoplasm of unspecified behavior of digestive system: Secondary | ICD-10-CM

## 2017-03-01 DIAGNOSIS — K219 Gastro-esophageal reflux disease without esophagitis: Secondary | ICD-10-CM | POA: Diagnosis not present

## 2017-03-01 DIAGNOSIS — Z9221 Personal history of antineoplastic chemotherapy: Secondary | ICD-10-CM | POA: Diagnosis not present

## 2017-03-01 DIAGNOSIS — I2510 Atherosclerotic heart disease of native coronary artery without angina pectoris: Secondary | ICD-10-CM

## 2017-03-01 DIAGNOSIS — Z86718 Personal history of other venous thrombosis and embolism: Secondary | ICD-10-CM

## 2017-03-01 DIAGNOSIS — I700 Atherosclerosis of aorta: Secondary | ICD-10-CM

## 2017-03-01 DIAGNOSIS — Z79899 Other long term (current) drug therapy: Secondary | ICD-10-CM | POA: Diagnosis not present

## 2017-03-01 DIAGNOSIS — E785 Hyperlipidemia, unspecified: Secondary | ICD-10-CM | POA: Diagnosis not present

## 2017-03-01 DIAGNOSIS — G629 Polyneuropathy, unspecified: Secondary | ICD-10-CM

## 2017-03-01 LAB — CBC WITH DIFFERENTIAL/PLATELET
BASOS ABS: 0 10*3/uL (ref 0–0.1)
BASOS PCT: 1 %
EOS ABS: 0.1 10*3/uL (ref 0–0.7)
EOS PCT: 1 %
HCT: 32.9 % — ABNORMAL LOW (ref 40.0–52.0)
Hemoglobin: 11.2 g/dL — ABNORMAL LOW (ref 13.0–18.0)
Lymphocytes Relative: 10 %
Lymphs Abs: 0.6 10*3/uL — ABNORMAL LOW (ref 1.0–3.6)
MCH: 28.7 pg (ref 26.0–34.0)
MCHC: 34.2 g/dL (ref 32.0–36.0)
MCV: 84 fL (ref 80.0–100.0)
MONO ABS: 0.7 10*3/uL (ref 0.2–1.0)
Monocytes Relative: 11 %
Neutro Abs: 4.8 10*3/uL (ref 1.4–6.5)
Neutrophils Relative %: 77 %
PLATELETS: 500 10*3/uL — AB (ref 150–440)
RBC: 3.91 MIL/uL — AB (ref 4.40–5.90)
RDW: 15.2 % — ABNORMAL HIGH (ref 11.5–14.5)
WBC: 6.2 10*3/uL (ref 3.8–10.6)

## 2017-03-01 LAB — COMPREHENSIVE METABOLIC PANEL
ALT: 17 U/L (ref 17–63)
AST: 19 U/L (ref 15–41)
Albumin: 4.1 g/dL (ref 3.5–5.0)
Alkaline Phosphatase: 75 U/L (ref 38–126)
Anion gap: 7 (ref 5–15)
BUN: 17 mg/dL (ref 6–20)
CHLORIDE: 96 mmol/L — AB (ref 101–111)
CO2: 27 mmol/L (ref 22–32)
CREATININE: 1.02 mg/dL (ref 0.61–1.24)
Calcium: 9.5 mg/dL (ref 8.9–10.3)
GFR calc Af Amer: >60 mL/min (ref >60)
Glucose, Bld: 151 mg/dL — ABNORMAL HIGH (ref 65–99)
POTASSIUM: 4.3 mmol/L (ref 3.5–5.1)
SODIUM: 130 mmol/L — AB (ref 135–145)
Total Bilirubin: 0.3 mg/dL (ref 0.3–1.2)
Total Protein: 7.7 g/dL (ref 6.5–8.1)

## 2017-03-01 NOTE — Progress Notes (Signed)
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 Con Memos in March 2018 or neck mass which has been persistent for 1 month. The mass developed suddenly and was fairly asymptomatic. Patient underwent a comprehensive ENT exam which showed asymmetric and enlarged right tonsil. 2 x 4 cm level II mass was noted on exam. CT soft tissue neck on 10/30/2016 showed necrotic level II lymph node measuring 4.7 cm x 2.7 cm. Very slight tonsillar asymmetry on the right raises the possibility of an occult right tonsillar carcinoma. Patient underwent core biopsy of the cervical lymph node which showed metastatic squamous cell carcinoma P16 positive. Patient has been referred to [place] for further management.  PET/CT on 12/07/16 showed: 1. Isolated nodal metastasis in the right level 2 station. 2. Subtle asymmetric enlargement and hypermetabolism of the right palatine tonsil . This likely corresponds to the primary lesion detailed on clinic notes. 3. No extracervical disease identified. 4. Age advanced coronary artery atherosclerosis. Recommend assessment of coronary risk factors and consideration of medical therapy. 5. Aortic atherosclerosis.  Baseline audiogram on 12/14/2016 showed mild sensorineural hearing loss in his right ear and mild-to-moderate sensorineural hearing loss in his left ear. Decreased hearing as compared to 2010  Patient received 2 weekly doses of cisplatin concurrent  with RT. Following this patient developed acute aortic thrombosis  CT abdomen angiogram on 01/07/17 showed: Impression: 1. Occlusion of the infrarenal abdominal aorta just superior to the bifurcation extending into bilateral common iliac arteries. There is reconstitution of flow in the right common iliac artery and left external iliac artery 2. Occlusion of the left popliteal artery superior the knee. Distal reconstitution of flow is seen in the posterior tibial artery with flow visualized the foot. 3. Nonvisualization of flow in the right anterior tibial artery just distal to takeoff. Right posterior tibial artery is seen to the level of the ankle. 4. There is occlusion of the left femoral profunda artery just distal to its takeoff 5. Moderate stenosis of the right popliteal artery inferior to the knee at the level of the trifurcation of the calf arteries.  He underwent EMBOLECTOMY/THROMBECTOMY AORTOILIAC ARTERY BY LEG INCISION Bilateral 01/09/2017. BYPASS GRAFT FEMORAL-FEMORAL Bilateral 01/09/2017   He resumed radiation therapy only after 2-3 weeks time and did not wish to proceed with chemotherapy  Interval history- Patient has 1 more week of radiation remaining. He states initially he tolerated his radiation well but had a bad time with treatment last time and hence radiation was halted for 1 week. He did develop severe mucositis from RT which is getting better after the break. Also feels light headed and nauseous post radiation. He does not wish to continue radiation treatment at this time. He continues to have tingling and weakness in his left leg. He uses a cane to ambulate. Feels like radiation makes his mucositis worse which affects his PO intake, energy levels worse and in turn affects his overall health and mobility  ECOG PS- 2 Pain scale- 4   Review of systems- Review of Systems  Constitutional: Positive for malaise/fatigue. Negative for chills, fever and weight loss.  HENT:  Negative for congestion, ear discharge and nosebleeds.   Eyes: Negative for blurred vision.  Respiratory: Negative for cough, hemoptysis, sputum production, shortness of breath and wheezing.   Cardiovascular: Negative for chest pain, palpitations, orthopnea and claudication.  Gastrointestinal: Positive for nausea. Negative for abdominal pain, blood in stool, constipation, diarrhea, heartburn, melena and vomiting.  Genitourinary: Negative for dysuria, flank pain, frequency, hematuria and urgency.  Musculoskeletal: Negative for back pain, joint pain and myalgias.  Skin: Negative for rash.  Neurological: Positive for tingling and weakness. Negative for dizziness, focal weakness, seizures and headaches.  Endo/Heme/Allergies: Does not bruise/bleed easily.  Psychiatric/Behavioral: Negative for depression and suicidal ideas. The patient does not have insomnia.        No Known Allergies   Past Medical History:  Diagnosis Date  . Basal cell carcinoma (BCC) of back   . Basal cell carcinoma (BCC) of nasal sidewall   . Dizziness   . GERD (gastroesophageal reflux disease)   . Hypercholesterolemia   . Hypertension    hx of HBP readings  . Metastatic squamous cell carcinoma ([place]) 2018   bx cervical lymph node     Past Surgical History:  Procedure Laterality Date  . COLONOSCOPY    . ESOPHAGOGASTRODUODENOSCOPY (EGD) WITH PROPOFOL N/A 05/24/2015   Procedure: ESOPHAGOGASTRODUODENOSCOPY (EGD) WITH PROPOFOL;  Surgeon: Manya Silvas, MD;  Location: [facility] ENDOSCOPY;  Service: Endoscopy;  Laterality: N/A;  . IR FLUORO GUIDE PORT INSERTION LEFT  12/09/2016  . SAVORY DILATION N/A 05/24/2015   Procedure: SAVORY DILATION;  Surgeon: Manya Silvas, MD;  Location: [facility] ENDOSCOPY;  Service: Endoscopy;  Laterality: N/A;    Social History   Social History  . Marital status: Married    Spouse name: N/A  . Number of children: N/A  . Years of education: N/A   Occupational History  . Not on file.    Social History Main Topics  . Smoking status: Former Smoker    Quit date: 11/11/1996  . Smokeless tobacco: Never Used  . Alcohol use 0.0 oz/week     Comment: occasionally  . Drug use: No  . Sexual activity: Yes   Other Topics Concern  . Not on file   Social History Narrative  . No narrative on file    Family History  Problem Relation Age of Onset  . Hyperlipidemia Mother   . Heart disease Mother   . Hypertension Mother   . Alzheimer's disease Mother   . Prostate cancer Father   . Stroke Father      Current Outpatient Prescriptions:  .  bisacodyl (DULCOLAX) 5 MG EC tablet, Take 1 tablet (5 mg total) by mouth daily., Disp: 30 tablet, Rfl: 1 .  DENTA 5000 PLUS 1.1 % CREA dental cream, , Disp: , Rfl: 0 .  dexamethasone (DECADRON) 4 MG tablet, Take 2 tablets by mouth once a day on the day after chemotherapy and then take 2 tablets two times a day for 2 days. Take with food., Disp: 30 tablet, Rfl: 1 .  dexlansoprazole (DEXILANT) 60 MG capsule, Take 1 capsule (60 mg total) by mouth daily., Disp: 30 capsule, Rfl: 1 .  lidocaine-prilocaine (EMLA) cream, Apply to affected area once, Disp: 30 g, Rfl: 3 .  lisinopril (PRINIVIL,ZESTRIL) 30 MG tablet, TAKE 1 TABLET (30 MG TOTAL) BY MOUTH DAILY., Disp: 90 tablet, Rfl: 3 .  LORazepam (ATIVAN) 0.5 MG tablet, Take 1 tablet (0.5 mg total) by  mouth every 6 (six) hours as needed (Nausea or vomiting)., Disp: 30 tablet, Rfl: 0 .  magic mouthwash SOLN, Take 5 mLs by mouth 4 (four) times daily as needed for mouth pain., Disp: 480 mL, Rfl: 1 .  omeprazole (PRILOSEC) 20 MG capsule, TAKE 1 CAPSULE (20 MG TOTAL) BY MOUTH 2 (TWO) TIMES DAILY. (Patient not taking: Reported on 01/06/2017), Disp: 180 capsule, Rfl: 3 .  ondansetron (ZOFRAN) 4 MG tablet, Take 1 tablet (4 mg total) by mouth every 8 (eight) hours as needed for nausea or vomiting., Disp: 20 tablet, Rfl: 0 .  ondansetron (ZOFRAN) 8 MG tablet, Take 1 tablet (8 mg total) by mouth 2 (two) times daily  as needed. Start on the third day after chemotherapy., Disp: 30 tablet, Rfl: 1 .  oxyCODONE-acetaminophen (ROXICET) 5-325 MG tablet, Take 1 tablet by mouth every 4 (four) hours as needed for severe pain., Disp: 15 tablet, Rfl: 0 .  prochlorperazine (COMPAZINE) 10 MG tablet, Take 1 tablet (10 mg total) by mouth every 6 (six) hours as needed (Nausea or vomiting)., Disp: 30 tablet, Rfl: 1 .  sucralfate (CARAFATE) 1 g tablet, Take 1 tablet (1 g total) by mouth 4 (four) times daily -  with meals and at bedtime., Disp: 120 tablet, Rfl: 1  Physical exam:  Vitals:   03/01/17 1005  BP: 125/80  Pulse: 98  Resp: 18  Temp: (!) 97.4 F (36.3 C)  TempSrc: Tympanic  Weight: 143 lb 1.3 oz (64.9 kg)   Physical Exam  Constitutional: He is oriented to person, place, and time.  Thin man sitting in a wheelchair. Appears in no acute distress  HENT:  Head: Normocephalic and atraumatic.  Mouth/Throat: Oropharynx is clear and moist.  Mild erythema noted over posterior pharyngeal wall. No ulecrations  Eyes: Pupils are equal, round, and reactive to light. EOM are normal.  Neck: Normal range of motion.  Cardiovascular: Normal rate, regular rhythm and normal heart sounds.   Pulmonary/Chest: Effort normal and breath sounds normal.  Abdominal: Soft. Bowel sounds are normal.  B/l groin incisions of bypass/ thrombectomy  Lymphadenopathy:  No palpable cervical adenopathy  Neurological: He is alert and oriented to person, place, and time.  Skin: Skin is warm and dry.     CMP Latest Ref Rng & Units 01/06/2017  Glucose 65 - 99 mg/dL 113(H)  BUN 6 - 20 mg/dL 32(H)  Creatinine 0.61 - 1.24 mg/dL 1.09  Sodium 135 - 145 mmol/L 135  Potassium 3.5 - 5.1 mmol/L 4.6  Chloride 101 - 111 mmol/L 101  CO2 22 - 32 mmol/L 26  Calcium 8.9 - 10.3 mg/dL 9.2  Total Protein 6.5 - 8.1 g/dL 6.7  Total Bilirubin 0.3 - 1.2 mg/dL 0.4  Alkaline Phos 38 - 126 U/L 66  AST 15 - 41 U/L 25  ALT 17 - 63 U/L 79(H)   CBC Latest Ref Rng &  Units 01/06/2017  WBC 3.8 - 10.6 K/uL 9.7  Hemoglobin 13.0 - 18.0 g/dL 12.9(L)  Hematocrit 40.0 - 52.0 % 37.7(L)  Platelets 150 - 440 K/uL 222      Assessment and plan- Patient is a 56 y.o. 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 Discussed that currently no data to deescalate treatment for HPV positive cancers. Ideally he would have benefitted from concurrent chemo/RT given that the LN size was more than 3 cm. Since he did not  receive chemotherapy beyond 2 weekly cycles and had interruption in his RT treatment as well, he should ideally complete his radiation treatments if at all possible. HPV positive cancers have overall excellent outcomes and whether the outcome would be affected by cutting his RT treatment short by 1 week is hard to tell and predict. We could certainly support him with IV pain and nausea meds and Iv fluids through his remaining RT.   We will need to wait for 3 months post RT to repeat PET/CT to assess his response. Imaging if done sooner cannot differentiate between residual disease and radiatoon changes. If there is residual disease after 3 months, he will need neck dissection surgery at that point.   I reassured the patient that we are here to support him with his decisions and will continue to care for him regardless of what he decides.Patient understands risks and benefits of continuing versus stopping radiation treatments and wishes to stop treatments at this time  I will check cbc, cmp today and see him back in 6 weeks  Also discussed that his acute arterial thrombosis may have been as a result of cisplatin.Theer are case reports of cisplatin causing acute aortic thromboses. In those cases pateints has received higher doses and more cycles. Whereas patient only had 2 weekly doses of 40 mg/meter square. Also patient had some heartburn  even before starting chemotherapy and it is unclear if that was an atypical initial presentation of aortic occlusion  Patient does not wish to start any pain meds at this time  Peripheral neuropathy- likely mainly due to aortic occlusion. Hold off on starting cymbalta. He is on eliquis and plavix    Total face to face encounter time for this patient visit was 45 min. >50% of the time was  spent in counseling and coordination of care.       Visit Diagnosis 1. Neoplasm of oropharynx      Dr. Randa Evens, MD, MPH [facility] Pager- [phone] 03/01/2017 12:36 PM

## 2017-03-01 NOTE — Telephone Encounter (Signed)
Called pt to let him know that his na is low 130 and Dr. Janese Banks would like him to have IVF to help. He could try to increase his oral intake he he thinks he can. He prefers IVF and we will flush his port at that time. He is agreeable and will come for fluids  At 11 am tom. In [place]

## 2017-03-01 NOTE — Telephone Encounter (Signed)
-----   Message from Sindy Guadeloupe, MD sent at 03/01/2017 12:04 PM EDT ----- We can give him 1L NS for his low Na and chloride if he ris willing. Or else he can try to increase PO intake

## 2017-03-01 NOTE — Progress Notes (Signed)
Here for follow up

## 2017-03-02 ENCOUNTER — Ambulatory Visit: Payer: BLUE CROSS/BLUE SHIELD

## 2017-03-02 ENCOUNTER — Inpatient Hospital Stay: Payer: BLUE CROSS/BLUE SHIELD

## 2017-03-02 VITALS — BP 109/67 | HR 85 | Temp 97.3°F | Resp 18

## 2017-03-02 DIAGNOSIS — C779 Secondary and unspecified malignant neoplasm of lymph node, unspecified: Secondary | ICD-10-CM | POA: Diagnosis not present

## 2017-03-02 DIAGNOSIS — Z86718 Personal history of other venous thrombosis and embolism: Secondary | ICD-10-CM | POA: Diagnosis not present

## 2017-03-02 DIAGNOSIS — G629 Polyneuropathy, unspecified: Secondary | ICD-10-CM | POA: Diagnosis not present

## 2017-03-02 DIAGNOSIS — I2510 Atherosclerotic heart disease of native coronary artery without angina pectoris: Secondary | ICD-10-CM | POA: Diagnosis not present

## 2017-03-02 DIAGNOSIS — Z85828 Personal history of other malignant neoplasm of skin: Secondary | ICD-10-CM | POA: Diagnosis not present

## 2017-03-02 DIAGNOSIS — I10 Essential (primary) hypertension: Secondary | ICD-10-CM | POA: Diagnosis not present

## 2017-03-02 DIAGNOSIS — I700 Atherosclerosis of aorta: Secondary | ICD-10-CM | POA: Diagnosis not present

## 2017-03-02 DIAGNOSIS — C109 Malignant neoplasm of oropharynx, unspecified: Secondary | ICD-10-CM | POA: Diagnosis not present

## 2017-03-02 DIAGNOSIS — Z9221 Personal history of antineoplastic chemotherapy: Secondary | ICD-10-CM | POA: Diagnosis not present

## 2017-03-02 DIAGNOSIS — E871 Hypo-osmolality and hyponatremia: Secondary | ICD-10-CM

## 2017-03-02 DIAGNOSIS — H905 Unspecified sensorineural hearing loss: Secondary | ICD-10-CM | POA: Diagnosis not present

## 2017-03-02 DIAGNOSIS — K219 Gastro-esophageal reflux disease without esophagitis: Secondary | ICD-10-CM | POA: Diagnosis not present

## 2017-03-02 DIAGNOSIS — K1231 Oral mucositis (ulcerative) due to antineoplastic therapy: Secondary | ICD-10-CM | POA: Diagnosis not present

## 2017-03-02 DIAGNOSIS — Z923 Personal history of irradiation: Secondary | ICD-10-CM | POA: Diagnosis not present

## 2017-03-02 DIAGNOSIS — E785 Hyperlipidemia, unspecified: Secondary | ICD-10-CM | POA: Diagnosis not present

## 2017-03-02 DIAGNOSIS — Z87891 Personal history of nicotine dependence: Secondary | ICD-10-CM | POA: Diagnosis not present

## 2017-03-02 DIAGNOSIS — E7800 Pure hypercholesterolemia, unspecified: Secondary | ICD-10-CM | POA: Diagnosis not present

## 2017-03-02 MED ORDER — SODIUM CHLORIDE 0.9 % IV SOLN
Freq: Once | INTRAVENOUS | Status: AC
  Administered 2017-03-02: 11:00:00 via INTRAVENOUS
  Filled 2017-03-02: qty 1000

## 2017-03-02 MED ORDER — HEPARIN SOD (PORK) LOCK FLUSH 100 UNIT/ML IV SOLN
500.0000 [IU] | Freq: Once | INTRAVENOUS | Status: AC
  Administered 2017-03-02: 500 [IU] via INTRAVENOUS

## 2017-03-03 ENCOUNTER — Ambulatory Visit: Payer: BLUE CROSS/BLUE SHIELD

## 2017-03-04 ENCOUNTER — Ambulatory Visit: Payer: BLUE CROSS/BLUE SHIELD

## 2017-03-05 ENCOUNTER — Ambulatory Visit: Payer: BLUE CROSS/BLUE SHIELD

## 2017-03-08 ENCOUNTER — Ambulatory Visit: Payer: BLUE CROSS/BLUE SHIELD

## 2017-03-09 ENCOUNTER — Ambulatory Visit: Payer: BLUE CROSS/BLUE SHIELD

## 2017-04-12 ENCOUNTER — Ambulatory Visit: Payer: BLUE CROSS/BLUE SHIELD | Admitting: Oncology

## 2017-04-12 ENCOUNTER — Other Ambulatory Visit: Payer: BLUE CROSS/BLUE SHIELD

## 2017-04-12 DIAGNOSIS — G6289 Other specified polyneuropathies: Secondary | ICD-10-CM | POA: Insufficient documentation

## 2017-04-12 DIAGNOSIS — R0989 Other specified symptoms and signs involving the circulatory and respiratory systems: Secondary | ICD-10-CM | POA: Diagnosis not present

## 2017-04-12 DIAGNOSIS — I7410 Embolism and thrombosis of unspecified parts of aorta: Secondary | ICD-10-CM | POA: Diagnosis not present

## 2017-04-12 DIAGNOSIS — I739 Peripheral vascular disease, unspecified: Secondary | ICD-10-CM | POA: Diagnosis not present

## 2017-04-13 ENCOUNTER — Ambulatory Visit: Payer: BLUE CROSS/BLUE SHIELD | Admitting: Oncology

## 2017-04-13 ENCOUNTER — Other Ambulatory Visit: Payer: BLUE CROSS/BLUE SHIELD

## 2017-04-13 DIAGNOSIS — L570 Actinic keratosis: Secondary | ICD-10-CM | POA: Diagnosis not present

## 2017-04-13 DIAGNOSIS — D0359 Melanoma in situ of other part of trunk: Secondary | ICD-10-CM | POA: Diagnosis not present

## 2017-04-13 DIAGNOSIS — D485 Neoplasm of uncertain behavior of skin: Secondary | ICD-10-CM | POA: Diagnosis not present

## 2017-04-13 DIAGNOSIS — Z85828 Personal history of other malignant neoplasm of skin: Secondary | ICD-10-CM | POA: Diagnosis not present

## 2017-04-13 DIAGNOSIS — L565 Disseminated superficial actinic porokeratosis (DSAP): Secondary | ICD-10-CM | POA: Diagnosis not present

## 2017-04-21 DIAGNOSIS — D0359 Melanoma in situ of other part of trunk: Secondary | ICD-10-CM | POA: Diagnosis not present

## 2017-04-21 DIAGNOSIS — L905 Scar conditions and fibrosis of skin: Secondary | ICD-10-CM | POA: Diagnosis not present

## 2017-04-22 ENCOUNTER — Other Ambulatory Visit: Payer: Self-pay | Admitting: *Deleted

## 2017-04-26 ENCOUNTER — Inpatient Hospital Stay: Payer: BLUE CROSS/BLUE SHIELD | Attending: Oncology | Admitting: Oncology

## 2017-04-26 ENCOUNTER — Inpatient Hospital Stay: Payer: BLUE CROSS/BLUE SHIELD

## 2017-04-26 ENCOUNTER — Ambulatory Visit: Payer: BLUE CROSS/BLUE SHIELD | Admitting: Oncology

## 2017-04-26 ENCOUNTER — Telehealth: Payer: Self-pay | Admitting: *Deleted

## 2017-04-26 ENCOUNTER — Encounter: Payer: Self-pay | Admitting: Oncology

## 2017-04-26 VITALS — BP 166/93 | HR 91 | Temp 98.1°F | Resp 18 | Ht 67.0 in | Wt 144.0 lb

## 2017-04-26 DIAGNOSIS — E7800 Pure hypercholesterolemia, unspecified: Secondary | ICD-10-CM | POA: Insufficient documentation

## 2017-04-26 DIAGNOSIS — I739 Peripheral vascular disease, unspecified: Secondary | ICD-10-CM | POA: Diagnosis not present

## 2017-04-26 DIAGNOSIS — G629 Polyneuropathy, unspecified: Secondary | ICD-10-CM | POA: Insufficient documentation

## 2017-04-26 DIAGNOSIS — R5383 Other fatigue: Secondary | ICD-10-CM | POA: Diagnosis not present

## 2017-04-26 DIAGNOSIS — D490 Neoplasm of unspecified behavior of digestive system: Secondary | ICD-10-CM

## 2017-04-26 DIAGNOSIS — I2510 Atherosclerotic heart disease of native coronary artery without angina pectoris: Secondary | ICD-10-CM | POA: Insufficient documentation

## 2017-04-26 DIAGNOSIS — I7410 Embolism and thrombosis of unspecified parts of aorta: Secondary | ICD-10-CM | POA: Insufficient documentation

## 2017-04-26 DIAGNOSIS — Z923 Personal history of irradiation: Secondary | ICD-10-CM | POA: Insufficient documentation

## 2017-04-26 DIAGNOSIS — K219 Gastro-esophageal reflux disease without esophagitis: Secondary | ICD-10-CM | POA: Diagnosis not present

## 2017-04-26 DIAGNOSIS — I10 Essential (primary) hypertension: Secondary | ICD-10-CM | POA: Diagnosis not present

## 2017-04-26 DIAGNOSIS — R682 Dry mouth, unspecified: Secondary | ICD-10-CM

## 2017-04-26 DIAGNOSIS — E871 Hypo-osmolality and hyponatremia: Secondary | ICD-10-CM | POA: Insufficient documentation

## 2017-04-26 DIAGNOSIS — Z8042 Family history of malignant neoplasm of prostate: Secondary | ICD-10-CM | POA: Insufficient documentation

## 2017-04-26 DIAGNOSIS — I700 Atherosclerosis of aorta: Secondary | ICD-10-CM | POA: Insufficient documentation

## 2017-04-26 DIAGNOSIS — Z79899 Other long term (current) drug therapy: Secondary | ICD-10-CM | POA: Diagnosis not present

## 2017-04-26 DIAGNOSIS — E878 Other disorders of electrolyte and fluid balance, not elsewhere classified: Secondary | ICD-10-CM | POA: Diagnosis not present

## 2017-04-26 DIAGNOSIS — R221 Localized swelling, mass and lump, neck: Secondary | ICD-10-CM

## 2017-04-26 DIAGNOSIS — Z7901 Long term (current) use of anticoagulants: Secondary | ICD-10-CM | POA: Diagnosis not present

## 2017-04-26 DIAGNOSIS — Z87891 Personal history of nicotine dependence: Secondary | ICD-10-CM | POA: Diagnosis not present

## 2017-04-26 DIAGNOSIS — Z85828 Personal history of other malignant neoplasm of skin: Secondary | ICD-10-CM | POA: Diagnosis not present

## 2017-04-26 DIAGNOSIS — C109 Malignant neoplasm of oropharynx, unspecified: Secondary | ICD-10-CM | POA: Diagnosis not present

## 2017-04-26 DIAGNOSIS — K117 Disturbances of salivary secretion: Secondary | ICD-10-CM | POA: Diagnosis not present

## 2017-04-26 LAB — COMPREHENSIVE METABOLIC PANEL
ALK PHOS: 66 U/L (ref 38–126)
ALT: 57 U/L (ref 17–63)
AST: 38 U/L (ref 15–41)
Albumin: 4.1 g/dL (ref 3.5–5.0)
Anion gap: 9 (ref 5–15)
BUN: 16 mg/dL (ref 6–20)
CALCIUM: 9.2 mg/dL (ref 8.9–10.3)
CHLORIDE: 100 mmol/L — AB (ref 101–111)
CO2: 23 mmol/L (ref 22–32)
CREATININE: 1.07 mg/dL (ref 0.61–1.24)
GFR calc Af Amer: >60 mL/min (ref >60)
Glucose, Bld: 103 mg/dL — ABNORMAL HIGH (ref 65–99)
Potassium: 3.9 mmol/L (ref 3.5–5.1)
Sodium: 132 mmol/L — ABNORMAL LOW (ref 135–145)
Total Bilirubin: 0.5 mg/dL (ref 0.3–1.2)
Total Protein: 7.6 g/dL (ref 6.5–8.1)

## 2017-04-26 LAB — CBC WITH DIFFERENTIAL/PLATELET
Basophils Absolute: 0 10*3/uL (ref 0–0.1)
Basophils Relative: 0 %
EOS PCT: 1 %
Eosinophils Absolute: 0.1 10*3/uL (ref 0–0.7)
HCT: 37.2 % — ABNORMAL LOW (ref 40.0–52.0)
Hemoglobin: 12.5 g/dL — ABNORMAL LOW (ref 13.0–18.0)
LYMPHS ABS: 1 10*3/uL (ref 1.0–3.6)
Lymphocytes Relative: 12 %
MCH: 27.4 pg (ref 26.0–34.0)
MCHC: 33.6 g/dL (ref 32.0–36.0)
MCV: 81.5 fL (ref 80.0–100.0)
Monocytes Absolute: 0.8 10*3/uL (ref 0.2–1.0)
Monocytes Relative: 9 %
Neutro Abs: 6.9 10*3/uL — ABNORMAL HIGH (ref 1.4–6.5)
Neutrophils Relative %: 78 %
PLATELETS: 347 10*3/uL (ref 150–440)
RBC: 4.57 MIL/uL (ref 4.40–5.90)
RDW: 14.7 % — ABNORMAL HIGH (ref 11.5–14.5)
WBC: 8.8 10*3/uL (ref 3.8–10.6)

## 2017-04-26 NOTE — Progress Notes (Signed)
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 Con Memos in March 2018 or neck mass which has been persistent for 1 month. The mass developed suddenly and was fairly asymptomatic. Patient underwent a comprehensive ENT exam which showed asymmetric and enlarged right tonsil. 2 x 4 cm level II mass was noted on exam. CT soft tissue neck on 10/30/2016 showed necrotic level II lymph node measuring 4.7 cm x 2.7 cm. Very slight tonsillar asymmetry on the right raises the possibility of an occult right tonsillar carcinoma. Patient underwent core biopsy of the cervical lymph node which showed metastatic squamous cell carcinoma P16 positive. Patient has been referred to [place] for further management.  PET/CT on 12/07/16 showed: 1. Isolated nodal metastasis in the right level 2 station. 2. Subtle asymmetric enlargement and hypermetabolism of the right palatine tonsil . This likely corresponds to the primary lesion detailed on clinic notes. 3. No extracervical disease identified. 4. Age advanced coronary artery atherosclerosis. Recommend assessment of coronary risk factors and consideration of medical therapy. 5. Aortic atherosclerosis.  Baseline audiogram on 12/14/2016 showed mild sensorineural hearing loss in his right ear and mild-to-moderate sensorineural hearing loss in his left ear. Decreased hearing as compared to 2010  Patient received 2 weekly doses of  cisplatin concurrent with RT. Following this patient developed acute aortic thrombosis  CT abdomen angiogram on 01/07/17 showed: Impression: 1. Occlusion of the infrarenal abdominal aorta just superior to the bifurcation extending into bilateral common iliac arteries. There is reconstitution of flow in the right common iliac artery and left external iliac artery 2. Occlusion of the left popliteal artery superior the knee. Distal reconstitution of flow is seen in the posterior tibial artery with flow visualized the foot. 3. Nonvisualization of flow in the right anterior tibial artery just distal to takeoff. Right posterior tibial artery is seen to the level of the ankle. 4. There is occlusion of the left femoral profunda artery just distal to its takeoff 5. Moderate stenosis of the right popliteal artery inferior to the knee at the level of the trifurcation of the calf arteries.  He underwent EMBOLECTOMY/THROMBECTOMY AORTOILIAC ARTERY BY LEG INCISION Bilateral 01/09/2017. BYPASS GRAFT FEMORAL-FEMORAL Bilateral 01/09/2017   He resumed radiation therapy only after 2-3 weeks time and did not wish to proceed with chemotherapy. He continued to do poorly in terms of fatigue, weight loss and complications of aortic occlusion. Radiation therapy was cut short by 1 week   Interval history- continues to have problems with neuropathy in his bilateral feet. He will be seeing vascular surgery to discuss about further procedures for his aorta and bypass graft  Overall he feels his energy levels, appetite is improving after he stopped his radiation. Does not have much of a taste sensation and still complains of dry mouth  ECOG PS- 1 Pain scale- 0   Review of systems- Review of Systems  Constitutional: Negative for chills, fever, malaise/fatigue and weight loss.  HENT: Negative for congestion, ear discharge and nosebleeds.  Eyes: Negative for blurred vision.  Respiratory: Negative for cough,  hemoptysis, sputum production, shortness of breath and wheezing.   Cardiovascular: Negative for chest pain, palpitations, orthopnea and claudication.  Gastrointestinal: Negative for abdominal pain, blood in stool, constipation, diarrhea, heartburn, melena, nausea and vomiting.  Genitourinary: Negative for dysuria, flank pain, frequency, hematuria and urgency.  Musculoskeletal: Negative for back pain, joint pain and myalgias.  Skin: Negative for rash.  Neurological: Positive for tingling. Negative for dizziness, focal weakness, seizures, weakness and headaches.  Endo/Heme/Allergies: Does not bruise/bleed easily.  Psychiatric/Behavioral: Negative for depression and suicidal ideas. The patient does not have insomnia.       No Known Allergies   Past Medical History:  Diagnosis Date  . Basal cell carcinoma (BCC) of back   . Basal cell carcinoma (BCC) of nasal sidewall   . Dizziness   . GERD (gastroesophageal reflux disease)   . Hypercholesterolemia   . Hypertension    hx of HBP readings  . Metastatic squamous cell carcinoma ([place]) 2018   bx cervical lymph node     Past Surgical History:  Procedure Laterality Date  . COLONOSCOPY    . ESOPHAGOGASTRODUODENOSCOPY (EGD) WITH PROPOFOL N/A 05/24/2015   Procedure: ESOPHAGOGASTRODUODENOSCOPY (EGD) WITH PROPOFOL;  Surgeon: Manya Silvas, MD;  Location: [facility] ENDOSCOPY;  Service: Endoscopy;  Laterality: N/A;  . IR FLUORO GUIDE PORT INSERTION LEFT  12/09/2016  . SAVORY DILATION N/A 05/24/2015   Procedure: SAVORY DILATION;  Surgeon: Manya Silvas, MD;  Location: [facility] ENDOSCOPY;  Service: Endoscopy;  Laterality: N/A;    Social History   Social History  . Marital status: Married    Spouse name: N/A  . Number of children: N/A  . Years of education: N/A   Occupational History  . Not on file.   Social History Main Topics  . Smoking status: Former Smoker    Quit date: 11/11/1996  . Smokeless tobacco: Never Used  . Alcohol use No      Comment: none for 6 months  . Drug use: No  . Sexual activity: Yes   Other Topics Concern  . Not on file   Social History Narrative  . No narrative on file    Family History  Problem Relation Age of Onset  . Hyperlipidemia Mother   . Heart disease Mother   . Hypertension Mother   . Alzheimer's disease Mother   . Prostate cancer Father   . Stroke Father      Current Outpatient Prescriptions:  .  ELIQUIS 5 MG TABS tablet, , Disp: , Rfl: 10 .  lidocaine-prilocaine (EMLA) cream, Apply to affected area once, Disp: 30 g, Rfl: 3 .  lisinopril (PRINIVIL,ZESTRIL) 30 MG tablet, TAKE 1 TABLET (30 MG TOTAL) BY MOUTH DAILY., Disp: 90 tablet, Rfl: 3 .  metoprolol tartrate (LOPRESSOR) 25 MG tablet, , Disp: , Rfl: 10 .  omeprazole (PRILOSEC) 20 MG capsule, TAKE 1 CAPSULE (20 MG TOTAL) BY MOUTH 2 (TWO) TIMES DAILY., Disp: 180 capsule, Rfl: 3  Physical exam:  Vitals:   04/26/17 0926  BP: (!) 166/93  Pulse: 91  Resp: 18  Temp: 98.1 F (36.7 C)  TempSrc: Tympanic  Weight: 143 lb 15.4 oz (65.3 kg)  Height: 5\' 7"  (1.702 m)   Physical Exam  Constitutional: He is oriented to person, place, and time.  Thin, alert appears in no acute distress. Ambulates with a cane  HENT:  Head: Normocephalic and atraumatic.  Eyes: Pupils are equal, round, and reactive to light. EOM are  normal.  Neck: Normal range of motion.  Cardiovascular: Normal rate, regular rhythm and normal heart sounds.   Pulmonary/Chest: Effort normal and breath sounds normal.  Abdominal: Soft. Bowel sounds are normal.  Lymphadenopathy:  No palpable cervical adenopathy  Neurological: He is alert and oriented to person, place, and time.  Skin: Skin is warm and dry.     CMP Latest Ref Rng & Units 04/26/2017  Glucose 65 - 99 mg/dL 103(H)  BUN 6 - 20 mg/dL 16  Creatinine 0.61 - 1.24 mg/dL 1.07  Sodium 135 - 145 mmol/L 132(L)  Potassium 3.5 - 5.1 mmol/L 3.9  Chloride 101 - 111 mmol/L 100(L)  CO2 22 - 32 mmol/L 23  Calcium 8.9  - 10.3 mg/dL 9.2  Total Protein 6.5 - 8.1 g/dL 7.6  Total Bilirubin 0.3 - 1.2 mg/dL 0.5  Alkaline Phos 38 - 126 U/L 66  AST 15 - 41 U/L 38  ALT 17 - 63 U/L 57   CBC Latest Ref Rng & Units 04/26/2017  WBC 3.8 - 10.6 K/uL 8.8  Hemoglobin 13.0 - 18.0 g/dL 12.5(L)  Hematocrit 40.0 - 52.0 % 37.2(L)  Platelets 150 - 440 K/uL 347     Assessment and plan- Patient is a 57 y.o. 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 We will plan to repeat PET/Ct scan 1st week of October. I will discuss his case at the tumor board thereafter and see him the following week with consensus recommendations from [place] and ENT. Clinically he is doing well and no evidence of recurrence on todays exam  Xerostomia- due to radiation treatment. He continues to work with his fluid intake, chewing gums and candies. He would like to see how he does over next month before considering artificial saliva or pilocarpine  Aortic thrombosis- being followedby [facility]. He will see them today regarding further intervention. Aortic surgery is a consideration in the future pending PET/ CT results  Mild hyponatremia/ hypochloremia- continue to monitor. Encourage PO intake   Visit Diagnosis 1. Neoplasm of oropharynx   2. Xerostomia   3. Aortic occlusion (HCC)      Dr. Randa Evens, MD, MPH [facility] Pager- [phone] 04/26/2017 10:52 AM

## 2017-04-26 NOTE — Progress Notes (Signed)
Pt still having problems with aorta clogged and needs surgery to clear vessels from clots, still having dry mouth from radiation and still not have taste buds back yet/

## 2017-04-26 NOTE — Telephone Encounter (Signed)
Pt is suppose to see md at [facility] this am to do procedure.  The first info they got said do not take any meds.  The wife called yest. And was told he should take his b/p meds.  He did not take them. And b/p elevated. Repeat check was 176/90.  I told wife she could call on the way there to see if he needs to take b/p meds.  Then wife tells me that the person she spoke to yest. Told him to take it. I told her to give b/p meds to pt. I do not think that he could have procedure with that b/p reading.  He asked about eliquis and I told him no.  Any time you have a procedure eliquis is held.  Do not take it.  It would be best if they call while they are in route to make sure about what to do. They will call.

## 2017-05-04 ENCOUNTER — Other Ambulatory Visit: Payer: Self-pay | Admitting: Internal Medicine

## 2017-05-14 ENCOUNTER — Ambulatory Visit: Payer: BLUE CROSS/BLUE SHIELD | Admitting: Cardiovascular Disease

## 2017-05-17 ENCOUNTER — Ambulatory Visit
Admission: RE | Admit: 2017-05-17 | Discharge: 2017-05-17 | Disposition: A | Discharge status: Home / Self Care | Payer: BLUE CROSS/BLUE SHIELD | Source: Ambulatory Visit | Attending: Oncology | Admitting: Oncology

## 2017-05-17 DIAGNOSIS — D490 Neoplasm of unspecified behavior of digestive system: Secondary | ICD-10-CM | POA: Diagnosis not present

## 2017-05-17 DIAGNOSIS — C760 Malignant neoplasm of head, face and neck: Secondary | ICD-10-CM | POA: Diagnosis not present

## 2017-05-17 LAB — GLUCOSE, CAPILLARY: Glucose-Capillary: 94 mg/dL (ref 65–99)

## 2017-05-17 MED ORDER — FLUDEOXYGLUCOSE F - 18 (FDG) INJECTION
12.0000 | Freq: Once | INTRAVENOUS | Status: AC | PRN
  Administered 2017-05-17: 12.75 via INTRAVENOUS

## 2017-05-24 ENCOUNTER — Telehealth: Payer: Self-pay | Admitting: *Deleted

## 2017-05-24 ENCOUNTER — Encounter: Payer: Self-pay | Admitting: Oncology

## 2017-05-24 ENCOUNTER — Inpatient Hospital Stay: Payer: BLUE CROSS/BLUE SHIELD | Attending: Oncology | Admitting: Oncology

## 2017-05-24 VITALS — BP 176/100 | HR 93 | Temp 97.7°F | Resp 16 | Wt 145.1 lb

## 2017-05-24 DIAGNOSIS — I70201 Unspecified atherosclerosis of native arteries of extremities, right leg: Secondary | ICD-10-CM | POA: Diagnosis not present

## 2017-05-24 DIAGNOSIS — K219 Gastro-esophageal reflux disease without esophagitis: Secondary | ICD-10-CM | POA: Insufficient documentation

## 2017-05-24 DIAGNOSIS — Z8042 Family history of malignant neoplasm of prostate: Secondary | ICD-10-CM

## 2017-05-24 DIAGNOSIS — Z9221 Personal history of antineoplastic chemotherapy: Secondary | ICD-10-CM | POA: Diagnosis not present

## 2017-05-24 DIAGNOSIS — I700 Atherosclerosis of aorta: Secondary | ICD-10-CM | POA: Diagnosis not present

## 2017-05-24 DIAGNOSIS — Z79899 Other long term (current) drug therapy: Secondary | ICD-10-CM | POA: Insufficient documentation

## 2017-05-24 DIAGNOSIS — Z87891 Personal history of nicotine dependence: Secondary | ICD-10-CM | POA: Diagnosis not present

## 2017-05-24 DIAGNOSIS — Z923 Personal history of irradiation: Secondary | ICD-10-CM | POA: Diagnosis not present

## 2017-05-24 DIAGNOSIS — K117 Disturbances of salivary secretion: Secondary | ICD-10-CM | POA: Diagnosis not present

## 2017-05-24 DIAGNOSIS — C770 Secondary and unspecified malignant neoplasm of lymph nodes of head, face and neck: Secondary | ICD-10-CM | POA: Diagnosis not present

## 2017-05-24 DIAGNOSIS — I7410 Embolism and thrombosis of unspecified parts of aorta: Secondary | ICD-10-CM | POA: Insufficient documentation

## 2017-05-24 DIAGNOSIS — I708 Atherosclerosis of other arteries: Secondary | ICD-10-CM | POA: Insufficient documentation

## 2017-05-24 DIAGNOSIS — I714 Abdominal aortic aneurysm, without rupture: Secondary | ICD-10-CM

## 2017-05-24 DIAGNOSIS — D490 Neoplasm of unspecified behavior of digestive system: Secondary | ICD-10-CM

## 2017-05-24 DIAGNOSIS — E7800 Pure hypercholesterolemia, unspecified: Secondary | ICD-10-CM

## 2017-05-24 DIAGNOSIS — I2510 Atherosclerotic heart disease of native coronary artery without angina pectoris: Secondary | ICD-10-CM | POA: Diagnosis not present

## 2017-05-24 DIAGNOSIS — I7409 Other arterial embolism and thrombosis of abdominal aorta: Secondary | ICD-10-CM | POA: Insufficient documentation

## 2017-05-24 DIAGNOSIS — I10 Essential (primary) hypertension: Secondary | ICD-10-CM | POA: Insufficient documentation

## 2017-05-24 DIAGNOSIS — Z85828 Personal history of other malignant neoplasm of skin: Secondary | ICD-10-CM

## 2017-05-24 DIAGNOSIS — C109 Malignant neoplasm of oropharynx, unspecified: Secondary | ICD-10-CM | POA: Diagnosis not present

## 2017-05-24 NOTE — Progress Notes (Signed)
Patient states that he is anxious about getting his PET Scan results today. He denies pain.

## 2017-05-24 NOTE — Telephone Encounter (Signed)
Called ENT and got an appt with Dr.Mcqueen 10/24 @ 9:30. I faxed records to the office and I called pt and let him know and he will be there.  The port can't be removed by IR because pt will need to be off blood thinners and I called Dr. Danne Baxter office and Hoyle Sauer called me back and said that Dr. Danne Baxter sayspt can't come off blood thinners but she can take port out just send an order to their office and fax to [phone].  Will get order and fax

## 2017-05-24 NOTE — Progress Notes (Signed)
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 Con Memos in March 2018 or neck mass which has been persistent for 1 month. The mass developed suddenly and was fairly asymptomatic. Patient underwent a comprehensive ENT exam which showed asymmetric and enlarged right tonsil. 2 x 4 cm level II mass was noted on exam. CT soft tissue neck on 10/30/2016 showed necrotic level II lymph node measuring 4.7 cm x 2.7 cm. Very slight tonsillar asymmetry on the right raises the possibility of an occult right tonsillar carcinoma. Patient underwent core biopsy of the cervical lymph node which showed metastatic squamous cell carcinoma P16 positive. Patient has been referred to [place] for further management.  PET/CT on 12/07/16 showed: 1. Isolated nodal metastasis in the right level 2 station. 2. Subtle asymmetric enlargement and hypermetabolism of the right palatine tonsil . This likely corresponds to the primary lesion detailed on clinic notes. 3. No extracervical disease identified. 4. Age advanced coronary artery atherosclerosis. Recommend assessment of coronary risk factors and consideration of medical therapy. 5. Aortic atherosclerosis.  Baseline audiogram on 12/14/2016 showed mild sensorineural hearing loss in his right ear and mild-to-moderate sensorineural hearing loss in his left ear. Decreased hearing as compared to 2010  Patient  received 2 weekly doses of cisplatin concurrent with RT. Following this patient developed acute aortic thrombosis  CT abdomen angiogram on 01/07/17 showed: Impression: 1. Occlusion of the infrarenal abdominal aorta just superior to the bifurcation extending into bilateral common iliac arteries. There is reconstitution of flow in the right common iliac artery and left external iliac artery 2. Occlusion of the left popliteal artery superior the knee. Distal reconstitution of flow is seen in the posterior tibial artery with flow visualized the foot. 3. Nonvisualization of flow in the right anterior tibial artery just distal to takeoff. Right posterior tibial artery is seen to the level of the ankle. 4. There is occlusion of the left femoral profunda artery just distal to its takeoff 5. Moderate stenosis of the right popliteal artery inferior to the knee at the level of the trifurcation of the calf arteries.  He underwent EMBOLECTOMY/THROMBECTOMY AORTOILIAC ARTERY BY LEG INCISION Bilateral 01/09/2017. BYPASS GRAFT FEMORAL-FEMORAL Bilateral 01/09/2017   He resumed radiation therapy only after 2-3 weeks time and did not wish to proceed with chemotherapy. He continued to do poorly in terms of fatigue, weight loss and complications of aortic occlusion. Radiation therapy was cut short by 1 week  Interval history- appetite is slowly improving. He is also continuing physical therapy to help with ambulation  ECOG PS- 1 Pain scale- 0   Review of systems- Review of Systems  Constitutional: Negative for chills, fever, malaise/fatigue and weight loss.  HENT: Negative for congestion, ear discharge and nosebleeds.   Eyes: Negative for blurred vision.  Respiratory: Negative for cough, hemoptysis, sputum production, shortness of breath and wheezing.   Cardiovascular: Negative for chest pain, palpitations, orthopnea and claudication.  Gastrointestinal: Negative for abdominal pain, blood in stool,  constipation, diarrhea, heartburn, melena,  nausea and vomiting.  Genitourinary: Negative for dysuria, flank pain, frequency, hematuria and urgency.  Musculoskeletal: Negative for back pain, joint pain and myalgias.  Skin: Negative for rash.  Neurological: Positive for tingling (chronci nueropathy in LLE due to vascular compromise). Negative for dizziness, focal weakness, seizures, weakness and headaches.  Endo/Heme/Allergies: Does not bruise/bleed easily.  Psychiatric/Behavioral: Negative for depression and suicidal ideas. The patient does not have insomnia.      Current treatment- observation  No Known Allergies   Past Medical History:  Diagnosis Date  . Basal cell carcinoma (BCC) of back   . Basal cell carcinoma (BCC) of nasal sidewall   . Dizziness   . GERD (gastroesophageal reflux disease)   . Hypercholesterolemia   . Hypertension    hx of HBP readings  . Metastatic squamous cell carcinoma ([place]) 2018   bx cervical lymph node     Past Surgical History:  Procedure Laterality Date  . COLONOSCOPY    . ESOPHAGOGASTRODUODENOSCOPY (EGD) WITH PROPOFOL N/A 05/24/2015   Procedure: ESOPHAGOGASTRODUODENOSCOPY (EGD) WITH PROPOFOL;  Surgeon: Manya Silvas, MD;  Location: [facility] ENDOSCOPY;  Service: Endoscopy;  Laterality: N/A;  . IR FLUORO GUIDE PORT INSERTION LEFT  12/09/2016  . SAVORY DILATION N/A 05/24/2015   Procedure: SAVORY DILATION;  Surgeon: Manya Silvas, MD;  Location: [facility] ENDOSCOPY;  Service: Endoscopy;  Laterality: N/A;    Social History   Social History  . Marital status: Married    Spouse name: N/A  . Number of children: N/A  . Years of education: N/A   Occupational History  . Not on file.   Social History Main Topics  . Smoking status: Former Smoker    Quit date: 11/11/1996  . Smokeless tobacco: Never Used  . Alcohol use No     Comment: none for 6 months  . Drug use: No  . Sexual activity: Yes   Other Topics Concern  . Not on file   Social History  Narrative  . No narrative on file    Family History  Problem Relation Age of Onset  . Hyperlipidemia Mother   . Heart disease Mother   . Hypertension Mother   . Alzheimer's disease Mother   . Prostate cancer Father   . Stroke Father      Current Outpatient Prescriptions:  .  ELIQUIS 5 MG TABS tablet, , Disp: , Rfl: 10 .  lidocaine-prilocaine (EMLA) cream, Apply to affected area once, Disp: 30 g, Rfl: 3 .  lisinopril (PRINIVIL,ZESTRIL) 30 MG tablet, TAKE 1 TABLET (30 MG TOTAL) BY MOUTH DAILY., Disp: 90 tablet, Rfl: 3 .  metoprolol tartrate (LOPRESSOR) 25 MG tablet, , Disp: , Rfl: 10 .  omeprazole (PRILOSEC) 20 MG capsule, TAKE 1 CAPSULE (20 MG TOTAL) BY MOUTH 2 (TWO) TIMES DAILY., Disp: 180 capsule, Rfl: 2  Physical exam:  Vitals:   05/24/17 0959 05/24/17 1000 05/24/17 1004  BP:   (!) 176/100  Pulse:  93 93  Resp:   16  Temp:  97.7 F (36.5 C)   TempSrc:  Tympanic   Weight: 145 lb 1 oz (65.8 kg)     Physical Exam  Constitutional: He is oriented to person, place, and time and well-developed, well-nourished, and in no distress.  Ambulates with a cane  HENT:  Head: Normocephalic and atraumatic.  Mouth/Throat: Oropharynx is clear and moist.  Eyes: Pupils are equal, round, and reactive to light. EOM are normal.  Neck: Normal range of motion.  Cardiovascular: Normal rate, regular rhythm and normal  heart sounds.   Pulmonary/Chest: Effort normal and breath sounds normal.  Abdominal: Soft. Bowel sounds are normal.  Lymphadenopathy:  No palpable cervical or supraclavicular adenopathy  Neurological: He is alert and oriented to person, place, and time.  Skin: Skin is warm and dry.     CMP Latest Ref Rng & Units 04/26/2017  Glucose 65 - 99 mg/dL 103(H)  BUN 6 - 20 mg/dL 16  Creatinine 0.61 - 1.24 mg/dL 1.07  Sodium 135 - 145 mmol/L 132(L)  Potassium 3.5 - 5.1 mmol/L 3.9  Chloride 101 - 111 mmol/L 100(L)  CO2 22 - 32 mmol/L 23  Calcium 8.9 - 10.3 mg/dL 9.2  Total Protein  6.5 - 8.1 g/dL 7.6  Total Bilirubin 0.3 - 1.2 mg/dL 0.5  Alkaline Phos 38 - 126 U/L 66  AST 15 - 41 U/L 38  ALT 17 - 63 U/L 57   CBC Latest Ref Rng & Units 04/26/2017  WBC 3.8 - 10.6 K/uL 8.8  Hemoglobin 13.0 - 18.0 g/dL 12.5(L)  Hematocrit 40.0 - 52.0 % 37.2(L)  Platelets 150 - 440 K/uL 347    No images are attached to the encounter.  Nm Pet Image Restag (ps) Skull Base To Thigh  Result Date: 05/18/2017 CLINICAL DATA:  Initial treatment strategy for head neck carcinoma. oropharyngeal carcinoma Post radiotherapy. EXAM: NUCLEAR MEDICINE PET SKULL BASE TO THIGH TECHNIQUE: 12.8 mCi F-18 FDG was injected intravenously. Full-ring PET imaging was performed from the skull base to thigh after the radiotracer. CT data was obtained and used for attenuation correction and anatomic localization. FASTING BLOOD GLUCOSE:  Value: 94 mg/dl COMPARISON:  PET-CT 12/07/2016 FINDINGS: NECK Decreased metabolic activity hypopharynx. Mild symmetric activity remains SUV max equal 3.5 decreased from the 5.1. Large hypermetabolic RIGHT level 2 lymph node has decreased in size and metabolic activity. Node measures 8 mm decreased in size from 29 mm and now has background metabolic activity decreased from SUV max equals 7.8. No new hypermetabolic lymph nodes in the neck. CHEST Port in the LEFT chest wall. No hypermetabolic mediastinal or hilar nodes. No suspicious pulmonary nodules on the CT scan. ABDOMEN/PELVIS No abnormal hypermetabolic activity within the liver, pancreas, adrenal glands, or spleen. No hypermetabolic lymph nodes in the abdomen or pelvis. Atherosclerotic calcification of the aorta.  Prostate normal SKELETON No focal hypermetabolic activity to suggest skeletal metastasis. IMPRESSION: 1. Interval reduction of metabolic activity in the hypopharynx to near background level. 2. Resolution of metabolic activity in large RIGHT level 2 necrotic lymph node. 3. No evidence of active metastatic adenopathy in the neck. 4. No  distant metastatic disease . Electronically Signed   By: Suzy Bouchard M.D.   On: 05/18/2017 09:04     Assessment and plan- Patient is a 56 y.o. 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 I have discussed results of PET Ct with the patient and handed him a copy as well. Despite interruptions in chemo/RT due to above reasons mentioned in HPI, patient has had an excellent outcome. There is no residual hypermetabolism noted in the neck region. He does have considerable dental artifact in the region of hypopharynx and difficult to interpret hypermetabolism there. It appears symmetric and similar to background level. I am therefore not clinically concerned about residual malignancy. He does need to see Dr.  [facility] for fiber optic exams in the future. We will touch base with his office. We will get his port out at this time  I will see him back in 3 months with cbc, cmp and TSH for history and physical  Xerostomia- continues to use gum and drink water more often. Wishes to hold off on artificial saliva  Aortic thrombosis- follows up with vascular surgery at [facility]. Aortic bypass being considered     Visit Diagnosis 1. Neoplasm of oropharynx      Dr. Randa Evens, MD, MPH [facility] at [facility] Pager- [phone] 05/24/2017 10:23 AM

## 2017-06-09 DIAGNOSIS — C024 Malignant neoplasm of lingual tonsil: Secondary | ICD-10-CM | POA: Diagnosis not present

## 2017-06-09 DIAGNOSIS — R221 Localized swelling, mass and lump, neck: Secondary | ICD-10-CM | POA: Diagnosis not present

## 2017-06-16 DIAGNOSIS — R221 Localized swelling, mass and lump, neck: Secondary | ICD-10-CM | POA: Diagnosis not present

## 2017-06-16 DIAGNOSIS — Z452 Encounter for adjustment and management of vascular access device: Secondary | ICD-10-CM | POA: Diagnosis not present

## 2017-07-27 DIAGNOSIS — L570 Actinic keratosis: Secondary | ICD-10-CM | POA: Diagnosis not present

## 2017-07-27 DIAGNOSIS — Z85820 Personal history of malignant melanoma of skin: Secondary | ICD-10-CM | POA: Diagnosis not present

## 2017-08-23 ENCOUNTER — Inpatient Hospital Stay: Payer: BLUE CROSS/BLUE SHIELD

## 2017-08-23 ENCOUNTER — Other Ambulatory Visit: Payer: Self-pay

## 2017-08-23 ENCOUNTER — Encounter: Payer: Self-pay | Admitting: Oncology

## 2017-08-23 ENCOUNTER — Inpatient Hospital Stay: Payer: BLUE CROSS/BLUE SHIELD | Attending: Oncology | Admitting: Oncology

## 2017-08-23 VITALS — BP 162/87 | HR 85 | Temp 96.9°F | Resp 18 | Wt 149.0 lb

## 2017-08-23 DIAGNOSIS — E7800 Pure hypercholesterolemia, unspecified: Secondary | ICD-10-CM | POA: Insufficient documentation

## 2017-08-23 DIAGNOSIS — C779 Secondary and unspecified malignant neoplasm of lymph node, unspecified: Secondary | ICD-10-CM | POA: Diagnosis not present

## 2017-08-23 DIAGNOSIS — Z85820 Personal history of malignant melanoma of skin: Secondary | ICD-10-CM

## 2017-08-23 DIAGNOSIS — K117 Disturbances of salivary secretion: Secondary | ICD-10-CM | POA: Insufficient documentation

## 2017-08-23 DIAGNOSIS — Z7902 Long term (current) use of antithrombotics/antiplatelets: Secondary | ICD-10-CM | POA: Diagnosis not present

## 2017-08-23 DIAGNOSIS — I771 Stricture of artery: Secondary | ICD-10-CM | POA: Insufficient documentation

## 2017-08-23 DIAGNOSIS — I10 Essential (primary) hypertension: Secondary | ICD-10-CM | POA: Diagnosis not present

## 2017-08-23 DIAGNOSIS — D490 Neoplasm of unspecified behavior of digestive system: Secondary | ICD-10-CM

## 2017-08-23 DIAGNOSIS — R531 Weakness: Secondary | ICD-10-CM | POA: Insufficient documentation

## 2017-08-23 DIAGNOSIS — Z79899 Other long term (current) drug therapy: Secondary | ICD-10-CM | POA: Diagnosis not present

## 2017-08-23 DIAGNOSIS — Z923 Personal history of irradiation: Secondary | ICD-10-CM | POA: Insufficient documentation

## 2017-08-23 DIAGNOSIS — R5383 Other fatigue: Secondary | ICD-10-CM | POA: Diagnosis not present

## 2017-08-23 DIAGNOSIS — Z87891 Personal history of nicotine dependence: Secondary | ICD-10-CM | POA: Diagnosis not present

## 2017-08-23 DIAGNOSIS — Z7901 Long term (current) use of anticoagulants: Secondary | ICD-10-CM | POA: Diagnosis not present

## 2017-08-23 DIAGNOSIS — C109 Malignant neoplasm of oropharynx, unspecified: Secondary | ICD-10-CM | POA: Insufficient documentation

## 2017-08-23 DIAGNOSIS — H905 Unspecified sensorineural hearing loss: Secondary | ICD-10-CM

## 2017-08-23 DIAGNOSIS — I2510 Atherosclerotic heart disease of native coronary artery without angina pectoris: Secondary | ICD-10-CM | POA: Diagnosis not present

## 2017-08-23 DIAGNOSIS — K219 Gastro-esophageal reflux disease without esophagitis: Secondary | ICD-10-CM | POA: Insufficient documentation

## 2017-08-23 DIAGNOSIS — Z85819 Personal history of malignant neoplasm of unspecified site of lip, oral cavity, and pharynx: Secondary | ICD-10-CM

## 2017-08-23 DIAGNOSIS — Z9221 Personal history of antineoplastic chemotherapy: Secondary | ICD-10-CM | POA: Insufficient documentation

## 2017-08-23 DIAGNOSIS — Z08 Encounter for follow-up examination after completed treatment for malignant neoplasm: Secondary | ICD-10-CM

## 2017-08-23 LAB — COMPREHENSIVE METABOLIC PANEL
ALK PHOS: 85 U/L (ref 38–126)
ALT: 32 U/L (ref 17–63)
AST: 29 U/L (ref 15–41)
Albumin: 4.2 g/dL (ref 3.5–5.0)
Anion gap: 7 (ref 5–15)
BUN: 20 mg/dL (ref 6–20)
CALCIUM: 9.5 mg/dL (ref 8.9–10.3)
CHLORIDE: 100 mmol/L — AB (ref 101–111)
CO2: 27 mmol/L (ref 22–32)
CREATININE: 1.21 mg/dL (ref 0.61–1.24)
GFR calc Af Amer: >60 mL/min (ref >60)
GFR calc non Af Amer: >60 mL/min (ref >60)
GLUCOSE: 129 mg/dL — AB (ref 65–99)
Potassium: 4.3 mmol/L (ref 3.5–5.1)
SODIUM: 134 mmol/L — AB (ref 135–145)
Total Bilirubin: 0.4 mg/dL (ref 0.3–1.2)
Total Protein: 8.4 g/dL — ABNORMAL HIGH (ref 6.5–8.1)

## 2017-08-23 LAB — CBC WITH DIFFERENTIAL/PLATELET
Basophils Absolute: 0 10*3/uL (ref 0–0.1)
Basophils Relative: 1 %
EOS ABS: 0.2 10*3/uL (ref 0–0.7)
Eosinophils Relative: 3 %
HCT: 38.9 % — ABNORMAL LOW (ref 40.0–52.0)
HEMOGLOBIN: 13 g/dL (ref 13.0–18.0)
LYMPHS ABS: 1.1 10*3/uL (ref 1.0–3.6)
Lymphocytes Relative: 15 %
MCH: 26.9 pg (ref 26.0–34.0)
MCHC: 33.3 g/dL (ref 32.0–36.0)
MCV: 80.7 fL (ref 80.0–100.0)
MONO ABS: 0.7 10*3/uL (ref 0.2–1.0)
MONOS PCT: 9 %
NEUTROS PCT: 72 %
Neutro Abs: 5.3 10*3/uL (ref 1.4–6.5)
Platelets: 419 10*3/uL (ref 150–440)
RBC: 4.82 MIL/uL (ref 4.40–5.90)
RDW: 15.2 % — AB (ref 11.5–14.5)
WBC: 7.3 10*3/uL (ref 3.8–10.6)

## 2017-08-23 LAB — TSH: TSH: 1.322 u[IU]/mL (ref 0.350–4.500)

## 2017-08-23 NOTE — Progress Notes (Signed)
Here for follow up  Doing well overall.

## 2017-08-23 NOTE — Progress Notes (Signed)
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 Con Memos in March 2018 or neck mass which has been persistent for 1 month. The mass developed suddenly and was fairly asymptomatic. Patient underwent a comprehensive ENT exam which showed asymmetric and enlarged right tonsil. 2 x 4 cm level II mass was noted on exam. CT soft tissue neck on 10/30/2016 showed necrotic level II lymph node measuring 4.7 cm x 2.7 cm. Very slight tonsillar asymmetry on the right raises the possibility of an occult right tonsillar carcinoma. Patient underwent core biopsy of the cervical lymph node which showed metastatic squamous cell carcinoma P16 positive. Patient has been referred to [place] for further management.  PET/CT on 12/07/16 showed: 1. Isolated nodal metastasis in the right level 2 station. 2. Subtle asymmetric enlargement and hypermetabolism of the right palatine tonsil . This likely corresponds to the primary lesion detailed on clinic notes. 3. No extracervical disease identified. 4. Age advanced coronary artery atherosclerosis. Recommend assessment of coronary risk factors and consideration of medical therapy. 5. Aortic atherosclerosis.  Baseline audiogram on 12/14/2016 showed mild sensorineural hearing loss in his right ear and mild-to-moderate sensorineural hearing loss in his left ear. Decreased hearing as compared to 2010  Patient  received 2 weekly doses of cisplatin concurrent with RT. Following this patient developed acute aortic thrombosis  CT abdomen angiogram on 01/07/17 showed: Impression: 1. Occlusion of the infrarenal abdominal aorta just superior to the bifurcation extending into bilateral common iliac arteries. There is reconstitution of flow in the right common iliac artery and left external iliac artery 2. Occlusion of the left popliteal artery superior the knee. Distal reconstitution of flow is seen in the posterior tibial artery with flow visualized the foot. 3. Nonvisualization of flow in the right anterior tibial artery just distal to takeoff. Right posterior tibial artery is seen to the level of the ankle. 4. There is occlusion of the left femoral profunda artery just distal to its takeoff 5. Moderate stenosis of the right popliteal artery inferior to the knee at the level of the trifurcation of the calf arteries.  He underwent EMBOLECTOMY/THROMBECTOMY AORTOILIAC ARTERY BY LEG INCISION Bilateral 01/09/2017. BYPASS GRAFT FEMORAL-FEMORAL Bilateral 01/09/2017   He resumed radiation therapy only after 2-3 weeks time and did not wish to proceed with chemotherapy. He continued to do poorly in terms of fatigue, weight loss and complications of aortic occlusion. Radiation therapy was cut short by 1 week  PET/CT scan 12 weeks post treatment showed no residual hypermetabolism  Interval history- continues to have neuropathy in his left leg. Also has xerostomia. Xerostomia is slowly improving. His taste and appetite is coming back  ECOG PS- 1 Pain scale- 0  Review of systems- Review of Systems  Constitutional: Positive for malaise/fatigue. Negative for chills, fever and weight loss.  HENT: Negative for congestion, ear discharge and nosebleeds.        Dry mouth  Eyes: Negative for blurred vision.  Respiratory: Negative for cough, hemoptysis, sputum production, shortness of  breath and wheezing.     Cardiovascular: Negative for chest pain, palpitations, orthopnea and claudication.  Gastrointestinal: Negative for abdominal pain, blood in stool, constipation, diarrhea, heartburn, melena, nausea and vomiting.  Genitourinary: Negative for dysuria, flank pain, frequency, hematuria and urgency.  Musculoskeletal: Negative for back pain, joint pain and myalgias.  Skin: Negative for rash.  Neurological: Positive for tingling. Negative for dizziness, focal weakness, seizures, weakness and headaches.  Endo/Heme/Allergies: Does not bruise/bleed easily.  Psychiatric/Behavioral: Negative for depression and suicidal ideas. The patient does not have insomnia.      No Known Allergies   Past Medical History:  Diagnosis Date  . Basal cell carcinoma (BCC) of back   . Basal cell carcinoma (BCC) of nasal sidewall   . Dizziness   . GERD (gastroesophageal reflux disease)   . Hypercholesterolemia   . Hypertension    hx of HBP readings  . Metastatic squamous cell carcinoma ([place]) 2018   bx cervical lymph node     Past Surgical History:  Procedure Laterality Date  . COLONOSCOPY    . ESOPHAGOGASTRODUODENOSCOPY (EGD) WITH PROPOFOL N/A 05/24/2015   Procedure: ESOPHAGOGASTRODUODENOSCOPY (EGD) WITH PROPOFOL;  Surgeon: Manya Silvas, MD;  Location: [facility] ENDOSCOPY;  Service: Endoscopy;  Laterality: N/A;  . IR FLUORO GUIDE PORT INSERTION LEFT  12/09/2016  . SAVORY DILATION N/A 05/24/2015   Procedure: SAVORY DILATION;  Surgeon: Manya Silvas, MD;  Location: [facility] ENDOSCOPY;  Service: Endoscopy;  Laterality: N/A;    Social History   Socioeconomic History  . Marital status: Married    Spouse name: Not on file  . Number of children: Not on file  . Years of education: Not on file  . Highest education level: Not on file  Social Needs  . Financial resource strain: Not on file  . Food insecurity - worry: Not on file  . Food insecurity - inability: Not on file  . Transportation needs - medical: Not on  file  . Transportation needs - non-medical: Not on file  Occupational History  . Not on file  Tobacco Use  . Smoking status: Former Smoker    Last attempt to quit: 11/11/1996    Years since quitting: 20.7  . Smokeless tobacco: Never Used  Substance and Sexual Activity  . Alcohol use: No    Alcohol/week: 0.0 oz    Comment: none for 6 months  . Drug use: No  . Sexual activity: Yes  Other Topics Concern  . Not on file  Social History Narrative  . Not on file    Family History  Problem Relation Age of Onset  . Hyperlipidemia Mother   . Heart disease Mother   . Hypertension Mother   . Alzheimer's disease Mother   . Prostate cancer Father   . Stroke Father      Current Outpatient Medications:  .  clopidogrel (PLAVIX) 75 MG tablet, Take 75 mg by mouth daily., Disp: , Rfl:  .  ELIQUIS 5 MG TABS tablet, 2 (two) times daily. , Disp: , Rfl: 10 .  lidocaine-prilocaine (EMLA) cream, Apply to affected area once, Disp: 30 g, Rfl: 3 .  lisinopril (PRINIVIL,ZESTRIL) 30 MG tablet, TAKE 1 TABLET (30 MG TOTAL) BY MOUTH DAILY., Disp: 90 tablet, Rfl: 3 .  metoprolol tartrate (LOPRESSOR) 25 MG tablet, , Disp: , Rfl: 10 .  omeprazole (PRILOSEC) 20 MG capsule, TAKE 1 CAPSULE (20 MG TOTAL) BY MOUTH 2 (TWO) TIMES DAILY. (Patient taking differently: Take 20 mg by mouth daily. ), Disp: 180 capsule,  Rfl: 2 .  HYDROcodone-acetaminophen (NORCO/VICODIN) 5-325 MG tablet, Take by mouth., Disp: , Rfl:  .  Multiple Vitamin (MULTIVITAMIN) capsule, Take by mouth., Disp: , Rfl:  .  ondansetron (ZOFRAN) 4 MG tablet, Take by mouth., Disp: , Rfl:   Physical exam:  Vitals:   08/23/17 0942  BP: (!) 162/87  Pulse: 85  Resp: 18  Temp: (!) 96.9 F (36.1 C)  TempSrc: Tympanic  Weight: 149 lb 0.5 oz (67.6 kg)   Physical Exam  Constitutional: He is oriented to person, place, and time and well-developed, well-nourished, and in no distress.  HENT:  Head: Normocephalic and atraumatic.  Mouth/Throat: Oropharynx is  clear and moist.  Eyes: EOM are normal. Pupils are equal, round, and reactive to light.  Neck: Normal range of motion.  Cardiovascular: Normal rate, regular rhythm and normal heart sounds.  Pulmonary/Chest: Effort normal and breath sounds normal.  Abdominal: Soft. Bowel sounds are normal.  Lymphadenopathy:  No palpable cervical, supraclavicular or submandibular adenopathy  Neurological: He is alert and oriented to person, place, and time.  Skin: Skin is warm and dry.     CMP Latest Ref Rng & Units 08/23/2017  Glucose 65 - 99 mg/dL 129(H)  BUN 6 - 20 mg/dL 20  Creatinine 0.61 - 1.24 mg/dL 1.21  Sodium 135 - 145 mmol/L 134(L)  Potassium 3.5 - 5.1 mmol/L 4.3  Chloride 101 - 111 mmol/L 100(L)  CO2 22 - 32 mmol/L 27  Calcium 8.9 - 10.3 mg/dL 9.5  Total Protein 6.5 - 8.1 g/dL 8.4(H)  Total Bilirubin 0.3 - 1.2 mg/dL 0.4  Alkaline Phos 38 - 126 U/L 85  AST 15 - 41 U/L 29  ALT 17 - 63 U/L 32   CBC Latest Ref Rng & Units 08/23/2017  WBC 3.8 - 10.6 K/uL 7.3  Hemoglobin 13.0 - 18.0 g/dL 13.0  Hematocrit 40.0 - 52.0 % 38.9(L)  Platelets 150 - 440 K/uL 419     Assessment and plan- Patient is a 57 y.o. 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  Patient was also seen by Dr. Tami Ribas and had an NPL exam which did not reveal any recurrent tumor.  He will be seeing Dr. Tami Ribas again in 3 months time.  I will see him back in 6 months with a CBC CMP and TSH  Xerostomia: Slowly improving.  Patient does not desire to use any artificial saliva.  Continue to monitor  Aortic occlusive disease: Patient being followed by vascular surgery and is currently on Eliquis and Plavix   Visit Diagnosis 1. Neoplasm of oropharynx   2. Encounter for follow-up surveillance of pharyngeal cancer      Dr. Randa Evens, MD, MPH [facility]  at [facility] Pager- [phone] 08/23/2017 10:20 AM

## 2017-09-23 DIAGNOSIS — R0989 Other specified symptoms and signs involving the circulatory and respiratory systems: Secondary | ICD-10-CM | POA: Diagnosis not present

## 2017-09-23 DIAGNOSIS — I739 Peripheral vascular disease, unspecified: Secondary | ICD-10-CM | POA: Diagnosis not present

## 2017-09-23 DIAGNOSIS — G6289 Other specified polyneuropathies: Secondary | ICD-10-CM | POA: Diagnosis not present

## 2017-10-12 DIAGNOSIS — G6289 Other specified polyneuropathies: Secondary | ICD-10-CM | POA: Diagnosis not present

## 2017-10-14 DIAGNOSIS — I739 Peripheral vascular disease, unspecified: Secondary | ICD-10-CM | POA: Diagnosis not present

## 2017-10-26 ENCOUNTER — Ambulatory Visit (INDEPENDENT_AMBULATORY_CARE_PROVIDER_SITE_OTHER): Payer: BLUE CROSS/BLUE SHIELD | Admitting: Internal Medicine

## 2017-10-26 ENCOUNTER — Encounter: Payer: Self-pay | Admitting: Internal Medicine

## 2017-10-26 VITALS — BP 132/86 | HR 86 | Temp 97.4°F | Resp 18 | Wt 153.8 lb

## 2017-10-26 DIAGNOSIS — E7800 Pure hypercholesterolemia, unspecified: Secondary | ICD-10-CM | POA: Diagnosis not present

## 2017-10-26 DIAGNOSIS — I10 Essential (primary) hypertension: Secondary | ICD-10-CM

## 2017-10-26 DIAGNOSIS — I7410 Embolism and thrombosis of unspecified parts of aorta: Secondary | ICD-10-CM | POA: Diagnosis not present

## 2017-10-26 DIAGNOSIS — Z0000 Encounter for general adult medical examination without abnormal findings: Secondary | ICD-10-CM | POA: Diagnosis not present

## 2017-10-26 DIAGNOSIS — Z125 Encounter for screening for malignant neoplasm of prostate: Secondary | ICD-10-CM

## 2017-10-26 DIAGNOSIS — D490 Neoplasm of unspecified behavior of digestive system: Secondary | ICD-10-CM | POA: Diagnosis not present

## 2017-10-26 DIAGNOSIS — I700 Atherosclerosis of aorta: Secondary | ICD-10-CM

## 2017-10-26 DIAGNOSIS — K219 Gastro-esophageal reflux disease without esophagitis: Secondary | ICD-10-CM

## 2017-10-26 NOTE — Assessment & Plan Note (Signed)
Physical today 10/26/17.  Check psa with next labs.  Colonoscopy 06/2013 - internal hemorrhoids.

## 2017-10-26 NOTE — Progress Notes (Signed)
[name], male   DOB: [birth year], 57 y.o.   MRN: [id number]   Subjective:    [name], male    DOB: 1961-04-24, 57 y.o.   MRN: [id number]  HPI  Patient here for his physical exam.  Since his last visit, he was diagnosed with squamous cell carcinoma of the oropharynx.  Followed by oncology.  S/p chemotherapy/RT.  doinwe ll.  Last evaluated by oncology 08/23/17.  No evidence of recurrence.  Is s/p thrombectomy, placement of aortic stent and right to left fem-fem bypass.  .  On eliquis.  With life style limiting claudication.  Planning for popliteal intervention.  Also planning to f/u with vascular surgery with carotid duplex 3-6 months.  Other than his leg, he states he is doing better.  Feels better.  No chest pain.  No sob.  No acid reflux.  No abdominal pain.  Bowels moving.  States blood pressures averaging 130-132/84-86.  Handling stress ok.     Past Medical History:  Diagnosis Date  . Basal cell carcinoma (BCC) of back   . Basal cell carcinoma (BCC) of nasal sidewall   . Dizziness   . GERD (gastroesophageal reflux disease)   . Hypercholesterolemia   . Hypertension    hx of HBP readings  . Metastatic squamous cell carcinoma ([place]) 2018   bx cervical lymph node   Past Surgical History:  Procedure Laterality Date  . COLONOSCOPY    . ESOPHAGOGASTRODUODENOSCOPY (EGD) WITH PROPOFOL N/A 05/24/2015   Procedure: ESOPHAGOGASTRODUODENOSCOPY (EGD) WITH PROPOFOL;  Surgeon: Manya Silvas, MD;  Location: [facility] ENDOSCOPY;  Service: Endoscopy;  Laterality: N/A;  . IR FLUORO GUIDE PORT INSERTION LEFT  12/09/2016  . SAVORY DILATION N/A 05/24/2015   Procedure: SAVORY DILATION;  Surgeon: Manya Silvas, MD;  Location: [facility] ENDOSCOPY;  Service: Endoscopy;  Laterality: N/A;   Family History  Problem Relation Age of Onset  . Hyperlipidemia Mother   . Heart disease Mother   . Hypertension Mother   . Alzheimer's disease Mother   . Prostate cancer Father   . Stroke  Father    Social History   Socioeconomic History  . Marital status: Married    Spouse name: None  . Number of children: None  . Years of education: None  . Highest education level: None  Social Needs  . Financial resource strain: None  . Food insecurity - worry: None  . Food insecurity - inability: None  . Transportation needs - medical: None  . Transportation needs - non-medical: None  Occupational History  . None  Tobacco Use  . Smoking status: Former Smoker    Last attempt to quit: 11/11/1996    Years since quitting: 20.9  . Smokeless tobacco: Never Used  Substance and Sexual Activity  . Alcohol use: No    Alcohol/week: 0.0 oz    Comment: none for 6 months  . Drug use: No  . Sexual activity: Yes  Other Topics Concern  . None  Social History Narrative  . None    Outpatient Encounter Medications as of 10/26/2017  Medication Sig  . clopidogrel (PLAVIX) 75 MG tablet Take 75 mg by mouth daily.  Marland Kitchen ELIQUIS 5 MG TABS tablet 2 (two) times daily.   Marland Kitchen lisinopril (PRINIVIL,ZESTRIL) 30 MG tablet TAKE 1 TABLET (30 MG TOTAL) BY MOUTH DAILY.  . metoprolol tartrate (LOPRESSOR) 25 MG tablet   . omeprazole (PRILOSEC) 20 MG capsule TAKE 1 CAPSULE (20 MG TOTAL) BY MOUTH 2 (TWO)  TIMES DAILY. (Patient taking differently: Take 20 mg by mouth daily. )  . HYDROcodone-acetaminophen (NORCO/VICODIN) 5-325 MG tablet Take by mouth.  . lidocaine-prilocaine (EMLA) cream Apply to affected area once (Patient not taking: Reported on 10/26/2017)  . Multiple Vitamin (MULTIVITAMIN) capsule Take by mouth.  . ondansetron (ZOFRAN) 4 MG tablet Take by mouth.   No facility-administered encounter medications on file as of 10/26/2017.     Review of Systems  Constitutional: Negative for appetite change and unexpected weight change.  HENT: Negative for congestion and sinus pressure.   Eyes: Negative for pain and visual disturbance.  Respiratory: Negative for cough, chest tightness and shortness of breath.     Cardiovascular: Negative for chest pain, palpitations and leg swelling.  Gastrointestinal: Negative for abdominal pain, diarrhea, nausea and vomiting.  Genitourinary: Negative for difficulty urinating and dysuria.  Musculoskeletal: Negative for joint swelling and myalgias.       Left leg pain as outlined.    Skin: Negative for color change and rash.  Neurological: Negative for dizziness, light-headedness and headaches.  Hematological: Negative for adenopathy. Does not bruise/bleed easily.  Psychiatric/Behavioral: Negative for agitation and dysphoric mood.       Objective:     Blood pressure rechecked by me:  130-132/84  Physical Exam  Constitutional: He is oriented to person, place, and time. He appears well-developed and well-nourished. No distress.  HENT:  Head: Normocephalic and atraumatic.  Nose: Nose normal.  Mouth/Throat: Oropharynx is clear and moist. No oropharyngeal exudate.  Eyes: Conjunctivae are normal. Right eye exhibits no discharge. Left eye exhibits no discharge.  Neck: Neck supple. No thyromegaly present.  Cardiovascular: Normal rate and regular rhythm.  Pulmonary/Chest: Breath sounds normal. No respiratory distress. He has no wheezes.  Abdominal: Soft. Bowel sounds are normal. There is no tenderness.  Genitourinary:  Genitourinary Comments: Not performed.   Musculoskeletal: He exhibits no edema or tenderness.  Lymphadenopathy:    He has no cervical adenopathy.  Neurological: He is alert and oriented to person, place, and time.  Skin: Skin is warm and dry. No rash noted. No erythema.  Psychiatric: He has a normal mood and affect. His behavior is normal.    BP 132/86   Pulse 86   Temp (!) 97.4 F (36.3 C) (Oral)   Resp 18   Wt 153 lb 12.8 oz (69.8 kg)   SpO2 98%   BMI 24.09 kg/m  Wt Readings from Last 3 Encounters:  10/26/17 153 lb 12.8 oz (69.8 kg)  08/23/17 149 lb 0.5 oz (67.6 kg)  05/24/17 145 lb 1 oz (65.8 kg)     Lab Results  Component  Value Date   WBC 7.3 08/23/2017   HGB 13.0 08/23/2017   HCT 38.9 (L) 08/23/2017   PLT 419 08/23/2017   GLUCOSE 129 (H) 08/23/2017   CHOL 306 (H) 07/17/2016   TRIG 240.0 (H) 07/17/2016   HDL 39.60 07/17/2016   LDLDIRECT 181.0 07/17/2016   LDLCALC 202 (H) 04/20/2014   ALT 32 08/23/2017   AST 29 08/23/2017   NA 134 (L) 08/23/2017   K 4.3 08/23/2017   CL 100 (L) 08/23/2017   CREATININE 1.21 08/23/2017   BUN 20 08/23/2017   CO2 27 08/23/2017   TSH 1.322 08/23/2017   PSA 0.84 07/17/2016   INR 0.97 12/09/2016    Nm Pet Image Restag (ps) Skull Base To Thigh  Result Date: 05/18/2017 CLINICAL DATA:  Initial treatment strategy for head neck carcinoma. oropharyngeal carcinoma Post radiotherapy. EXAM: NUCLEAR MEDICINE PET  SKULL BASE TO THIGH TECHNIQUE: 12.8 mCi F-18 FDG was injected intravenously. Full-ring PET imaging was performed from the skull base to thigh after the radiotracer. CT data was obtained and used for attenuation correction and anatomic localization. FASTING BLOOD GLUCOSE:  Value: 94 mg/dl COMPARISON:  PET-CT 12/07/2016 FINDINGS: NECK Decreased metabolic activity hypopharynx. Mild symmetric activity remains SUV max equal 3.5 decreased from the 5.1. Large hypermetabolic RIGHT level 2 lymph node has decreased in size and metabolic activity. Node measures 8 mm decreased in size from 29 mm and now has background metabolic activity decreased from SUV max equals 7.8. No new hypermetabolic lymph nodes in the neck. CHEST Port in the LEFT chest wall. No hypermetabolic mediastinal or hilar nodes. No suspicious pulmonary nodules on the CT scan. ABDOMEN/PELVIS No abnormal hypermetabolic activity within the liver, pancreas, adrenal glands, or spleen. No hypermetabolic lymph nodes in the abdomen or pelvis. Atherosclerotic calcification of the aorta.  Prostate normal SKELETON No focal hypermetabolic activity to suggest skeletal metastasis. IMPRESSION: 1. Interval reduction of metabolic activity in the  hypopharynx to near background level. 2. Resolution of metabolic activity in large RIGHT level 2 necrotic lymph node. 3. No evidence of active metastatic adenopathy in the neck. 4. No distant metastatic disease . Electronically Signed   By: Suzy Bouchard M.D.   On: 05/18/2017 09:04       Assessment & Plan:   Problem List Items Addressed This Visit    Aortic occlusion ([place])    S/p stent placement.  Followed by vascular surgery.        Essential hypertension, benign    Blood pressure on recheck improved.  Hold on making changes.  Same medication regimen.  Follow pressures.  Follow metabolic panel.       Relevant Orders   Basic metabolic panel   GERD (gastroesophageal reflux disease)    Controlled on omeprazole.        Health care maintenance    Physical today 10/26/17.  Check psa with next labs.  Colonoscopy 06/2013 - internal hemorrhoids.        Hypercholesterolemia    Low cholesterol diet and exercise.  Follow lipid panel.        Relevant Orders   Lipid panel   Neoplasm of oropharynx    S/p chemo and RT.  Followed by oncology.         Other Visit Diagnoses    Routine general medical examination at a health care facility    -  Primary   Prostate cancer screening       Relevant Orders   PSA       Einar Pheasant, MD

## 2017-10-30 ENCOUNTER — Encounter: Payer: Self-pay | Admitting: Internal Medicine

## 2017-10-30 NOTE — Assessment & Plan Note (Signed)
S/p chemo and RT.  Followed by oncology.

## 2017-10-30 NOTE — Assessment & Plan Note (Signed)
Low cholesterol diet and exercise.  Follow lipid panel.   

## 2017-10-30 NOTE — Assessment & Plan Note (Signed)
S/p stent placement.  Followed by vascular surgery.  

## 2017-10-30 NOTE — Assessment & Plan Note (Signed)
Controlled on omeprazole.   

## 2017-10-30 NOTE — Assessment & Plan Note (Signed)
Blood pressure on recheck improved.  Hold on making changes.  Same medication regimen.  Follow pressures.  Follow metabolic panel.

## 2017-11-03 ENCOUNTER — Other Ambulatory Visit (INDEPENDENT_AMBULATORY_CARE_PROVIDER_SITE_OTHER): Payer: BLUE CROSS/BLUE SHIELD

## 2017-11-03 DIAGNOSIS — I10 Essential (primary) hypertension: Secondary | ICD-10-CM

## 2017-11-03 DIAGNOSIS — Z125 Encounter for screening for malignant neoplasm of prostate: Secondary | ICD-10-CM

## 2017-11-03 DIAGNOSIS — E7800 Pure hypercholesterolemia, unspecified: Secondary | ICD-10-CM | POA: Diagnosis not present

## 2017-11-03 LAB — LDL CHOLESTEROL, DIRECT: Direct LDL: 234 mg/dL

## 2017-11-03 LAB — BASIC METABOLIC PANEL
BUN: 18 mg/dL (ref 6–23)
CALCIUM: 10.5 mg/dL (ref 8.4–10.5)
CHLORIDE: 107 meq/L (ref 96–112)
CO2: 23 meq/L (ref 19–32)
Creatinine, Ser: 1.36 mg/dL (ref 0.40–1.50)
GFR: 57.34 mL/min — ABNORMAL LOW (ref >60.00)
GLUCOSE: 115 mg/dL — AB (ref 70–99)
POTASSIUM: 5.1 meq/L (ref 3.5–5.1)
SODIUM: 148 meq/L — AB (ref 135–145)

## 2017-11-03 LAB — LIPID PANEL
CHOLESTEROL: 357 mg/dL — AB (ref 0–200)
HDL: 42 mg/dL (ref >39.00)
NonHDL: 314.71
Total CHOL/HDL Ratio: 8
Triglycerides: 277 mg/dL — ABNORMAL HIGH (ref 0.0–149.0)
VLDL: 55.4 mg/dL — AB (ref 0.0–40.0)

## 2017-11-03 LAB — PSA: PSA: 1.07 ng/mL (ref 0.10–4.00)

## 2017-11-08 DIAGNOSIS — I10 Essential (primary) hypertension: Secondary | ICD-10-CM | POA: Diagnosis not present

## 2017-11-08 DIAGNOSIS — I743 Embolism and thrombosis of arteries of the lower extremities: Secondary | ICD-10-CM | POA: Diagnosis not present

## 2017-11-08 DIAGNOSIS — K219 Gastro-esophageal reflux disease without esophagitis: Secondary | ICD-10-CM | POA: Diagnosis not present

## 2017-11-08 DIAGNOSIS — Z8589 Personal history of malignant neoplasm of other organs and systems: Secondary | ICD-10-CM | POA: Diagnosis not present

## 2017-11-08 DIAGNOSIS — I739 Peripheral vascular disease, unspecified: Secondary | ICD-10-CM | POA: Diagnosis not present

## 2017-11-08 DIAGNOSIS — G4733 Obstructive sleep apnea (adult) (pediatric): Secondary | ICD-10-CM | POA: Diagnosis not present

## 2017-11-08 DIAGNOSIS — Z7901 Long term (current) use of anticoagulants: Secondary | ICD-10-CM | POA: Diagnosis not present

## 2017-11-08 DIAGNOSIS — Z87891 Personal history of nicotine dependence: Secondary | ICD-10-CM | POA: Diagnosis not present

## 2017-11-08 DIAGNOSIS — T82858A Stenosis of vascular prosthetic devices, implants and grafts, initial encounter: Secondary | ICD-10-CM | POA: Diagnosis not present

## 2017-11-08 DIAGNOSIS — I70212 Atherosclerosis of native arteries of extremities with intermittent claudication, left leg: Secondary | ICD-10-CM | POA: Diagnosis not present

## 2017-11-08 HISTORY — PX: OTHER SURGICAL HISTORY: SHX169

## 2017-11-09 ENCOUNTER — Other Ambulatory Visit: Payer: Self-pay | Admitting: Internal Medicine

## 2017-11-09 DIAGNOSIS — R7989 Other specified abnormal findings of blood chemistry: Secondary | ICD-10-CM

## 2017-11-09 NOTE — Progress Notes (Signed)
Order placed for f/u met b 

## 2017-11-16 ENCOUNTER — Other Ambulatory Visit: Payer: Self-pay

## 2017-11-16 DIAGNOSIS — E7800 Pure hypercholesterolemia, unspecified: Secondary | ICD-10-CM

## 2017-11-16 MED ORDER — PRAVASTATIN SODIUM 10 MG PO TABS: 10.0000 mg | ORAL_TABLET | Freq: Every day | ORAL | 0 refills | Status: DC

## 2017-11-24 ENCOUNTER — Other Ambulatory Visit (INDEPENDENT_AMBULATORY_CARE_PROVIDER_SITE_OTHER): Payer: BLUE CROSS/BLUE SHIELD

## 2017-11-24 DIAGNOSIS — R7989 Other specified abnormal findings of blood chemistry: Secondary | ICD-10-CM | POA: Diagnosis not present

## 2017-11-24 LAB — BASIC METABOLIC PANEL
BUN: 18 mg/dL (ref 6–23)
CALCIUM: 9.8 mg/dL (ref 8.4–10.5)
CO2: 29 mEq/L (ref 19–32)
CREATININE: 1.21 mg/dL (ref 0.40–1.50)
Chloride: 98 mEq/L (ref 96–112)
GFR: 65.6 mL/min (ref >60.00)
Glucose, Bld: 95 mg/dL (ref 70–99)
Potassium: 4.2 mEq/L (ref 3.5–5.1)
Sodium: 133 mEq/L — ABNORMAL LOW (ref 135–145)

## 2017-11-25 ENCOUNTER — Encounter: Payer: Self-pay | Admitting: Internal Medicine

## 2017-12-08 DIAGNOSIS — Z85818 Personal history of malignant neoplasm of other sites of lip, oral cavity, and pharynx: Secondary | ICD-10-CM | POA: Diagnosis not present

## 2017-12-08 DIAGNOSIS — R221 Localized swelling, mass and lump, neck: Secondary | ICD-10-CM | POA: Diagnosis not present

## 2017-12-09 ENCOUNTER — Encounter: Payer: Self-pay | Admitting: Internal Medicine

## 2017-12-10 ENCOUNTER — Other Ambulatory Visit: Payer: Self-pay

## 2017-12-10 MED ORDER — PRAVASTATIN SODIUM 10 MG PO TABS: 10.0000 mg | ORAL_TABLET | Freq: Every day | ORAL | 1 refills | Status: DC

## 2017-12-10 NOTE — Telephone Encounter (Signed)
Spoke with wife at appt to let her know that rx refill has been sent in

## 2017-12-13 DIAGNOSIS — I739 Peripheral vascular disease, unspecified: Secondary | ICD-10-CM | POA: Diagnosis not present

## 2017-12-13 DIAGNOSIS — Z923 Personal history of irradiation: Secondary | ICD-10-CM | POA: Insufficient documentation

## 2017-12-13 DIAGNOSIS — I70219 Atherosclerosis of native arteries of extremities with intermittent claudication, unspecified extremity: Secondary | ICD-10-CM | POA: Insufficient documentation

## 2017-12-17 ENCOUNTER — Telehealth: Payer: Self-pay | Admitting: *Deleted

## 2017-12-17 DIAGNOSIS — E871 Hypo-osmolality and hyponatremia: Secondary | ICD-10-CM

## 2017-12-17 DIAGNOSIS — E7800 Pure hypercholesterolemia, unspecified: Secondary | ICD-10-CM

## 2017-12-17 NOTE — Telephone Encounter (Signed)
Pt has lab appt on Tues 12/21/17. Please place future orders.

## 2017-12-17 NOTE — Telephone Encounter (Signed)
Order placed for labs.

## 2017-12-21 ENCOUNTER — Encounter: Payer: Self-pay | Admitting: Internal Medicine

## 2017-12-21 ENCOUNTER — Other Ambulatory Visit (INDEPENDENT_AMBULATORY_CARE_PROVIDER_SITE_OTHER): Payer: BLUE CROSS/BLUE SHIELD

## 2017-12-21 ENCOUNTER — Telehealth: Payer: Self-pay | Admitting: Radiology

## 2017-12-21 DIAGNOSIS — E7800 Pure hypercholesterolemia, unspecified: Secondary | ICD-10-CM | POA: Diagnosis not present

## 2017-12-21 DIAGNOSIS — E871 Hypo-osmolality and hyponatremia: Secondary | ICD-10-CM | POA: Diagnosis not present

## 2017-12-21 LAB — HEPATIC FUNCTION PANEL
ALBUMIN: 4.2 g/dL (ref 3.5–5.2)
ALK PHOS: 63 U/L (ref 39–117)
ALT: 44 U/L (ref 0–53)
AST: 29 U/L (ref 0–37)
BILIRUBIN DIRECT: 0 mg/dL (ref 0.0–0.3)
TOTAL PROTEIN: 7.4 g/dL (ref 6.0–8.3)
Total Bilirubin: 0.4 mg/dL (ref 0.2–1.2)

## 2017-12-21 LAB — SODIUM: Sodium: 135 mEq/L (ref 135–145)

## 2017-12-21 NOTE — Telephone Encounter (Signed)
Pt came in for labs and wanted to know if he can have his cholesterol checked. Pt stated he also wanted to verify he is taking his Crestor correctly. Pt states he is taking hid medication every M,W,F but states he thought PCP stated to take every day. Please advise.

## 2017-12-21 NOTE — Telephone Encounter (Signed)
He just had cholesterol checked 10/2017.  Too early to have cholesterol checked.  Per previous message, he is supposed to be on pravastatin q Monday, Wednesday and Friday.

## 2017-12-23 NOTE — Telephone Encounter (Signed)
Pt aware to take MWF

## 2018-01-09 ENCOUNTER — Other Ambulatory Visit: Payer: Self-pay | Admitting: Internal Medicine

## 2018-01-25 DIAGNOSIS — L820 Inflamed seborrheic keratosis: Secondary | ICD-10-CM | POA: Diagnosis not present

## 2018-01-25 DIAGNOSIS — D044 Carcinoma in situ of skin of scalp and neck: Secondary | ICD-10-CM | POA: Diagnosis not present

## 2018-01-25 DIAGNOSIS — X32XXXA Exposure to sunlight, initial encounter: Secondary | ICD-10-CM | POA: Diagnosis not present

## 2018-01-25 DIAGNOSIS — Z85820 Personal history of malignant melanoma of skin: Secondary | ICD-10-CM | POA: Diagnosis not present

## 2018-01-25 DIAGNOSIS — Z08 Encounter for follow-up examination after completed treatment for malignant neoplasm: Secondary | ICD-10-CM | POA: Diagnosis not present

## 2018-01-25 DIAGNOSIS — Z85828 Personal history of other malignant neoplasm of skin: Secondary | ICD-10-CM | POA: Diagnosis not present

## 2018-01-25 DIAGNOSIS — D485 Neoplasm of uncertain behavior of skin: Secondary | ICD-10-CM | POA: Diagnosis not present

## 2018-01-25 DIAGNOSIS — L570 Actinic keratosis: Secondary | ICD-10-CM | POA: Diagnosis not present

## 2018-01-26 ENCOUNTER — Ambulatory Visit: Payer: BLUE CROSS/BLUE SHIELD | Admitting: Internal Medicine

## 2018-01-26 ENCOUNTER — Encounter: Payer: Self-pay | Admitting: Internal Medicine

## 2018-01-26 VITALS — BP 124/64 | HR 82 | Temp 97.5°F | Resp 16 | Wt 152.8 lb

## 2018-01-26 DIAGNOSIS — R5383 Other fatigue: Secondary | ICD-10-CM

## 2018-01-26 DIAGNOSIS — D490 Neoplasm of unspecified behavior of digestive system: Secondary | ICD-10-CM

## 2018-01-26 DIAGNOSIS — K219 Gastro-esophageal reflux disease without esophagitis: Secondary | ICD-10-CM | POA: Diagnosis not present

## 2018-01-26 DIAGNOSIS — E7800 Pure hypercholesterolemia, unspecified: Secondary | ICD-10-CM | POA: Diagnosis not present

## 2018-01-26 DIAGNOSIS — I7410 Embolism and thrombosis of unspecified parts of aorta: Secondary | ICD-10-CM

## 2018-01-26 DIAGNOSIS — I700 Atherosclerosis of aorta: Secondary | ICD-10-CM

## 2018-01-26 DIAGNOSIS — D649 Anemia, unspecified: Secondary | ICD-10-CM | POA: Diagnosis not present

## 2018-01-26 DIAGNOSIS — I10 Essential (primary) hypertension: Secondary | ICD-10-CM | POA: Diagnosis not present

## 2018-01-26 NOTE — Patient Instructions (Signed)
-  Decrease metoprolol to 1/2 tablet per day

## 2018-01-26 NOTE — Progress Notes (Signed)
[name], male   DOB: 1961-05-29, 57 y.o.   MRN: [id number]   Subjective:    [name], male    DOB: 05-20-1961, 57 y.o.   MRN: [id number]  HPI  Patient here for a scheduled follow up.  Overall doing relatively well.  Does report fatigue.  Has been persistent.  Does still have some leg pain - life style limiting claudication.  Just saw vascular surgery.  Felt stable.  Recommended f/u in 05/2018.  No changes made.  No chest pain.  Breathing stable.  No cough or congestion.  Was questioning if some of his medication could be contributing to his fatigue.  Eating.  No abdominal pain.  Bowels moving.  Does have apthous ulcer.  Has Dukes mouthwash.     Past Medical History:  Diagnosis Date  . Basal cell carcinoma (BCC) of back   . Basal cell carcinoma (BCC) of nasal sidewall   . Dizziness   . GERD (gastroesophageal reflux disease)   . Hypercholesterolemia   . Hypertension    hx of HBP readings  . Metastatic squamous cell carcinoma ([place]) 2018   bx cervical lymph node   Past Surgical History:  Procedure Laterality Date  . COLONOSCOPY    . ESOPHAGOGASTRODUODENOSCOPY (EGD) WITH PROPOFOL N/A 05/24/2015   Procedure: ESOPHAGOGASTRODUODENOSCOPY (EGD) WITH PROPOFOL;  Surgeon: Manya Silvas, MD;  Location: [facility] ENDOSCOPY;  Service: Endoscopy;  Laterality: N/A;  . IR IMAGING GUIDED PORT INSERTION  12/09/2016  . SAVORY DILATION N/A 05/24/2015   Procedure: SAVORY DILATION;  Surgeon: Manya Silvas, MD;  Location: [facility] ENDOSCOPY;  Service: Endoscopy;  Laterality: N/A;   Family History  Problem Relation Age of Onset  . Hyperlipidemia Mother   . Heart disease Mother   . Hypertension Mother   . Alzheimer's disease Mother   . Prostate cancer Father   . Stroke Father    Social History   Socioeconomic History  . Marital status: Married    Spouse name: Not on file  . Number of children: Not on file  . Years of education: Not on file  . Highest education  level: Not on file  Occupational History  . Not on file  Social Needs  . Financial resource strain: Not on file  . Food insecurity:    Worry: Not on file    Inability: Not on file  . Transportation needs:    Medical: Not on file    Non-medical: Not on file  Tobacco Use  . Smoking status: Former Smoker    Last attempt to quit: 11/11/1996    Years since quitting: 21.2  . Smokeless tobacco: Never Used  Substance and Sexual Activity  . Alcohol use: No    Alcohol/week: 0.0 oz    Comment: none for 6 months  . Drug use: No  . Sexual activity: Yes  Lifestyle  . Physical activity:    Days per week: Not on file    Minutes per session: Not on file  . Stress: Not on file  Relationships  . Social connections:    Talks on phone: Not on file    Gets together: Not on file    Attends religious service: Not on file    Active member of club or organization: Not on file    Attends meetings of clubs or organizations: Not on file    Relationship status: Not on file  Other Topics Concern  . Not on file  Social History Narrative  .  Not on file    Outpatient Encounter Medications as of 01/26/2018  Medication Sig  . clopidogrel (PLAVIX) 75 MG tablet TAKE 1 TABLET (75 MG TOTAL) BY MOUTH ONCE DAILY.  Marland Kitchen ELIQUIS 5 MG TABS tablet 2 (two) times daily.   Marland Kitchen lisinopril (PRINIVIL,ZESTRIL) 20 MG tablet TAKE 1 TABLET BY MOUTH EVERY DAY  . metoprolol tartrate (LOPRESSOR) 25 MG tablet TAKE 1/2 TABLET (12.5MG ) BY MOUTH TWICE A DAY  . Multiple Vitamin (MULTIVITAMIN) capsule Take by mouth.  Marland Kitchen omeprazole (PRILOSEC) 20 MG capsule TAKE 1 CAPSULE (20 MG TOTAL) BY MOUTH 2 (TWO) TIMES DAILY. (Patient taking differently: Take 20 mg by mouth daily. )  . ondansetron (ZOFRAN) 4 MG tablet Take by mouth.  . pravastatin (PRAVACHOL) 10 MG tablet Take 1 tablet (10 mg total) by mouth daily.  . [DISCONTINUED] HYDROcodone-acetaminophen (NORCO/VICODIN) 5-325 MG tablet Take by mouth.  . [DISCONTINUED] lidocaine-prilocaine (EMLA)  cream Apply to affected area once (Patient not taking: Reported on 10/26/2017)  . [DISCONTINUED] lisinopril (PRINIVIL,ZESTRIL) 30 MG tablet TAKE 1 TABLET (30 MG TOTAL) BY MOUTH DAILY.   No facility-administered encounter medications on file as of 01/26/2018.     Review of Systems  Constitutional: Positive for fatigue. Negative for appetite change and unexpected weight change.  HENT: Negative for congestion and sinus pressure.   Respiratory: Negative for cough, chest tightness and shortness of breath.   Cardiovascular: Negative for chest pain, palpitations and leg swelling.  Gastrointestinal: Negative for abdominal pain, diarrhea, nausea and vomiting.  Genitourinary: Negative for difficulty urinating and dysuria.  Musculoskeletal: Negative for joint swelling and myalgias.       Leg pain - claudication.    Skin: Negative for color change and rash.  Neurological: Negative for dizziness, light-headedness and headaches.  Psychiatric/Behavioral: Negative for agitation and dysphoric mood.       Objective:    Physical Exam  Constitutional: He appears well-developed and well-nourished. No distress.  HENT:  Nose: Nose normal.  Mouth/Throat: Oropharynx is clear and moist.  Neck: Neck supple. No thyromegaly present.  Cardiovascular: Normal rate and regular rhythm.  Pulmonary/Chest: Effort normal and breath sounds normal. No respiratory distress.  Abdominal: Soft. Bowel sounds are normal. There is no tenderness.  Musculoskeletal: He exhibits no edema or tenderness.  Lymphadenopathy:    He has no cervical adenopathy.  Skin: No rash noted. No erythema.  Psychiatric: He has a normal mood and affect. His behavior is normal.    BP 124/64 (BP Location: Left Arm, Patient Position: Sitting, Cuff Size: Normal)   Pulse 82   Temp (!) 97.5 F (36.4 C) (Oral)   Resp 16   Wt 152 lb 12.8 oz (69.3 kg)   SpO2 99%   BMI 23.93 kg/m  Wt Readings from Last 3 Encounters:  01/26/18 152 lb 12.8 oz (69.3  kg)  10/26/17 153 lb 12.8 oz (69.8 kg)  08/23/17 149 lb 0.5 oz (67.6 kg)     Lab Results  Component Value Date   WBC 7.3 08/23/2017   HGB 13.0 08/23/2017   HCT 38.9 (L) 08/23/2017   PLT 419 08/23/2017   GLUCOSE 95 11/24/2017   CHOL 357 (H) 11/03/2017   TRIG 277.0 (H) 11/03/2017   HDL 42.00 11/03/2017   LDLDIRECT 234.0 11/03/2017   LDLCALC 202 (H) 04/20/2014   ALT 44 12/21/2017   AST 29 12/21/2017   NA 135 12/21/2017   K 4.2 11/24/2017   CL 98 11/24/2017   CREATININE 1.21 11/24/2017   BUN 18 11/24/2017  CO2 29 11/24/2017   TSH 1.322 08/23/2017   PSA 1.07 11/03/2017   INR 0.97 12/09/2016    Nm Pet Image Restag (ps) Skull Base To Thigh  Result Date: 05/18/2017 CLINICAL DATA:  Initial treatment strategy for head neck carcinoma. oropharyngeal carcinoma Post radiotherapy. EXAM: NUCLEAR MEDICINE PET SKULL BASE TO THIGH TECHNIQUE: 12.8 mCi F-18 FDG was injected intravenously. Full-ring PET imaging was performed from the skull base to thigh after the radiotracer. CT data was obtained and used for attenuation correction and anatomic localization. FASTING BLOOD GLUCOSE:  Value: 94 mg/dl COMPARISON:  PET-CT 12/07/2016 FINDINGS: NECK Decreased metabolic activity hypopharynx. Mild symmetric activity remains SUV max equal 3.5 decreased from the 5.1. Large hypermetabolic RIGHT level 2 lymph node has decreased in size and metabolic activity. Node measures 8 mm decreased in size from 29 mm and now has background metabolic activity decreased from SUV max equals 7.8. No new hypermetabolic lymph nodes in the neck. CHEST Port in the LEFT chest wall. No hypermetabolic mediastinal or hilar nodes. No suspicious pulmonary nodules on the CT scan. ABDOMEN/PELVIS No abnormal hypermetabolic activity within the liver, pancreas, adrenal glands, or spleen. No hypermetabolic lymph nodes in the abdomen or pelvis. Atherosclerotic calcification of the aorta.  Prostate normal SKELETON No focal hypermetabolic activity to  suggest skeletal metastasis. IMPRESSION: 1. Interval reduction of metabolic activity in the hypopharynx to near background level. 2. Resolution of metabolic activity in large RIGHT level 2 necrotic lymph node. 3. No evidence of active metastatic adenopathy in the neck. 4. No distant metastatic disease . Electronically Signed   By: Suzy Bouchard M.D.   On: 05/18/2017 09:04       Assessment & Plan:   Problem List Items Addressed This Visit    Aortic occlusion ([place])    S/p stent placement.  Followed by vascular surgery.        Essential hypertension, benign    Blood pressure under good control.  Continue same medication regimen.  Follow pressures.  Follow metabolic panel.        Relevant Orders   Basic metabolic panel   Fatigue    Increased fatigue as outlined.  Discussed probably multifactorial.  Is s/p chemo and XRT.  Also has lift style limiting claudication.  Limits his activity.  Check routine labs.  Also, will decrease metoprolol to 1/2 tablet q day.  Follow.        GERD (gastroesophageal reflux disease)    Controlled on omeprazole.        Hypercholesterolemia    Low cholesterol diet and exercise.  Follow lipid panel.        Relevant Orders   Hepatic function panel   Lipid panel   Neoplasm of oropharynx    S/p chemo and XRT.  Has f/u planned with oncology 02/2018.         Other Visit Diagnoses    Anemia, unspecified type    -  Primary   previous decreased hgb.  recheck cbc, iron studies and B12.     Relevant Orders   CBC with Differential/Platelet   Ferritin   Vitamin B12   IBC panel       Einar Pheasant, MD

## 2018-02-06 ENCOUNTER — Encounter: Payer: Self-pay | Admitting: Internal Medicine

## 2018-02-06 ENCOUNTER — Other Ambulatory Visit: Payer: Self-pay | Admitting: Internal Medicine

## 2018-02-06 DIAGNOSIS — R5383 Other fatigue: Secondary | ICD-10-CM | POA: Insufficient documentation

## 2018-02-06 NOTE — Assessment & Plan Note (Signed)
Low cholesterol diet and exercise.  Follow lipid panel.   

## 2018-02-06 NOTE — Assessment & Plan Note (Signed)
Controlled on omeprazole.   

## 2018-02-06 NOTE — Assessment & Plan Note (Signed)
S/p stent placement.  Followed by vascular surgery.  

## 2018-02-06 NOTE — Assessment & Plan Note (Signed)
S/p chemo and XRT.  Has f/u planned with oncology 02/2018.

## 2018-02-06 NOTE — Assessment & Plan Note (Signed)
Increased fatigue as outlined.  Discussed probably multifactorial.  Is s/p chemo and XRT.  Also has lift style limiting claudication.  Limits his activity.  Check routine labs.  Also, will decrease metoprolol to 1/2 tablet q day.  Follow.

## 2018-02-06 NOTE — Assessment & Plan Note (Signed)
Blood pressure under good control.  Continue same medication regimen.  Follow pressures.  Follow metabolic panel.   

## 2018-02-07 DIAGNOSIS — L820 Inflamed seborrheic keratosis: Secondary | ICD-10-CM | POA: Diagnosis not present

## 2018-02-07 DIAGNOSIS — C4442 Squamous cell carcinoma of skin of scalp and neck: Secondary | ICD-10-CM | POA: Diagnosis not present

## 2018-02-07 DIAGNOSIS — D044 Carcinoma in situ of skin of scalp and neck: Secondary | ICD-10-CM | POA: Diagnosis not present

## 2018-02-08 ENCOUNTER — Other Ambulatory Visit (INDEPENDENT_AMBULATORY_CARE_PROVIDER_SITE_OTHER): Payer: BLUE CROSS/BLUE SHIELD

## 2018-02-08 DIAGNOSIS — I10 Essential (primary) hypertension: Secondary | ICD-10-CM

## 2018-02-08 DIAGNOSIS — D649 Anemia, unspecified: Secondary | ICD-10-CM

## 2018-02-08 DIAGNOSIS — E7800 Pure hypercholesterolemia, unspecified: Secondary | ICD-10-CM | POA: Diagnosis not present

## 2018-02-08 LAB — CBC WITH DIFFERENTIAL/PLATELET
BASOS PCT: 1.3 %
Basophils Absolute: 87 cells/uL (ref 0–200)
EOS ABS: 80 {cells}/uL (ref 15–500)
Eosinophils Relative: 1.2 %
HEMATOCRIT: 37 % — AB (ref 38.5–50.0)
HEMOGLOBIN: 12.3 g/dL — AB (ref 13.2–17.1)
LYMPHS ABS: 3799 {cells}/uL (ref 850–3900)
MCH: 27.2 pg (ref 27.0–33.0)
MCHC: 33.2 g/dL (ref 32.0–36.0)
MCV: 81.7 fL (ref 80.0–100.0)
MONOS PCT: 9.7 %
MPV: 9.4 fL (ref 7.5–12.5)
NEUTROS ABS: 2084 {cells}/uL (ref 1500–7800)
Neutrophils Relative %: 31.1 %
Platelets: 342 10*3/uL (ref 140–400)
RBC: 4.53 10*6/uL (ref 4.20–5.80)
RDW: 14.8 % (ref 11.0–15.0)
Total Lymphocyte: 56.7 %
WBC: 6.7 10*3/uL (ref 3.8–10.8)
WBCMIX: 650 {cells}/uL (ref 200–950)

## 2018-02-08 LAB — HEPATIC FUNCTION PANEL
ALT: 67 U/L — ABNORMAL HIGH (ref 0–53)
AST: 44 U/L — ABNORMAL HIGH (ref 0–37)
Albumin: 4.2 g/dL (ref 3.5–5.2)
Alkaline Phosphatase: 75 U/L (ref 39–117)
BILIRUBIN DIRECT: 0 mg/dL (ref 0.0–0.3)
BILIRUBIN TOTAL: 0.3 mg/dL (ref 0.2–1.2)
Total Protein: 7.2 g/dL (ref 6.0–8.3)

## 2018-02-08 LAB — LIPID PANEL
CHOL/HDL RATIO: 8
Cholesterol: 255 mg/dL — ABNORMAL HIGH (ref 0–200)
HDL: 32.7 mg/dL — ABNORMAL LOW (ref >39.00)
NONHDL: 222.2
Triglycerides: 238 mg/dL — ABNORMAL HIGH (ref 0.0–149.0)
VLDL: 47.6 mg/dL — AB (ref 0.0–40.0)

## 2018-02-08 LAB — BASIC METABOLIC PANEL
BUN: 15 mg/dL (ref 6–23)
CALCIUM: 9.4 mg/dL (ref 8.4–10.5)
CO2: 25 mEq/L (ref 19–32)
Chloride: 103 mEq/L (ref 96–112)
Creatinine, Ser: 1.09 mg/dL (ref 0.40–1.50)
GFR: 73.95 mL/min (ref >60.00)
Glucose, Bld: 94 mg/dL (ref 70–99)
Potassium: 4.7 mEq/L (ref 3.5–5.1)
SODIUM: 137 meq/L (ref 135–145)

## 2018-02-08 LAB — IBC PANEL
Iron: 68 ug/dL (ref 42–165)
SATURATION RATIOS: 25.6 % (ref 20.0–50.0)
TRANSFERRIN: 190 mg/dL — AB (ref 212.0–360.0)

## 2018-02-08 LAB — LDL CHOLESTEROL, DIRECT: LDL DIRECT: 140 mg/dL

## 2018-02-08 LAB — FERRITIN: Ferritin: 162.1 ng/mL (ref 22.0–322.0)

## 2018-02-08 LAB — VITAMIN B12: Vitamin B-12: 572 pg/mL (ref 211–911)

## 2018-02-08 NOTE — Addendum Note (Signed)
Addended by: Arby Barrette on: 02/08/2018 08:49 AM   Modules accepted: Orders

## 2018-02-09 ENCOUNTER — Other Ambulatory Visit: Payer: Self-pay | Admitting: Internal Medicine

## 2018-02-09 DIAGNOSIS — R945 Abnormal results of liver function studies: Secondary | ICD-10-CM

## 2018-02-09 DIAGNOSIS — R7989 Other specified abnormal findings of blood chemistry: Secondary | ICD-10-CM

## 2018-02-09 MED ORDER — ELIQUIS 5 MG PO TABS: 5.0000 mg | ORAL_TABLET | Freq: Two times a day (BID) | ORAL | 6 refills | Status: DC

## 2018-02-09 NOTE — Telephone Encounter (Signed)
Eliquis refill Last Refill:02/05/17 no quantity noted- but there are 10 refills Last OV: 10/26/17 PCP: Dr Nicki Reaper Pharmacy:[facility]

## 2018-02-09 NOTE — Progress Notes (Signed)
Order placed for f/u lab.   

## 2018-02-18 ENCOUNTER — Other Ambulatory Visit (INDEPENDENT_AMBULATORY_CARE_PROVIDER_SITE_OTHER): Payer: BLUE CROSS/BLUE SHIELD

## 2018-02-18 DIAGNOSIS — R7989 Other specified abnormal findings of blood chemistry: Secondary | ICD-10-CM

## 2018-02-18 DIAGNOSIS — R945 Abnormal results of liver function studies: Secondary | ICD-10-CM

## 2018-02-18 LAB — HEPATIC FUNCTION PANEL
ALT: 54 U/L — AB (ref 0–53)
AST: 27 U/L (ref 0–37)
Albumin: 4.2 g/dL (ref 3.5–5.2)
Alkaline Phosphatase: 61 U/L (ref 39–117)
Bilirubin, Direct: 0.1 mg/dL (ref 0.0–0.3)
TOTAL PROTEIN: 7.2 g/dL (ref 6.0–8.3)
Total Bilirubin: 0.5 mg/dL (ref 0.2–1.2)

## 2018-02-21 ENCOUNTER — Inpatient Hospital Stay: Payer: BLUE CROSS/BLUE SHIELD | Attending: Oncology | Admitting: Oncology

## 2018-02-21 ENCOUNTER — Inpatient Hospital Stay: Payer: BLUE CROSS/BLUE SHIELD

## 2018-02-21 ENCOUNTER — Encounter: Payer: Self-pay | Admitting: Internal Medicine

## 2018-02-21 ENCOUNTER — Encounter: Payer: Self-pay | Admitting: Oncology

## 2018-02-21 VITALS — BP 125/82 | HR 89 | Temp 98.6°F | Resp 18 | Ht 67.0 in | Wt 154.4 lb

## 2018-02-21 DIAGNOSIS — Z7901 Long term (current) use of anticoagulants: Secondary | ICD-10-CM | POA: Diagnosis not present

## 2018-02-21 DIAGNOSIS — D490 Neoplasm of unspecified behavior of digestive system: Secondary | ICD-10-CM

## 2018-02-21 DIAGNOSIS — Z85828 Personal history of other malignant neoplasm of skin: Secondary | ICD-10-CM | POA: Diagnosis not present

## 2018-02-21 DIAGNOSIS — Z923 Personal history of irradiation: Secondary | ICD-10-CM | POA: Diagnosis not present

## 2018-02-21 DIAGNOSIS — Z9221 Personal history of antineoplastic chemotherapy: Secondary | ICD-10-CM | POA: Insufficient documentation

## 2018-02-21 DIAGNOSIS — Z79899 Other long term (current) drug therapy: Secondary | ICD-10-CM | POA: Insufficient documentation

## 2018-02-21 DIAGNOSIS — I700 Atherosclerosis of aorta: Secondary | ICD-10-CM | POA: Insufficient documentation

## 2018-02-21 DIAGNOSIS — I10 Essential (primary) hypertension: Secondary | ICD-10-CM | POA: Insufficient documentation

## 2018-02-21 DIAGNOSIS — Z87891 Personal history of nicotine dependence: Secondary | ICD-10-CM | POA: Diagnosis not present

## 2018-02-21 DIAGNOSIS — I2510 Atherosclerotic heart disease of native coronary artery without angina pectoris: Secondary | ICD-10-CM | POA: Insufficient documentation

## 2018-02-21 DIAGNOSIS — K117 Disturbances of salivary secretion: Secondary | ICD-10-CM | POA: Diagnosis not present

## 2018-02-21 DIAGNOSIS — Z85819 Personal history of malignant neoplasm of unspecified site of lip, oral cavity, and pharynx: Secondary | ICD-10-CM | POA: Diagnosis not present

## 2018-02-21 DIAGNOSIS — K219 Gastro-esophageal reflux disease without esophagitis: Secondary | ICD-10-CM | POA: Insufficient documentation

## 2018-02-21 DIAGNOSIS — G629 Polyneuropathy, unspecified: Secondary | ICD-10-CM | POA: Diagnosis not present

## 2018-02-21 LAB — CBC WITH DIFFERENTIAL/PLATELET
BASOS ABS: 0 10*3/uL (ref 0–0.1)
BASOS PCT: 1 %
EOS ABS: 0.2 10*3/uL (ref 0–0.7)
EOS PCT: 3 %
HCT: 40 % (ref 40.0–52.0)
Hemoglobin: 13.4 g/dL (ref 13.0–18.0)
Lymphocytes Relative: 49 %
Lymphs Abs: 3.1 10*3/uL (ref 1.0–3.6)
MCH: 27.4 pg (ref 26.0–34.0)
MCHC: 33.4 g/dL (ref 32.0–36.0)
MCV: 81.9 fL (ref 80.0–100.0)
MONO ABS: 0.7 10*3/uL (ref 0.2–1.0)
Monocytes Relative: 11 %
Neutro Abs: 2.3 10*3/uL (ref 1.4–6.5)
Neutrophils Relative %: 36 %
PLATELETS: 329 10*3/uL (ref 150–440)
RBC: 4.88 MIL/uL (ref 4.40–5.90)
RDW: 14.5 % (ref 11.5–14.5)
WBC: 6.3 10*3/uL (ref 3.8–10.6)

## 2018-02-21 LAB — COMPREHENSIVE METABOLIC PANEL
ALBUMIN: 4.2 g/dL (ref 3.5–5.0)
ALK PHOS: 68 U/L (ref 38–126)
ALT: 44 U/L (ref 0–44)
AST: 32 U/L (ref 15–41)
Anion gap: 11 (ref 5–15)
BUN: 22 mg/dL — AB (ref 6–20)
CALCIUM: 9.1 mg/dL (ref 8.9–10.3)
CO2: 23 mmol/L (ref 22–32)
Chloride: 100 mmol/L (ref 98–111)
Creatinine, Ser: 1.18 mg/dL (ref 0.61–1.24)
GFR calc Af Amer: >60 mL/min (ref >60)
GLUCOSE: 130 mg/dL — AB (ref 70–99)
Potassium: 4.4 mmol/L (ref 3.5–5.1)
Sodium: 134 mmol/L — ABNORMAL LOW (ref 135–145)
TOTAL PROTEIN: 7.9 g/dL (ref 6.5–8.1)
Total Bilirubin: 0.6 mg/dL (ref 0.3–1.2)

## 2018-02-21 LAB — TSH: TSH: 1.16 u[IU]/mL (ref 0.350–4.500)

## 2018-02-21 NOTE — Progress Notes (Signed)
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 Con Memos in March 2018 or neck mass which has been persistent for 1 month. The mass developed suddenly and was fairly asymptomatic. Patient underwent a comprehensive ENT exam which showed asymmetric and enlarged right tonsil. 2 x 4 cm level II mass was noted on exam. CT soft tissue neck on 10/30/2016 showed necrotic level II lymph node measuring 4.7 cm x 2.7 cm. Very slight tonsillar asymmetry on the right raises the possibility of an occult right tonsillar carcinoma. Patient underwent core biopsy of the cervical lymph node which showed metastatic squamous cell carcinoma P16 positive. Patient has been referred to [place] for further management.  PET/CT on 12/07/16 showed: 1. Isolated nodal metastasis in the right level 2 station. 2. Subtle asymmetric enlargement and hypermetabolism of the right palatine tonsil . This likely corresponds to the primary lesion detailed on clinic notes. 3. No extracervical disease identified. 4. Age advanced coronary artery atherosclerosis. Recommend assessment of coronary risk factors and consideration of medical therapy. 5. Aortic atherosclerosis.  Baseline audiogram on 12/14/2016 showed mild sensorineural hearing loss in his right ear and mild-to-moderate sensorineural hearing loss in his left ear. Decreased hearing as compared to 2010  Patient  received 2 weekly doses of cisplatin concurrent with RT. Following this patient developed acute aortic thrombosis  CT abdomen angiogram on 01/07/17 showed: Impression: 1. Occlusion of the infrarenal abdominal aorta just superior to the bifurcation extending into bilateral common iliac arteries. There is reconstitution of flow in the right common iliac artery and left external iliac artery 2. Occlusion of the left popliteal artery superior the knee. Distal reconstitution of flow is seen in the posterior tibial artery with flow visualized the foot. 3. Nonvisualization of flow in the right anterior tibial artery just distal to takeoff. Right posterior tibial artery is seen to the level of the ankle. 4. There is occlusion of the left femoral profunda artery just distal to its takeoff 5. Moderate stenosis of the right popliteal artery inferior to the knee at the level of the trifurcation of the calf arteries.  He underwent EMBOLECTOMY/THROMBECTOMY AORTOILIAC ARTERY BY LEG INCISION Bilateral 01/09/2017. BYPASS GRAFT FEMORAL-FEMORAL Bilateral 01/09/2017   He resumed radiation therapy only after 2-3 weeks time and did not wish to proceed with chemotherapy. He continued to do poorly in terms of fatigue, weight loss and complications of aortic occlusion. Radiation therapy was cut short by 1 week  PET/CT scan 12 weeks post treatment showed no residual hypermetabolism  Interval history- taste sensation is still not back. Xerostomia is improving gradually. Appetite is good. No weight loss. He follows up with vascular surgery for aortic occlusive disease.   ECOG PS- 1 Pain scale- 0   Review of systems- Review of Systems  Constitutional: Negative for chills, fever, malaise/fatigue and weight loss.  HENT: Negative for congestion, ear discharge and nosebleeds.   Eyes: Negative for blurred vision.  Respiratory: Negative for cough, hemoptysis, sputum production, shortness of breath and wheezing.  Cardiovascular: Negative for chest pain, palpitations, orthopnea and claudication.  Gastrointestinal: Negative for abdominal pain, blood in stool, constipation, diarrhea, heartburn, melena, nausea and vomiting.  Genitourinary: Negative for dysuria, flank pain, frequency, hematuria and urgency.  Musculoskeletal: Negative for back pain, joint pain and myalgias.  Skin: Negative for rash.  Neurological: Negative for dizziness, tingling, focal weakness, seizures, weakness and headaches.  Endo/Heme/Allergies: Does not bruise/bleed easily.  Psychiatric/Behavioral: Negative for depression and suicidal ideas. The patient does not have insomnia.       No Known Allergies   Past Medical History:  Diagnosis Date  . Basal cell carcinoma (BCC) of back   . Basal cell carcinoma (BCC) of nasal sidewall   . Dizziness   . GERD (gastroesophageal reflux disease)   . Hypercholesterolemia   . Hypertension    hx of HBP readings  . Metastatic squamous cell carcinoma ([place]) 2018   bx cervical lymph node     Past Surgical History:  Procedure Laterality Date  . COLONOSCOPY    . ESOPHAGOGASTRODUODENOSCOPY (EGD) WITH PROPOFOL N/A 05/24/2015   Procedure: ESOPHAGOGASTRODUODENOSCOPY (EGD) WITH PROPOFOL;  Surgeon: Manya Silvas, MD;  Location[facility] ENDOSCOPY;  Service: Endoscopy;  Laterality: N/A;  . IR IMAGING GUIDED PORT INSERTION  12/09/2016  . SAVORY DILATION N/A 05/24/2015   Procedure: SAVORY DILATION;  Surgeon: Manya Silvas, MD;  Location: [facility] ENDOSCOPY;  Service: Endoscopy;  Laterality: N/A;    Social History   Socioeconomic History  . Marital status: Married    Spouse name: Not on file  . Number of children: Not on file  . Years of education: Not on file  . Highest education level: Not on file  Occupational History  . Not on file  Social Needs  . Financial resource strain: Not on file  . Food insecurity:    Worry: Not on file    Inability: Not on file  . Transportation needs:     Medical: Not on file    Non-medical: Not on file  Tobacco Use  . Smoking status: Former Smoker    Last attempt to quit: 11/11/1996    Years since quitting: 21.2  . Smokeless tobacco: Never Used  Substance and Sexual Activity  . Alcohol use: No    Alcohol/week: 0.0 oz    Comment: none for 6 months  . Drug use: No  . Sexual activity: Yes  Lifestyle  . Physical activity:    Days per week: Not on file    Minutes per session: Not on file  . Stress: Not on file  Relationships  . Social connections:    Talks on phone: Not on file    Gets together: Not on file    Attends religious service: Not on file    Active member of club or organization: Not on file    Attends meetings of clubs or organizations: Not on file    Relationship status: Not on file  . Intimate partner violence:    Fear of current or ex partner: Not on file    Emotionally abused: Not on file    Physically abused: Not on file    Forced sexual activity: Not on file  Other Topics Concern  . Not on file  Social History Narrative  . Not on file    Family History  Problem Relation Age of Onset  . Hyperlipidemia Mother   . Heart disease Mother   . Hypertension Mother   . Alzheimer's disease Mother   . Prostate cancer Father   .  Stroke Father      Current Outpatient Medications:  .  clopidogrel (PLAVIX) 75 MG tablet, TAKE 1 TABLET (75 MG TOTAL) BY MOUTH ONCE DAILY., Disp: 30 tablet, Rfl: 11 .  ELIQUIS 5 MG TABS tablet, Take 1 tablet (5 mg total) by mouth 2 (two) times daily., Disp: 60 tablet, Rfl: 6 .  lisinopril (PRINIVIL,ZESTRIL) 20 MG tablet, TAKE 1 TABLET BY MOUTH EVERY DAY, Disp: 30 tablet, Rfl: 11 .  metoprolol tartrate (LOPRESSOR) 25 MG tablet, TAKE 1/2 TABLET (12.5MG ) BY MOUTH TWICE A DAY, Disp: 30 tablet, Rfl: 11 .  Multiple Vitamin (MULTIVITAMIN) capsule, Take by mouth., Disp: , Rfl:  .  omeprazole (PRILOSEC) 20 MG capsule, TAKE 1 CAPSULE (20 MG TOTAL) BY MOUTH 2 (TWO) TIMES DAILY., Disp: 180 capsule,  Rfl: 2 .  ondansetron (ZOFRAN) 4 MG tablet, Take by mouth., Disp: , Rfl:  .  pravastatin (PRAVACHOL) 10 MG tablet, Take 1 tablet (10 mg total) by mouth daily., Disp: 90 tablet, Rfl: 1  Physical exam: There were no vitals filed for this visit. Physical Exam  Constitutional: He is oriented to person, place, and time. He appears well-nourished.  Thin man in no acute distress  HENT:  Head: Normocephalic and atraumatic.  Mouth/Throat: Oropharynx is clear and moist.  Eyes: Pupils are equal, round, and reactive to light. EOM are normal.  Neck: Normal range of motion.  Cardiovascular: Normal rate, regular rhythm and normal heart sounds.  Pulmonary/Chest: Effort normal and breath sounds normal.  Abdominal: Soft. Bowel sounds are normal.  Lymphadenopathy:  No palpable cervical adenopathy  Neurological: He is alert and oriented to person, place, and time.  Skin: Skin is warm and dry.     CMP Latest Ref Rng & Units 02/21/2018  Glucose 70 - 99 mg/dL 130(H)  BUN 6 - 20 mg/dL 22(H)  Creatinine 0.61 - 1.24 mg/dL 1.18  Sodium 135 - 145 mmol/L 134(L)  Potassium 3.5 - 5.1 mmol/L 4.4  Chloride 98 - 111 mmol/L 100  CO2 22 - 32 mmol/L 23  Calcium 8.9 - 10.3 mg/dL 9.1  Total Protein 6.5 - 8.1 g/dL 7.9  Total Bilirubin 0.3 - 1.2 mg/dL 0.6  Alkaline Phos 38 - 126 U/L 68  AST 15 - 41 U/L 32  ALT 0 - 44 U/L 44   CBC Latest Ref Rng & Units 02/21/2018  WBC 3.8 - 10.6 K/uL 6.3  Hemoglobin 13.0 - 18.0 g/dL 13.4  Hematocrit 40.0 - 52.0 % 40.0  Platelets 150 - 440 K/uL 329      Assessment and plan- Patient is a 57 y.o. 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 He is here for routine f/u  Clinically he is doing well. No evidence of recurrence on todays exam. He continues to follow up with Dr. Con Memos every 3 months. I will see him back in 6 months. No labs  He continues to have problems with xerostomia and  lack of taste since initial treatment. It has been about a year since he completed treatment and these are likely to persist  Aortic occlusive disease- being followed by vascular surgery. On plavix and eliquis. Has neuropathy secondary to it which is stable.   Visit Diagnosis 1. Neoplasm of oropharynx      Dr. Randa Evens, MD, MPH [facility] at [facility] [phone] 02/21/2018 10:23 AM

## 2018-02-21 NOTE — Progress Notes (Signed)
No new changes noted today 

## 2018-02-22 NOTE — Telephone Encounter (Signed)
Mailed to patient

## 2018-05-23 ENCOUNTER — Ambulatory Visit: Payer: BLUE CROSS/BLUE SHIELD | Admitting: Internal Medicine

## 2018-05-26 ENCOUNTER — Ambulatory Visit: Payer: BLUE CROSS/BLUE SHIELD | Admitting: Internal Medicine

## 2018-05-26 ENCOUNTER — Encounter: Payer: Self-pay | Admitting: Internal Medicine

## 2018-05-26 DIAGNOSIS — I10 Essential (primary) hypertension: Secondary | ICD-10-CM | POA: Diagnosis not present

## 2018-05-26 DIAGNOSIS — Z85819 Personal history of malignant neoplasm of unspecified site of lip, oral cavity, and pharynx: Secondary | ICD-10-CM

## 2018-05-26 DIAGNOSIS — K219 Gastro-esophageal reflux disease without esophagitis: Secondary | ICD-10-CM | POA: Diagnosis not present

## 2018-05-26 DIAGNOSIS — M25512 Pain in left shoulder: Secondary | ICD-10-CM

## 2018-05-26 DIAGNOSIS — R5383 Other fatigue: Secondary | ICD-10-CM | POA: Diagnosis not present

## 2018-05-26 DIAGNOSIS — E7800 Pure hypercholesterolemia, unspecified: Secondary | ICD-10-CM

## 2018-05-26 NOTE — Progress Notes (Signed)
Patient ID: CARNELIUS HAMMITT, male   DOB: 1961-01-10, 57 y.o.   MRN: [id number]   Subjective:    Patient ID: WYNN ALLDREDGE, male    DOB: 1961-06-06, 57 y.o.   MRN: [id number]  HPI  Patient here for a scheduled follow up. He reports he is doing relatively well.  Has had some increased shoulder pain and limited rom recently.  Injured his shoulder.  Heard something pop. Since then, has had increased pain especially with abduction above 90 degrees.  Last visit, metoprolol was decreased to 1/2 tablet per day.  Still with some fatigue, but overall he feels he is doing relatively well.  No chest pain.  No sob.  He started having some bleeding in his gums.  He is on plavix and eliquis.  He is on omeprazole and was questioning if the omeprazole was contributing.  He tried stopping the omeprazole and had return of acid reflux. (significant acid reflux).  Back on omeprazole now and doing better.  No swallowing problems.  No abdominal pain.  Bowels moving.     Past Medical History:  Diagnosis Date  . Basal cell carcinoma (BCC) of back   . Basal cell carcinoma (BCC) of nasal sidewall   . Dizziness   . GERD (gastroesophageal reflux disease)   . Hypercholesterolemia   . Hypertension    hx of HBP readings  . Metastatic squamous cell carcinoma ([place]) 2018   bx cervical lymph node   Past Surgical History:  Procedure Laterality Date  . COLONOSCOPY    . ESOPHAGOGASTRODUODENOSCOPY (EGD) WITH PROPOFOL N/A 05/24/2015   Procedure: ESOPHAGOGASTRODUODENOSCOPY (EGD) WITH PROPOFOL;  Surgeon: Manya Silvas, MD;  Location: [facility] ENDOSCOPY;  Service: Endoscopy;  Laterality: N/A;  . IR IMAGING GUIDED PORT INSERTION  12/09/2016  . SAVORY DILATION N/A 05/24/2015   Procedure: SAVORY DILATION;  Surgeon: Manya Silvas, MD;  Location: [facility] ENDOSCOPY;  Service: Endoscopy;  Laterality: N/A;   Family History  Problem Relation Age of Onset  . Hyperlipidemia Mother   . Heart disease Mother   . Hypertension Mother   .  Alzheimer's disease Mother   . Prostate cancer Father   . Stroke Father    Social History   Socioeconomic History  . Marital status: Married    Spouse name: Not on file  . Number of children: Not on file  . Years of education: Not on file  . Highest education level: Not on file  Occupational History  . Not on file  Social Needs  . Financial resource strain: Not on file  . Food insecurity:    Worry: Not on file    Inability: Not on file  . Transportation needs:    Medical: Not on file    Non-medical: Not on file  Tobacco Use  . Smoking status: Former Smoker    Last attempt to quit: 11/11/1996    Years since quitting: 21.5  . Smokeless tobacco: Never Used  Substance and Sexual Activity  . Alcohol use: No    Alcohol/week: 0.0 standard drinks    Comment: none for 6 months  . Drug use: No  . Sexual activity: Yes  Lifestyle  . Physical activity:    Days per week: Not on file    Minutes per session: Not on file  . Stress: Not on file  Relationships  . Social connections:    Talks on phone: Not on file    Gets together: Not on file    Attends religious service: Not  on file    Active member of club or organization: Not on file    Attends meetings of clubs or organizations: Not on file    Relationship status: Not on file  Other Topics Concern  . Not on file  Social History Narrative  . Not on file    Outpatient Encounter Medications as of 05/26/2018  Medication Sig  . clopidogrel (PLAVIX) 75 MG tablet TAKE 1 TABLET (75 MG TOTAL) BY MOUTH ONCE DAILY.  Marland Kitchen ELIQUIS 5 MG TABS tablet Take 1 tablet (5 mg total) by mouth 2 (two) times daily.  Marland Kitchen lisinopril (PRINIVIL,ZESTRIL) 20 MG tablet TAKE 1 TABLET BY MOUTH EVERY DAY  . metoprolol tartrate (LOPRESSOR) 25 MG tablet TAKE 1/2 TABLET (12.5MG ) BY MOUTH TWICE A DAY  . Multiple Vitamin (MULTIVITAMIN) capsule Take by mouth.  Marland Kitchen omeprazole (PRILOSEC) 20 MG capsule TAKE 1 CAPSULE (20 MG TOTAL) BY MOUTH 2 (TWO) TIMES DAILY.  .  pravastatin (PRAVACHOL) 10 MG tablet Take 1 tablet (10 mg total) by mouth daily.  . [DISCONTINUED] ondansetron (ZOFRAN) 4 MG tablet Take by mouth.   No facility-administered encounter medications on file as of 05/26/2018.     Review of Systems  Constitutional: Positive for fatigue. Negative for appetite change and unexpected weight change.  HENT: Negative for congestion and sinus pressure.        Gums bleeding.    Respiratory: Negative for cough, chest tightness and shortness of breath.   Cardiovascular: Negative for chest pain, palpitations and leg swelling.  Gastrointestinal: Negative for abdominal pain, diarrhea, nausea and vomiting.       Acid reflux better since back on omeprazole.    Genitourinary: Negative for difficulty urinating and dysuria.  Musculoskeletal: Negative for joint swelling and myalgias.       Left shoulder pain as outlined.    Skin: Negative for color change and rash.  Neurological: Negative for dizziness, light-headedness and headaches.  Psychiatric/Behavioral: Negative for agitation and dysphoric mood.       Objective:    Physical Exam  Constitutional: He appears well-developed and well-nourished. No distress.  HENT:  Nose: Nose normal.  Mouth/Throat: Oropharynx is clear and moist.  Neck: Neck supple.  Cardiovascular: Normal rate and regular rhythm.  Pulmonary/Chest: Effort normal and breath sounds normal. No respiratory distress.  Abdominal: Soft. Bowel sounds are normal. There is no tenderness.  Musculoskeletal: He exhibits no edema or tenderness.  Increased pain left shoulder - especially with abduction at or above 90 degrees.    Lymphadenopathy:    He has no cervical adenopathy.  Skin: No rash noted. No erythema.  Psychiatric: He has a normal mood and affect. His behavior is normal.    BP 128/82 (BP Location: Left Arm, Patient Position: Sitting, Cuff Size: Normal)   Pulse 81   Temp 97.9 F (36.6 C) (Oral)   Resp 15   Ht 5\' 7"  (1.702 m)   Wt  156 lb 4 oz (70.9 kg)   SpO2 98%   BMI 24.47 kg/m  Wt Readings from Last 3 Encounters:  05/26/18 156 lb 4 oz (70.9 kg)  02/21/18 154 lb 6.9 oz (70.1 kg)  01/26/18 152 lb 12.8 oz (69.3 kg)     Lab Results  Component Value Date   WBC 6.3 02/21/2018   HGB 13.4 02/21/2018   HCT 40.0 02/21/2018   PLT 329 02/21/2018   GLUCOSE 130 (H) 02/21/2018   CHOL 255 (H) 02/08/2018   TRIG 238.0 (H) 02/08/2018   HDL 32.70 (L) 02/08/2018  LDLDIRECT 140.0 02/08/2018   LDLCALC 202 (H) 04/20/2014   ALT 44 02/21/2018   AST 32 02/21/2018   NA 134 (L) 02/21/2018   K 4.4 02/21/2018   CL 100 02/21/2018   CREATININE 1.18 02/21/2018   BUN 22 (H) 02/21/2018   CO2 23 02/21/2018   TSH 1.160 02/21/2018   PSA 1.07 11/03/2017   INR 0.97 12/09/2016    Nm Pet Image Restag (ps) Skull Base To Thigh  Result Date: 05/18/2017 CLINICAL DATA:  Initial treatment strategy for head neck carcinoma. oropharyngeal carcinoma Post radiotherapy. EXAM: NUCLEAR MEDICINE PET SKULL BASE TO THIGH TECHNIQUE: 12.8 mCi F-18 FDG was injected intravenously. Full-ring PET imaging was performed from the skull base to thigh after the radiotracer. CT data was obtained and used for attenuation correction and anatomic localization. FASTING BLOOD GLUCOSE:  Value: 94 mg/dl COMPARISON:  PET-CT 12/07/2016 FINDINGS: NECK Decreased metabolic activity hypopharynx. Mild symmetric activity remains SUV max equal 3.5 decreased from the 5.1. Large hypermetabolic RIGHT level 2 lymph node has decreased in size and metabolic activity. Node measures 8 mm decreased in size from 29 mm and now has background metabolic activity decreased from SUV max equals 7.8. No new hypermetabolic lymph nodes in the neck. CHEST Port in the LEFT chest wall. No hypermetabolic mediastinal or hilar nodes. No suspicious pulmonary nodules on the CT scan. ABDOMEN/PELVIS No abnormal hypermetabolic activity within the liver, pancreas, adrenal glands, or spleen. No hypermetabolic lymph  nodes in the abdomen or pelvis. Atherosclerotic calcification of the aorta.  Prostate normal SKELETON No focal hypermetabolic activity to suggest skeletal metastasis. IMPRESSION: 1. Interval reduction of metabolic activity in the hypopharynx to near background level. 2. Resolution of metabolic activity in large RIGHT level 2 necrotic lymph node. 3. No evidence of active metastatic adenopathy in the neck. 4. No distant metastatic disease . Electronically Signed   By: Suzy Bouchard M.D.   On: 05/18/2017 09:04       Assessment & Plan:   Problem List Items Addressed This Visit    Essential hypertension, benign    Blood pressure under good control.  Continue same medication regimen.  Follow pressures.  Follow metabolic panel.        Relevant Orders   Basic metabolic panel   CBC with Differential/Platelet   Fatigue    Decreased metoprolol last visit.  Overall feels things are stable.  Follow.        GERD (gastroesophageal reflux disease)    Back on omeprazole.  Doing better.  Continue on omeprazole.  Follow.  Has bleeding gums - on eliquis and plavix.  Has seen dentist.  States gums ok.  Follow. No bleeding now.        History of malignant neoplasm of oropharynx    S/p chemo and XRT.  Continues to follow with oncology.        Relevant Orders   Protime-INR   Hypercholesterolemia    Low cholesterol diet and exercise.  Follow lipid panel.        Relevant Orders   Hepatic function panel   Lipid panel   Left shoulder pain    Persistent pain in left shoulder s/p injury.  States heard something pop.  Limited rom as outlined.  Question of rotator cuff injury, etc.  Refer to ortho for further evaluation and treatment.        Relevant Orders   Ambulatory referral to Orthopedic Surgery       Einar Pheasant, MD

## 2018-05-29 ENCOUNTER — Encounter: Payer: Self-pay | Admitting: Internal Medicine

## 2018-05-29 DIAGNOSIS — M25512 Pain in left shoulder: Secondary | ICD-10-CM | POA: Insufficient documentation

## 2018-05-29 NOTE — Assessment & Plan Note (Signed)
Low cholesterol diet and exercise.  Follow lipid panel.   

## 2018-05-29 NOTE — Assessment & Plan Note (Signed)
Persistent pain in left shoulder s/p injury.  States heard something pop.  Limited rom as outlined.  Question of rotator cuff injury, etc.  Refer to ortho for further evaluation and treatment.

## 2018-05-29 NOTE — Assessment & Plan Note (Signed)
Blood pressure under good control.  Continue same medication regimen.  Follow pressures.  Follow metabolic panel.   

## 2018-05-29 NOTE — Assessment & Plan Note (Signed)
S/p chemo and XRT.  Continues to follow with oncology.

## 2018-05-29 NOTE — Assessment & Plan Note (Signed)
Back on omeprazole.  Doing better.  Continue on omeprazole.  Follow.  Has bleeding gums - on eliquis and plavix.  Has seen dentist.  States gums ok.  Follow. No bleeding now.

## 2018-05-29 NOTE — Assessment & Plan Note (Signed)
Decreased metoprolol last visit.  Overall feels things are stable.  Follow.

## 2018-06-02 ENCOUNTER — Other Ambulatory Visit (INDEPENDENT_AMBULATORY_CARE_PROVIDER_SITE_OTHER): Payer: BLUE CROSS/BLUE SHIELD

## 2018-06-02 DIAGNOSIS — Z85819 Personal history of malignant neoplasm of unspecified site of lip, oral cavity, and pharynx: Secondary | ICD-10-CM | POA: Diagnosis not present

## 2018-06-02 DIAGNOSIS — E7800 Pure hypercholesterolemia, unspecified: Secondary | ICD-10-CM

## 2018-06-02 DIAGNOSIS — I10 Essential (primary) hypertension: Secondary | ICD-10-CM

## 2018-06-02 LAB — LIPID PANEL
CHOL/HDL RATIO: 8
Cholesterol: 301 mg/dL — ABNORMAL HIGH (ref 0–200)
HDL: 38.4 mg/dL — ABNORMAL LOW (ref >39.00)
LDL Cholesterol: 225 mg/dL — ABNORMAL HIGH (ref 0–99)
NONHDL: 263.09
TRIGLYCERIDES: 190 mg/dL — AB (ref 0.0–149.0)
VLDL: 38 mg/dL (ref 0.0–40.0)

## 2018-06-02 LAB — BASIC METABOLIC PANEL
BUN: 22 mg/dL (ref 6–23)
CALCIUM: 10 mg/dL (ref 8.4–10.5)
CO2: 27 meq/L (ref 19–32)
CREATININE: 1.26 mg/dL (ref 0.40–1.50)
Chloride: 99 mEq/L (ref 96–112)
GFR: 62.49 mL/min (ref >60.00)
GLUCOSE: 106 mg/dL — AB (ref 70–99)
Potassium: 4.9 mEq/L (ref 3.5–5.1)
Sodium: 135 mEq/L (ref 135–145)

## 2018-06-02 LAB — CBC WITH DIFFERENTIAL/PLATELET
BASOS ABS: 0 10*3/uL (ref 0.0–0.1)
Basophils Relative: 0.6 % (ref 0.0–3.0)
EOS PCT: 3.9 % (ref 0.0–5.0)
Eosinophils Absolute: 0.2 10*3/uL (ref 0.0–0.7)
HEMATOCRIT: 39.9 % (ref 39.0–52.0)
Hemoglobin: 13.1 g/dL (ref 13.0–17.0)
LYMPHS PCT: 31 % (ref 12.0–46.0)
Lymphs Abs: 1.9 10*3/uL (ref 0.7–4.0)
MCHC: 32.9 g/dL (ref 30.0–36.0)
MCV: 82.3 fl (ref 78.0–100.0)
MONOS PCT: 10.8 % (ref 3.0–12.0)
Monocytes Absolute: 0.7 10*3/uL (ref 0.1–1.0)
NEUTROS ABS: 3.3 10*3/uL (ref 1.4–7.7)
Neutrophils Relative %: 53.7 % (ref 43.0–77.0)
PLATELETS: 356 10*3/uL (ref 150.0–400.0)
RBC: 4.84 Mil/uL (ref 4.22–5.81)
RDW: 14.9 % (ref 11.5–15.5)
WBC: 6.1 10*3/uL (ref 4.0–10.5)

## 2018-06-02 LAB — HEPATIC FUNCTION PANEL
ALK PHOS: 60 U/L (ref 39–117)
ALT: 38 U/L (ref 0–53)
AST: 29 U/L (ref 0–37)
Albumin: 4.5 g/dL (ref 3.5–5.2)
BILIRUBIN DIRECT: 0.1 mg/dL (ref 0.0–0.3)
Total Bilirubin: 0.4 mg/dL (ref 0.2–1.2)
Total Protein: 7.9 g/dL (ref 6.0–8.3)

## 2018-06-02 LAB — PROTIME-INR
INR: 1.1 ratio — AB (ref 0.8–1.0)
Prothrombin Time: 13.4 s — ABNORMAL HIGH (ref 9.6–13.1)

## 2018-06-08 ENCOUNTER — Other Ambulatory Visit: Payer: Self-pay | Admitting: Unknown Physician Specialty

## 2018-06-08 DIAGNOSIS — R221 Localized swelling, mass and lump, neck: Secondary | ICD-10-CM

## 2018-06-08 DIAGNOSIS — Z85818 Personal history of malignant neoplasm of other sites of lip, oral cavity, and pharynx: Secondary | ICD-10-CM | POA: Diagnosis not present

## 2018-06-09 DIAGNOSIS — I70219 Atherosclerosis of native arteries of extremities with intermittent claudication, unspecified extremity: Secondary | ICD-10-CM | POA: Diagnosis not present

## 2018-06-09 DIAGNOSIS — I739 Peripheral vascular disease, unspecified: Secondary | ICD-10-CM | POA: Diagnosis not present

## 2018-06-09 DIAGNOSIS — Z923 Personal history of irradiation: Secondary | ICD-10-CM | POA: Diagnosis not present

## 2018-06-13 ENCOUNTER — Ambulatory Visit: Payer: BLUE CROSS/BLUE SHIELD

## 2018-08-22 ENCOUNTER — Ambulatory Visit: Payer: BLUE CROSS/BLUE SHIELD | Admitting: Oncology

## 2018-09-06 DIAGNOSIS — C44612 Basal cell carcinoma of skin of right upper limb, including shoulder: Secondary | ICD-10-CM | POA: Diagnosis not present

## 2018-09-06 DIAGNOSIS — L570 Actinic keratosis: Secondary | ICD-10-CM | POA: Diagnosis not present

## 2018-09-06 DIAGNOSIS — D485 Neoplasm of uncertain behavior of skin: Secondary | ICD-10-CM | POA: Diagnosis not present

## 2018-09-06 DIAGNOSIS — C44519 Basal cell carcinoma of skin of other part of trunk: Secondary | ICD-10-CM | POA: Diagnosis not present

## 2018-09-06 DIAGNOSIS — D2272 Melanocytic nevi of left lower limb, including hip: Secondary | ICD-10-CM | POA: Diagnosis not present

## 2018-09-06 DIAGNOSIS — D2262 Melanocytic nevi of left upper limb, including shoulder: Secondary | ICD-10-CM | POA: Diagnosis not present

## 2018-09-06 DIAGNOSIS — Z85820 Personal history of malignant melanoma of skin: Secondary | ICD-10-CM | POA: Diagnosis not present

## 2018-09-06 DIAGNOSIS — X32XXXA Exposure to sunlight, initial encounter: Secondary | ICD-10-CM | POA: Diagnosis not present

## 2018-09-06 DIAGNOSIS — Z85828 Personal history of other malignant neoplasm of skin: Secondary | ICD-10-CM | POA: Diagnosis not present

## 2018-09-08 DIAGNOSIS — C44612 Basal cell carcinoma of skin of right upper limb, including shoulder: Secondary | ICD-10-CM | POA: Diagnosis not present

## 2018-09-08 DIAGNOSIS — M9901 Segmental and somatic dysfunction of cervical region: Secondary | ICD-10-CM | POA: Diagnosis not present

## 2018-09-08 DIAGNOSIS — C44519 Basal cell carcinoma of skin of other part of trunk: Secondary | ICD-10-CM | POA: Diagnosis not present

## 2018-09-08 DIAGNOSIS — M9905 Segmental and somatic dysfunction of pelvic region: Secondary | ICD-10-CM | POA: Diagnosis not present

## 2018-09-08 DIAGNOSIS — M5412 Radiculopathy, cervical region: Secondary | ICD-10-CM | POA: Diagnosis not present

## 2018-09-08 DIAGNOSIS — M9903 Segmental and somatic dysfunction of lumbar region: Secondary | ICD-10-CM | POA: Diagnosis not present

## 2018-09-08 DIAGNOSIS — D0461 Carcinoma in situ of skin of right upper limb, including shoulder: Secondary | ICD-10-CM | POA: Diagnosis not present

## 2018-09-16 DIAGNOSIS — M9901 Segmental and somatic dysfunction of cervical region: Secondary | ICD-10-CM | POA: Diagnosis not present

## 2018-09-16 DIAGNOSIS — M5412 Radiculopathy, cervical region: Secondary | ICD-10-CM | POA: Diagnosis not present

## 2018-09-16 DIAGNOSIS — M9905 Segmental and somatic dysfunction of pelvic region: Secondary | ICD-10-CM | POA: Diagnosis not present

## 2018-09-16 DIAGNOSIS — M9903 Segmental and somatic dysfunction of lumbar region: Secondary | ICD-10-CM | POA: Diagnosis not present

## 2018-09-19 DIAGNOSIS — M9903 Segmental and somatic dysfunction of lumbar region: Secondary | ICD-10-CM | POA: Diagnosis not present

## 2018-09-19 DIAGNOSIS — M9905 Segmental and somatic dysfunction of pelvic region: Secondary | ICD-10-CM | POA: Diagnosis not present

## 2018-09-19 DIAGNOSIS — M5412 Radiculopathy, cervical region: Secondary | ICD-10-CM | POA: Diagnosis not present

## 2018-09-19 DIAGNOSIS — M9901 Segmental and somatic dysfunction of cervical region: Secondary | ICD-10-CM | POA: Diagnosis not present

## 2018-09-21 ENCOUNTER — Ambulatory Visit: Payer: BLUE CROSS/BLUE SHIELD | Admitting: Internal Medicine

## 2018-09-21 DIAGNOSIS — I10 Essential (primary) hypertension: Secondary | ICD-10-CM

## 2018-09-21 DIAGNOSIS — I700 Atherosclerosis of aorta: Secondary | ICD-10-CM

## 2018-09-21 DIAGNOSIS — Z85819 Personal history of malignant neoplasm of unspecified site of lip, oral cavity, and pharynx: Secondary | ICD-10-CM

## 2018-09-21 DIAGNOSIS — E7800 Pure hypercholesterolemia, unspecified: Secondary | ICD-10-CM | POA: Diagnosis not present

## 2018-09-21 DIAGNOSIS — K219 Gastro-esophageal reflux disease without esophagitis: Secondary | ICD-10-CM

## 2018-09-21 DIAGNOSIS — I7410 Embolism and thrombosis of unspecified parts of aorta: Secondary | ICD-10-CM

## 2018-09-21 DIAGNOSIS — R42 Dizziness and giddiness: Secondary | ICD-10-CM

## 2018-09-21 DIAGNOSIS — R400 Somnolence: Secondary | ICD-10-CM

## 2018-09-21 NOTE — Progress Notes (Signed)
[name], male   DOB: 04/02/1961, 58 y.o.   MRN: [id number]   Subjective:    [name], male    DOB: 10-Jun-1961, 58 y.o.   MRN: [id number]  HPI  Patient here for a scheduled follow up.  He reports he is doing relatively well.  He does report that he has had some intermittent dizziness over the past 2-3 weeks.  Has experienced this before.  May last 30 seconds and then resolves.  Notices when in the shower.  Some minimal sinus congestion.  No headache.  States blood pressure has been averaging 134-135/76.  No chest pain.  No sob.  Eating.  No nausea or vomiting.  Bowels moving.  Seeing his chiropractor.  States chiropractic treatments helped previously when this occurred.  Seeing vascular.  Last evaluated 05/2018.  Stable.  Recommended f/u ABI in 6 months.  Overall he feels he is doing relatively well.     Past Medical History:  Diagnosis Date  . Basal cell carcinoma (BCC) of back   . Basal cell carcinoma (BCC) of nasal sidewall   . Dizziness   . GERD (gastroesophageal reflux disease)   . Hypercholesterolemia   . Hypertension    hx of HBP readings  . Metastatic squamous cell carcinoma ([place]) 2018   bx cervical lymph node   Past Surgical History:  Procedure Laterality Date  . COLONOSCOPY    . ESOPHAGOGASTRODUODENOSCOPY (EGD) WITH PROPOFOL N/A 05/24/2015   Procedure: ESOPHAGOGASTRODUODENOSCOPY (EGD) WITH PROPOFOL;  Surgeon: Manya Silvas, MD;  Location: [facility] ENDOSCOPY;  Service: Endoscopy;  Laterality: N/A;  . IR IMAGING GUIDED PORT INSERTION  12/09/2016  . SAVORY DILATION N/A 05/24/2015   Procedure: SAVORY DILATION;  Surgeon: Manya Silvas, MD;  Location: [facility] ENDOSCOPY;  Service: Endoscopy;  Laterality: N/A;   Family History  Problem Relation Age of Onset  . Hyperlipidemia Mother   . Heart disease Mother   . Hypertension Mother   . Alzheimer's disease Mother   . Prostate cancer Father   . Stroke Father    Social History   Socioeconomic  History  . Marital status: Married    Spouse name: Not on file  . Number of children: Not on file  . Years of education: Not on file  . Highest education level: Not on file  Occupational History  . Not on file  Social Needs  . Financial resource strain: Not on file  . Food insecurity:    Worry: Not on file    Inability: Not on file  . Transportation needs:    Medical: Not on file    Non-medical: Not on file  Tobacco Use  . Smoking status: Former Smoker    Last attempt to quit: 11/11/1996    Years since quitting: 21.9  . Smokeless tobacco: Never Used  Substance and Sexual Activity  . Alcohol use: No    Alcohol/week: 0.0 standard drinks    Comment: none for 6 months  . Drug use: No  . Sexual activity: Yes  Lifestyle  . Physical activity:    Days per week: Not on file    Minutes per session: Not on file  . Stress: Not on file  Relationships  . Social connections:    Talks on phone: Not on file    Gets together: Not on file    Attends religious service: Not on file    Active member of club or organization: Not on file    Attends meetings  of clubs or organizations: Not on file    Relationship status: Not on file  Other Topics Concern  . Not on file  Social History Narrative  . Not on file    Outpatient Encounter Medications as of 09/21/2018  Medication Sig  . ELIQUIS 5 MG TABS tablet Take 1 tablet (5 mg total) by mouth 2 (two) times daily.  Marland Kitchen lisinopril (PRINIVIL,ZESTRIL) 20 MG tablet TAKE 1 TABLET BY MOUTH EVERY DAY  . metoprolol tartrate (LOPRESSOR) 25 MG tablet TAKE 1/2 TABLET (12.5MG ) BY MOUTH TWICE A DAY  . Multiple Vitamin (MULTIVITAMIN) capsule Take by mouth.  Marland Kitchen omeprazole (PRILOSEC) 20 MG capsule TAKE 1 CAPSULE (20 MG TOTAL) BY MOUTH 2 (TWO) TIMES DAILY.  . pravastatin (PRAVACHOL) 10 MG tablet Take 1 tablet (10 mg total) by mouth daily.  . [DISCONTINUED] clopidogrel (PLAVIX) 75 MG tablet TAKE 1 TABLET (75 MG TOTAL) BY MOUTH ONCE DAILY.   No  facility-administered encounter medications on file as of 09/21/2018.     Review of Systems  Constitutional: Negative for appetite change and unexpected weight change.  HENT: Positive for congestion. Negative for sore throat.   Respiratory: Negative for cough, chest tightness and shortness of breath.   Cardiovascular: Negative for chest pain, palpitations and leg swelling.  Gastrointestinal: Negative for abdominal pain, diarrhea, nausea and vomiting.  Genitourinary: Negative for difficulty urinating and dysuria.  Musculoskeletal: Negative for joint swelling and myalgias.  Skin: Negative for color change and rash.  Neurological: Positive for dizziness. Negative for headaches.  Psychiatric/Behavioral: Negative for agitation and dysphoric mood.       Objective:    Physical Exam Constitutional:      General: He is not in acute distress.    Appearance: Normal appearance. He is well-developed.  HENT:     Nose: Nose normal. No congestion.     Mouth/Throat:     Pharynx: No oropharyngeal exudate or posterior oropharyngeal erythema.  Neck:     Musculoskeletal: Neck supple. No muscular tenderness.  Cardiovascular:     Rate and Rhythm: Normal rate and regular rhythm.  Pulmonary:     Effort: Pulmonary effort is normal. No respiratory distress.     Breath sounds: Normal breath sounds.  Abdominal:     General: Bowel sounds are normal.     Palpations: Abdomen is soft.     Tenderness: There is no abdominal tenderness.  Musculoskeletal:        General: No swelling or tenderness.  Skin:    Findings: No erythema or rash.  Neurological:     Mental Status: He is alert.     Comments: No reproducible symptoms/dizziness on exam.   Psychiatric:        Mood and Affect: Mood normal.        Behavior: Behavior normal.     BP 138/82 (BP Location: Left Arm, Patient Position: Sitting, Cuff Size: Normal)   Pulse 80   Temp 98 F (36.7 C) (Oral)   Resp 16   Wt 159 lb (72.1 kg)   SpO2 97%   BMI  24.90 kg/m  Wt Readings from Last 3 Encounters:  09/21/18 159 lb (72.1 kg)  05/26/18 156 lb 4 oz (70.9 kg)  02/21/18 154 lb 6.9 oz (70.1 kg)     Lab Results  Component Value Date   WBC 6.1 06/02/2018   HGB 13.1 06/02/2018   HCT 39.9 06/02/2018   PLT 356.0 06/02/2018   GLUCOSE 106 (H) 06/02/2018   CHOL 301 (H) 06/02/2018  TRIG 190.0 (H) 06/02/2018   HDL 38.40 (L) 06/02/2018   LDLDIRECT 140.0 02/08/2018   LDLCALC 225 (H) 06/02/2018   ALT 38 06/02/2018   AST 29 06/02/2018   NA 135 06/02/2018   K 4.9 06/02/2018   CL 99 06/02/2018   CREATININE 1.26 06/02/2018   BUN 22 06/02/2018   CO2 27 06/02/2018   TSH 1.160 02/21/2018   PSA 1.07 11/03/2017   INR 1.1 (H) 06/02/2018    Nm Pet Image Restag (ps) Skull Base To Thigh  Result Date: 05/18/2017 CLINICAL DATA:  Initial treatment strategy for head neck carcinoma. oropharyngeal carcinoma Post radiotherapy. EXAM: NUCLEAR MEDICINE PET SKULL BASE TO THIGH TECHNIQUE: 12.8 mCi F-18 FDG was injected intravenously. Full-ring PET imaging was performed from the skull base to thigh after the radiotracer. CT data was obtained and used for attenuation correction and anatomic localization. FASTING BLOOD GLUCOSE:  Value: 94 mg/dl COMPARISON:  PET-CT 12/07/2016 FINDINGS: NECK Decreased metabolic activity hypopharynx. Mild symmetric activity remains SUV max equal 3.5 decreased from the 5.1. Large hypermetabolic RIGHT level 2 lymph node has decreased in size and metabolic activity. Node measures 8 mm decreased in size from 29 mm and now has background metabolic activity decreased from SUV max equals 7.8. No new hypermetabolic lymph nodes in the neck. CHEST Port in the LEFT chest wall. No hypermetabolic mediastinal or hilar nodes. No suspicious pulmonary nodules on the CT scan. ABDOMEN/PELVIS No abnormal hypermetabolic activity within the liver, pancreas, adrenal glands, or spleen. No hypermetabolic lymph nodes in the abdomen or pelvis. Atherosclerotic  calcification of the aorta.  Prostate normal SKELETON No focal hypermetabolic activity to suggest skeletal metastasis. IMPRESSION: 1. Interval reduction of metabolic activity in the hypopharynx to near background level. 2. Resolution of metabolic activity in large RIGHT level 2 necrotic lymph node. 3. No evidence of active metastatic adenopathy in the neck. 4. No distant metastatic disease . Electronically Signed   By: Suzy Bouchard M.D.   On: 05/18/2017 09:04       Assessment & Plan:   Problem List Items Addressed This Visit    Aortic occlusion ([place])    S/p stent placement.  Followed by vascular surgery.        Daytime somnolence    CPAP.       Dizziness    Previously had extensive w/up.  Saw neurology.  Felt to be some underlying neck issues.  Has had recurrence.  Seeing chiropractor.  Feels is helping.  Declines any further w/up at this time.  Follow.        Essential hypertension, benign    Blood pressure as outlined.  Overall stable.  Continue current medication regimen.  Follow pressures.  Follow metabolic panel.       Relevant Orders   Basic metabolic panel   GERD (gastroesophageal reflux disease)    Symptoms controlled.  Follow.        History of malignant neoplasm of oropharynx    S/p chemo and XRT.  [place].  Continue f/u with oncology.        Hypercholesterolemia    Low cholesterol diet and exercise.  Follow lipid panel. Consider statin therapy given history.        Relevant Orders   Lipid panel   Hepatic function panel       Einar Pheasant, MD

## 2018-09-22 DIAGNOSIS — M9901 Segmental and somatic dysfunction of cervical region: Secondary | ICD-10-CM | POA: Diagnosis not present

## 2018-09-22 DIAGNOSIS — M9903 Segmental and somatic dysfunction of lumbar region: Secondary | ICD-10-CM | POA: Diagnosis not present

## 2018-09-22 DIAGNOSIS — M5412 Radiculopathy, cervical region: Secondary | ICD-10-CM | POA: Diagnosis not present

## 2018-09-22 DIAGNOSIS — M9905 Segmental and somatic dysfunction of pelvic region: Secondary | ICD-10-CM | POA: Diagnosis not present

## 2018-09-27 DIAGNOSIS — M5412 Radiculopathy, cervical region: Secondary | ICD-10-CM | POA: Diagnosis not present

## 2018-09-27 DIAGNOSIS — M9903 Segmental and somatic dysfunction of lumbar region: Secondary | ICD-10-CM | POA: Diagnosis not present

## 2018-09-27 DIAGNOSIS — M9901 Segmental and somatic dysfunction of cervical region: Secondary | ICD-10-CM | POA: Diagnosis not present

## 2018-09-27 DIAGNOSIS — M9905 Segmental and somatic dysfunction of pelvic region: Secondary | ICD-10-CM | POA: Diagnosis not present

## 2018-09-29 DIAGNOSIS — M5412 Radiculopathy, cervical region: Secondary | ICD-10-CM | POA: Diagnosis not present

## 2018-09-29 DIAGNOSIS — M9905 Segmental and somatic dysfunction of pelvic region: Secondary | ICD-10-CM | POA: Diagnosis not present

## 2018-09-29 DIAGNOSIS — M9903 Segmental and somatic dysfunction of lumbar region: Secondary | ICD-10-CM | POA: Diagnosis not present

## 2018-09-29 DIAGNOSIS — M9901 Segmental and somatic dysfunction of cervical region: Secondary | ICD-10-CM | POA: Diagnosis not present

## 2018-10-02 ENCOUNTER — Encounter: Payer: Self-pay | Admitting: Internal Medicine

## 2018-10-02 NOTE — Assessment & Plan Note (Signed)
Symptoms controlled.  Follow.   

## 2018-10-02 NOTE — Assessment & Plan Note (Signed)
Previously had extensive w/up.  Saw neurology.  Felt to be some underlying neck issues.  Has had recurrence.  Seeing chiropractor.  Feels is helping.  Declines any further w/up at this time.  Follow.

## 2018-10-02 NOTE — Assessment & Plan Note (Signed)
CPAP.  

## 2018-10-02 NOTE — Assessment & Plan Note (Signed)
S/p stent placement.  Followed by vascular surgery.  

## 2018-10-02 NOTE — Assessment & Plan Note (Signed)
Blood pressure as outlined.  Overall stable.  Continue current medication regimen.  Follow pressures.  Follow metabolic panel.

## 2018-10-02 NOTE — Assessment & Plan Note (Signed)
S/p chemo and XRT.  [place].  Continue f/u with oncology.

## 2018-10-02 NOTE — Assessment & Plan Note (Signed)
Low cholesterol diet and exercise.  Follow lipid panel. Consider statin therapy given history.

## 2018-10-04 DIAGNOSIS — M9903 Segmental and somatic dysfunction of lumbar region: Secondary | ICD-10-CM | POA: Diagnosis not present

## 2018-10-04 DIAGNOSIS — M5412 Radiculopathy, cervical region: Secondary | ICD-10-CM | POA: Diagnosis not present

## 2018-10-04 DIAGNOSIS — M9905 Segmental and somatic dysfunction of pelvic region: Secondary | ICD-10-CM | POA: Diagnosis not present

## 2018-10-04 DIAGNOSIS — M9901 Segmental and somatic dysfunction of cervical region: Secondary | ICD-10-CM | POA: Diagnosis not present

## 2018-10-06 ENCOUNTER — Other Ambulatory Visit (INDEPENDENT_AMBULATORY_CARE_PROVIDER_SITE_OTHER): Payer: BLUE CROSS/BLUE SHIELD

## 2018-10-06 DIAGNOSIS — I10 Essential (primary) hypertension: Secondary | ICD-10-CM | POA: Diagnosis not present

## 2018-10-06 DIAGNOSIS — E7800 Pure hypercholesterolemia, unspecified: Secondary | ICD-10-CM

## 2018-10-06 LAB — LIPID PANEL
CHOLESTEROL: 264 mg/dL — AB (ref 0–200)
HDL: 36.2 mg/dL — AB (ref >39.00)
LDL Cholesterol: 190 mg/dL — ABNORMAL HIGH (ref 0–99)
NONHDL: 227.54
Total CHOL/HDL Ratio: 7
Triglycerides: 189 mg/dL — ABNORMAL HIGH (ref 0.0–149.0)
VLDL: 37.8 mg/dL (ref 0.0–40.0)

## 2018-10-06 LAB — HEPATIC FUNCTION PANEL
ALBUMIN: 4.1 g/dL (ref 3.5–5.2)
ALT: 28 U/L (ref 0–53)
AST: 20 U/L (ref 0–37)
Alkaline Phosphatase: 64 U/L (ref 39–117)
BILIRUBIN TOTAL: 0.4 mg/dL (ref 0.2–1.2)
Bilirubin, Direct: 0.1 mg/dL (ref 0.0–0.3)
TOTAL PROTEIN: 6.9 g/dL (ref 6.0–8.3)

## 2018-10-06 LAB — BASIC METABOLIC PANEL
BUN: 19 mg/dL (ref 6–23)
CALCIUM: 9.5 mg/dL (ref 8.4–10.5)
CO2: 28 mEq/L (ref 19–32)
Chloride: 102 mEq/L (ref 96–112)
Creatinine, Ser: 1.3 mg/dL (ref 0.40–1.50)
GFR: 56.65 mL/min — AB (ref >60.00)
GLUCOSE: 92 mg/dL (ref 70–99)
POTASSIUM: 5.1 meq/L (ref 3.5–5.1)
SODIUM: 137 meq/L (ref 135–145)

## 2018-10-07 ENCOUNTER — Other Ambulatory Visit: Payer: Self-pay | Admitting: Internal Medicine

## 2018-10-07 DIAGNOSIS — E875 Hyperkalemia: Secondary | ICD-10-CM

## 2018-10-07 DIAGNOSIS — R944 Abnormal results of kidney function studies: Secondary | ICD-10-CM

## 2018-10-07 NOTE — Progress Notes (Signed)
Order placed for f/u met b to be run stat.

## 2018-10-10 ENCOUNTER — Other Ambulatory Visit: Payer: Self-pay | Admitting: Internal Medicine

## 2018-10-11 ENCOUNTER — Other Ambulatory Visit (INDEPENDENT_AMBULATORY_CARE_PROVIDER_SITE_OTHER): Payer: BLUE CROSS/BLUE SHIELD

## 2018-10-11 DIAGNOSIS — E875 Hyperkalemia: Secondary | ICD-10-CM | POA: Diagnosis not present

## 2018-10-11 DIAGNOSIS — M5412 Radiculopathy, cervical region: Secondary | ICD-10-CM | POA: Diagnosis not present

## 2018-10-11 DIAGNOSIS — M9903 Segmental and somatic dysfunction of lumbar region: Secondary | ICD-10-CM | POA: Diagnosis not present

## 2018-10-11 DIAGNOSIS — M9905 Segmental and somatic dysfunction of pelvic region: Secondary | ICD-10-CM | POA: Diagnosis not present

## 2018-10-11 DIAGNOSIS — R944 Abnormal results of kidney function studies: Secondary | ICD-10-CM

## 2018-10-11 DIAGNOSIS — M9901 Segmental and somatic dysfunction of cervical region: Secondary | ICD-10-CM | POA: Diagnosis not present

## 2018-10-11 LAB — BASIC METABOLIC PANEL
BUN: 20 mg/dL (ref 6–23)
CHLORIDE: 99 meq/L (ref 96–112)
CO2: 29 meq/L (ref 19–32)
Calcium: 9.9 mg/dL (ref 8.4–10.5)
Creatinine, Ser: 1.25 mg/dL (ref 0.40–1.50)
GFR: 59.27 mL/min — ABNORMAL LOW (ref >60.00)
Glucose, Bld: 91 mg/dL (ref 70–99)
Potassium: 4.8 mEq/L (ref 3.5–5.1)
Sodium: 135 mEq/L (ref 135–145)

## 2018-10-12 ENCOUNTER — Encounter: Payer: Self-pay | Admitting: Internal Medicine

## 2018-10-17 DIAGNOSIS — M5412 Radiculopathy, cervical region: Secondary | ICD-10-CM | POA: Diagnosis not present

## 2018-10-17 DIAGNOSIS — M9901 Segmental and somatic dysfunction of cervical region: Secondary | ICD-10-CM | POA: Diagnosis not present

## 2018-10-17 DIAGNOSIS — M9903 Segmental and somatic dysfunction of lumbar region: Secondary | ICD-10-CM | POA: Diagnosis not present

## 2018-10-17 DIAGNOSIS — M9905 Segmental and somatic dysfunction of pelvic region: Secondary | ICD-10-CM | POA: Diagnosis not present

## 2018-10-20 DIAGNOSIS — M9905 Segmental and somatic dysfunction of pelvic region: Secondary | ICD-10-CM | POA: Diagnosis not present

## 2018-10-20 DIAGNOSIS — M9903 Segmental and somatic dysfunction of lumbar region: Secondary | ICD-10-CM | POA: Diagnosis not present

## 2018-10-20 DIAGNOSIS — M5412 Radiculopathy, cervical region: Secondary | ICD-10-CM | POA: Diagnosis not present

## 2018-10-20 DIAGNOSIS — M9901 Segmental and somatic dysfunction of cervical region: Secondary | ICD-10-CM | POA: Diagnosis not present

## 2018-10-24 DIAGNOSIS — M9901 Segmental and somatic dysfunction of cervical region: Secondary | ICD-10-CM | POA: Diagnosis not present

## 2018-10-24 DIAGNOSIS — M9905 Segmental and somatic dysfunction of pelvic region: Secondary | ICD-10-CM | POA: Diagnosis not present

## 2018-10-24 DIAGNOSIS — M5412 Radiculopathy, cervical region: Secondary | ICD-10-CM | POA: Diagnosis not present

## 2018-10-24 DIAGNOSIS — M9903 Segmental and somatic dysfunction of lumbar region: Secondary | ICD-10-CM | POA: Diagnosis not present

## 2018-10-27 DIAGNOSIS — M9903 Segmental and somatic dysfunction of lumbar region: Secondary | ICD-10-CM | POA: Diagnosis not present

## 2018-10-27 DIAGNOSIS — M9901 Segmental and somatic dysfunction of cervical region: Secondary | ICD-10-CM | POA: Diagnosis not present

## 2018-10-27 DIAGNOSIS — M9905 Segmental and somatic dysfunction of pelvic region: Secondary | ICD-10-CM | POA: Diagnosis not present

## 2018-10-27 DIAGNOSIS — M5412 Radiculopathy, cervical region: Secondary | ICD-10-CM | POA: Diagnosis not present

## 2018-10-31 DIAGNOSIS — M9901 Segmental and somatic dysfunction of cervical region: Secondary | ICD-10-CM | POA: Diagnosis not present

## 2018-10-31 DIAGNOSIS — M9903 Segmental and somatic dysfunction of lumbar region: Secondary | ICD-10-CM | POA: Diagnosis not present

## 2018-10-31 DIAGNOSIS — M5412 Radiculopathy, cervical region: Secondary | ICD-10-CM | POA: Diagnosis not present

## 2018-10-31 DIAGNOSIS — M9905 Segmental and somatic dysfunction of pelvic region: Secondary | ICD-10-CM | POA: Diagnosis not present

## 2018-11-03 DIAGNOSIS — M9903 Segmental and somatic dysfunction of lumbar region: Secondary | ICD-10-CM | POA: Diagnosis not present

## 2018-11-03 DIAGNOSIS — M5412 Radiculopathy, cervical region: Secondary | ICD-10-CM | POA: Diagnosis not present

## 2018-11-03 DIAGNOSIS — M9901 Segmental and somatic dysfunction of cervical region: Secondary | ICD-10-CM | POA: Diagnosis not present

## 2018-11-03 DIAGNOSIS — M9905 Segmental and somatic dysfunction of pelvic region: Secondary | ICD-10-CM | POA: Diagnosis not present

## 2018-11-07 DIAGNOSIS — M5412 Radiculopathy, cervical region: Secondary | ICD-10-CM | POA: Diagnosis not present

## 2018-11-07 DIAGNOSIS — M9903 Segmental and somatic dysfunction of lumbar region: Secondary | ICD-10-CM | POA: Diagnosis not present

## 2018-11-07 DIAGNOSIS — M9905 Segmental and somatic dysfunction of pelvic region: Secondary | ICD-10-CM | POA: Diagnosis not present

## 2018-11-07 DIAGNOSIS — M9901 Segmental and somatic dysfunction of cervical region: Secondary | ICD-10-CM | POA: Diagnosis not present

## 2018-11-10 DIAGNOSIS — M5412 Radiculopathy, cervical region: Secondary | ICD-10-CM | POA: Diagnosis not present

## 2018-11-10 DIAGNOSIS — M9905 Segmental and somatic dysfunction of pelvic region: Secondary | ICD-10-CM | POA: Diagnosis not present

## 2018-11-10 DIAGNOSIS — M9903 Segmental and somatic dysfunction of lumbar region: Secondary | ICD-10-CM | POA: Diagnosis not present

## 2018-11-10 DIAGNOSIS — M9901 Segmental and somatic dysfunction of cervical region: Secondary | ICD-10-CM | POA: Diagnosis not present

## 2018-11-14 DIAGNOSIS — M5412 Radiculopathy, cervical region: Secondary | ICD-10-CM | POA: Diagnosis not present

## 2018-11-14 DIAGNOSIS — M9903 Segmental and somatic dysfunction of lumbar region: Secondary | ICD-10-CM | POA: Diagnosis not present

## 2018-11-14 DIAGNOSIS — M9905 Segmental and somatic dysfunction of pelvic region: Secondary | ICD-10-CM | POA: Diagnosis not present

## 2018-11-14 DIAGNOSIS — M9901 Segmental and somatic dysfunction of cervical region: Secondary | ICD-10-CM | POA: Diagnosis not present

## 2018-11-17 DIAGNOSIS — M5412 Radiculopathy, cervical region: Secondary | ICD-10-CM | POA: Diagnosis not present

## 2018-11-17 DIAGNOSIS — M9903 Segmental and somatic dysfunction of lumbar region: Secondary | ICD-10-CM | POA: Diagnosis not present

## 2018-11-17 DIAGNOSIS — M9901 Segmental and somatic dysfunction of cervical region: Secondary | ICD-10-CM | POA: Diagnosis not present

## 2018-11-17 DIAGNOSIS — M9905 Segmental and somatic dysfunction of pelvic region: Secondary | ICD-10-CM | POA: Diagnosis not present

## 2018-11-21 DIAGNOSIS — M5412 Radiculopathy, cervical region: Secondary | ICD-10-CM | POA: Diagnosis not present

## 2018-11-21 DIAGNOSIS — M9901 Segmental and somatic dysfunction of cervical region: Secondary | ICD-10-CM | POA: Diagnosis not present

## 2018-11-21 DIAGNOSIS — M9903 Segmental and somatic dysfunction of lumbar region: Secondary | ICD-10-CM | POA: Diagnosis not present

## 2018-11-21 DIAGNOSIS — M9905 Segmental and somatic dysfunction of pelvic region: Secondary | ICD-10-CM | POA: Diagnosis not present

## 2018-11-28 DIAGNOSIS — M5412 Radiculopathy, cervical region: Secondary | ICD-10-CM | POA: Diagnosis not present

## 2018-11-28 DIAGNOSIS — M9901 Segmental and somatic dysfunction of cervical region: Secondary | ICD-10-CM | POA: Diagnosis not present

## 2018-11-28 DIAGNOSIS — M9905 Segmental and somatic dysfunction of pelvic region: Secondary | ICD-10-CM | POA: Diagnosis not present

## 2018-11-28 DIAGNOSIS — M9903 Segmental and somatic dysfunction of lumbar region: Secondary | ICD-10-CM | POA: Diagnosis not present

## 2018-12-01 DIAGNOSIS — M5412 Radiculopathy, cervical region: Secondary | ICD-10-CM | POA: Diagnosis not present

## 2018-12-01 DIAGNOSIS — M9905 Segmental and somatic dysfunction of pelvic region: Secondary | ICD-10-CM | POA: Diagnosis not present

## 2018-12-01 DIAGNOSIS — M9901 Segmental and somatic dysfunction of cervical region: Secondary | ICD-10-CM | POA: Diagnosis not present

## 2018-12-01 DIAGNOSIS — M9903 Segmental and somatic dysfunction of lumbar region: Secondary | ICD-10-CM | POA: Diagnosis not present

## 2018-12-05 DIAGNOSIS — M9903 Segmental and somatic dysfunction of lumbar region: Secondary | ICD-10-CM | POA: Diagnosis not present

## 2018-12-05 DIAGNOSIS — M9901 Segmental and somatic dysfunction of cervical region: Secondary | ICD-10-CM | POA: Diagnosis not present

## 2018-12-05 DIAGNOSIS — M9905 Segmental and somatic dysfunction of pelvic region: Secondary | ICD-10-CM | POA: Diagnosis not present

## 2018-12-05 DIAGNOSIS — M5412 Radiculopathy, cervical region: Secondary | ICD-10-CM | POA: Diagnosis not present

## 2018-12-08 DIAGNOSIS — M9901 Segmental and somatic dysfunction of cervical region: Secondary | ICD-10-CM | POA: Diagnosis not present

## 2018-12-08 DIAGNOSIS — M9903 Segmental and somatic dysfunction of lumbar region: Secondary | ICD-10-CM | POA: Diagnosis not present

## 2018-12-08 DIAGNOSIS — M9905 Segmental and somatic dysfunction of pelvic region: Secondary | ICD-10-CM | POA: Diagnosis not present

## 2018-12-08 DIAGNOSIS — M5412 Radiculopathy, cervical region: Secondary | ICD-10-CM | POA: Diagnosis not present

## 2018-12-12 DIAGNOSIS — M9905 Segmental and somatic dysfunction of pelvic region: Secondary | ICD-10-CM | POA: Diagnosis not present

## 2018-12-12 DIAGNOSIS — M5412 Radiculopathy, cervical region: Secondary | ICD-10-CM | POA: Diagnosis not present

## 2018-12-12 DIAGNOSIS — M9903 Segmental and somatic dysfunction of lumbar region: Secondary | ICD-10-CM | POA: Diagnosis not present

## 2018-12-12 DIAGNOSIS — M9901 Segmental and somatic dysfunction of cervical region: Secondary | ICD-10-CM | POA: Diagnosis not present

## 2019-01-10 ENCOUNTER — Other Ambulatory Visit: Payer: Self-pay | Admitting: Internal Medicine

## 2019-01-15 ENCOUNTER — Other Ambulatory Visit: Payer: Self-pay | Admitting: Internal Medicine

## 2019-01-18 ENCOUNTER — Other Ambulatory Visit: Payer: Self-pay

## 2019-01-20 ENCOUNTER — Ambulatory Visit (INDEPENDENT_AMBULATORY_CARE_PROVIDER_SITE_OTHER): Payer: 59 | Admitting: Internal Medicine

## 2019-01-20 ENCOUNTER — Encounter: Payer: Self-pay | Admitting: Internal Medicine

## 2019-01-20 ENCOUNTER — Other Ambulatory Visit: Payer: Self-pay

## 2019-01-20 VITALS — BP 122/78 | HR 89 | Temp 97.9°F | Resp 16 | Ht 67.0 in | Wt 158.8 lb

## 2019-01-20 DIAGNOSIS — Z0000 Encounter for general adult medical examination without abnormal findings: Secondary | ICD-10-CM

## 2019-01-20 DIAGNOSIS — I10 Essential (primary) hypertension: Secondary | ICD-10-CM

## 2019-01-20 DIAGNOSIS — E7800 Pure hypercholesterolemia, unspecified: Secondary | ICD-10-CM

## 2019-01-20 DIAGNOSIS — I700 Atherosclerosis of aorta: Secondary | ICD-10-CM

## 2019-01-20 DIAGNOSIS — R42 Dizziness and giddiness: Secondary | ICD-10-CM

## 2019-01-20 DIAGNOSIS — Z85819 Personal history of malignant neoplasm of unspecified site of lip, oral cavity, and pharynx: Secondary | ICD-10-CM

## 2019-01-20 DIAGNOSIS — Z125 Encounter for screening for malignant neoplasm of prostate: Secondary | ICD-10-CM

## 2019-01-20 DIAGNOSIS — R5383 Other fatigue: Secondary | ICD-10-CM

## 2019-01-20 DIAGNOSIS — K219 Gastro-esophageal reflux disease without esophagitis: Secondary | ICD-10-CM

## 2019-01-20 DIAGNOSIS — I7410 Embolism and thrombosis of unspecified parts of aorta: Secondary | ICD-10-CM

## 2019-01-20 NOTE — Assessment & Plan Note (Signed)
Physical today 01/20/19.  Check psa.  Colonoscopy 06/2013 - internal hemorrhoids.

## 2019-01-20 NOTE — Progress Notes (Signed)
[name], male   DOB: Apr 24, 1961, 58 y.o.   MRN: [id number]   Subjective:    [name], male    DOB: January 30, 1961, 58 y.o.   MRN: [id number]  HPI  Patient here for his scheduled physical.  He reports he is doing relatively well.  Still having some fatigue. Discussed with him today.  Could be multifactorial.  No chest pain.  No sob.  Only on low dose of metoprolol.  Will stop and see if fatigue improves.  Blood pressure doing well.  No chest pain.  No sob.  No acid reflux.  No abdominal pain.  Bowels moving.  Saw his chiropractor for dizziness.  Helped.  No significant dizziness now.  Still will occasionally notice, but overall improved.  Feels he is doing well.  Handling stress.     Past Medical History:  Diagnosis Date   Basal cell carcinoma (BCC) of back    Basal cell carcinoma (BCC) of nasal sidewall    Dizziness    GERD (gastroesophageal reflux disease)    Hypercholesterolemia    Hypertension    hx of HBP readings   Metastatic squamous cell carcinoma ([place]) 2018   bx cervical lymph node   Past Surgical History:  Procedure Laterality Date   COLONOSCOPY     ESOPHAGOGASTRODUODENOSCOPY (EGD) WITH PROPOFOL N/A 05/24/2015   Procedure: ESOPHAGOGASTRODUODENOSCOPY (EGD) WITH PROPOFOL;  Surgeon: Manya Silvas, MD;  Location: [place];  Service: Endoscopy;  Laterality: N/A;   IR IMAGING GUIDED PORT INSERTION  12/09/2016   SAVORY DILATION N/A 05/24/2015   Procedure: SAVORY DILATION;  Surgeon: Manya Silvas, MD;  Location: [facility] ENDOSCOPY;  Service: Endoscopy;  Laterality: N/A;   Family History  Problem Relation Age of Onset   Hyperlipidemia Mother    Heart disease Mother    Hypertension Mother    Alzheimer's disease Mother    Prostate cancer Father    Stroke Father    Social History   Socioeconomic History   Marital status: Married    Spouse name: Not on file   Number of children: Not on file   Years of education: Not  on file   Highest education level: Not on file  Occupational History   Not on file  Social Needs   Financial resource strain: Not on file   Food insecurity:    Worry: Not on file    Inability: Not on file   Transportation needs:    Medical: Not on file    Non-medical: Not on file  Tobacco Use   Smoking status: Former Smoker    Last attempt to quit: 11/11/1996    Years since quitting: 22.2   Smokeless tobacco: Never Used  Substance and Sexual Activity   Alcohol use: No    Alcohol/week: 0.0 standard drinks    Comment: none for 6 months   Drug use: No   Sexual activity: Yes  Lifestyle   Physical activity:    Days per week: Not on file    Minutes per session: Not on file   Stress: Not on file  Relationships   Social connections:    Talks on phone: Not on file    Gets together: Not on file    Attends religious service: Not on file    Active member of club or organization: Not on file    Attends meetings of clubs or organizations: Not on file    Relationship status: Not on file  Other Topics Concern  Not on file  Social History Narrative   Not on file    Outpatient Encounter Medications as of 01/20/2019  Medication Sig   ELIQUIS 5 MG TABS tablet Take 1 tablet (5 mg total) by mouth 2 (two) times daily.   lisinopril (ZESTRIL) 20 MG tablet TAKE 1 TABLET BY MOUTH EVERY DAY   Multiple Vitamin (MULTIVITAMIN) capsule Take by mouth.   omeprazole (PRILOSEC) 20 MG capsule TAKE 1 CAPSULE (20 MG TOTAL) BY MOUTH 2 (TWO) TIMES DAILY.   pravastatin (PRAVACHOL) 10 MG tablet TAKE 1 TABLET BY MOUTH EVERY DAY   [DISCONTINUED] metoprolol tartrate (LOPRESSOR) 25 MG tablet TAKE 1/2 TABLET (12.5MG ) BY MOUTH TWICE A DAY   No facility-administered encounter medications on file as of 01/20/2019.     Review of Systems  Constitutional: Negative for appetite change and unexpected weight change.  HENT: Negative for congestion and sinus pressure.   Eyes: Negative for pain and  visual disturbance.  Respiratory: Negative for cough, chest tightness and shortness of breath.   Cardiovascular: Negative for chest pain, palpitations and leg swelling.  Gastrointestinal: Negative for abdominal pain, diarrhea, nausea and vomiting.  Genitourinary: Negative for difficulty urinating and dysuria.  Musculoskeletal: Negative for joint swelling and myalgias.  Skin: Negative for color change and rash.  Neurological: Negative for dizziness, light-headedness and headaches.  Hematological: Negative for adenopathy. Does not bruise/bleed easily.  Psychiatric/Behavioral: Negative for agitation and dysphoric mood.       Objective:    Physical Exam Constitutional:      General: He is not in acute distress.    Appearance: Normal appearance. He is well-developed.  HENT:     Head: Normocephalic and atraumatic.  Eyes:     General:        Right eye: No discharge.        Left eye: No discharge.     Conjunctiva/sclera: Conjunctivae normal.  Neck:     Musculoskeletal: Neck supple. No muscular tenderness.     Thyroid: No thyromegaly.  Cardiovascular:     Rate and Rhythm: Normal rate and regular rhythm.  Pulmonary:     Effort: No respiratory distress.     Breath sounds: Normal breath sounds. No wheezing.  Abdominal:     General: Bowel sounds are normal.     Palpations: Abdomen is soft.     Tenderness: There is no abdominal tenderness.  Genitourinary:    Comments: Not performed.   Musculoskeletal:        General: No tenderness.  Lymphadenopathy:     Cervical: No cervical adenopathy.  Skin:    Findings: No erythema or rash.  Neurological:     Mental Status: He is alert and oriented to person, place, and time.  Psychiatric:        Mood and Affect: Mood normal.        Behavior: Behavior normal.     BP 122/78    Pulse 89    Temp 97.9 F (36.6 C) (Oral)    Resp 16    Ht 5\' 7"  (1.702 m)    Wt 158 lb 12.8 oz (72 kg)    SpO2 97%    BMI 24.87 kg/m  Wt Readings from Last 3  Encounters:  01/20/19 158 lb 12.8 oz (72 kg)  09/21/18 159 lb (72.1 kg)  05/26/18 156 lb 4 oz (70.9 kg)     Lab Results  Component Value Date   WBC 6.1 06/02/2018   HGB 13.1 06/02/2018   HCT 39.9 06/02/2018  PLT 356.0 06/02/2018   GLUCOSE 91 10/11/2018   CHOL 264 (H) 10/06/2018   TRIG 189.0 (H) 10/06/2018   HDL 36.20 (L) 10/06/2018   LDLDIRECT 140.0 02/08/2018   LDLCALC 190 (H) 10/06/2018   ALT 28 10/06/2018   AST 20 10/06/2018   NA 135 10/11/2018   K 4.8 10/11/2018   CL 99 10/11/2018   CREATININE 1.25 10/11/2018   BUN 20 10/11/2018   CO2 29 10/11/2018   TSH 1.160 02/21/2018   PSA 1.07 11/03/2017   INR 1.1 (H) 06/02/2018    Nm Pet Image Restag (ps) Skull Base To Thigh  Result Date: 05/18/2017 CLINICAL DATA:  Initial treatment strategy for head neck carcinoma. oropharyngeal carcinoma Post radiotherapy. EXAM: NUCLEAR MEDICINE PET SKULL BASE TO THIGH TECHNIQUE: 12.8 mCi F-18 FDG was injected intravenously. Full-ring PET imaging was performed from the skull base to thigh after the radiotracer. CT data was obtained and used for attenuation correction and anatomic localization. FASTING BLOOD GLUCOSE:  Value: 94 mg/dl COMPARISON:  PET-CT 12/07/2016 FINDINGS: NECK Decreased metabolic activity hypopharynx. Mild symmetric activity remains SUV max equal 3.5 decreased from the 5.1. Large hypermetabolic RIGHT level 2 lymph node has decreased in size and metabolic activity. Node measures 8 mm decreased in size from 29 mm and now has background metabolic activity decreased from SUV max equals 7.8. No new hypermetabolic lymph nodes in the neck. CHEST Port in the LEFT chest wall. No hypermetabolic mediastinal or hilar nodes. No suspicious pulmonary nodules on the CT scan. ABDOMEN/PELVIS No abnormal hypermetabolic activity within the liver, pancreas, adrenal glands, or spleen. No hypermetabolic lymph nodes in the abdomen or pelvis. Atherosclerotic calcification of the aorta.  Prostate normal  SKELETON No focal hypermetabolic activity to suggest skeletal metastasis. IMPRESSION: 1. Interval reduction of metabolic activity in the hypopharynx to near background level. 2. Resolution of metabolic activity in large RIGHT level 2 necrotic lymph node. 3. No evidence of active metastatic adenopathy in the neck. 4. No distant metastatic disease . Electronically Signed   By: Suzy Bouchard M.D.   On: 05/18/2017 09:04       Assessment & Plan:   Problem List Items Addressed This Visit    Aortic occlusion ([place])    S/p stent placement.  Followed by vascular surgery.  States just evaluated and ok.        Dizziness    Improved after seeing chiropractor.  Wants to monitor.  Follow.        Essential hypertension, benign    Blood pressure as outlined.  Will stop toprol.  Monitor pressures.  Follow metabolic panel.       Relevant Orders   Comprehensive metabolic panel   Fatigue    Felt to be multifactorial.  Will stop toprol.  Follow.        Relevant Orders   CBC with Differential/Platelet   TSH   GERD (gastroesophageal reflux disease)    Controlled on current regimen.  Follow.        Health care maintenance    Physical today 01/20/19.  Check psa.  Colonoscopy 06/2013 - internal hemorrhoids.        History of malignant neoplasm of oropharynx    S/p chemo and XRT.  Continue f/u with oncology.      Hypercholesterolemia    Low cholesterol diet and exercise.  Follow lipid panel.        Relevant Orders   Lipid panel    Other Visit Diagnoses    Routine general medical examination  at a health care facility    -  Primary   Prostate cancer screening       Relevant Orders   PSA       Einar Pheasant, MD

## 2019-01-22 ENCOUNTER — Encounter: Payer: Self-pay | Admitting: Internal Medicine

## 2019-01-22 NOTE — Assessment & Plan Note (Signed)
S/p chemo and XRT.  Continue f/u with oncology.

## 2019-01-22 NOTE — Assessment & Plan Note (Signed)
Blood pressure as outlined.  Will stop toprol.  Monitor pressures.  Follow metabolic panel.

## 2019-01-22 NOTE — Assessment & Plan Note (Signed)
S/p stent placement.  Followed by vascular surgery.  States just evaluated and ok.

## 2019-01-22 NOTE — Assessment & Plan Note (Addendum)
Improved after seeing chiropractor.  Wants to monitor.  Follow.

## 2019-01-22 NOTE — Assessment & Plan Note (Signed)
Felt to be multifactorial.  Will stop toprol.  Follow.

## 2019-01-22 NOTE — Assessment & Plan Note (Signed)
Low cholesterol diet and exercise.  Follow lipid panel.   

## 2019-01-22 NOTE — Assessment & Plan Note (Signed)
Controlled on current regimen.  Follow.  

## 2019-01-24 ENCOUNTER — Ambulatory Visit: Payer: 59

## 2019-02-03 IMAGING — CT NM PET TUM IMG INITIAL (PI) SKULL BASE T - THIGH
9 series · 21 of 25 positions shown · non-contrast
Comparison: Neck CT 10/30/2016.

CLINICAL DATA: Initial treatment strategy for squamous cell
carcinoma of the oropharynx. Right-sided neck biopsy 2 weeks ago..

EXAM:
NUCLEAR MEDICINE PET SKULL BASE TO THIGH
TECHNIQUE: 13.4 mCi F-18 FDG was injected intravenously. Full-ring PET imaging
was performed from the skull base to thigh after the radiotracer. CT
data was obtained and used for attenuation correction and anatomic
localization.
FASTING BLOOD GLUCOSE:  Value: 105 mg/dl

[Series 3: ct wb 5.0 b30f · axial · 5.0mm · 0.98mm/px · z∈[-1458,-882]mm · 3 of 290 slices shown]
[im 1/290]
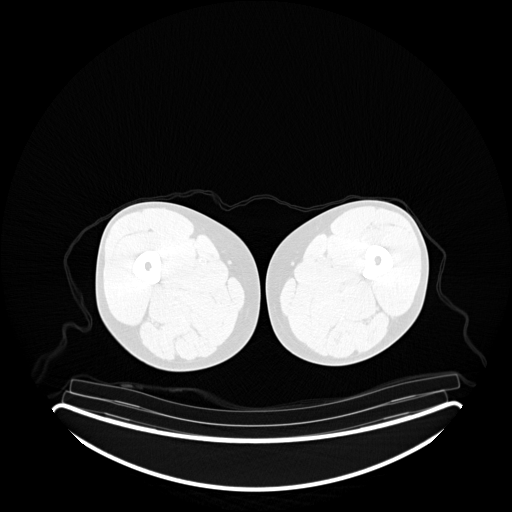
[im 97/290]
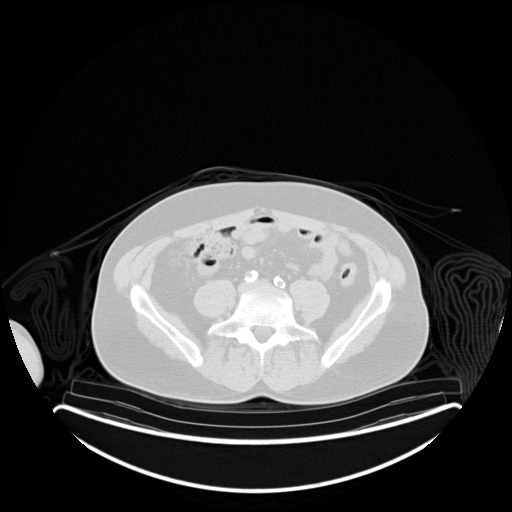
[im 193/290]
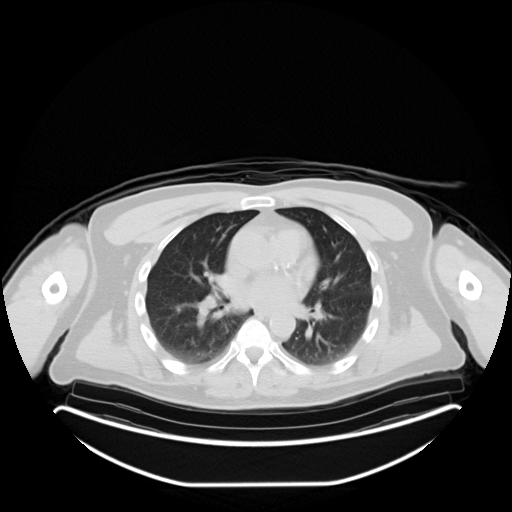

[Series 5: pet wb uncorrected (nac) · axial · 5.0mm · 4.07mm/px · z∈[-1458,-591]mm · 4 of 290 slices shown]
[im 1/290]
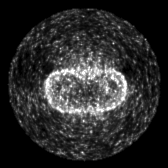
[im 97/290]
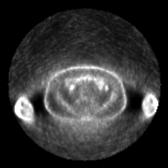
[im 193/290]
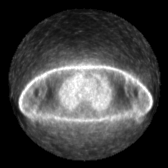
[im 290/290]
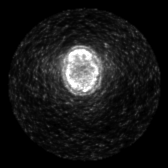

[Series 603: pet_ct axial fused · 3 of 290 slices shown]
[im 1/290]
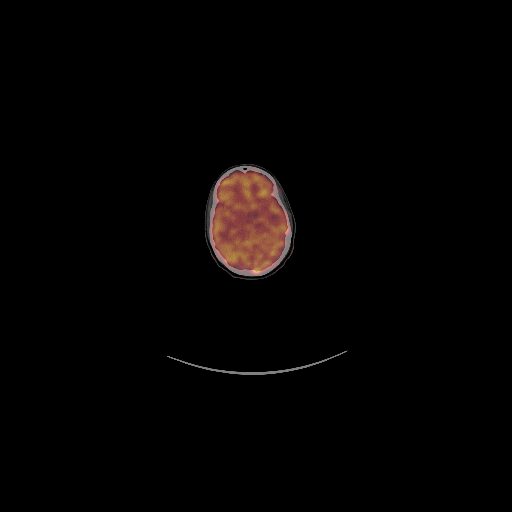
[im 193/290]
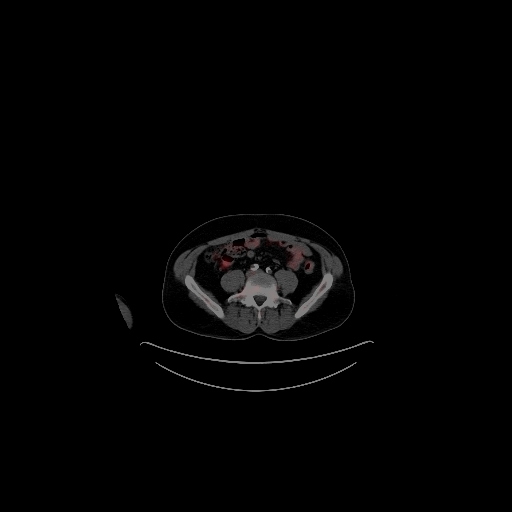
[im 290/290]
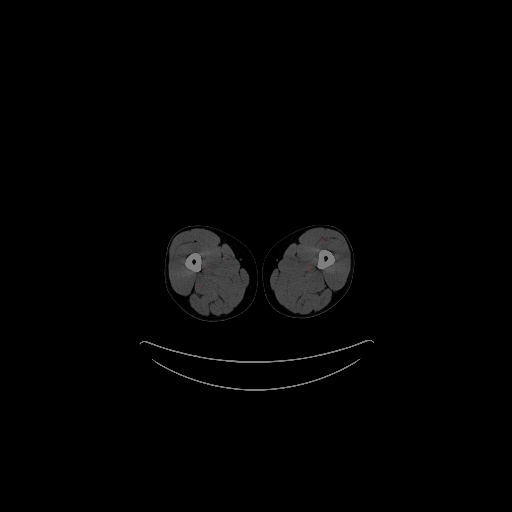

[Series 604: pet_ct coronal fused · 2 of 103 slices shown]
[im 1/103]
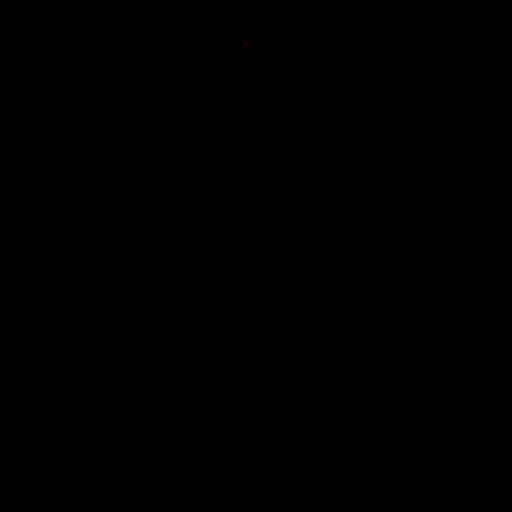
[im 103/103]
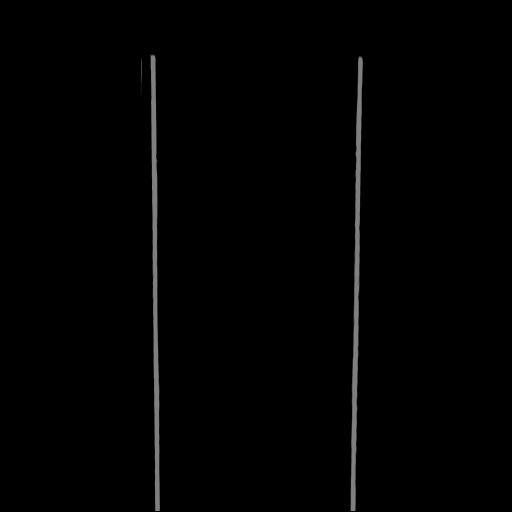

[Series 605: pet_ct sagittal fused · 1 of 153 slices shown]
[im 1/153]
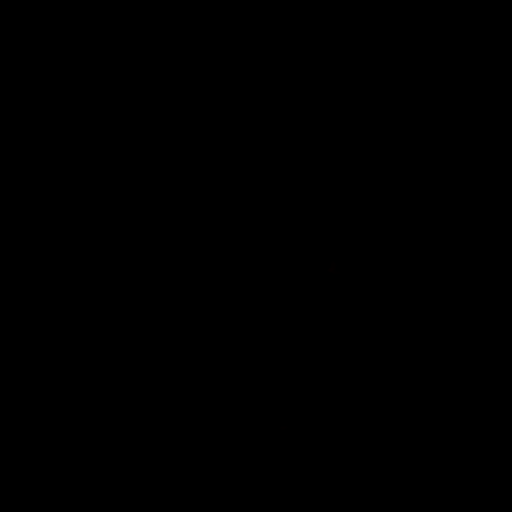

[Series 606: pet axial · 4 of 288 slices shown]
[im 1/288]
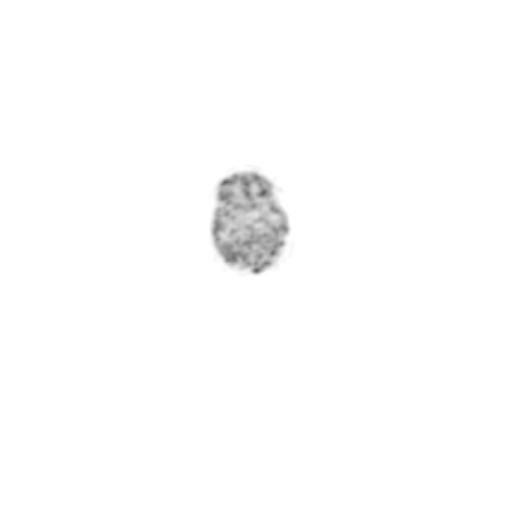
[im 96/288]
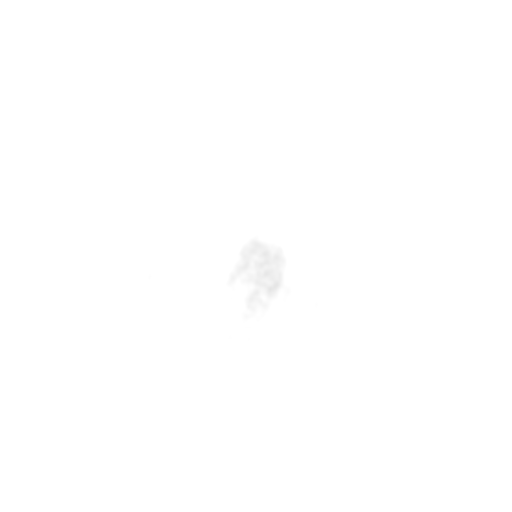
[im 192/288]
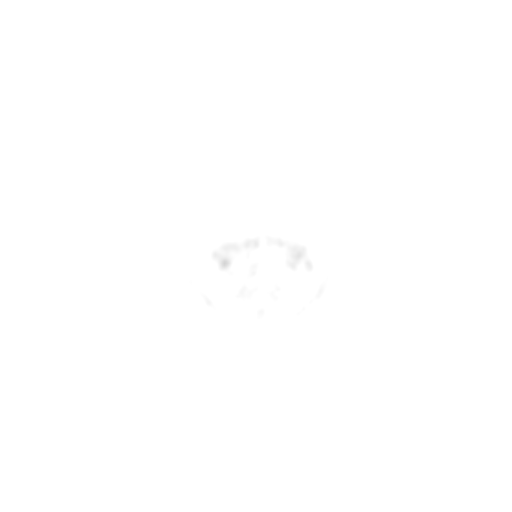
[im 288/288]
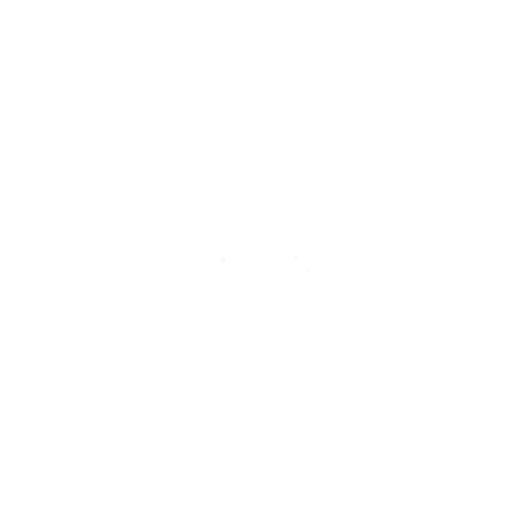

[Series 607: pet coronal · 1 of 103 slices shown]
[im 1/103]
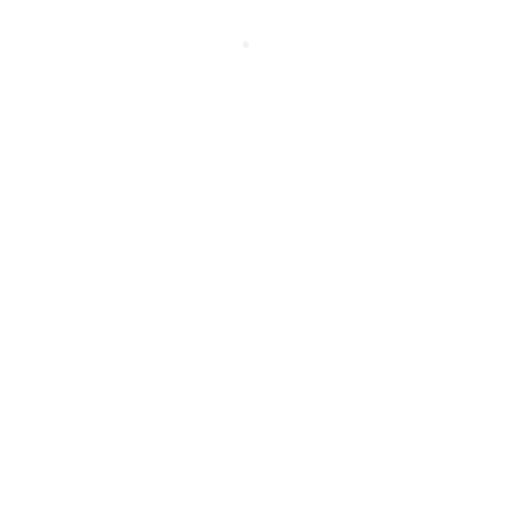

[Series 608: pet sagittal · 2 of 135 slices shown]
[im 1/135]
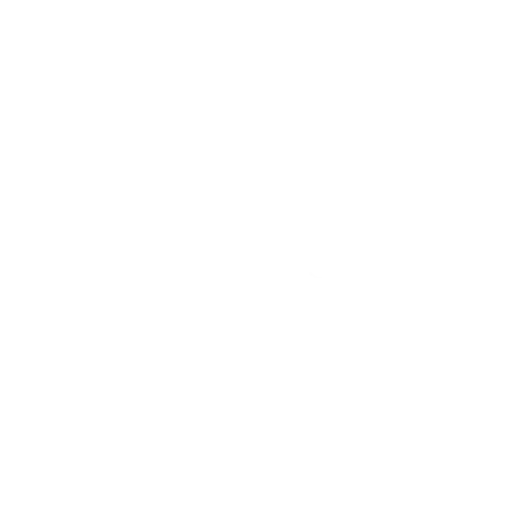
[im 135/135]
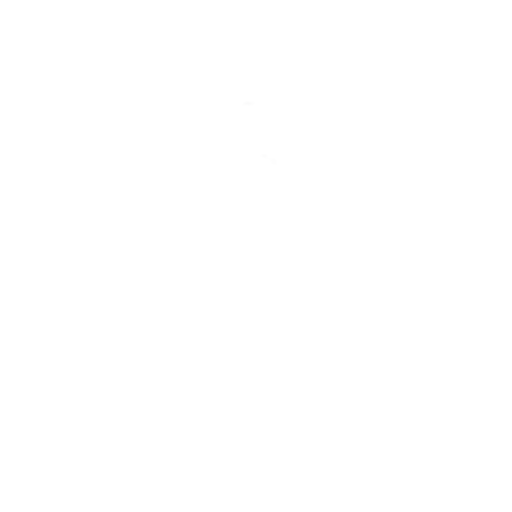

[Series 1031: results mm oncology reading · 3.0mm · 0.96mm/px · 1 of 3 slices shown]
[im 1/3]
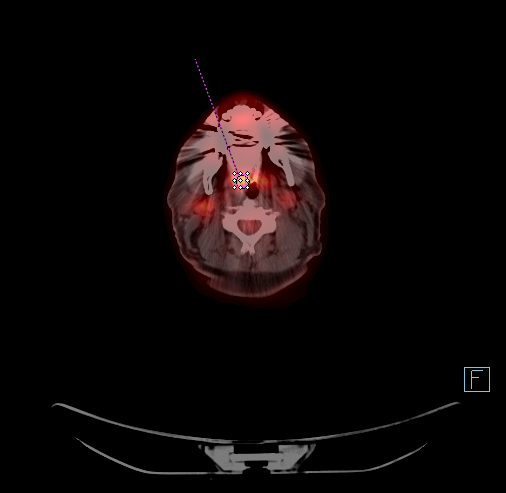

[21 of 25 positions shown; findings below may reference images not displayed]

FINDINGS: NECK

Hypermetabolism corresponding to the previously described
right-sided level 2 node. This measures 2.5 cm and a S.U.V. max of
7.8 on image 39/series 3. Areas of photopenia within are likely
related to necrosis.

Minimal asymmetry involving the palatine tonsils as detailed on
prior CT. The right palatine tonsil measures a S.U.V. max of 5.1 and
the left palatine tonsil measures a S.U.V. max of 4.6.

No other hypermetabolic cervical nodes. Bilateral carotid
atherosclerosis.

CHEST

No pulmonary parenchymal or thoracic nodal hypermetabolism. Aortic
and branch vessel atherosclerosis. Mild cardiomegaly. Multivessel
coronary artery atherosclerosis.

ABDOMEN/PELVIS

No abdominopelvic nodal or parenchymal hypermetabolism. Abdominal
aortic and branch vessel atherosclerosis. Normal adrenal glands.

SKELETON

No abnormal marrow activity. No focal osseous lesion.
IMPRESSION: 1. Isolated nodal metastasis in the right level 2 station.
2. Subtle asymmetric enlargement and hypermetabolism of the right
palatine tonsil . This likely corresponds to the primary lesion
detailed on clinic notes.
3. No extracervical disease identified.
4. Age advanced coronary artery atherosclerosis. Recommend
assessment of coronary risk factors and consideration of medical
therapy.
5.  Aortic atherosclerosis.

## 2019-02-16 ENCOUNTER — Other Ambulatory Visit: Payer: Self-pay

## 2019-02-16 ENCOUNTER — Other Ambulatory Visit (INDEPENDENT_AMBULATORY_CARE_PROVIDER_SITE_OTHER): Payer: 59

## 2019-02-16 DIAGNOSIS — E7800 Pure hypercholesterolemia, unspecified: Secondary | ICD-10-CM

## 2019-02-16 DIAGNOSIS — I10 Essential (primary) hypertension: Secondary | ICD-10-CM | POA: Diagnosis not present

## 2019-02-16 DIAGNOSIS — R5383 Other fatigue: Secondary | ICD-10-CM

## 2019-02-16 DIAGNOSIS — Z125 Encounter for screening for malignant neoplasm of prostate: Secondary | ICD-10-CM

## 2019-02-16 LAB — CBC WITH DIFFERENTIAL/PLATELET
Basophils Absolute: 0 10*3/uL (ref 0.0–0.1)
Basophils Relative: 0.6 % (ref 0.0–3.0)
Eosinophils Absolute: 0.3 10*3/uL (ref 0.0–0.7)
Eosinophils Relative: 4.4 % (ref 0.0–5.0)
HCT: 39.3 % (ref 39.0–52.0)
Hemoglobin: 12.9 g/dL — ABNORMAL LOW (ref 13.0–17.0)
Lymphocytes Relative: 24.9 % (ref 12.0–46.0)
Lymphs Abs: 1.9 10*3/uL (ref 0.7–4.0)
MCHC: 32.7 g/dL (ref 30.0–36.0)
MCV: 84.3 fl (ref 78.0–100.0)
Monocytes Absolute: 0.8 10*3/uL (ref 0.1–1.0)
Monocytes Relative: 9.7 % (ref 3.0–12.0)
Neutro Abs: 4.7 10*3/uL (ref 1.4–7.7)
Neutrophils Relative %: 60.4 % (ref 43.0–77.0)
Platelets: 288 10*3/uL (ref 150.0–400.0)
RBC: 4.66 Mil/uL (ref 4.22–5.81)
RDW: 14.9 % (ref 11.5–15.5)
WBC: 7.8 10*3/uL (ref 4.0–10.5)

## 2019-02-16 LAB — COMPREHENSIVE METABOLIC PANEL
ALT: 29 U/L (ref 0–53)
AST: 23 U/L (ref 0–37)
Albumin: 4.3 g/dL (ref 3.5–5.2)
Alkaline Phosphatase: 50 U/L (ref 39–117)
BUN: 19 mg/dL (ref 6–23)
CO2: 27 mEq/L (ref 19–32)
Calcium: 9.6 mg/dL (ref 8.4–10.5)
Chloride: 101 mEq/L (ref 96–112)
Creatinine, Ser: 1.23 mg/dL (ref 0.40–1.50)
GFR: 60.31 mL/min (ref >60.00)
Glucose, Bld: 92 mg/dL (ref 70–99)
Potassium: 5.1 mEq/L (ref 3.5–5.1)
Sodium: 136 mEq/L (ref 135–145)
Total Bilirubin: 0.5 mg/dL (ref 0.2–1.2)
Total Protein: 7 g/dL (ref 6.0–8.3)

## 2019-02-16 LAB — LIPID PANEL
Cholesterol: 266 mg/dL — ABNORMAL HIGH (ref 0–200)
HDL: 38.5 mg/dL — ABNORMAL LOW (ref >39.00)
LDL Cholesterol: 192 mg/dL — ABNORMAL HIGH (ref 0–99)
NonHDL: 227.94
Total CHOL/HDL Ratio: 7
Triglycerides: 178 mg/dL — ABNORMAL HIGH (ref 0.0–149.0)
VLDL: 35.6 mg/dL (ref 0.0–40.0)

## 2019-02-16 LAB — PSA: PSA: 0.85 ng/mL (ref 0.10–4.00)

## 2019-02-16 LAB — TSH: TSH: 1.6 u[IU]/mL (ref 0.35–4.50)

## 2019-02-20 ENCOUNTER — Other Ambulatory Visit: Payer: Self-pay | Admitting: Internal Medicine

## 2019-02-20 DIAGNOSIS — E875 Hyperkalemia: Secondary | ICD-10-CM

## 2019-02-20 NOTE — Progress Notes (Signed)
Order placed for f/u stat potassium.  

## 2019-02-28 ENCOUNTER — Other Ambulatory Visit (INDEPENDENT_AMBULATORY_CARE_PROVIDER_SITE_OTHER): Payer: 59

## 2019-02-28 ENCOUNTER — Other Ambulatory Visit: Payer: Self-pay

## 2019-02-28 DIAGNOSIS — E875 Hyperkalemia: Secondary | ICD-10-CM

## 2019-02-28 LAB — POTASSIUM: Potassium: 4.6 mEq/L (ref 3.5–5.1)

## 2019-03-01 ENCOUNTER — Encounter: Payer: Self-pay | Admitting: Internal Medicine

## 2019-03-06 ENCOUNTER — Telehealth: Payer: Self-pay | Admitting: Internal Medicine

## 2019-03-07 NOTE — Telephone Encounter (Signed)
Medication Refill - Medication: ELIQUIS 5 MG TABS tablet/pravastatin (PRAVACHOL) 10 MG tablet/Pt stated he discussed changing to Pravastatin 20mg  with Dr. Nicki Reaper and would like to request rx for that. Also refill of Eliquis.  Has the patient contacted their pharmacy? Yes.   (Agent: If no, request that the patient contact the pharmacy for the refill.) (Agent: If yes, when and what did the pharmacy advise?)  Preferred Pharmacy (with phone number or street name):  [id number] [address], [place] [phone] (Phone) [phone] (Fax)     Agent: Please be advised that RX refills may take up to 3 business days. We ask that you follow-up with your pharmacy.

## 2019-03-16 ENCOUNTER — Other Ambulatory Visit: Payer: Self-pay

## 2019-03-16 MED ORDER — PRAVASTATIN SODIUM 20 MG PO TABS: 20.0000 mg | ORAL_TABLET | Freq: Every day | ORAL | 1 refills | Status: DC

## 2019-03-16 MED ORDER — APIXABAN 5 MG PO TABS: 5.0000 mg | ORAL_TABLET | Freq: Two times a day (BID) | ORAL | 2 refills | Status: DC

## 2019-03-16 NOTE — Telephone Encounter (Signed)
rx refilled.

## 2019-03-16 NOTE — Telephone Encounter (Signed)
Pt needs refill. Thank you.

## 2019-04-07 ENCOUNTER — Other Ambulatory Visit: Payer: Self-pay | Admitting: Internal Medicine

## 2019-04-25 ENCOUNTER — Other Ambulatory Visit: Payer: Self-pay

## 2019-04-25 ENCOUNTER — Encounter: Payer: Self-pay | Admitting: Internal Medicine

## 2019-04-25 ENCOUNTER — Ambulatory Visit: Payer: 59 | Admitting: Internal Medicine

## 2019-04-25 VITALS — BP 134/78 | HR 81 | Temp 97.4°F | Resp 16 | Wt 159.0 lb

## 2019-04-25 DIAGNOSIS — I10 Essential (primary) hypertension: Secondary | ICD-10-CM | POA: Diagnosis not present

## 2019-04-25 DIAGNOSIS — R1310 Dysphagia, unspecified: Secondary | ICD-10-CM

## 2019-04-25 DIAGNOSIS — R5383 Other fatigue: Secondary | ICD-10-CM

## 2019-04-25 DIAGNOSIS — K219 Gastro-esophageal reflux disease without esophagitis: Secondary | ICD-10-CM | POA: Diagnosis not present

## 2019-04-25 DIAGNOSIS — D649 Anemia, unspecified: Secondary | ICD-10-CM

## 2019-04-25 DIAGNOSIS — E7800 Pure hypercholesterolemia, unspecified: Secondary | ICD-10-CM

## 2019-04-25 DIAGNOSIS — Z85819 Personal history of malignant neoplasm of unspecified site of lip, oral cavity, and pharynx: Secondary | ICD-10-CM

## 2019-04-25 NOTE — Assessment & Plan Note (Signed)
Trouble swallowing certain foods as outlined.  Discussed eating slowly, chewing food well, taking small bites, etc.  Discussed further evaluation, given persistent symptoms and history.  He wants to hold on further testing at this time.  Will notify me if he changes his mind.  Continue omeprazole.

## 2019-04-25 NOTE — Assessment & Plan Note (Signed)
Has improved.  Follow.  Off metoprolol.

## 2019-04-25 NOTE — Assessment & Plan Note (Signed)
Last labs revealed slightly decreased hgb. Recheck cbc to confirm stable/normal.  Check B12

## 2019-04-25 NOTE — Assessment & Plan Note (Signed)
Blood pressure as outlined.  Continue current medication regimen.  Follow pressures.  Follow metabolic panel.  

## 2019-04-25 NOTE — Assessment & Plan Note (Signed)
On pravastatin.  Recent cholesterol elevated.  Pravastatin increased to 20mg  q day.  Tolerating.  Follow lipid panel and liver function tests.  Low cholesterol diet and exercise.

## 2019-04-25 NOTE — Assessment & Plan Note (Signed)
S/p chemo and XRT.  Followed by oncology.  

## 2019-04-25 NOTE — Assessment & Plan Note (Signed)
Controlled on omeprazole.   

## 2019-04-25 NOTE — Progress Notes (Signed)
[name], male   DOB: 12-Feb-1961, 58 y.o.   MRN: [id number]   Subjective:    [name], male    DOB: Aug 16, 1961, 58 y.o.   MRN: [id number]  HPI  Patient here for a scheduled follow up.  He reports he is doing relatively well.  Still some fatigue, but overall has improved.  States his blood pressure has been doing well.  Home blood pressure checks averaging 120-130/70-80.  He has noticed some difficulty swallowing.  Notices with bread and certain foods.  Has to drink increased water to get it to go down.  Has noticed since his treatment/radiation.  No regurgitation of food.  No chest pain.  No sob.  No acid reflux.  States the omeprazole controls his acid reflux.  No abdominal pain.  Bowels moving.  Discussed shingrix.  He declines flu shot.     Past Medical History:  Diagnosis Date  . Basal cell carcinoma (BCC) of back   . Basal cell carcinoma (BCC) of nasal sidewall   . Dizziness   . GERD (gastroesophageal reflux disease)   . Hypercholesterolemia   . Hypertension    hx of HBP readings  . Metastatic squamous cell carcinoma ([facility]) 2018   bx cervical lymph node   Past Surgical History:  Procedure Laterality Date  . COLONOSCOPY    . ESOPHAGOGASTRODUODENOSCOPY (EGD) WITH PROPOFOL N/A 05/24/2015   Procedure: ESOPHAGOGASTRODUODENOSCOPY (EGD) WITH PROPOFOL;  Surgeon: Manya Silvas, MD;  Location: [facility] ENDOSCOPY;  Service: Endoscopy;  Laterality: N/A;  . IR IMAGING GUIDED PORT INSERTION  12/09/2016  . SAVORY DILATION N/A 05/24/2015   Procedure: SAVORY DILATION;  Surgeon: Manya Silvas, MD;  Location: [facility] ENDOSCOPY;  Service: Endoscopy;  Laterality: N/A;   Family History  Problem Relation Age of Onset  . Hyperlipidemia Mother   . Heart disease Mother   . Hypertension Mother   . Alzheimer's disease Mother   . Prostate cancer Father   . Stroke Father    Social History   Socioeconomic History  . Marital status: Married    Spouse name: Not on  file  . Number of children: Not on file  . Years of education: Not on file  . Highest education level: Not on file  Occupational History  . Not on file  Social Needs  . Financial resource strain: Not on file  . Food insecurity    Worry: Not on file    Inability: Not on file  . Transportation needs    Medical: Not on file    Non-medical: Not on file  Tobacco Use  . Smoking status: Former Smoker    Quit date: 11/11/1996    Years since quitting: 22.4  . Smokeless tobacco: Never Used  Substance and Sexual Activity  . Alcohol use: No    Alcohol/week: 0.0 standard drinks    Comment: none for 6 months  . Drug use: No  . Sexual activity: Yes  Lifestyle  . Physical activity    Days per week: Not on file    Minutes per session: Not on file  . Stress: Not on file  Relationships  . Social Herbalist on phone: Not on file    Gets together: Not on file    Attends religious service: Not on file    Active member of club or organization: Not on file    Attends meetings of clubs or organizations: Not on file    Relationship status: Not  on file  Other Topics Concern  . Not on file  Social History Narrative  . Not on file    Outpatient Encounter Medications as of 04/25/2019  Medication Sig  . apixaban (ELIQUIS) 5 MG TABS tablet Take 1 tablet (5 mg total) by mouth every 12 (twelve) hours.  Marland Kitchen lisinopril (ZESTRIL) 20 MG tablet TAKE 1 TABLET BY MOUTH EVERY DAY  . Multiple Vitamin (MULTIVITAMIN) capsule Take by mouth.  Marland Kitchen omeprazole (PRILOSEC) 20 MG capsule TAKE 1 CAPSULE (20 MG TOTAL) BY MOUTH 2 (TWO) TIMES DAILY.  . pravastatin (PRAVACHOL) 20 MG tablet Take 1 tablet (20 mg total) by mouth daily.   No facility-administered encounter medications on file as of 04/25/2019.     Review of Systems  Constitutional: Positive for fatigue. Negative for appetite change and unexpected weight change.  HENT: Negative for congestion and sinus pressure.   Respiratory: Negative for cough, chest  tightness and shortness of breath.   Cardiovascular: Negative for chest pain, palpitations and leg swelling.  Gastrointestinal: Negative for abdominal pain, diarrhea, nausea and vomiting.  Genitourinary: Negative for difficulty urinating and dysuria.  Musculoskeletal: Negative for joint swelling and myalgias.  Skin: Negative for color change and rash.  Neurological: Negative for dizziness, light-headedness and headaches.  Psychiatric/Behavioral: Negative for agitation and dysphoric mood.       Objective:    Physical Exam Constitutional:      General: He is not in acute distress.    Appearance: Normal appearance. He is well-developed.  HENT:     Right Ear: External ear normal.     Left Ear: External ear normal.  Eyes:     General: No scleral icterus.       Right eye: No discharge.        Left eye: No discharge.     Conjunctiva/sclera: Conjunctivae normal.  Neck:     Musculoskeletal: Neck supple. No muscular tenderness.  Cardiovascular:     Rate and Rhythm: Normal rate and regular rhythm.  Pulmonary:     Effort: Pulmonary effort is normal. No respiratory distress.     Breath sounds: Normal breath sounds.  Abdominal:     General: Bowel sounds are normal.     Palpations: Abdomen is soft.     Tenderness: There is no abdominal tenderness.  Musculoskeletal:        General: No swelling or tenderness.  Neurological:     Mental Status: He is alert.  Psychiatric:        Mood and Affect: Mood normal.        Behavior: Behavior normal.     BP 134/78   Pulse 81   Temp (!) 97.4 F (36.3 C) (Temporal)   Resp 16   Wt 159 lb (72.1 kg)   SpO2 98%   BMI 24.90 kg/m  Wt Readings from Last 3 Encounters:  04/25/19 159 lb (72.1 kg)  01/20/19 158 lb 12.8 oz (72 kg)  09/21/18 159 lb (72.1 kg)     Lab Results  Component Value Date   WBC 7.8 02/16/2019   HGB 12.9 (L) 02/16/2019   HCT 39.3 02/16/2019   PLT 288.0 02/16/2019   GLUCOSE 92 02/16/2019   CHOL 266 (H) 02/16/2019    TRIG 178.0 (H) 02/16/2019   HDL 38.50 (L) 02/16/2019   LDLDIRECT 140.0 02/08/2018   LDLCALC 192 (H) 02/16/2019   ALT 29 02/16/2019   AST 23 02/16/2019   NA 136 02/16/2019   K 4.6 02/28/2019   CL 101 02/16/2019  CREATININE 1.23 02/16/2019   BUN 19 02/16/2019   CO2 27 02/16/2019   TSH 1.60 02/16/2019   PSA 0.85 02/16/2019   INR 1.1 (H) 06/02/2018        Assessment & Plan:   Problem List Items Addressed This Visit    Anemia - Primary    Last labs revealed slightly decreased hgb. Recheck cbc to confirm stable/normal.  Check B12      Relevant Orders   CBC with Differential/Platelet   Ferritin   Vitamin B12   Dysphagia    Trouble swallowing certain foods as outlined.  Discussed eating slowly, chewing food well, taking small bites, etc.  Discussed further evaluation, given persistent symptoms and history.  He wants to hold on further testing at this time.  Will notify me if he changes his mind.  Continue omeprazole.        Essential hypertension, benign    Blood pressure as outlined.  Continue current medication regimen.  Follow pressures.  Follow metabolic panel.        Relevant Orders   Basic metabolic panel   Fatigue    Has improved.  Follow.  Off metoprolol.        GERD (gastroesophageal reflux disease)    Controlled on omeprazole.        History of malignant neoplasm of oropharynx    S/p chemo and XRT.  Followed by oncology.        Hypercholesterolemia    On pravastatin.  Recent cholesterol elevated.  Pravastatin increased to 20mg  q day.  Tolerating.  Follow lipid panel and liver function tests.  Low cholesterol diet and exercise.        Relevant Orders   Hepatic function panel   Lipid panel       Einar Pheasant, MD

## 2019-05-23 ENCOUNTER — Other Ambulatory Visit: Payer: Self-pay

## 2019-05-23 ENCOUNTER — Encounter (HOSPITAL_COMMUNITY): Payer: Self-pay | Admitting: Internal Medicine

## 2019-05-23 ENCOUNTER — Observation Stay (HOSPITAL_COMMUNITY): Payer: Medicare Other

## 2019-05-23 ENCOUNTER — Emergency Department (HOSPITAL_COMMUNITY): Payer: Medicare Other

## 2019-05-23 ENCOUNTER — Observation Stay (HOSPITAL_COMMUNITY)
Admission: EM | Admit: 2019-05-23 | Discharge: 2019-05-24 | Disposition: A | Discharge status: Home / Self Care | Payer: Medicare Other | Attending: Internal Medicine | Admitting: Internal Medicine

## 2019-05-23 DIAGNOSIS — R29818 Other symptoms and signs involving the nervous system: Secondary | ICD-10-CM | POA: Insufficient documentation

## 2019-05-23 DIAGNOSIS — Z923 Personal history of irradiation: Secondary | ICD-10-CM | POA: Diagnosis not present

## 2019-05-23 DIAGNOSIS — Z85818 Personal history of malignant neoplasm of other sites of lip, oral cavity, and pharynx: Secondary | ICD-10-CM | POA: Insufficient documentation

## 2019-05-23 DIAGNOSIS — G9340 Encephalopathy, unspecified: Secondary | ICD-10-CM | POA: Diagnosis not present

## 2019-05-23 DIAGNOSIS — I340 Nonrheumatic mitral (valve) insufficiency: Secondary | ICD-10-CM | POA: Insufficient documentation

## 2019-05-23 DIAGNOSIS — I6523 Occlusion and stenosis of bilateral carotid arteries: Secondary | ICD-10-CM | POA: Diagnosis not present

## 2019-05-23 DIAGNOSIS — I10 Essential (primary) hypertension: Secondary | ICD-10-CM | POA: Diagnosis not present

## 2019-05-23 DIAGNOSIS — Z85828 Personal history of other malignant neoplasm of skin: Secondary | ICD-10-CM | POA: Diagnosis not present

## 2019-05-23 DIAGNOSIS — G9341 Metabolic encephalopathy: Secondary | ICD-10-CM | POA: Diagnosis not present

## 2019-05-23 DIAGNOSIS — R6520 Severe sepsis without septic shock: Secondary | ICD-10-CM

## 2019-05-23 DIAGNOSIS — A419 Sepsis, unspecified organism: Secondary | ICD-10-CM | POA: Diagnosis not present

## 2019-05-23 DIAGNOSIS — Z85819 Personal history of malignant neoplasm of unspecified site of lip, oral cavity, and pharynx: Secondary | ICD-10-CM | POA: Diagnosis present

## 2019-05-23 DIAGNOSIS — Z8249 Family history of ischemic heart disease and other diseases of the circulatory system: Secondary | ICD-10-CM | POA: Diagnosis not present

## 2019-05-23 DIAGNOSIS — K219 Gastro-esophageal reflux disease without esophagitis: Secondary | ICD-10-CM | POA: Insufficient documentation

## 2019-05-23 DIAGNOSIS — E7800 Pure hypercholesterolemia, unspecified: Secondary | ICD-10-CM | POA: Diagnosis not present

## 2019-05-23 DIAGNOSIS — R569 Unspecified convulsions: Secondary | ICD-10-CM | POA: Diagnosis not present

## 2019-05-23 DIAGNOSIS — R4182 Altered mental status, unspecified: Secondary | ICD-10-CM

## 2019-05-23 DIAGNOSIS — I6522 Occlusion and stenosis of left carotid artery: Secondary | ICD-10-CM | POA: Diagnosis not present

## 2019-05-23 DIAGNOSIS — I739 Peripheral vascular disease, unspecified: Secondary | ICD-10-CM | POA: Diagnosis not present

## 2019-05-23 DIAGNOSIS — G9389 Other specified disorders of brain: Secondary | ICD-10-CM | POA: Insufficient documentation

## 2019-05-23 DIAGNOSIS — R4020 Unspecified coma: Secondary | ICD-10-CM | POA: Diagnosis not present

## 2019-05-23 DIAGNOSIS — Z87891 Personal history of nicotine dependence: Secondary | ICD-10-CM | POA: Insufficient documentation

## 2019-05-23 DIAGNOSIS — Z79899 Other long term (current) drug therapy: Secondary | ICD-10-CM | POA: Insufficient documentation

## 2019-05-23 DIAGNOSIS — I6389 Other cerebral infarction: Secondary | ICD-10-CM | POA: Diagnosis not present

## 2019-05-23 DIAGNOSIS — R739 Hyperglycemia, unspecified: Secondary | ICD-10-CM | POA: Insufficient documentation

## 2019-05-23 DIAGNOSIS — Z9221 Personal history of antineoplastic chemotherapy: Secondary | ICD-10-CM | POA: Insufficient documentation

## 2019-05-23 DIAGNOSIS — G4489 Other headache syndrome: Secondary | ICD-10-CM | POA: Diagnosis not present

## 2019-05-23 DIAGNOSIS — Z7901 Long term (current) use of anticoagulants: Secondary | ICD-10-CM | POA: Diagnosis not present

## 2019-05-23 DIAGNOSIS — Z20828 Contact with and (suspected) exposure to other viral communicable diseases: Secondary | ICD-10-CM | POA: Diagnosis not present

## 2019-05-23 DIAGNOSIS — M25512 Pain in left shoulder: Secondary | ICD-10-CM | POA: Diagnosis not present

## 2019-05-23 DIAGNOSIS — R404 Transient alteration of awareness: Secondary | ICD-10-CM | POA: Diagnosis not present

## 2019-05-23 HISTORY — DX: Personal history of other venous thrombosis and embolism: Z86.718

## 2019-05-23 HISTORY — DX: Unspecified disorder of circulatory system: I99.9

## 2019-05-23 LAB — CBC
HCT: 40.2 % (ref 39.0–52.0)
Hemoglobin: 13.4 g/dL (ref 13.0–17.0)
MCH: 28.3 pg (ref 26.0–34.0)
MCHC: 33.3 g/dL (ref 30.0–36.0)
MCV: 85 fL (ref 80.0–100.0)
Platelets: 271 10*3/uL (ref 150–400)
RBC: 4.73 MIL/uL (ref 4.22–5.81)
RDW: 13.8 % (ref 11.5–15.5)
WBC: 23.1 10*3/uL — ABNORMAL HIGH (ref 4.0–10.5)
nRBC: 0 % (ref 0.0–0.2)

## 2019-05-23 LAB — I-STAT CHEM 8, ED
BUN: 21 mg/dL — ABNORMAL HIGH (ref 6–20)
Calcium, Ion: 1.17 mmol/L (ref 1.15–1.40)
Chloride: 102 mmol/L (ref 98–111)
Creatinine, Ser: 1.2 mg/dL (ref 0.61–1.24)
Glucose, Bld: 224 mg/dL — ABNORMAL HIGH (ref 70–99)
HCT: 41 % (ref 39.0–52.0)
Hemoglobin: 13.9 g/dL (ref 13.0–17.0)
Potassium: 3.5 mmol/L (ref 3.5–5.1)
Sodium: 135 mmol/L (ref 135–145)
TCO2: 16 mmol/L — ABNORMAL LOW (ref 22–32)

## 2019-05-23 LAB — LACTIC ACID, PLASMA
Lactic Acid, Venous: 5 mmol/L (ref 0.5–1.9)
Lactic Acid, Venous: 1.6 mmol/L (ref 0.5–1.9)
Lactic Acid, Venous: 1.8 mmol/L (ref 0.5–1.9)

## 2019-05-23 LAB — COMPREHENSIVE METABOLIC PANEL
ALT: 46 U/L — ABNORMAL HIGH (ref 0–44)
AST: 38 U/L (ref 15–41)
Albumin: 4.2 g/dL (ref 3.5–5.0)
Alkaline Phosphatase: 64 U/L (ref 38–126)
Anion gap: 20 — ABNORMAL HIGH (ref 5–15)
BUN: 20 mg/dL (ref 6–20)
CO2: 15 mmol/L — ABNORMAL LOW (ref 22–32)
Calcium: 9.6 mg/dL (ref 8.9–10.3)
Chloride: 99 mmol/L (ref 98–111)
Creatinine, Ser: 1.39 mg/dL — ABNORMAL HIGH (ref 0.61–1.24)
GFR calc Af Amer: >60 mL/min (ref >60)
GFR calc non Af Amer: 55 mL/min — ABNORMAL LOW (ref >60)
Glucose, Bld: 224 mg/dL — ABNORMAL HIGH (ref 70–99)
Potassium: 3.6 mmol/L (ref 3.5–5.1)
Sodium: 134 mmol/L — ABNORMAL LOW (ref 135–145)
Total Bilirubin: 0.8 mg/dL (ref 0.3–1.2)
Total Protein: 7.6 g/dL (ref 6.5–8.1)

## 2019-05-23 LAB — DIFFERENTIAL
Abs Immature Granulocytes: 0.24 10*3/uL — ABNORMAL HIGH (ref 0.00–0.07)
Basophils Absolute: 0.1 10*3/uL (ref 0.0–0.1)
Basophils Relative: 0 %
Eosinophils Absolute: 0.1 10*3/uL (ref 0.0–0.5)
Eosinophils Relative: 1 %
Immature Granulocytes: 1 %
Lymphocytes Relative: 11 %
Lymphs Abs: 2.6 10*3/uL (ref 0.7–4.0)
Monocytes Absolute: 1.1 10*3/uL — ABNORMAL HIGH (ref 0.1–1.0)
Monocytes Relative: 5 %
Neutro Abs: 19 10*3/uL — ABNORMAL HIGH (ref 1.7–7.7)
Neutrophils Relative %: 82 %

## 2019-05-23 LAB — URINALYSIS, ROUTINE W REFLEX MICROSCOPIC
Bilirubin Urine: NEGATIVE
Glucose, UA: 150 mg/dL — AB
Hgb urine dipstick: NEGATIVE
Ketones, ur: 5 mg/dL — AB
Leukocytes,Ua: NEGATIVE
Nitrite: NEGATIVE
Protein, ur: NEGATIVE mg/dL
Specific Gravity, Urine: 1.034 — ABNORMAL HIGH (ref 1.005–1.030)
pH: 5 (ref 5.0–8.0)

## 2019-05-23 LAB — RAPID URINE DRUG SCREEN, HOSP PERFORMED
Amphetamines: NOT DETECTED
Barbiturates: NOT DETECTED
Benzodiazepines: NOT DETECTED
Cocaine: NOT DETECTED
Opiates: NOT DETECTED
Tetrahydrocannabinol: NOT DETECTED

## 2019-05-23 LAB — HEMOGLOBIN A1C
Hgb A1c MFr Bld: 6 % — ABNORMAL HIGH (ref 4.8–5.6)
Mean Plasma Glucose: 125.5 mg/dL

## 2019-05-23 LAB — PROTIME-INR
INR: 1.1 (ref 0.8–1.2)
Prothrombin Time: 14.4 seconds (ref 11.4–15.2)

## 2019-05-23 LAB — CBG MONITORING, ED: Glucose-Capillary: 108 mg/dL — ABNORMAL HIGH (ref 70–99)

## 2019-05-23 LAB — PROCALCITONIN: Procalcitonin: 0.18 ng/mL

## 2019-05-23 LAB — SARS CORONAVIRUS 2 (TAT 6-24 HRS): SARS Coronavirus 2: NEGATIVE

## 2019-05-23 LAB — APTT: aPTT: 30 seconds (ref 24–36)

## 2019-05-23 LAB — HIV ANTIBODY (ROUTINE TESTING W REFLEX): HIV Screen 4th Generation wRfx: NONREACTIVE

## 2019-05-23 LAB — ETHANOL: Alcohol, Ethyl (B): <10 mg/dL (ref <10)

## 2019-05-23 MED ORDER — SODIUM CHLORIDE 0.9 % IV SOLN: INTRAVENOUS | Status: DC

## 2019-05-23 MED ORDER — SODIUM CHLORIDE 0.9 % IV BOLUS (SEPSIS)
1000.0000 mL | Freq: Once | INTRAVENOUS | Status: AC
  Administered 2019-05-23: 20:00:00 1000 mL via INTRAVENOUS

## 2019-05-23 MED ORDER — SODIUM CHLORIDE 0.9 % IV SOLN: 2.0000 g | Freq: Once | INTRAVENOUS | Status: DC

## 2019-05-23 MED ORDER — ACETAMINOPHEN 160 MG/5ML PO SOLN: 650.0000 mg | ORAL | Status: DC | PRN

## 2019-05-23 MED ORDER — ASPIRIN 325 MG PO TABS
325.0000 mg | ORAL_TABLET | Freq: Every day | ORAL | Status: DC
  Administered 2019-05-23 – 2019-05-24 (×2): 325 mg via ORAL
  Filled 2019-05-23 (×2): qty 1

## 2019-05-23 MED ORDER — ACETAMINOPHEN 325 MG PO TABS: 650.0000 mg | ORAL_TABLET | ORAL | Status: DC | PRN

## 2019-05-23 MED ORDER — ENOXAPARIN SODIUM 40 MG/0.4ML ~~LOC~~ SOLN: 40.0000 mg | SUBCUTANEOUS | Status: DC

## 2019-05-23 MED ORDER — SODIUM CHLORIDE 0.9 % IV BOLUS
1000.0000 mL | Freq: Once | INTRAVENOUS | Status: AC
  Administered 2019-05-23: 1000 mL via INTRAVENOUS

## 2019-05-23 MED ORDER — ONDANSETRON HCL 4 MG/2ML IJ SOLN
4.0000 mg | Freq: Four times a day (QID) | INTRAMUSCULAR | Status: DC | PRN
  Administered 2019-05-23: 4 mg via INTRAVENOUS
  Filled 2019-05-23: qty 2

## 2019-05-23 MED ORDER — SODIUM CHLORIDE 0.9 % IV BOLUS (SEPSIS)
250.0000 mL | Freq: Once | INTRAVENOUS | Status: AC
  Administered 2019-05-23: 250 mL via INTRAVENOUS

## 2019-05-23 MED ORDER — STROKE: EARLY STAGES OF RECOVERY BOOK: Freq: Once | Status: DC

## 2019-05-23 MED ORDER — ACETAMINOPHEN 650 MG RE SUPP: 650.0000 mg | RECTAL | Status: DC | PRN

## 2019-05-23 MED ORDER — VANCOMYCIN HCL 10 G IV SOLR
1750.0000 mg | Freq: Once | INTRAVENOUS | Status: AC
  Administered 2019-05-23: 20:00:00 1750 mg via INTRAVENOUS
  Filled 2019-05-23: qty 1750

## 2019-05-23 MED ORDER — VANCOMYCIN HCL IN DEXTROSE 1-5 GM/200ML-% IV SOLN: 1000.0000 mg | Freq: Once | INTRAVENOUS | Status: DC

## 2019-05-23 MED ORDER — VANCOMYCIN HCL 10 G IV SOLR
1500.0000 mg | INTRAVENOUS | Status: DC
  Filled 2019-05-23: qty 1500

## 2019-05-23 MED ORDER — INSULIN ASPART 100 UNIT/ML ~~LOC~~ SOLN: 0.0000 [IU] | Freq: Three times a day (TID) | SUBCUTANEOUS | Status: DC

## 2019-05-23 MED ORDER — IOHEXOL 350 MG/ML SOLN
75.0000 mL | Freq: Once | INTRAVENOUS | Status: AC | PRN
  Administered 2019-05-23: 75 mL via INTRAVENOUS

## 2019-05-23 MED ORDER — SODIUM CHLORIDE 0.9 % IV SOLN
INTRAVENOUS | Status: DC
  Administered 2019-05-23 – 2019-05-24 (×4): via INTRAVENOUS

## 2019-05-23 MED ORDER — LISINOPRIL 20 MG PO TABS
20.0000 mg | ORAL_TABLET | Freq: Every day | ORAL | Status: DC
  Administered 2019-05-24: 08:00:00 20 mg via ORAL
  Filled 2019-05-23: qty 1

## 2019-05-23 MED ORDER — SODIUM CHLORIDE 0.9% FLUSH
3.0000 mL | Freq: Once | INTRAVENOUS | Status: AC
  Administered 2019-05-23: 16:00:00 3 mL via INTRAVENOUS

## 2019-05-23 MED ORDER — METRONIDAZOLE IN NACL 5-0.79 MG/ML-% IV SOLN
500.0000 mg | Freq: Three times a day (TID) | INTRAVENOUS | Status: DC
  Administered 2019-05-23 – 2019-05-24 (×3): 500 mg via INTRAVENOUS
  Filled 2019-05-23 (×3): qty 100

## 2019-05-23 MED ORDER — PANTOPRAZOLE SODIUM 40 MG PO TBEC
40.0000 mg | DELAYED_RELEASE_TABLET | Freq: Every day | ORAL | Status: DC
  Administered 2019-05-24: 40 mg via ORAL
  Filled 2019-05-23: qty 1

## 2019-05-23 MED ORDER — ATORVASTATIN CALCIUM 40 MG PO TABS: 40.0000 mg | ORAL_TABLET | Freq: Every day | ORAL | Status: DC

## 2019-05-23 MED ORDER — APIXABAN 5 MG PO TABS
5.0000 mg | ORAL_TABLET | Freq: Two times a day (BID) | ORAL | Status: DC
  Administered 2019-05-23 – 2019-05-24 (×2): 5 mg via ORAL
  Filled 2019-05-23 (×2): qty 1

## 2019-05-23 MED ORDER — SODIUM CHLORIDE 0.9 % IV SOLN
2.0000 g | Freq: Three times a day (TID) | INTRAVENOUS | Status: DC
  Administered 2019-05-23 – 2019-05-24 (×3): 2 g via INTRAVENOUS
  Filled 2019-05-23 (×3): qty 2

## 2019-05-23 MED ORDER — SENNOSIDES-DOCUSATE SODIUM 8.6-50 MG PO TABS: 1.0000 | ORAL_TABLET | Freq: Every evening | ORAL | Status: DC | PRN

## 2019-05-23 MED ORDER — INSULIN ASPART 100 UNIT/ML ~~LOC~~ SOLN: 0.0000 [IU] | Freq: Every day | SUBCUTANEOUS | Status: DC

## 2019-05-23 MED ORDER — ASPIRIN 300 MG RE SUPP: 300.0000 mg | Freq: Every day | RECTAL | Status: DC

## 2019-05-23 NOTE — Sepsis Progress Note (Signed)
Notified bedside nurse of need to draw repeat lactic acid and blood cultures now.

## 2019-05-23 NOTE — Consult Note (Signed)
Stroke Neurology Consultation Note  Consult Requested by: Dr. Regenia Skeeter  Reason for Consult: HA, confusion, nausea vomiting, imbalance  Consult Date: 05/23/19  The history was obtained from the pt and EMS.  During history and examination, all items were able to obtain unless otherwise noted.  History of Present Illness:  Timothy Garrison is a 58 y.o. Caucasian male with PMH of HTN, PVD, tonsillar cancer s/p chemo and radiation, HLD, on eliquis not sure why presented as code stroke.   As per EMS and pt, he was in his garage changing oil, then he had acute onset dizziness, more lightheadedness, feeling nausea and dry hive, his neighbor helped him sat down, found him imbalanace on his feet and slurry his words. He also complained right frontal HA. He denies LOC but stated that he felt he is going to pass out. He denies shaking jerking but EMS said his neighbor said he had some arm shaking movement, no frank seizure. EMS was called. On arrival, pt did not quite following commands. Code stroke called.   On bridge, pt disorientated, not able to tell me the time or age, not knowing why he is taking eliquis but stated last dose likely this morning but definitely took last night. He is following commands, and no focal deficit. BP 180/100 and glucose 155.  As per chart, he had stage I tonsillar cancer s/p chemo and radiation, following with oncology. Hx of PVD s/p fem-fem bypass, not sure if this is what he takes eliquis for. Hx of HTN and HLD on pravastatin. Denies smoking, alcohol or illicit drugs.   LSN: 1pm tPA Given: No: non stroke, low NIHSS, on eliquis   Past Medical History:  Diagnosis Date  . Basal cell carcinoma (BCC) of back   . Basal cell carcinoma (BCC) of nasal sidewall   . Dizziness   . GERD (gastroesophageal reflux disease)   . Hypercholesterolemia   . Hypertension    hx of HBP readings  . Metastatic squamous cell carcinoma ([place]) 2018   bx cervical lymph node    Past Surgical  History:  Procedure Laterality Date  . COLONOSCOPY    . ESOPHAGOGASTRODUODENOSCOPY (EGD) WITH PROPOFOL N/A 05/24/2015   Procedure: ESOPHAGOGASTRODUODENOSCOPY (EGD) WITH PROPOFOL;  Surgeon: Manya Silvas, MD;  Location: [facility] ENDOSCOPY;  Service: Endoscopy;  Laterality: N/A;  . IR IMAGING GUIDED PORT INSERTION  12/09/2016  . SAVORY DILATION N/A 05/24/2015   Procedure: SAVORY DILATION;  Surgeon: Manya Silvas, MD;  Location: [facility] ENDOSCOPY;  Service: Endoscopy;  Laterality: N/A;    Family History  Problem Relation Age of Onset  . Hyperlipidemia Mother   . Heart disease Mother   . Hypertension Mother   . Alzheimer's disease Mother   . Prostate cancer Father   . Stroke Father     Social History:  reports that he quit smoking about 22 years ago. He has never used smokeless tobacco. He reports that he does not drink alcohol or use drugs.  Allergies: No Known Allergies  No current facility-administered medications on file prior to encounter.    Current Outpatient Medications on File Prior to Encounter  Medication Sig Dispense Refill  . apixaban (ELIQUIS) 5 MG TABS tablet Take 1 tablet (5 mg total) by mouth every 12 (twelve) hours. 180 tablet 2  . lisinopril (ZESTRIL) 20 MG tablet TAKE 1 TABLET BY MOUTH EVERY DAY 30 tablet 11  . Multiple Vitamin (MULTIVITAMIN) capsule Take by mouth.    Marland Kitchen omeprazole (PRILOSEC) 20 MG capsule TAKE  1 CAPSULE (20 MG TOTAL) BY MOUTH 2 (TWO) TIMES DAILY. (Patient taking differently: Take 20 mg by mouth daily. ) 180 capsule 2  . pravastatin (PRAVACHOL) 20 MG tablet Take 1 tablet (20 mg total) by mouth daily. 90 tablet 1    Review of Systems: A full ROS was attempted today and was able to be performed.  Systems assessed include - Constitutional, Eyes, HENT, Respiratory, Cardiovascular, Gastrointestinal, Genitourinary, Integument/breast, Hematologic/lymphatic, Musculoskeletal, Neurological, Behavioral/Psych, Endocrine, Allergic/Immunologic - with pertinent  responses as per HPI.  Physical Examination: Temp:  [97.5 F (36.4 C)] 97.5 F (36.4 C) (10/06 1454) Pulse Rate:  [124] 124 (10/06 1454) Resp:  [16] 16 (10/06 1454) BP: (157)/(81) 157/81 (10/06 1454) SpO2:  [99 %] 99 % (10/06 1454)  General - well nourished, well developed, in no apparent distress.    Ophthalmologic - fundi not visualized due to noncooperation.    Cardiovascular - regular rhythm, but tachycardia  Mental Status -  Level of arousal and orientation toplace, and person were intact, but not to time or age. Language including expression, naming, repetition, comprehension was assessed and found intact.  Cranial Nerves II - XII - II - Vision intact OU. III, IV, VI - Extraocular movements intact. V - Facial sensation intact bilaterally. VII - Facial movement intact bilaterally. VIII - Hearing & vestibular intact bilaterally. X - Palate elevates symmetrically. XI - Chin turning & shoulder shrug intact bilaterally. XII - Tongue protrusion intact.  Motor Strength - The patient's strength was 4/5 in all extremities and pronator drift was absent.   Motor Tone & Bulk - Muscle tone was assessed at the neck and appendages and was normal.  Bulk was normal and fasciculations were absent.   Reflexes - The patient's reflexes were normal in all extremities and he had no pathological reflexes.  Sensory - Light touch, temperature/pinprick were assessed and were normal.    Coordination - The patient had normal movements in the hands with no ataxia or dysmetria.  Tremor was absent.  Gait and Station - deferred  NIH Stroke Scale  Level Of Consciousness 0=Alert; keenly responsive 1=Arouse to minor stimulation 2=Requires repeated stimulation to arouse or movements to pain 3=postures or unresponsive 0  LOC Questions to Month and Age 43=Answers both questions correctly 1=Answers one question correctly or dysarthria/intubated/trauma/language barrier 2=Answers neither question  correctly or aphasia 2  LOC Commands      -Open/Close eyes     -Open/close grip     -Pantomime commands if communication barrier 0=Performs both tasks correctly 1=Performs one task correctly 2=Performs neighter task correctly 0  Best Gaze     -Only assess horizontal gaze 0=Normal 1=Partial gaze palsy 2=Forced deviation, or total gaze paresis 0  Visual 0=No visual loss 1=Partial hemianopia 2=Complete hemianopia 3=Bilateral hemianopia (blind including cortical blindness) 0  Facial Palsy     -Use grimace if obtunded 0=Normal symmetrical movement 1=Minor paralysis (asymmetry) 2=Partial paralysis (lower face) 3=Complete paralysis (upper and lower face) 0  Motor  0=No drift for 10/5 seconds 1=Drift, but does not hit bed 2=Some antigravity effort, hits  bed 3=No effort against gravity, limb falls 4=No movement 0=Amputation/joint fusion Right Arm 0     Leg 0    Left Arm 0     Leg 0  Limb Ataxia     - FNT/HTS 0=Absent or does not understand or paralyzed or amputation/joint fusion 1=Present in one limb 2=Present in two limbs 0  Sensory 0=Normal 1=Mild to moderate sensory loss 2=Severe to total sensory loss  or coma/unresponsive 0  Best Language 0=No aphasia, normal 1=Mild to moderate aphasia 2=Severe aphasia 3=Mute, global aphasia, or coma/unresponsive 0  Dysarthria 0=Normal 1=Mild to moderate 2=Severe, unintelligible or mute/anarthric 0=intubated/unable to test 0  Extinction/Neglect 0=No abnormality 1=visual/tactile/auditory/spatia/personal inattention/Extinction to bilateral simultaneous stimulation 2=Profound neglect/extinction more than 1 modality  0  Total   2     Data Reviewed: Ct Angio Head W Or Wo Contrast  Result Date: 05/23/2019 CLINICAL DATA:  58 year old male code stroke presentation. EXAM: CT ANGIOGRAPHY HEAD AND NECK TECHNIQUE: Multidetector CT imaging of the head and neck was performed using the standard protocol during bolus administration of intravenous  contrast. Multiplanar CT image reconstructions and MIPs were obtained to evaluate the vascular anatomy. Carotid stenosis measurements (when applicable) are obtained utilizing NASCET criteria, using the distal internal carotid diameter as the denominator. CONTRAST:  38mL OMNIPAQUE IOHEXOL 350 MG/ML SOLN COMPARISON:  Plain head CT 1430 hours today. FINDINGS: CTA NECK Skeleton: No acute osseous abnormality identified. Upper chest: Negative. Other neck: No acute findings. Aortic arch: Moderate for age Calcified aortic atherosclerosis. Bovine type arch configuration. Right carotid system: No brachiocephalic artery or right CCA origin stenosis despite plaque. Plaque in the right CCA proximal to the bifurcation including abundant soft plaque medially at the level of the hypopharynx on series 5, image 113, but no significant stenosis. Additional calcified plaque at the right ICA origin and bulb but no significant stenosis. Left carotid system: Bovine type left CCA origin with about 50% stenosis due to calcified plaque (series 5, image 154). Intermittent soft plaque in the left CCA without stenosis proximal to the bifurcation. At the bifurcation there is bulky calcified plaque affecting the posterior left ICA origin and bulb. Stenosis is numerically estimated at 60 % with respect to the distal vessel on series 9, image 137. Patent left ICA to the skull base. Vertebral arteries: No significant proximal right subclavian artery stenosis despite soft and calcified plaque. Similar plaque at the right vertebral artery origin and V1 segment with moderate stenosis (series 7, images 282 through 273). The right vertebral is dominant and patent to the skull base without additional stenosis, despite some V3 segment calcified plaque. No proximal left subclavian artery stenosis despite plaque. Diminutive left vertebral artery with a normal origin on series 7, image 282. The left vertebral remains diminutive but patent to the skull base.  CTA HEAD Posterior circulation: Dominant right vertebral artery with mild V4 segment calcified plaque and no stenosis. The right vertebral supplies the basilar. Patent right PICA origin. Diminutive left vertebral artery which appears to functionally terminates in the PICA. Patent but diminutive and mildly irregular basilar artery, no focal basilar stenosis. Patent SCA and left PCA origin. Fetal right PCA origin. Bilateral PCA branches are within normal limits. Anterior circulation: Both ICA siphons are patent. On the left there is moderate calcified plaque with only mild stenosis. On the right there is mild to moderate calcified plaque and similar mild supraclinoid stenosis. Normal right ophthalmic and posterior communicating artery origins. Patent carotid termini, MCA and ACA origins. Anterior communicating artery and bilateral ACA branches are within normal limits. Left MCA M1 segment and early bifurcation are patent without stenosis. Left MCA branches appear patent with mild irregularity. Right MCA M1 and bifurcation are patent without stenosis. Right MCA branches are patent with similar mild irregularity. Venous sinuses: Early contrast phase, not well evaluated. Anatomic variants: Dominant right vertebral artery which supplies the basilar. Diminutive left vertebral functionally terminates in PICA. Fetal right PCA origin. Review  of the MIP images confirms the above findings IMPRESSION: 1. Negative for large vessel occlusion. 2. Positive for abundant atherosclerotic plaque in the proximal great vessels, at the carotid bifurcations, and V1 segment of the dominant right vertebral artery. Proximal left ICA stenosis estimated at 60%. Moderate proximal right vertebral artery stenosis. 3. Intracranial atherosclerosis including bilateral calcified ICA siphons but no significant intracranial stenosis. 4. Aortic Atherosclerosis (ICD10-I70.0). These results were communicated to Dr. Erlinda Hong At 3:06 pm on 05/23/2019 by text page  via the [facility] messaging system. Electronically Signed   By: Genevie Ann M.D.   On: 05/23/2019 15:06   Ct Angio Neck W Or Wo Contrast  Result Date: 05/23/2019 CLINICAL DATA:  58 year old male code stroke presentation. EXAM: CT ANGIOGRAPHY HEAD AND NECK TECHNIQUE: Multidetector CT imaging of the head and neck was performed using the standard protocol during bolus administration of intravenous contrast. Multiplanar CT image reconstructions and MIPs were obtained to evaluate the vascular anatomy. Carotid stenosis measurements (when applicable) are obtained utilizing NASCET criteria, using the distal internal carotid diameter as the denominator. CONTRAST:  73mL OMNIPAQUE IOHEXOL 350 MG/ML SOLN COMPARISON:  Plain head CT 1430 hours today. FINDINGS: CTA NECK Skeleton: No acute osseous abnormality identified. Upper chest: Negative. Other neck: No acute findings. Aortic arch: Moderate for age Calcified aortic atherosclerosis. Bovine type arch configuration. Right carotid system: No brachiocephalic artery or right CCA origin stenosis despite plaque. Plaque in the right CCA proximal to the bifurcation including abundant soft plaque medially at the level of the hypopharynx on series 5, image 113, but no significant stenosis. Additional calcified plaque at the right ICA origin and bulb but no significant stenosis. Left carotid system: Bovine type left CCA origin with about 50% stenosis due to calcified plaque (series 5, image 154). Intermittent soft plaque in the left CCA without stenosis proximal to the bifurcation. At the bifurcation there is bulky calcified plaque affecting the posterior left ICA origin and bulb. Stenosis is numerically estimated at 60 % with respect to the distal vessel on series 9, image 137. Patent left ICA to the skull base. Vertebral arteries: No significant proximal right subclavian artery stenosis despite soft and calcified plaque. Similar plaque at the right vertebral artery origin and V1 segment with  moderate stenosis (series 7, images 282 through 273). The right vertebral is dominant and patent to the skull base without additional stenosis, despite some V3 segment calcified plaque. No proximal left subclavian artery stenosis despite plaque. Diminutive left vertebral artery with a normal origin on series 7, image 282. The left vertebral remains diminutive but patent to the skull base. CTA HEAD Posterior circulation: Dominant right vertebral artery with mild V4 segment calcified plaque and no stenosis. The right vertebral supplies the basilar. Patent right PICA origin. Diminutive left vertebral artery which appears to functionally terminates in the PICA. Patent but diminutive and mildly irregular basilar artery, no focal basilar stenosis. Patent SCA and left PCA origin. Fetal right PCA origin. Bilateral PCA branches are within normal limits. Anterior circulation: Both ICA siphons are patent. On the left there is moderate calcified plaque with only mild stenosis. On the right there is mild to moderate calcified plaque and similar mild supraclinoid stenosis. Normal right ophthalmic and posterior communicating artery origins. Patent carotid termini, MCA and ACA origins. Anterior communicating artery and bilateral ACA branches are within normal limits. Left MCA M1 segment and early bifurcation are patent without stenosis. Left MCA branches appear patent with mild irregularity. Right MCA M1 and bifurcation are patent  without stenosis. Right MCA branches are patent with similar mild irregularity. Venous sinuses: Early contrast phase, not well evaluated. Anatomic variants: Dominant right vertebral artery which supplies the basilar. Diminutive left vertebral functionally terminates in PICA. Fetal right PCA origin. Review of the MIP images confirms the above findings IMPRESSION: 1. Negative for large vessel occlusion. 2. Positive for abundant atherosclerotic plaque in the proximal great vessels, at the carotid  bifurcations, and V1 segment of the dominant right vertebral artery. Proximal left ICA stenosis estimated at 60%. Moderate proximal right vertebral artery stenosis. 3. Intracranial atherosclerosis including bilateral calcified ICA siphons but no significant intracranial stenosis. 4. Aortic Atherosclerosis (ICD10-I70.0). These results were communicated to Dr. Erlinda Hong At 3:06 pm on 05/23/2019 by text page via the [facility] messaging system. Electronically Signed   By: Genevie Ann M.D.   On: 05/23/2019 15:06   Ct Head Code Stroke Wo Contrast  Result Date: 05/23/2019 CLINICAL DATA:  Code stroke. 58 year old male with headache and difficulty following commands. Last seen normal 1300 hours. EXAM: CT HEAD WITHOUT CONTRAST TECHNIQUE: Contiguous axial images were obtained from the base of the skull through the vertex without intravenous contrast. COMPARISON:  Brain MRI 07/09/2012 [facility], head CT 04/06/2011. FINDINGS: Brain: Chronic appearing encephalomalacia along the lateral right temporal lobe, at the confluence with the inferior right parietal and anterior occipital lobes. Evidence of laminar necrosis. Small chronic appearing infarct in the left cerebellum on series 3, image 11. Elsewhere normal gray-white matter differentiation. No midline shift, ventriculomegaly, mass effect, evidence of mass lesion, intracranial hemorrhage or evidence of cortically based acute infarction. Vascular: Calcified atherosclerosis at the skull base. No suspicious intracranial vascular hyperdensity. Skull: No acute osseous abnormality identified. Sinuses/Orbits: Chronic left posterior ethmoid mucosal thickening, otherwise well pneumatized paranasal sinuses and mastoids. Other: Visualized orbits and scalp soft tissues are within normal limits. ASPECTS [facility] Stroke Program Early CT Score) Total score (0-10 with 10 being normal): 10 (chronic encephalomalacia in the right hemisphere). IMPRESSION: 1. No acute cortically based  infarct or acute intracranial hemorrhage identified. 2. New since 2012 but Chronic appearing infarcts of the lateral right temporal lobe (moderate size) and the left cerebellum (small). 3.  ASPECTS 10. 4. These results were communicated to Dr. Erlinda Hong at 2:34 pm on 05/23/2019 by text page via the [facility] messaging system. Electronically Signed   By: Genevie Ann M.D.   On: 05/23/2019 14:35    Assessment: 58 y.o. male with PMH of HTN, PVD s/p fem-fem bypass, tonsillar cancer s/p chemo and radiation, HLD, on eliquis (not sure why) presented with acute onset lightheadedness, N/V, imbalanace walking, slurry speech, HA and not following commands. On arrival, disorientation, but no focal deficit, following commands and slurry speech much improved. Still has HA and vomited once. NIHSS = 2. Pt not tPA candidate as he is on eliquis, last dose likely this am. CTA no LVO but moderate to high grade stenosis left ICA and dominant right VA. His WBC was high and bicarb was low. Etiology not quite clear, but concerning for encephalopathy, presyncope with vascular athero. Stroke and seizure less likely but will do MRI and EEG for further evaluation.   Stroke Risk Factors - hyperlipidemia, hypertension and hyperglycemia and PVD  Plan: - admit to [facility] for further work up - HgbA1c, fasting lipid panel - MRI of the brain without contrast - EEG - encephalopathy work up - PT consult, OT consult, Speech consult - Echocardiogram - if BP under control, can continue eliquis - Risk factor modification -  Telemetry monitoring - Frequent neuro checks  Thank you for this consultation and allowing [place] to participate in the care of this patient.  Rosalin Hawking, MD PhD Stroke Neurology 05/23/2019 3:23 PM

## 2019-05-23 NOTE — Progress Notes (Addendum)
Pharmacy Antibiotic Note  Timothy Garrison is a 58 y.o. male admitted on 05/23/2019 with sepsis. He has PMH of HTN, PAD, metastatic sqamous cell tonsillar cancer s/p XRT. He started having episodes of dizziness and vomiting this morning with mild confusion. Initially there was concern for a CVA with a MRI pending. Patient also presented with SIRS and evidence of acute organ failure with lactate of 5.  Pharmacy has been consulted for vancomycin/cefepime dosing.  Pt's WBC is elevated at 23.1. He is currently afebrile. Blood cultures have been sent.  Plan: Vancomycin 1,750 mg IV X 1 as loading dose Vancomycin 1,500 mg IV every 24 hours. Goal AUC 400-550. Expected AUC: 490 SCr used: 1.2 Cefepime 2 gm IV q8hours Monitor renal function, Clinical status, WBC, temp, and cultures  Height: 5\' 8"  (172.7 cm) Weight: 160 lb 11.5 oz (72.9 kg) IBW/kg (Calculated) : 68.4  Temp (24hrs), Avg:97.9 F (36.6 C), Min:97.5 F (36.4 C), Max:98.3 F (36.8 C)  Recent Labs  Lab 05/23/19 1418 05/23/19 1428 05/23/19 1515  WBC 23.1*  --   --   CREATININE 1.39* 1.20  --   LATICACIDVEN  --   --  5.0*    Estimated Creatinine Clearance: 64.9 mL/min (by C-G formula based on SCr of 1.2 mg/dL).    No Known Allergies  Antimicrobials this admission: Vancomycin 10/06>> Cefepime 10/06>>   Microbiology results: 10/06 BCx: Sent 10/06 UCx: Sent 10/06 COVID: Sent  Thank you for allowing pharmacy to be a part of this patient's care.  Sherren Kerns, PharmD PGY1 Acute Care Pharmacy Resident 05/23/2019 6:11 PM

## 2019-05-23 NOTE — Progress Notes (Signed)
EEG complete - results pending 

## 2019-05-23 NOTE — ED Provider Notes (Signed)
[facility] DE[facility] Provider Note   CSN: [id number] Arrival date & time: 05/23/19  1416  An emergency department physician performed an initial assessment on this suspected stroke patient at 1420. LEVEL 5 CAVEAT - ALTERED MENTAL STATUS   History   Chief Complaint Chief Complaint  Patient presents with   Code Stroke    HPI Timothy Garrison is a 58 y.o. male.     HPI 58 year old male presents with acute confusion and headache.  The history is limited because the patient seems confused and does not answer all questions appropriately.  EMS reports that he was last known normal at 1 PM.  He was outside working.  Was apparently in the shade.  He then at some point developed headache and altered mental status.  There was witnesses that stated he was also having some sort of shaking activity but he did not describe it as a classic seizure.  Patient feels nauseated.  EMS reported no focal weakness but he has diffuse weakness.  He had slurred speech that is improving.  Past Medical History:  Diagnosis Date   Basal cell carcinoma (BCC) of back    Basal cell carcinoma (BCC) of nasal sidewall    Dizziness    GERD (gastroesophageal reflux disease)    Hypercholesterolemia    Hypertension    hx of HBP readings   Metastatic squamous cell carcinoma ([place]) 2018   bx cervical lymph node    Patient Active Problem List   Diagnosis Date Noted   Dysphagia 04/25/2019   Anemia 04/25/2019   Left shoulder pain 05/29/2018   Fatigue 02/06/2018   Aortic occlusion ([place]) 04/26/2017   Xerostomia 04/26/2017   Goals of care, counseling/discussion 12/25/2016   History of malignant neoplasm of oropharynx 12/07/2016   Neck mass 10/07/2016   Health care maintenance 11/03/2014   Daytime somnolence 04/15/2014   Neck pain 09/16/2013   GERD (gastroesophageal reflux disease) 01/23/2013   Dizziness 01/23/2013   Essential hypertension, benign 01/17/2013    Hypercholesterolemia 01/17/2013    Past Surgical History:  Procedure Laterality Date   COLONOSCOPY     ESOPHAGOGASTRODUODENOSCOPY (EGD) WITH PROPOFOL N/A 05/24/2015   Procedure: ESOPHAGOGASTRODUODENOSCOPY (EGD) WITH PROPOFOL;  Surgeon: Manya Silvas, MD;  Location: [facility];  Service: Endoscopy;  Laterality: N/A;   IR IMAGING GUIDED PORT INSERTION  12/09/2016   SAVORY DILATION N/A 05/24/2015   Procedure: SAVORY DILATION;  Surgeon: Manya Silvas, MD;  Location: [facility] ENDOSCOPY;  Service: Endoscopy;  Laterality: N/A;        Home Medications    Prior to Admission medications   Medication Sig Start Date End Date Taking? Authorizing Provider  apixaban (ELIQUIS) 5 MG TABS tablet Take 1 tablet (5 mg total) by mouth every 12 (twelve) hours. 03/16/19  Yes Einar Pheasant, MD  lisinopril (ZESTRIL) 20 MG tablet TAKE 1 TABLET BY MOUTH EVERY DAY 01/10/19  Yes Einar Pheasant, MD  Multiple Vitamin (MULTIVITAMIN) capsule Take by mouth. 12/04/10  Yes [provider]  omeprazole (PRILOSEC) 20 MG capsule TAKE 1 CAPSULE (20 MG TOTAL) BY MOUTH 2 (TWO) TIMES DAILY. Patient taking differently: Take 20 mg by mouth daily.  04/07/19  Yes Einar Pheasant, MD  pravastatin (PRAVACHOL) 20 MG tablet Take 1 tablet (20 mg total) by mouth daily. 03/16/19  Yes Einar Pheasant, MD    Family History Family History  Problem Relation Age of Onset   Hyperlipidemia Mother    Heart disease Mother    Hypertension Mother  Alzheimer's disease Mother    Prostate cancer Father    Stroke Father     Social History Social History   Tobacco Use   Smoking status: Former Smoker    Quit date: 11/11/1996    Years since quitting: 22.5   Smokeless tobacco: Never Used  Substance Use Topics   Alcohol use: No    Alcohol/week: 0.0 standard drinks    Comment: none for 6 months   Drug use: No     Allergies   Patient has no known allergies.   Review of Systems Review of Systems  Unable to  perform ROS: Mental status change     Physical Exam Updated Vital Signs BP (!) 157/81 (BP Location: Right Arm)    Pulse (!) 124    Temp (!) 97.5 F (36.4 C) (Oral)    Resp 16    SpO2 99%   Physical Exam Vitals signs and nursing note reviewed.  Constitutional:      Appearance: He is well-developed.  HENT:     Head: Normocephalic and atraumatic.     Right Ear: External ear normal.     Left Ear: External ear normal.     Nose: Nose normal.  Eyes:     General:        Right eye: No discharge.        Left eye: No discharge.     Extraocular Movements: Extraocular movements intact.     Pupils: Pupils are equal, round, and reactive to light.  Neck:     Musculoskeletal: Neck supple.  Cardiovascular:     Rate and Rhythm: Normal rate and regular rhythm.     Heart sounds: Normal heart sounds.  Pulmonary:     Effort: Pulmonary effort is normal.     Breath sounds: Normal breath sounds.  Abdominal:     Palpations: Abdomen is soft.     Tenderness: There is no abdominal tenderness.  Skin:    General: Skin is warm and dry.  Neurological:     Mental Status: He is alert. He is disoriented.     Comments: CN 3-12 grossly intact. 5/5 strength in all 4 extremities. Grossly normal sensation. Normal finger to nose.  Awake, alert, oriented to person, place and year. Disoriented to day of week/month.  Psychiatric:        Mood and Affect: Mood is not anxious.      ED Treatments / Results  Labs (all labs ordered are listed, but only abnormal results are displayed) Labs Reviewed  CBC - Abnormal; Notable for the following components:      Result Value   WBC 23.1 (*)    All other components within normal limits  DIFFERENTIAL - Abnormal; Notable for the following components:   Neutro Abs 19.0 (*)    Monocytes Absolute 1.1 (*)    Abs Immature Granulocytes 0.24 (*)    All other components within normal limits  COMPREHENSIVE METABOLIC PANEL - Abnormal; Notable for the following components:    Sodium 134 (*)    CO2 15 (*)    Glucose, Bld 224 (*)    Creatinine, Ser 1.39 (*)    ALT 46 (*)    GFR calc non Af Amer 55 (*)    Anion gap 20 (*)    All other components within normal limits  I-STAT CHEM 8, ED - Abnormal; Notable for the following components:   BUN 21 (*)    Glucose, Bld 224 (*)    TCO2 16 (*)  All other components within normal limits  SARS CORONAVIRUS 2 (TAT 6-24 HRS)  PROTIME-INR  APTT  LACTIC ACID, PLASMA  LACTIC ACID, PLASMA  ETHANOL  RAPID URINE DRUG SCREEN, HOSP PERFORMED  URINALYSIS, ROUTINE W REFLEX MICROSCOPIC  CBG MONITORING, ED    EKG EKG Interpretation  Date/Time:  Tuesday May 23 2019 14:47:11 EDT Ventricular Rate:  126 PR Interval:    QRS Duration: 103 QT Interval:  337 QTC Calculation: 488 R Axis:   30 Text Interpretation:  Sinus tachycardia Ventricular premature complex Borderline ST depression, anterolateral leads Borderline prolonged QT interval rate is faster compared to 2012 Confirmed by Sherwood Gambler [phone]) on 05/23/2019 2:50:45 PM   Radiology Ct Angio Head W Or Wo Contrast  Addendum Date: 05/23/2019   ADDENDUM REPORT: 05/23/2019 15:13 ADDENDUM: Study discussed by telephone with Dr. Rosalin Hawking on 05/23/2019 at 1509 hours. We agree that visually the proximal Left ICA stenosis appears high-grade, and therefore may be numerically underestimated at 60%. Electronically Signed   By: Genevie Ann M.D.   On: 05/23/2019 15:13   Result Date: 05/23/2019 CLINICAL DATA:  57 year old male code stroke presentation. EXAM: CT ANGIOGRAPHY HEAD AND NECK TECHNIQUE: Multidetector CT imaging of the head and neck was performed using the standard protocol during bolus administration of intravenous contrast. Multiplanar CT image reconstructions and MIPs were obtained to evaluate the vascular anatomy. Carotid stenosis measurements (when applicable) are obtained utilizing NASCET criteria, using the distal internal carotid diameter as the denominator. CONTRAST:   76mL OMNIPAQUE IOHEXOL 350 MG/ML SOLN COMPARISON:  Plain head CT 1430 hours today. FINDINGS: CTA NECK Skeleton: No acute osseous abnormality identified. Upper chest: Negative. Other neck: No acute findings. Aortic arch: Moderate for age Calcified aortic atherosclerosis. Bovine type arch configuration. Right carotid system: No brachiocephalic artery or right CCA origin stenosis despite plaque. Plaque in the right CCA proximal to the bifurcation including abundant soft plaque medially at the level of the hypopharynx on series 5, image 113, but no significant stenosis. Additional calcified plaque at the right ICA origin and bulb but no significant stenosis. Left carotid system: Bovine type left CCA origin with about 50% stenosis due to calcified plaque (series 5, image 154). Intermittent soft plaque in the left CCA without stenosis proximal to the bifurcation. At the bifurcation there is bulky calcified plaque affecting the posterior left ICA origin and bulb. Stenosis is numerically estimated at 60 % with respect to the distal vessel on series 9, image 137. Patent left ICA to the skull base. Vertebral arteries: No significant proximal right subclavian artery stenosis despite soft and calcified plaque. Similar plaque at the right vertebral artery origin and V1 segment with moderate stenosis (series 7, images 282 through 273). The right vertebral is dominant and patent to the skull base without additional stenosis, despite some V3 segment calcified plaque. No proximal left subclavian artery stenosis despite plaque. Diminutive left vertebral artery with a normal origin on series 7, image 282. The left vertebral remains diminutive but patent to the skull base. CTA HEAD Posterior circulation: Dominant right vertebral artery with mild V4 segment calcified plaque and no stenosis. The right vertebral supplies the basilar. Patent right PICA origin. Diminutive left vertebral artery which appears to functionally terminates in the  PICA. Patent but diminutive and mildly irregular basilar artery, no focal basilar stenosis. Patent SCA and left PCA origin. Fetal right PCA origin. Bilateral PCA branches are within normal limits. Anterior circulation: Both ICA siphons are patent. On the left there is moderate calcified plaque  with only mild stenosis. On the right there is mild to moderate calcified plaque and similar mild supraclinoid stenosis. Normal right ophthalmic and posterior communicating artery origins. Patent carotid termini, MCA and ACA origins. Anterior communicating artery and bilateral ACA branches are within normal limits. Left MCA M1 segment and early bifurcation are patent without stenosis. Left MCA branches appear patent with mild irregularity. Right MCA M1 and bifurcation are patent without stenosis. Right MCA branches are patent with similar mild irregularity. Venous sinuses: Early contrast phase, not well evaluated. Anatomic variants: Dominant right vertebral artery which supplies the basilar. Diminutive left vertebral functionally terminates in PICA. Fetal right PCA origin. Review of the MIP images confirms the above findings IMPRESSION: 1. Negative for large vessel occlusion. 2. Positive for abundant atherosclerotic plaque in the proximal great vessels, at the carotid bifurcations, and V1 segment of the dominant right vertebral artery. Proximal left ICA stenosis estimated at 60%. Moderate proximal right vertebral artery stenosis. 3. Intracranial atherosclerosis including bilateral calcified ICA siphons but no significant intracranial stenosis. 4. Aortic Atherosclerosis (ICD10-I70.0). These results were communicated to Dr. Erlinda Hong At 3:06 pm on 05/23/2019 by text page via the [facility] messaging system. Electronically Signed: By: Genevie Ann M.D. On: 05/23/2019 15:06   Ct Angio Neck W Or Wo Contrast  Addendum Date: 05/23/2019   ADDENDUM REPORT: 05/23/2019 15:13 ADDENDUM: Study discussed by telephone with Dr. Rosalin Hawking on 05/23/2019 at  1509 hours. We agree that visually the proximal Left ICA stenosis appears high-grade, and therefore may be numerically underestimated at 60%. Electronically Signed   By: Genevie Ann M.D.   On: 05/23/2019 15:13   Result Date: 05/23/2019 CLINICAL DATA:  58 year old male code stroke presentation. EXAM: CT ANGIOGRAPHY HEAD AND NECK TECHNIQUE: Multidetector CT imaging of the head and neck was performed using the standard protocol during bolus administration of intravenous contrast. Multiplanar CT image reconstructions and MIPs were obtained to evaluate the vascular anatomy. Carotid stenosis measurements (when applicable) are obtained utilizing NASCET criteria, using the distal internal carotid diameter as the denominator. CONTRAST:  71mL OMNIPAQUE IOHEXOL 350 MG/ML SOLN COMPARISON:  Plain head CT 1430 hours today. FINDINGS: CTA NECK Skeleton: No acute osseous abnormality identified. Upper chest: Negative. Other neck: No acute findings. Aortic arch: Moderate for age Calcified aortic atherosclerosis. Bovine type arch configuration. Right carotid system: No brachiocephalic artery or right CCA origin stenosis despite plaque. Plaque in the right CCA proximal to the bifurcation including abundant soft plaque medially at the level of the hypopharynx on series 5, image 113, but no significant stenosis. Additional calcified plaque at the right ICA origin and bulb but no significant stenosis. Left carotid system: Bovine type left CCA origin with about 50% stenosis due to calcified plaque (series 5, image 154). Intermittent soft plaque in the left CCA without stenosis proximal to the bifurcation. At the bifurcation there is bulky calcified plaque affecting the posterior left ICA origin and bulb. Stenosis is numerically estimated at 60 % with respect to the distal vessel on series 9, image 137. Patent left ICA to the skull base. Vertebral arteries: No significant proximal right subclavian artery stenosis despite soft and calcified  plaque. Similar plaque at the right vertebral artery origin and V1 segment with moderate stenosis (series 7, images 282 through 273). The right vertebral is dominant and patent to the skull base without additional stenosis, despite some V3 segment calcified plaque. No proximal left subclavian artery stenosis despite plaque. Diminutive left vertebral artery with a normal origin on series 7, image  282. The left vertebral remains diminutive but patent to the skull base. CTA HEAD Posterior circulation: Dominant right vertebral artery with mild V4 segment calcified plaque and no stenosis. The right vertebral supplies the basilar. Patent right PICA origin. Diminutive left vertebral artery which appears to functionally terminates in the PICA. Patent but diminutive and mildly irregular basilar artery, no focal basilar stenosis. Patent SCA and left PCA origin. Fetal right PCA origin. Bilateral PCA branches are within normal limits. Anterior circulation: Both ICA siphons are patent. On the left there is moderate calcified plaque with only mild stenosis. On the right there is mild to moderate calcified plaque and similar mild supraclinoid stenosis. Normal right ophthalmic and posterior communicating artery origins. Patent carotid termini, MCA and ACA origins. Anterior communicating artery and bilateral ACA branches are within normal limits. Left MCA M1 segment and early bifurcation are patent without stenosis. Left MCA branches appear patent with mild irregularity. Right MCA M1 and bifurcation are patent without stenosis. Right MCA branches are patent with similar mild irregularity. Venous sinuses: Early contrast phase, not well evaluated. Anatomic variants: Dominant right vertebral artery which supplies the basilar. Diminutive left vertebral functionally terminates in PICA. Fetal right PCA origin. Review of the MIP images confirms the above findings IMPRESSION: 1. Negative for large vessel occlusion. 2. Positive for abundant  atherosclerotic plaque in the proximal great vessels, at the carotid bifurcations, and V1 segment of the dominant right vertebral artery. Proximal left ICA stenosis estimated at 60%. Moderate proximal right vertebral artery stenosis. 3. Intracranial atherosclerosis including bilateral calcified ICA siphons but no significant intracranial stenosis. 4. Aortic Atherosclerosis (ICD10-I70.0). These results were communicated to Dr. Erlinda Hong At 3:06 pm on 05/23/2019 by text page via the [facility] messaging system. Electronically Signed: By: Genevie Ann M.D. On: 05/23/2019 15:06   Ct Head Code Stroke Wo Contrast  Result Date: 05/23/2019 CLINICAL DATA:  Code stroke. 59 year old male with headache and difficulty following commands. Last seen normal 1300 hours. EXAM: CT HEAD WITHOUT CONTRAST TECHNIQUE: Contiguous axial images were obtained from the base of the skull through the vertex without intravenous contrast. COMPARISON:  Brain MRI 07/09/2012 [facility], head CT 04/06/2011. FINDINGS: Brain: Chronic appearing encephalomalacia along the lateral right temporal lobe, at the confluence with the inferior right parietal and anterior occipital lobes. Evidence of laminar necrosis. Small chronic appearing infarct in the left cerebellum on series 3, image 11. Elsewhere normal gray-white matter differentiation. No midline shift, ventriculomegaly, mass effect, evidence of mass lesion, intracranial hemorrhage or evidence of cortically based acute infarction. Vascular: Calcified atherosclerosis at the skull base. No suspicious intracranial vascular hyperdensity. Skull: No acute osseous abnormality identified. Sinuses/Orbits: Chronic left posterior ethmoid mucosal thickening, otherwise well pneumatized paranasal sinuses and mastoids. Other: Visualized orbits and scalp soft tissues are within normal limits. ASPECTS [facility] Stroke Program Early CT Score) Total score (0-10 with 10 being normal): 10 (chronic encephalomalacia in  the right hemisphere). IMPRESSION: 1. No acute cortically based infarct or acute intracranial hemorrhage identified. 2. New since 2012 but Chronic appearing infarcts of the lateral right temporal lobe (moderate size) and the left cerebellum (small). 3.  ASPECTS 10. 4. These results were communicated to Dr. Erlinda Hong at 2:34 pm on 05/23/2019 by text page via the [facility] messaging system. Electronically Signed   By: Genevie Ann M.D.   On: 05/23/2019 14:35    Procedures Procedures (including critical care time)  Medications Ordered in ED Medications  sodium chloride flush (NS) 0.9 % injection 3 mL (has no  administration in time range)  ondansetron (ZOFRAN) injection 4 mg (has no administration in time range)  iohexol (OMNIPAQUE) 350 MG/ML injection 75 mL (75 mLs Intravenous Contrast Given 05/23/19 1439)  sodium chloride 0.9 % bolus 1,000 mL (1,000 mLs Intravenous New Bag/Given 05/23/19 1517)     Initial Impression / Assessment and Plan / ED Course  I have reviewed the triage vital signs and the nursing notes.  Pertinent labs & imaging results that were available during my care of the patient were reviewed by me and considered in my medical decision making (see chart for details).        Patient remains altered though he is improving.  He is still disoriented.  Otherwise, there is question of seizure-like activity on scene.  He does not meet any criteria for acute thrombolytics but will still need MRI and stroke work-up.  Neurology also will get EEG.  He does have an impressive white blood cell count though this could be from seizure/stress response.  This would also explain the anion gap acidosis.  There is no clear source of an infection which could also cause these numbers.  He is on Eliquis so he could not get an LP but at this time his exam is not really consistent with meningitis anyway.  Will need monitoring and further work-up.  Discussed with Dr. Lorin Mercy, who will admit.  KWESI SLICER was evaluated  in Emergency Department on 05/23/2019 for the symptoms described in the history of present illness. He was evaluated in the context of the global COVID-19 pandemic, which necessitated consideration that the patient might be at risk for infection with the SARS-CoV-2 virus that causes COVID-19. Institutional protocols and algorithms that pertain to the evaluation of patients at risk for COVID-19 are in a state of rapid change based on information released by regulatory bodies including the CDC and federal and state organizations. These policies and algorithms were followed during the patient's care in the ED.   Final Clinical Impressions(s) / ED Diagnoses   Final diagnoses:  Altered mental status, unspecified altered mental status type    ED Discharge Orders    None       Sherwood Gambler, MD 05/23/19 1529

## 2019-05-23 NOTE — ED Notes (Signed)
EEG at bedside.

## 2019-05-23 NOTE — H&P (Signed)
History and Physical    Timothy Garrison [id number] DOB: Oct 30, 1960 DOA: 05/23/2019  PCP: Einar Pheasant, MD  Patient coming from:  Home - lives with wife; NOK: Wife  Chief Complaint: Code stroke  HPI: Timothy Garrison is a 58 y.o. 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  He was working on a car and his "head got to wobbly, I was getting dizzy like I was getting faint."  He got into the house and started vomiting.  He reports possible mild confusion.  No N/W/T of arms/legs.  He did not lose consciousness.  He does not think he hit his head or bit his tongue.  He felt fine earlier today, "like normal."  Currently, he is less dizzy but still a little dizzy and nauseated and with chills.  No cough, fever, diarrhea.   ED Course:  Called as Code Stroke.  LKN 1PM and then headache, nausea, confusion.  EMS reported possible shaking but not classic for seizure.  CT/CTA with ?old stroke.  ?Seizure.   No fever.  On Eliquis, took it this AM.  Neuro recommends MRI/EEG and ongoing monitoring.    Review of Systems: As per HPI; otherwise review of systems reviewed and negative.   Ambulatory Status:  Ambulates without assistance  Past Medical History:  Diagnosis Date   Basal cell carcinoma (BCC) of back    Basal cell carcinoma (BCC) of nasal sidewall    Dizziness    GERD (gastroesophageal reflux disease)    H/O blood clots    Hypercholesterolemia    Hypertension    hx of HBP readings   Metastatic squamous cell carcinoma ([place]) 2018   bx cervical lymph node   Vascular abnormality    VTE (venous thromboembolism) 2018   associated with chemotherapy    Past Surgical History:  Procedure Laterality Date   COLONOSCOPY      ESOPHAGOGASTRODUODENOSCOPY (EGD) WITH PROPOFOL N/A 05/24/2015   Procedure: ESOPHAGOGASTRODUODENOSCOPY (EGD) WITH PROPOFOL;  Surgeon: Manya Silvas, MD;  Location: [facility] ENDOSCOPY;  Service: Endoscopy;  Laterality: N/A;   IR IMAGING GUIDED PORT INSERTION  12/09/2016   SAVORY DILATION N/A 05/24/2015   Procedure: SAVORY DILATION;  Surgeon: Manya Silvas, MD;  Location: [facility] ENDOSCOPY;  Service: Endoscopy;  Laterality: N/A;    Social History   Socioeconomic History   Marital status: Married    Spouse name: Not on file   Number of children: Not on file   Years of education: Not on file   Highest education level: Not on file  Occupational History   Occupation: Dealer  Social Needs   Financial resource strain: Not on file   Food insecurity    Worry: Not on file    Inability: Not on file   Transportation needs    Medical: Not on file    Non-medical: Not on file  Tobacco Use   Smoking status: Former Smoker    Quit date: 11/11/1996    Years since quitting: 22.5   Smokeless tobacco: Never Used  Substance and Sexual Activity   Alcohol use: No    Alcohol/week: 0.0 standard drinks    Comment: occasional   Drug use: No   Sexual activity: Yes  Lifestyle   Physical activity    Days per week: Not on  file    Minutes per session: Not on file   Stress: Not on file  Relationships   Social connections    Talks on phone: Not on file    Gets together: Not on file    Attends religious service: Not on file    Active member of club or organization: Not on file    Attends meetings of clubs or organizations: Not on file    Relationship status: Not on file   Intimate partner violence    Fear of current or ex partner: Not on file    Emotionally abused: Not on file    Physically abused: Not on file    Forced sexual activity: Not on file  Other Topics Concern   Not on file  Social History Narrative   Not on file    No Known Allergies  Family History  Problem  Relation Age of Onset   Hyperlipidemia Mother    Heart disease Mother    Hypertension Mother    Alzheimer's disease Mother    Prostate cancer Father    Stroke Father     Prior to Admission medications   Medication Sig Start Date End Date Taking? Authorizing Provider  apixaban (ELIQUIS) 5 MG TABS tablet Take 1 tablet (5 mg total) by mouth every 12 (twelve) hours. 03/16/19  Yes Einar Pheasant, MD  lisinopril (ZESTRIL) 20 MG tablet TAKE 1 TABLET BY MOUTH EVERY DAY 01/10/19  Yes Einar Pheasant, MD  Multiple Vitamin (MULTIVITAMIN) capsule Take by mouth. 12/04/10  Yes [provider]  omeprazole (PRILOSEC) 20 MG capsule TAKE 1 CAPSULE (20 MG TOTAL) BY MOUTH 2 (TWO) TIMES DAILY. Patient taking differently: Take 20 mg by mouth daily.  04/07/19  Yes Einar Pheasant, MD  pravastatin (PRAVACHOL) 20 MG tablet Take 1 tablet (20 mg total) by mouth daily. 03/16/19  Yes Einar Pheasant, MD    Physical Exam: Vitals:   05/23/19 1615 05/23/19 1617 05/23/19 1630 05/23/19 1651  BP: (!) 155/79  126/67   Pulse: (!) 107  (!) 106   Resp: 15  14   Temp:    98.3 F (36.8 C)  TempSrc:    Oral  SpO2: 97%  97%   Weight:  72.9 kg    Height:  5\' 8"  (1.727 m)        General:  Appears calm and comfortable and is NAD, somewhat warm to touch, minimally confused  Eyes:  PERRL, EOMI, normal lids, iris  ENT:  grossly normal hearing, lips & tongue, mmm  Neck:  no LAD, masses or thyromegaly  Cardiovascular:  RR with mild tachycardia, no m/r/g. No LE edema.   Respiratory:   CTA bilaterally with no wheezes/rales/rhonchi.  Normal respiratory effort.  Abdomen:  soft, NT, ND, NABS  Back:   normal alignment, no CVAT  Skin:  no rash or induration seen on limited exam  Musculoskeletal:  grossly normal tone BUE/BLE, good ROM, no bony abnormality  Lower extremity:  No LE edema.  Limited foot exam with no ulcerations.  2+ distal pulses.  Psychiatric:  grossly normal mood and affect, speech fluent  and appropriate, AOx3  Neurologic:  CN 2-12 grossly intact, moves all extremities in coordinated fashion, sensation intact    Radiological Exams on Admission: Ct Angio Head W Or Wo Contrast  Addendum Date: 05/23/2019   ADDENDUM REPORT: 05/23/2019 15:13 ADDENDUM: Study discussed by telephone with Dr. Rosalin Hawking on 05/23/2019 at 1509 hours. We agree that visually the proximal Left ICA stenosis appears high-grade,  and therefore may be numerically underestimated at 60%. Electronically Signed   By: Genevie Ann M.D.   On: 05/23/2019 15:13   Result Date: 05/23/2019 CLINICAL DATA:  58 year old male code stroke presentation. EXAM: CT ANGIOGRAPHY HEAD AND NECK TECHNIQUE: Multidetector CT imaging of the head and neck was performed using the standard protocol during bolus administration of intravenous contrast. Multiplanar CT image reconstructions and MIPs were obtained to evaluate the vascular anatomy. Carotid stenosis measurements (when applicable) are obtained utilizing NASCET criteria, using the distal internal carotid diameter as the denominator. CONTRAST:  40mL OMNIPAQUE IOHEXOL 350 MG/ML SOLN COMPARISON:  Plain head CT 1430 hours today. FINDINGS: CTA NECK Skeleton: No acute osseous abnormality identified. Upper chest: Negative. Other neck: No acute findings. Aortic arch: Moderate for age Calcified aortic atherosclerosis. Bovine type arch configuration. Right carotid system: No brachiocephalic artery or right CCA origin stenosis despite plaque. Plaque in the right CCA proximal to the bifurcation including abundant soft plaque medially at the level of the hypopharynx on series 5, image 113, but no significant stenosis. Additional calcified plaque at the right ICA origin and bulb but no significant stenosis. Left carotid system: Bovine type left CCA origin with about 50% stenosis due to calcified plaque (series 5, image 154). Intermittent soft plaque in the left CCA without stenosis proximal to the bifurcation. At the  bifurcation there is bulky calcified plaque affecting the posterior left ICA origin and bulb. Stenosis is numerically estimated at 60 % with respect to the distal vessel on series 9, image 137. Patent left ICA to the skull base. Vertebral arteries: No significant proximal right subclavian artery stenosis despite soft and calcified plaque. Similar plaque at the right vertebral artery origin and V1 segment with moderate stenosis (series 7, images 282 through 273). The right vertebral is dominant and patent to the skull base without additional stenosis, despite some V3 segment calcified plaque. No proximal left subclavian artery stenosis despite plaque. Diminutive left vertebral artery with a normal origin on series 7, image 282. The left vertebral remains diminutive but patent to the skull base. CTA HEAD Posterior circulation: Dominant right vertebral artery with mild V4 segment calcified plaque and no stenosis. The right vertebral supplies the basilar. Patent right PICA origin. Diminutive left vertebral artery which appears to functionally terminates in the PICA. Patent but diminutive and mildly irregular basilar artery, no focal basilar stenosis. Patent SCA and left PCA origin. Fetal right PCA origin. Bilateral PCA branches are within normal limits. Anterior circulation: Both ICA siphons are patent. On the left there is moderate calcified plaque with only mild stenosis. On the right there is mild to moderate calcified plaque and similar mild supraclinoid stenosis. Normal right ophthalmic and posterior communicating artery origins. Patent carotid termini, MCA and ACA origins. Anterior communicating artery and bilateral ACA branches are within normal limits. Left MCA M1 segment and early bifurcation are patent without stenosis. Left MCA branches appear patent with mild irregularity. Right MCA M1 and bifurcation are patent without stenosis. Right MCA branches are patent with similar mild irregularity. Venous sinuses:  Early contrast phase, not well evaluated. Anatomic variants: Dominant right vertebral artery which supplies the basilar. Diminutive left vertebral functionally terminates in PICA. Fetal right PCA origin. Review of the MIP images confirms the above findings IMPRESSION: 1. Negative for large vessel occlusion. 2. Positive for abundant atherosclerotic plaque in the proximal great vessels, at the carotid bifurcations, and V1 segment of the dominant right vertebral artery. Proximal left ICA stenosis estimated at 60%. Moderate proximal  right vertebral artery stenosis. 3. Intracranial atherosclerosis including bilateral calcified ICA siphons but no significant intracranial stenosis. 4. Aortic Atherosclerosis (ICD10-I70.0). These results were communicated to Dr. Erlinda Hong At 3:06 pm on 05/23/2019 by text page via the [facility] messaging system. Electronically Signed: By: Genevie Ann M.D. On: 05/23/2019 15:06   Ct Angio Neck W Or Wo Contrast  Addendum Date: 05/23/2019   ADDENDUM REPORT: 05/23/2019 15:13 ADDENDUM: Study discussed by telephone with Dr. Rosalin Hawking on 05/23/2019 at 1509 hours. We agree that visually the proximal Left ICA stenosis appears high-grade, and therefore may be numerically underestimated at 60%. Electronically Signed   By: Genevie Ann M.D.   On: 05/23/2019 15:13   Result Date: 05/23/2019 CLINICAL DATA:  58 year old male code stroke presentation. EXAM: CT ANGIOGRAPHY HEAD AND NECK TECHNIQUE: Multidetector CT imaging of the head and neck was performed using the standard protocol during bolus administration of intravenous contrast. Multiplanar CT image reconstructions and MIPs were obtained to evaluate the vascular anatomy. Carotid stenosis measurements (when applicable) are obtained utilizing NASCET criteria, using the distal internal carotid diameter as the denominator. CONTRAST:  97mL OMNIPAQUE IOHEXOL 350 MG/ML SOLN COMPARISON:  Plain head CT 1430 hours today. FINDINGS: CTA NECK Skeleton: No acute osseous abnormality  identified. Upper chest: Negative. Other neck: No acute findings. Aortic arch: Moderate for age Calcified aortic atherosclerosis. Bovine type arch configuration. Right carotid system: No brachiocephalic artery or right CCA origin stenosis despite plaque. Plaque in the right CCA proximal to the bifurcation including abundant soft plaque medially at the level of the hypopharynx on series 5, image 113, but no significant stenosis. Additional calcified plaque at the right ICA origin and bulb but no significant stenosis. Left carotid system: Bovine type left CCA origin with about 50% stenosis due to calcified plaque (series 5, image 154). Intermittent soft plaque in the left CCA without stenosis proximal to the bifurcation. At the bifurcation there is bulky calcified plaque affecting the posterior left ICA origin and bulb. Stenosis is numerically estimated at 60 % with respect to the distal vessel on series 9, image 137. Patent left ICA to the skull base. Vertebral arteries: No significant proximal right subclavian artery stenosis despite soft and calcified plaque. Similar plaque at the right vertebral artery origin and V1 segment with moderate stenosis (series 7, images 282 through 273). The right vertebral is dominant and patent to the skull base without additional stenosis, despite some V3 segment calcified plaque. No proximal left subclavian artery stenosis despite plaque. Diminutive left vertebral artery with a normal origin on series 7, image 282. The left vertebral remains diminutive but patent to the skull base. CTA HEAD Posterior circulation: Dominant right vertebral artery with mild V4 segment calcified plaque and no stenosis. The right vertebral supplies the basilar. Patent right PICA origin. Diminutive left vertebral artery which appears to functionally terminates in the PICA. Patent but diminutive and mildly irregular basilar artery, no focal basilar stenosis. Patent SCA and left PCA origin. Fetal right PCA  origin. Bilateral PCA branches are within normal limits. Anterior circulation: Both ICA siphons are patent. On the left there is moderate calcified plaque with only mild stenosis. On the right there is mild to moderate calcified plaque and similar mild supraclinoid stenosis. Normal right ophthalmic and posterior communicating artery origins. Patent carotid termini, MCA and ACA origins. Anterior communicating artery and bilateral ACA branches are within normal limits. Left MCA M1 segment and early bifurcation are patent without stenosis. Left MCA branches appear patent with mild irregularity. Right  MCA M1 and bifurcation are patent without stenosis. Right MCA branches are patent with similar mild irregularity. Venous sinuses: Early contrast phase, not well evaluated. Anatomic variants: Dominant right vertebral artery which supplies the basilar. Diminutive left vertebral functionally terminates in PICA. Fetal right PCA origin. Review of the MIP images confirms the above findings IMPRESSION: 1. Negative for large vessel occlusion. 2. Positive for abundant atherosclerotic plaque in the proximal great vessels, at the carotid bifurcations, and V1 segment of the dominant right vertebral artery. Proximal left ICA stenosis estimated at 60%. Moderate proximal right vertebral artery stenosis. 3. Intracranial atherosclerosis including bilateral calcified ICA siphons but no significant intracranial stenosis. 4. Aortic Atherosclerosis (ICD10-I70.0). These results were communicated to Dr. Erlinda Hong At 3:06 pm on 05/23/2019 by text page via the [facility] messaging system. Electronically Signed: By: Genevie Ann M.D. On: 05/23/2019 15:06   Dg Chest Portable 1 View  Result Date: 05/23/2019 CLINICAL DATA:  Altered mental status. EXAM: PORTABLE CHEST 1 VIEW COMPARISON:  02/25/2006 FINDINGS: Cardiac silhouette is normal in size. No mediastinal or hilar masses. No evidence of adenopathy. Clear lungs.  No pleural effusion or pneumothorax. Skeletal  structures are grossly intact. IMPRESSION: No active disease. Electronically Signed   By: Lajean Manes M.D.   On: 05/23/2019 15:27   Ct Head Code Stroke Wo Contrast  Result Date: 05/23/2019 CLINICAL DATA:  Code stroke. 58 year old male with headache and difficulty following commands. Last seen normal 1300 hours. EXAM: CT HEAD WITHOUT CONTRAST TECHNIQUE: Contiguous axial images were obtained from the base of the skull through the vertex without intravenous contrast. COMPARISON:  Brain MRI 07/09/2012 [facility], head CT 04/06/2011. FINDINGS: Brain: Chronic appearing encephalomalacia along the lateral right temporal lobe, at the confluence with the inferior right parietal and anterior occipital lobes. Evidence of laminar necrosis. Small chronic appearing infarct in the left cerebellum on series 3, image 11. Elsewhere normal gray-white matter differentiation. No midline shift, ventriculomegaly, mass effect, evidence of mass lesion, intracranial hemorrhage or evidence of cortically based acute infarction. Vascular: Calcified atherosclerosis at the skull base. No suspicious intracranial vascular hyperdensity. Skull: No acute osseous abnormality identified. Sinuses/Orbits: Chronic left posterior ethmoid mucosal thickening, otherwise well pneumatized paranasal sinuses and mastoids. Other: Visualized orbits and scalp soft tissues are within normal limits. ASPECTS [facility] Stroke Program Early CT Score) Total score (0-10 with 10 being normal): 10 (chronic encephalomalacia in the right hemisphere). IMPRESSION: 1. No acute cortically based infarct or acute intracranial hemorrhage identified. 2. New since 2012 but Chronic appearing infarcts of the lateral right temporal lobe (moderate size) and the left cerebellum (small). 3.  ASPECTS 10. 4. These results were communicated to Dr. Erlinda Hong at 2:34 pm on 05/23/2019 by text page via the [facility] messaging system. Electronically Signed   By: Genevie Ann M.D.   On:  05/23/2019 14:35    EKG: Independently reviewed.  Sinus tachycardia with rate 126; nonspecific ST changes with no evidence of acute ischemia   Labs on Admission: I have personally reviewed the available labs and imaging studies at the time of the admission.  Pertinent labs:   CO2 15 Glucose 224 BUN 20/Creatinine 1.39/GFR 55 Anion gap 20 WBC 23.1 INR 1.1 Normal TSH on 02/16/19 Lipids on 02/16/19: 266/38.50/192/178 Lactate 5.0 A1c 6.0 ETOH <10   Assessment/Plan Principal Problem:   Sepsis due to undetermined organism with metabolic encephalopathy (HCC) Active Problems:   Essential hypertension, benign   History of malignant neoplasm of oropharynx   Left shoulder pain   Sepsis -  Patient presenting with AMS -Initial concern for CVA, although history less consistent with this -Code stroke called, MRI pending, CTA appears to show proximal high-grade LCA stenosis (previously <50% stenosis in 2019 at [facility]) -EEG also ordered by neurology -However, patient also with SIRS criteria in this patient including: Leukocytosis,  tachycardia  -Patient has evidence of acute organ failure with elevated lactate to 5 -While awaiting blood cultures, this appears to be a preseptic condition. -Sepsis protocol initiated -Patient had initial lactate >4 and so has received the 30 cc/kg IVF bolus. -Suspected source is unknown - UA is pending, blood cultures pending, CXR unremarkable -Blood and urine cultures pending -Will place in observation status with telemetry and continue to monitor -Treat with IV Cefepime/Vanc/Flagyl for undifferentiated sepsis -Will add HIV -Will trend lactate to ensure improvement -Will order sepsis protocol procalcitonin level.  Antibiotics would not be indicated for PCT <0.1 and probably should not be used for < 0.25.  >0.5 indicates infection and >>0.5 indicates more serious disease.  As the procalcitonin level normalizes, it will be reasonable to consider de-escalation of  antibiotic coverage. -Could consider LP, but patient is on Eliquis and AMS has essentially resolved (would be unusual presentation of meningitis) - suspect encephalopathy due to fever/infection.  HTN -Continue Lisinopril  HLD -Stop Pravachol and start Lipitor 40 mg daily - last LDL was 192!  Hyperglycemia -Suspect DM, although this could be an acute phase reaction -Will check A1c and start moderate-scale SSI  H/o Oropharyngeal CA -His daughter reports that he had a bad reaction to cisplatin, although review of the records indicates significant h/o PAD -s/p chemo and then XRT    Note: This patient has been tested and is negative for the novel coronavirus COVID-19.   DVT prophylaxis:  Eliquis Code Status:  Full - confirmed with patient Family Communication: None present; I spoke with the wife by telephone Disposition Plan:  Home once clinically improved Consults called: Neurology Admission status: It is my clinical opinion that referral for OBSERVATION is reasonable and necessary in this patient based on the above information provided. The aforementioned taken together are felt to place the patient at high risk for further clinical deterioration. However it is anticipated that the patient may be medically stable for discharge from the hospital within 24 to 48 hours.    Karmen Bongo MD [facility]   How to contact the [facility] Attending or Consulting provider [place] or covering provider during after hours [place], for this patient?  1. Check the care team in [facility] and look for a) attending/consulting [facility] provider listed and b) the [facility] team listed 2. Log into [email or web address] and use [place]'s universal password to access. If you do not have the password, please contact the hospital operator. 3. Locate the [facility] provider you are looking for under [facility] and page to a number that you can be directly reached. 4. If you still have difficulty reaching the provider, please  page the [facility] (Director on Call) for the Hospitalists listed on amion for assistance.   05/23/2019, 5:41 PM

## 2019-05-23 NOTE — ED Notes (Signed)
Timothy Garrison daughter  [phone]

## 2019-05-23 NOTE — ED Notes (Signed)
Pt transported to MRI 

## 2019-05-23 NOTE — ED Notes (Signed)
EEG remains at bedside.

## 2019-05-23 NOTE — ED Triage Notes (Signed)
PER EMS: pt arrives as a Code Stroke, LNW 1300. Pt was at home working on car in his garage when he called friend for help with c/o "feeling sick," frontal HA, weakness, confusion, lethargy, slurred speech and inability to follow commands. Upon arrival to ED, pt is still confused but able to follow commands. Slurred speech has resolved but still reports nausea and headache. Pt takes Eliquis, last dose was this morning, per patient.  CBG-155, BP-180/100, HR-140

## 2019-05-23 NOTE — ED Notes (Signed)
sepsis orders were not completed prior to this RN assuming care of patient at 1900

## 2019-05-24 ENCOUNTER — Observation Stay (HOSPITAL_BASED_OUTPATIENT_CLINIC_OR_DEPARTMENT_OTHER): Payer: Medicare Other

## 2019-05-24 DIAGNOSIS — R4182 Altered mental status, unspecified: Secondary | ICD-10-CM

## 2019-05-24 DIAGNOSIS — R569 Unspecified convulsions: Secondary | ICD-10-CM | POA: Diagnosis not present

## 2019-05-24 DIAGNOSIS — I340 Nonrheumatic mitral (valve) insufficiency: Secondary | ICD-10-CM | POA: Diagnosis not present

## 2019-05-24 LAB — CBC
HCT: 34.6 % — ABNORMAL LOW (ref 39.0–52.0)
Hemoglobin: 11.2 g/dL — ABNORMAL LOW (ref 13.0–17.0)
MCH: 27.6 pg (ref 26.0–34.0)
MCHC: 32.4 g/dL (ref 30.0–36.0)
MCV: 85.2 fL (ref 80.0–100.0)
Platelets: 240 10*3/uL (ref 150–400)
RBC: 4.06 MIL/uL — ABNORMAL LOW (ref 4.22–5.81)
RDW: 14.2 % (ref 11.5–15.5)
WBC: 9.7 10*3/uL (ref 4.0–10.5)
nRBC: 0 % (ref 0.0–0.2)

## 2019-05-24 LAB — ECHOCARDIOGRAM COMPLETE
Height: 68 in
Weight: 2571.45 oz

## 2019-05-24 LAB — GLUCOSE, CAPILLARY
Glucose-Capillary: 95 mg/dL (ref 70–99)
Glucose-Capillary: 124 mg/dL — ABNORMAL HIGH (ref 70–99)

## 2019-05-24 LAB — LIPID PANEL
Cholesterol: 212 mg/dL — ABNORMAL HIGH (ref 0–200)
HDL: 32 mg/dL — ABNORMAL LOW (ref >40)
LDL Cholesterol: 144 mg/dL — ABNORMAL HIGH (ref 0–99)
Total CHOL/HDL Ratio: 6.6 RATIO
Triglycerides: 181 mg/dL — ABNORMAL HIGH (ref <150)
VLDL: 36 mg/dL (ref 0–40)

## 2019-05-24 LAB — BASIC METABOLIC PANEL
Anion gap: 7 (ref 5–15)
BUN: 10 mg/dL (ref 6–20)
CO2: 22 mmol/L (ref 22–32)
Calcium: 8.5 mg/dL — ABNORMAL LOW (ref 8.9–10.3)
Chloride: 109 mmol/L (ref 98–111)
Creatinine, Ser: 1.19 mg/dL (ref 0.61–1.24)
GFR calc Af Amer: >60 mL/min (ref >60)
GFR calc non Af Amer: >60 mL/min (ref >60)
Glucose, Bld: 112 mg/dL — ABNORMAL HIGH (ref 70–99)
Potassium: 3.7 mmol/L (ref 3.5–5.1)
Sodium: 138 mmol/L (ref 135–145)

## 2019-05-24 MED ORDER — LEVETIRACETAM 500 MG PO TABS: 500.0000 mg | ORAL_TABLET | Freq: Two times a day (BID) | ORAL | 1 refills | Status: DC

## 2019-05-24 MED ORDER — LEVETIRACETAM IN NACL 1000 MG/100ML IV SOLN
1000.0000 mg | Freq: Once | INTRAVENOUS | Status: AC
  Administered 2019-05-24: 14:00:00 1000 mg via INTRAVENOUS
  Filled 2019-05-24: qty 100

## 2019-05-24 MED ORDER — ADULT MULTIVITAMIN W/MINERALS CH
1.0000 | ORAL_TABLET | Freq: Every day | ORAL | Status: DC
  Administered 2019-05-24: 1 via ORAL
  Filled 2019-05-24: qty 1

## 2019-05-24 MED ORDER — PRAVASTATIN SODIUM 40 MG PO TABS: 40.0000 mg | ORAL_TABLET | Freq: Every day | ORAL | 0 refills | Status: DC

## 2019-05-24 MED ORDER — LEVETIRACETAM 500 MG PO TABS: 500.0000 mg | ORAL_TABLET | Freq: Two times a day (BID) | ORAL | Status: DC

## 2019-05-24 NOTE — Procedures (Signed)
Patient Name: Timothy Garrison  MRN: [id number]  Epilepsy Attending: Lora Havens  Referring Physician/Provider: Dr. Karmen Bongo Date: 05/23/2019 Duration: 23.34 minutes  Patient history: 58 year old male with sudden onset of imbalance, slurred speech headache and not able to follow commands.  EEG to evaluate for seizures.  Level of alertness: Awake, asleep  AEDs during EEG study: None  Technical aspects: This EEG study was done with scalp electrodes positioned according to the 10-20 International system of electrode placement. Electrical activity was acquired at a sampling rate of 500Hz  and reviewed with a high frequency filter of 70Hz  and a low frequency filter of 1Hz . EEG data were recorded continuously and digitally stored.   Description: During awake state, the posterior dominant rhythm consists of 8-9 Hz activity of moderate voltage (25-35 uV) seen predominantly in posterior head regions, symmetric and reactive to eye opening and eye closing.  During sleep vertex waves, sleep spindles (12 to 14 Hz), maximal frontocentral were seen.  There was continuous low voltage 2 to 5 Hz theta-delta slowing in the right frontotemporal region.  Hyperventilation and photic stimulation were not performed.     Abnormality -Continuous low, right frontotemporal region  IMPRESSION: This study is suggestive of cortical dysfunction in the right frontotemporal region, likely secondary to underlying encephalomalacia. No seizures or epileptiform discharges were seen throughout the recording.

## 2019-05-24 NOTE — Evaluation (Signed)
Occupational Therapy Evaluation Patient Details Name: Timothy Garrison MRN: [id number] DOB: 1961-05-30 Today's Date: 05/24/2019    History of Present Illness 58 y.o. male admitted on 05/23/19 for nausea, vomiting, dizziness, possible seizure.  Scans are negative for acute stroke,  but is consistent with acute R temporal encephalomalacia.  He is being treated for seizure.  Pt with significant PMH of HTN, dizziness.     Clinical Impression   Pt is at independent baseline level of function with ADLs/selfcare and mobility. Pt with minimal cognitive impairments with memory. Pt with coordination and vision WNL. All education completed and no further acute OT is indicated at this time    Follow Up Recommendations  No OT follow up    Equipment Recommendations  None recommended by OT    Recommendations for Other Services       Precautions / Restrictions Precautions Precautions: None Restrictions Weight Bearing Restrictions: No      Mobility Bed Mobility Overal bed mobility: Independent                Transfers Overall transfer level: Independent                    Balance Overall balance assessment: Independent                       Rhomberg - Eyes Opened: 60 Rhomberg - Eyes Closed: 60(very minimal increase in sway) High level balance activites: Turns;Other (comment) High Level Balance Comments: Picking up object from floor all independent           ADL either performed or assessed with clinical judgement   ADL Overall ADL's : Independent;At baseline                                             Vision Baseline Vision/History: No visual deficits Patient Visual Report: No change from baseline       Perception     Praxis      Pertinent Vitals/Pain Pain Assessment: No/denies pain     Hand Dominance Right   Extremity/Trunk Assessment Upper Extremity Assessment Upper Extremity Assessment: Overall WFL for tasks assessed   Lower Extremity Assessment Lower Extremity Assessment: Defer to PT evaluation   Cervical / Trunk Assessment Cervical / Trunk Assessment: Normal   Communication Communication Communication: No difficulties   Cognition Arousal/Alertness: Awake/alert Behavior During Therapy: WFL for tasks assessed/performed Overall Cognitive Status: Impaired/Different from baseline Area of Impairment: Problem solving                             Problem Solving: Slow processing General Comments: He just seems a shade slow to process, he reports not remebering the events just prior to his admission (ie the "fire department coming")   General Comments  Wears glasses to see, no reports of blurry or double vision, peripheral vision intact, one saccadic jump with tracking, but otherwise WNL.      Exercises     Shoulder Instructions      Home Living Family/patient expects to be discharged to:: Private residence Living Arrangements: Spouse/significant other Available Help at Discharge: Family Type of Home: House       Home Layout: One level     Bathroom Shower/Tub: Tub/shower unit;Walk-in shower   Bathroom Toilet: Standard  Home Equipment: None          Prior Functioning/Environment Level of Independence: Independent                 OT Problem List: Decreased activity tolerance;Decreased cognition      OT Treatment/Interventions:      OT Goals(Current goals can be found in the care plan section) Acute Rehab OT Goals Patient Stated Goal: go home today OT Goal Formulation: With patient/family  OT Frequency:     Barriers to D/C:    no barriers       Co-evaluation              AM-PAC OT "6 Clicks" Daily Activity     Outcome Measure Help from another person eating meals?: None Help from another person taking care of personal grooming?: None Help from another person toileting, which includes using toliet, bedpan, or urinal?: None Help from another  person bathing (including washing, rinsing, drying)?: None Help from another person to put on and taking off regular upper body clothing?: None Help from another person to put on and taking off regular lower body clothing?: None 6 Click Score: 24   End of Session    Activity Tolerance: Patient tolerated treatment well Patient left: in bed(sitting EOB)  OT Visit Diagnosis: Other symptoms and signs involving the nervous system [id number])                Time: [id number] OT Time Calculation (min): 13 min Charges:  OT General Charges $OT Visit: 1 Visit OT Evaluation $OT Eval Low Complexity: 1 Low    Britt Bottom 05/24/2019, 3:51 PM

## 2019-05-24 NOTE — Progress Notes (Signed)
[place] to be D/C'd home per MD order. Discussed with the patient and all questions fully answered. Stroke education not needed due to stroke being ruled out. VVS, Skin clean, dry and intact without evidence of skin break down, no evidence of skin tears noted.  IV catheters discontinued intact. Sites without signs and symptoms of complications. Dressing and pressure applied.  An After Visit Summary was printed and given to the patient.  Patient escorted via [place], and D/C home via private auto.  Melonie Florida  05/24/2019 4:19 PM

## 2019-05-24 NOTE — Progress Notes (Signed)
Carotid artery duplex completed. Refer to "CV Proc" under chart review to view preliminary results.  05/24/2019 10:13 AM Maudry Mayhew, MHA, RVT, RDCS, RDMS

## 2019-05-24 NOTE — Evaluation (Signed)
Physical Therapy Evaluation/Discharge Patient Details Name: Timothy Garrison MRN: [id number] DOB: [birth year] Today's Date: 05/24/2019   History of Present Illness  58 y.o. male admitted on 05/23/19 for nausea, vomiting, dizziness, possible seizure.  Scans are negative for acute stroke,  but is consistent with acute R temporal encephalomalacia.  He is being treated for seizure.  Pt with significant PMH of HTN, dizziness.    Clinical Impression  Bil LE strength equal, balance normal, gait independent.  Pt seemed just a bit slow with his processing, but otherwise alert and oriented.  Wife reports he is "much better" than yesterday, but not back to normal yet.  PT to sign off as his mobility looks good.  No PT follow up needed at this time.      Follow Up Recommendations No PT follow up    Equipment Recommendations  None recommended by PT    Recommendations for Other Services   NA    Precautions / Restrictions Precautions Precautions: None      Mobility  Bed Mobility Overal bed mobility: Independent                Transfers Overall transfer level: Independent                  Ambulation/Gait Ambulation/Gait assistance: Independent Gait Distance (Feet): 40 Feet Assistive device: None Gait Pattern/deviations: WFL(Within Functional Limits)     General Gait Details: Gait around the room as pt was in his undergarments getting ready to tak a shower.          Balance Overall balance assessment: Independent                       Rhomberg - Eyes Opened: 60 Rhomberg - Eyes Closed: 60(very minimal increase in sway) High level balance activites: Turns;Other (comment) High Level Balance Comments: Picking up object from floor all independent             Pertinent Vitals/Pain Pain Assessment: No/denies pain    Home Living Family/patient expects to be discharged to:: Private residence Living Arrangements: Spouse/significant other Available Help at  Discharge: Family                  Prior Function Level of Independence: Independent               Hand Dominance   Dominant Hand: Right    Extremity/Trunk Assessment   Upper Extremity Assessment Upper Extremity Assessment: Defer to OT evaluation    Lower Extremity Assessment Lower Extremity Assessment: Overall WFL for tasks assessed(strength, sensation, coordination WNL)    Cervical / Trunk Assessment Cervical / Trunk Assessment: Normal  Communication   Communication: No difficulties  Cognition Arousal/Alertness: Awake/alert Behavior During Therapy: WFL for tasks assessed/performed Overall Cognitive Status: Impaired/Different from baseline Area of Impairment: Problem solving                             Problem Solving: Slow processing General Comments: He just seems a shade slow to process, he reports not remebering the events just prior to his admission (ie the "fire department coming")      General Comments General comments (skin integrity, edema, etc.): Wears glasses to see, no reports of blurry or double vision, peripheral vision intact, one saccadic jump with tracking, but otherwise WNL.          Assessment/Plan    PT Assessment Patent does not need any  further PT services         PT Goals (Current goals can be found in the Care Plan section)  Acute Rehab PT Goals PT Goal Formulation: All assessment and education complete, DC therapy     AM-PAC PT "6 Clicks" Mobility  Outcome Measure Help needed turning from your back to your side while in a flat bed without using bedrails?: None Help needed moving from lying on your back to sitting on the side of a flat bed without using bedrails?: None Help needed moving to and from a bed to a chair (including a wheelchair)?: None Help needed standing up from a chair using your arms (e.g., wheelchair or bedside chair)?: None Help needed to walk in hospital room?: None Help needed climbing 3-5  steps with a railing? : None 6 Click Score: 24    End of Session   Activity Tolerance: Patient tolerated treatment well Patient left: Other (comment)(with wife assisting him in the shower. )   PT Visit Diagnosis: Other symptoms and signs involving the nervous system [id number])    Time: [id number] PT Time Calculation (min) (ACUTE ONLY): 8 min   Charges:       Wells Guiles B. Particia Strahm, PT, DPT  Acute Rehabilitation [phone] pager [phone]) [phone] office  @ [facility]: [phone]   PT Evaluation $PT Eval Low Complexity: 1 Low         05/24/2019, 1:23 PM

## 2019-05-24 NOTE — Progress Notes (Addendum)
Neurology Progress Note   S:// Patient seen and examined.  No acute changes Back to baseline Denies any history of seizures Denies history of stroke.  Denies history of head injury. MRI imaging showed right temporal lobe encephalomalacia new from 2013 but he has never had any neurological symptoms.  O:// Current vital signs: BP 123/79 (BP Location: Left Arm)   Pulse 79   Temp 99.2 F (37.3 C) (Oral)   Resp 18   Ht 5\' 8"  (1.727 m)   Wt 72.9 kg   SpO2 96%   BMI 24.44 kg/m  Vital signs in last 24 hours: Temp:  [97.5 F (36.4 C)-99.2 F (37.3 C)] 99.2 F (37.3 C) (10/07 0750) Pulse Rate:  [79-124] 79 (10/07 0750) Resp:  [10-22] 18 (10/07 0750) BP: (110-157)/(60-81) 123/79 (10/07 0750) SpO2:  [94 %-100 %] 96 % (10/07 0750) Weight:  [72.9 kg] 72.9 kg (10/06 1617) Awake alert oriented x3 next speech is non-dysarthric Naming comprehension repetition intact Pupils equal round react light, extraocular movements intact, visual fields full, face symmetric, facial sensation intact, tongue and palate midline. Symmetric 5/5 in all 4 extremities. Sensation intact light touch all over No dysmetria on finger-nose-finger Gait testing deferred at this time  General exam: Well-built well-developed well-nourished in no acute distress HEENT: Normocephalic atraumatic CVs: Regular rate rhythm Respiratory: Breathing normally saturating well on room air   Medications  Current Facility-Administered Medications:  .   stroke: mapping our early stages of recovery book, , Does not apply, Once, Karmen Bongo, MD, Stopped at 05/23/19 2009 .  0.9 %  sodium chloride infusion, , Intravenous, Continuous, Karmen Bongo, MD .  acetaminophen (TYLENOL) tablet 650 mg, 650 mg, Oral, Q4H PRN **OR** acetaminophen (TYLENOL) solution 650 mg, 650 mg, Per Tube, Q4H PRN **OR** acetaminophen (TYLENOL) suppository 650 mg, 650 mg, Rectal, Q4H PRN, Karmen Bongo, MD .  apixaban Arne Cleveland) tablet 5 mg, 5 mg, Oral,  Q12H, Karmen Bongo, MD, 5 mg at 05/24/19 0811 .  aspirin suppository 300 mg, 300 mg, Rectal, Daily **OR** aspirin tablet 325 mg, 325 mg, Oral, Daily, Karmen Bongo, MD, 325 mg at 05/24/19 0811 .  atorvastatin (LIPITOR) tablet 40 mg, 40 mg, Oral, q1800, Karmen Bongo, MD, Stopped at 05/23/19 2139 .  ceFEPIme (MAXIPIME) 2 g in sodium chloride 0.9 % 100 mL IVPB, 2 g, Intravenous, Q8H, Naik, Kushal D, RPH, Last Rate: 200 mL/hr at 05/24/19 1104, 2 g at 05/24/19 1104 .  lisinopril (ZESTRIL) tablet 20 mg, 20 mg, Oral, Daily, Karmen Bongo, MD, 20 mg at 05/24/19 0810 .  metroNIDAZOLE (FLAGYL) IVPB 500 mg, 500 mg, Intravenous, Q8H, Karmen Bongo, MD, Last Rate: 100 mL/hr at 05/24/19 0819, 500 mg at 05/24/19 0819 .  multivitamin with minerals tablet 1 tablet, 1 tablet, Oral, Daily, Vann, Jessica U, DO .  ondansetron [facility]) injection 4 mg, 4 mg, Intravenous, Q6H PRN, Karmen Bongo, MD, 4 mg at 05/23/19 1614 .  pantoprazole (PROTONIX) EC tablet 40 mg, 40 mg, Oral, Daily, Karmen Bongo, MD, 40 mg at 05/24/19 0810 .  senna-docusate (Senokot-S) tablet 1 tablet, 1 tablet, Oral, QHS PRN, Karmen Bongo, MD .  vancomycin (VANCOCIN) 1,500 mg in sodium chloride 0.9 % 500 mL IVPB, 1,500 mg, Intravenous, Q24H, Werner Lean, [facility] Labs CBC    Component Value Date/Time   WBC 23.1 (H) 05/23/2019 1418   RBC 4.73 05/23/2019 1418   HGB 13.9 05/23/2019 1428   HCT 41.0 05/23/2019 1428   PLT 271 05/23/2019 1418   MCV 85.0 05/23/2019 1418  MCH 28.3 05/23/2019 1418   MCHC 33.3 05/23/2019 1418   RDW 13.8 05/23/2019 1418   LYMPHSABS 2.6 05/23/2019 1418   MONOABS 1.1 (H) 05/23/2019 1418   EOSABS 0.1 05/23/2019 1418   BASOSABS 0.1 05/23/2019 1418    CMP     Component Value Date/Time   NA 135 05/23/2019 1428   K 3.5 05/23/2019 1428   CL 102 05/23/2019 1428   CO2 15 (L) 05/23/2019 1418   GLUCOSE 224 (H) 05/23/2019 1428   BUN 21 (H) 05/23/2019 1428   CREATININE 1.20 05/23/2019 1428   CREATININE  0.95 07/09/2012 1046   CALCIUM 9.6 05/23/2019 1418   PROT 7.6 05/23/2019 1418   ALBUMIN 4.2 05/23/2019 1418   AST 38 05/23/2019 1418   ALT 46 (H) 05/23/2019 1418   ALKPHOS 64 05/23/2019 1418   BILITOT 0.8 05/23/2019 1418   GFRNONAA 55 (L) 05/23/2019 1418   GFRNONAA >60 07/09/2012 1046   GFRAA >60 05/23/2019 1418   GFRAA >60 07/09/2012 1046    glycosylated hemoglobin  Lipid Panel     Component Value Date/Time   CHOL 212 (H) 05/24/2019 0227   TRIG 181 (H) 05/24/2019 0227   HDL 32 (L) 05/24/2019 0227   CHOLHDL 6.6 05/24/2019 0227   VLDL 36 05/24/2019 0227   LDLCALC 144 (H) 05/24/2019 0227   LDLDIRECT 140.0 02/08/2018 0844     Imaging I have reviewed images in epic and the results pertinent to this consultation are: MRI of the brain shows chronic encephalomalacia in the right temporal lobe which is new since 2013 and likely a sequela of ischemic stroke given the appearance of cortical laminar necrosis.  Assessment: 58 year old man presented as an acute code stroke for sudden onset of headache, seeing flashes and eyes, eyelid fluttering and possible some shaking of his body as well.  Initial examination was nonfocal and he does not given TPA because of that nonfocal exam.  He was only confused and could not answer the orientation questions at that time. Today's exam has completely returned back to baseline. There was no witnessed tonic-clonic activity. Imaging reveals right temporal lobe encephalomalacia which is new from another scan from 2013. He does not recollect having had a stroke in the past. EEG shows right frontotemporal region cortical dysfunction with continuous low voltage theta.  Putting all this together, and the most plausible explanation, because of the absence of an acute stroke and the existing encephalomalacia in the right temporal region along with the EEG abnormalities is that there possibly has been an underlying electrographic event such as a seizure that  brought him in.  Has returned back to baseline and no recollection of the events also favors this.  I will treat him for seizures.  Impression: Seizures  Recommendations: Load with Keppra 1 g IV now Continue Keppra 500 twice daily Seizure precautions as detailed below, which I discussed in detail with the patient. He wanted to know if he could drive locally and can be very "careful" while driving - I explained that he absolutely can not drive and his wife was at bedside who verbalized understanding of the instructions and seizure precautions. Discussed the [place] law of no driving unless seizure free for 6 months and made sure that both patient and wife verbalized understanding.     -- Amie Portland, MD [facility] Pager: [phone] If 7pm to 7am, please call on call as listed on AMION.   Per [facility] statutes, patients with seizures are not allowed to  drive until they have been seizure-free for six months.   Use caution when using heavy equipment or power tools. Avoid working on ladders or at heights. Take showers instead of baths. Ensure the water temperature is not too high on the home water heater. Do not go swimming alone. Do not lock yourself in a room alone (i.e. bathroom). When caring for infants or small children, sit down when holding, feeding, or changing them to minimize risk of injury to the child in the event you have a seizure. Maintain good sleep hygiene. Avoid alcohol.   If patient has another seizure, call 911 and bring them back to the ED if: A. The seizure lasts longer than 5 minutes.  B. The patient doesn't wake shortly after the seizure or has new problems such as difficulty seeing, speaking or moving following the seizure C. The patient was injured during the seizure D. The patient has a temperature over 102 F (39C) E. The patient vomited during the seizure and now is having trouble breathing

## 2019-05-24 NOTE — Progress Notes (Signed)
  Echocardiogram 2D Echocardiogram has been performed.  Matilde Bash 05/24/2019, 10:00 AM

## 2019-05-24 NOTE — Discharge Summary (Signed)
Physician Discharge Summary  Timothy Garrison [id number] DOB: 1961-05-19 DOA: 05/23/2019  PCP: Einar Pheasant, MD  Admit date: 05/23/2019 Discharge date: 05/24/2019  Admitted From: home Discharge disposition: home   Recommendations for Outpatient Follow-Up:   Referral to neurology for seizures Outpatient follow up of carotids  Discharge Diagnosis:   Principal Problem: seizures Active Problems:   Essential hypertension, benign   History of malignant neoplasm of oropharynx    Discharge Condition: Improved.  Diet recommendation: Low sodium, heart healthy.  Wound care: None.  Code status: Full.   History of Present Illness:   Timothy Garrison is a 58 y.o. 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  He was working on a car and his "head got to wobbly, I was getting dizzy like I was getting faint."  He got into the house and started vomiting.  He reports possible mild confusion.  No N/W/T of arms/legs.  He did not lose consciousness.  He does not think he hit his head or bit his tongue.  He felt fine earlier today, "like normal."  Currently, he is less dizzy but still a little dizzy and nauseated and with chills.  No cough, fever, diarrhea.   Hospital Course by Problem:   Seizures: Imaging reveals right temporal lobe encephalomalacia which is new from another scan from 2013. He does not recollect having had a stroke in the past. EEG shows right frontotemporal region cortical dysfunction with continuous low voltage theta per neurology: Load with Keppra 1 g IV now Continue Keppra 500 twice daily Seizure precautions given to both patient and wife  Sepsis -ruled out  H/o Oropharyngeal CA Follows at  [facility]:   neurology   Discharge Exam:   Vitals:   05/24/19 0500 05/24/19 0750  BP: 114/70 123/79  Pulse: 90 79  Resp: 18 18  Temp: 98.6 F (37 C) 99.2 F (37.3 C)  SpO2: 96% 96%   Vitals:   05/23/19 2230 05/23/19 2305 05/24/19 0500 05/24/19 0750  BP: 110/71 133/76 114/70 123/79  Pulse:  83 90 79  Resp: 14 20 18 18   Temp:  99 F (37.2 C) 98.6 F (37 C) 99.2 F (37.3 C)  TempSrc:  Oral Oral Oral  SpO2:  100% 96% 96%  Weight:      Height:        General exam: Appears calm and comfortable.   The results of significant diagnostics from this hospitalization (including imaging, microbiology, ancillary and laboratory) are listed below for reference.     Procedures and Diagnostic Studies:   Ct Angio Head W Or Wo Contrast  Addendum Date: 05/23/2019   ADDENDUM REPORT: 05/23/2019 15:13 ADDENDUM: Study discussed by telephone with Dr. Rosalin Hawking on 05/23/2019 at 1509 hours. We agree that visually the proximal Left ICA stenosis appears high-grade, and therefore may be numerically underestimated at 60%. Electronically Signed   By: Genevie Ann M.D.   On: 05/23/2019 15:13   Result Date: 05/23/2019 CLINICAL DATA:  57 year old male code stroke presentation. EXAM: CT ANGIOGRAPHY HEAD AND NECK TECHNIQUE: Multidetector CT imaging of the head and neck was performed using the standard protocol during bolus administration of intravenous contrast. Multiplanar CT image reconstructions and MIPs were obtained  to evaluate the vascular anatomy. Carotid stenosis measurements (when applicable) are obtained utilizing NASCET criteria, using the distal internal carotid diameter as the denominator. CONTRAST:  86mL OMNIPAQUE IOHEXOL 350 MG/ML SOLN COMPARISON:  Plain head CT 1430 hours today. FINDINGS: CTA NECK Skeleton: No acute osseous abnormality identified. Upper chest: Negative. Other neck: No acute findings. Aortic arch: Moderate for age Calcified aortic  atherosclerosis. Bovine type arch configuration. Right carotid system: No brachiocephalic artery or right CCA origin stenosis despite plaque. Plaque in the right CCA proximal to the bifurcation including abundant soft plaque medially at the level of the hypopharynx on series 5, image 113, but no significant stenosis. Additional calcified plaque at the right ICA origin and bulb but no significant stenosis. Left carotid system: Bovine type left CCA origin with about 50% stenosis due to calcified plaque (series 5, image 154). Intermittent soft plaque in the left CCA without stenosis proximal to the bifurcation. At the bifurcation there is bulky calcified plaque affecting the posterior left ICA origin and bulb. Stenosis is numerically estimated at 60 % with respect to the distal vessel on series 9, image 137. Patent left ICA to the skull base. Vertebral arteries: No significant proximal right subclavian artery stenosis despite soft and calcified plaque. Similar plaque at the right vertebral artery origin and V1 segment with moderate stenosis (series 7, images 282 through 273). The right vertebral is dominant and patent to the skull base without additional stenosis, despite some V3 segment calcified plaque. No proximal left subclavian artery stenosis despite plaque. Diminutive left vertebral artery with a normal origin on series 7, image 282. The left vertebral remains diminutive but patent to the skull base. CTA HEAD Posterior circulation: Dominant right vertebral artery with mild V4 segment calcified plaque and no stenosis. The right vertebral supplies the basilar. Patent right PICA origin. Diminutive left vertebral artery which appears to functionally terminates in the PICA. Patent but diminutive and mildly irregular basilar artery, no focal basilar stenosis. Patent SCA and left PCA origin. Fetal right PCA origin. Bilateral PCA branches are within normal limits. Anterior circulation: Both ICA siphons are patent. On  the left there is moderate calcified plaque with only mild stenosis. On the right there is mild to moderate calcified plaque and similar mild supraclinoid stenosis. Normal right ophthalmic and posterior communicating artery origins. Patent carotid termini, MCA and ACA origins. Anterior communicating artery and bilateral ACA branches are within normal limits. Left MCA M1 segment and early bifurcation are patent without stenosis. Left MCA branches appear patent with mild irregularity. Right MCA M1 and bifurcation are patent without stenosis. Right MCA branches are patent with similar mild irregularity. Venous sinuses: Early contrast phase, not well evaluated. Anatomic variants: Dominant right vertebral artery which supplies the basilar. Diminutive left vertebral functionally terminates in PICA. Fetal right PCA origin. Review of the MIP images confirms the above findings IMPRESSION: 1. Negative for large vessel occlusion. 2. Positive for abundant atherosclerotic plaque in the proximal great vessels, at the carotid bifurcations, and V1 segment of the dominant right vertebral artery. Proximal left ICA stenosis estimated at 60%. Moderate proximal right vertebral artery stenosis. 3. Intracranial atherosclerosis including bilateral calcified ICA siphons but no significant intracranial stenosis. 4. Aortic Atherosclerosis (ICD10-I70.0). These results were communicated to Dr. Erlinda Hong At 3:06 pm on 05/23/2019 by text page via the [facility] messaging system. Electronically Signed: By: Genevie Ann M.D. On: 05/23/2019 15:06   Ct Angio Neck W Or Wo Contrast  Addendum Date: 05/23/2019   ADDENDUM REPORT: 05/23/2019 15:13  ADDENDUM: Study discussed by telephone with Dr. Rosalin Hawking on 05/23/2019 at 1509 hours. We agree that visually the proximal Left ICA stenosis appears high-grade, and therefore may be numerically underestimated at 60%. Electronically Signed   By: Genevie Ann M.D.   On: 05/23/2019 15:13   Result Date: 05/23/2019 CLINICAL DATA:   58 year old male code stroke presentation. EXAM: CT ANGIOGRAPHY HEAD AND NECK TECHNIQUE: Multidetector CT imaging of the head and neck was performed using the standard protocol during bolus administration of intravenous contrast. Multiplanar CT image reconstructions and MIPs were obtained to evaluate the vascular anatomy. Carotid stenosis measurements (when applicable) are obtained utilizing NASCET criteria, using the distal internal carotid diameter as the denominator. CONTRAST:  70mL OMNIPAQUE IOHEXOL 350 MG/ML SOLN COMPARISON:  Plain head CT 1430 hours today. FINDINGS: CTA NECK Skeleton: No acute osseous abnormality identified. Upper chest: Negative. Other neck: No acute findings. Aortic arch: Moderate for age Calcified aortic atherosclerosis. Bovine type arch configuration. Right carotid system: No brachiocephalic artery or right CCA origin stenosis despite plaque. Plaque in the right CCA proximal to the bifurcation including abundant soft plaque medially at the level of the hypopharynx on series 5, image 113, but no significant stenosis. Additional calcified plaque at the right ICA origin and bulb but no significant stenosis. Left carotid system: Bovine type left CCA origin with about 50% stenosis due to calcified plaque (series 5, image 154). Intermittent soft plaque in the left CCA without stenosis proximal to the bifurcation. At the bifurcation there is bulky calcified plaque affecting the posterior left ICA origin and bulb. Stenosis is numerically estimated at 60 % with respect to the distal vessel on series 9, image 137. Patent left ICA to the skull base. Vertebral arteries: No significant proximal right subclavian artery stenosis despite soft and calcified plaque. Similar plaque at the right vertebral artery origin and V1 segment with moderate stenosis (series 7, images 282 through 273). The right vertebral is dominant and patent to the skull base without additional stenosis, despite some V3 segment  calcified plaque. No proximal left subclavian artery stenosis despite plaque. Diminutive left vertebral artery with a normal origin on series 7, image 282. The left vertebral remains diminutive but patent to the skull base. CTA HEAD Posterior circulation: Dominant right vertebral artery with mild V4 segment calcified plaque and no stenosis. The right vertebral supplies the basilar. Patent right PICA origin. Diminutive left vertebral artery which appears to functionally terminates in the PICA. Patent but diminutive and mildly irregular basilar artery, no focal basilar stenosis. Patent SCA and left PCA origin. Fetal right PCA origin. Bilateral PCA branches are within normal limits. Anterior circulation: Both ICA siphons are patent. On the left there is moderate calcified plaque with only mild stenosis. On the right there is mild to moderate calcified plaque and similar mild supraclinoid stenosis. Normal right ophthalmic and posterior communicating artery origins. Patent carotid termini, MCA and ACA origins. Anterior communicating artery and bilateral ACA branches are within normal limits. Left MCA M1 segment and early bifurcation are patent without stenosis. Left MCA branches appear patent with mild irregularity. Right MCA M1 and bifurcation are patent without stenosis. Right MCA branches are patent with similar mild irregularity. Venous sinuses: Early contrast phase, not well evaluated. Anatomic variants: Dominant right vertebral artery which supplies the basilar. Diminutive left vertebral functionally terminates in PICA. Fetal right PCA origin. Review of the MIP images confirms the above findings IMPRESSION: 1. Negative for large vessel occlusion. 2. Positive for abundant atherosclerotic plaque in the  proximal great vessels, at the carotid bifurcations, and V1 segment of the dominant right vertebral artery. Proximal left ICA stenosis estimated at 60%. Moderate proximal right vertebral artery stenosis. 3.  Intracranial atherosclerosis including bilateral calcified ICA siphons but no significant intracranial stenosis. 4. Aortic Atherosclerosis (ICD10-I70.0). These results were communicated to Dr. Erlinda Hong At 3:06 pm on 05/23/2019 by text page via the [facility] messaging system. Electronically Signed: By: Genevie Ann M.D. On: 05/23/2019 15:06   Mr Brain Wo Contrast  Result Date: 05/23/2019 CLINICAL DATA:  Encephalopathy. History metastatic squamous cell carcinoma of the tonsils. Dizziness with vomiting and confusion. EXAM: MRI HEAD WITHOUT CONTRAST TECHNIQUE: Multiplanar, multiecho pulse sequences of the brain and surrounding structures were obtained without intravenous contrast. COMPARISON:  Brain MRI 07/09/2012 FINDINGS: BRAIN: There is no acute infarct, acute hemorrhage or extra-axial collection. Encephalomalacia the right temporal lobe is new since the prior study. White matter signal is normal. The cerebral and cerebellar volume are age-appropriate. There is no hydrocephalus. The midline structures are normal. VASCULAR: The major intracranial arterial and venous sinus flow voids are normal. Susceptibility-sensitive sequences show no chronic microhemorrhage or superficial siderosis. SKULL AND UPPER CERVICAL SPINE: Calvarial bone marrow signal is normal. There is no skull base mass. The visualized upper cervical spine and soft tissues are normal. SINUSES/ORBITS: There are no fluid levels or advanced mucosal thickening. The mastoid air cells and middle ear cavities are free of fluid. The orbits are normal. IMPRESSION: 1. No acute intracranial abnormality. 2. Encephalomalacia of the right temporal lobe is new since 2013 and likely a sequela of a remote ischemic stroke. Electronically Signed   By: Ulyses Jarred M.D.   On: 05/23/2019 19:02   Dg Chest Portable 1 View  Result Date: 05/23/2019 CLINICAL DATA:  Altered mental status. EXAM: PORTABLE CHEST 1 VIEW COMPARISON:  02/25/2006 FINDINGS: Cardiac silhouette is normal in size.  No mediastinal or hilar masses. No evidence of adenopathy. Clear lungs.  No pleural effusion or pneumothorax. Skeletal structures are grossly intact. IMPRESSION: No active disease. Electronically Signed   By: Lajean Manes M.D.   On: 05/23/2019 15:27   Ct Head Code Stroke Wo Contrast  Result Date: 05/23/2019 CLINICAL DATA:  Code stroke. 58 year old male with headache and difficulty following commands. Last seen normal 1300 hours. EXAM: CT HEAD WITHOUT CONTRAST TECHNIQUE: Contiguous axial images were obtained from the base of the skull through the vertex without intravenous contrast. COMPARISON:  Brain MRI 07/09/2012 [facility], head CT 04/06/2011. FINDINGS: Brain: Chronic appearing encephalomalacia along the lateral right temporal lobe, at the confluence with the inferior right parietal and anterior occipital lobes. Evidence of laminar necrosis. Small chronic appearing infarct in the left cerebellum on series 3, image 11. Elsewhere normal gray-white matter differentiation. No midline shift, ventriculomegaly, mass effect, evidence of mass lesion, intracranial hemorrhage or evidence of cortically based acute infarction. Vascular: Calcified atherosclerosis at the skull base. No suspicious intracranial vascular hyperdensity. Skull: No acute osseous abnormality identified. Sinuses/Orbits: Chronic left posterior ethmoid mucosal thickening, otherwise well pneumatized paranasal sinuses and mastoids. Other: Visualized orbits and scalp soft tissues are within normal limits. ASPECTS [facility] Stroke Program Early CT Score) Total score (0-10 with 10 being normal): 10 (chronic encephalomalacia in the right hemisphere). IMPRESSION: 1. No acute cortically based infarct or acute intracranial hemorrhage identified. 2. New since 2012 but Chronic appearing infarcts of the lateral right temporal lobe (moderate size) and the left cerebellum (small). 3.  ASPECTS 10. 4. These results were communicated to Dr. Erlinda Hong at  2:34 pm on 05/23/2019 by text page via the [facility] messaging system. Electronically Signed   By: Genevie Ann M.D.   On: 05/23/2019 14:35   Vas US Carotid (at [place] Only)  Result Date: 05/24/2019 Carotid Arterial Duplex Study Indications:       TIA, Speech disturbance and Visual disturbance. Risk Factors:      Hypertension, hyperlipidemia. Comparison Study:  Carotid artery duplex 02/15/2012. Performing Technologist: Maudry Mayhew MHA, RDMS, RVT, RDCS  Examination Guidelines: A complete evaluation includes B-mode imaging, spectral Doppler, color Doppler, and power Doppler as needed of all accessible portions of each vessel. Bilateral testing is considered an integral part of a complete examination. Limited examinations for reoccurring indications may be performed as noted.  Right Carotid Findings: +----------+--------+-------+--------+--------------------------------+--------+             PSV cm/s EDV     Stenosis Plaque Description               Comments                       cm/s                                                        +----------+--------+-------+--------+--------------------------------+--------+  CCA Prox   122      28               smooth and homogeneous                     +----------+--------+-------+--------+--------------------------------+--------+  CCA Distal 96       29                                                          +----------+--------+-------+--------+--------------------------------+--------+  ICA Prox   123      41      40-59%   smooth, heterogenous and                                                         calcific                                   +----------+--------+-------+--------+--------------------------------+--------+  ICA Distal 90       31                                                          +----------+--------+-------+--------+--------------------------------+--------+  ECA        91       15               smooth, heterogenous and  calcific                                   +----------+--------+-------+--------+--------------------------------+--------+ +----------+--------+-------+----------------+-------------------+             PSV cm/s EDV cms Describe         Arm Pressure (mmHG)  +----------+--------+-------+----------------+-------------------+  Subclavian 150              Multiphasic, WNL                      +----------+--------+-------+----------------+-------------------+ +---------+--------+--+--------+--+---------+  Vertebral PSV cm/s 85 EDV cm/s 25 Antegrade  +---------+--------+--+--------+--+---------+  Left Carotid Findings: +----------+--------+-------+--------+--------------------------------+--------+             PSV cm/s EDV     Stenosis Plaque Description               Comments                       cm/s                                                        +----------+--------+-------+--------+--------------------------------+--------+  CCA Prox   139      39               smooth and heterogenous                    +----------+--------+-------+--------+--------------------------------+--------+  CCA Distal 135      42               smooth, heterogenous and                                                         calcific                                   +----------+--------+-------+--------+--------------------------------+--------+  ICA Prox   161      50      40-59%   smooth, heterogenous and                                                         calcific                                   +----------+--------+-------+--------+--------------------------------+--------+  ICA Distal 74       31                                                          +----------+--------+-------+--------+--------------------------------+--------+  ECA  178      33               calcific                                   +----------+--------+-------+--------+--------------------------------+--------+  +----------+--------+--------+----------------+-------------------+             PSV cm/s EDV cm/s Describe         Arm Pressure (mmHG)  +----------+--------+--------+----------------+-------------------+  Subclavian 161               Multiphasic, WNL                      +----------+--------+--------+----------------+-------------------+ +---------+--------+--+--------+-+---------+  Vertebral PSV cm/s 31 EDV cm/s 9 Antegrade  +---------+--------+--+--------+-+---------+  Summary: Right Carotid: Velocities in the right ICA are consistent with a low range                40-59% stenosis. Left Carotid: Velocities in the left ICA are consistent with a 40-59% stenosis. Vertebrals:  Bilateral vertebral arteries demonstrate antegrade flow. Subclavians: Normal flow hemodynamics were seen in bilateral subclavian              arteries. *See table(s) above for measurements and observations.     Preliminary      Labs:   Basic Metabolic Panel: Recent Labs  Lab 05/23/19 1418 05/23/19 1428 05/24/19 1131  NA 134* 135 138  K 3.6 3.5 3.7  CL 99 102 109  CO2 15*  --  22  GLUCOSE 224* 224* 112*  BUN 20 21* 10  CREATININE 1.39* 1.20 1.19  CALCIUM 9.6  --  8.5*   GFR Estimated Creatinine Clearance: 65.5 mL/min (by C-G formula based on SCr of 1.19 mg/dL). Liver Function Tests: Recent Labs  Lab 05/23/19 1418  AST 38  ALT 46*  ALKPHOS 64  BILITOT 0.8  PROT 7.6  ALBUMIN 4.2   No results for input(s): LIPASE, AMYLASE in the last 168 hours. No results for input(s): AMMONIA in the last 168 hours. Coagulation profile Recent Labs  Lab 05/23/19 1418  INR 1.1    CBC: Recent Labs  Lab 05/23/19 1418 05/23/19 1428 05/24/19 1131  WBC 23.1*  --  9.7  NEUTROABS 19.0*  --   --   HGB 13.4 13.9 11.2*  HCT 40.2 41.0 34.6*  MCV 85.0  --  85.2  PLT 271  --  240   Cardiac Enzymes: No results for input(s): CKTOTAL, CKMB, CKMBINDEX, TROPONINI in the last 168 hours. BNP: Invalid input(s):  POCBNP CBG: Recent Labs  Lab 05/23/19 2158 05/24/19 0752 05/24/19 1227  GLUCAP 108* 95 124*   D-Dimer No results for input(s): DDIMER in the last 72 hours. Hgb A1c Recent Labs    05/23/19 1648  HGBA1C 6.0*   Lipid Profile Recent Labs    05/24/19 0227  CHOL 212*  HDL 32*  LDLCALC 144*  TRIG 181*  CHOLHDL 6.6   Thyroid function studies No results for input(s): TSH, T4TOTAL, T3FREE, THYROIDAB in the last 72 hours.  Invalid input(s): FREET3 Anemia work up No results for input(s): VITAMINB12, FOLATE, FERRITIN, TIBC, IRON, RETICCTPCT in the last 72 hours. Microbiology Recent Results (from the past 240 hour(s))  SARS CORONAVIRUS 2 (TAT 6-24 HRS) Nasopharyngeal Nasopharyngeal Swab     Status: None   Collection Time: 05/23/19  4:48 PM   Specimen: Nasopharyngeal Swab  Result Value Ref Range Status  SARS Coronavirus 2 NEGATIVE NEGATIVE Final    Comment: (NOTE) SARS-CoV-2 target nucleic acids are NOT DETECTED. The SARS-CoV-2 RNA is generally detectable in upper and lower respiratory specimens during the acute phase of infection. Negative results do not preclude SARS-CoV-2 infection, do not rule out co-infections with other pathogens, and should not be used as the sole basis for treatment or other patient management decisions. Negative results must be combined with clinical observations, patient history, and epidemiological information. The expected result is Negative. Fact Sheet for Patients: SugarRoll.be Fact Sheet for Healthcare Providers: https://www.woods-mathews.com/ This test is not yet approved or cleared by the [place] FDA and  has been authorized for detection and/or diagnosis of SARS-CoV-2 by FDA under an Emergency Use Authorization (EUA). This EUA will remain  in effect (meaning this test can be used) for the duration of the COVID-19 declaration under Section 56 4(b)(1) of the Act, 21 U.S.C. section  360bbb-3(b)(1), unless the authorization is terminated or revoked sooner. Performed at [facility], [place] [address], [place], [place] [address]   Culture, blood (x 2)     Status: None (Preliminary result)   Collection Time: 05/23/19  8:14 PM   Specimen: BLOOD RIGHT HAND  Result Value Ref Range Status   Specimen Description BLOOD RIGHT HAND  Final   Special Requests   Final    BOTTLES DRAWN AEROBIC AND ANAEROBIC Blood Culture adequate volume   Culture   Final    NO GROWTH < 24 HOURS Performed at [facility], [place] [address] [place], [place] [address]    Report Status PENDING  Incomplete  Culture, blood (x 2)     Status: None (Preliminary result)   Collection Time: 05/23/19  8:25 PM   Specimen: BLOOD  Result Value Ref Range Status   Specimen Description BLOOD LEFT ANTECUBITAL  Final   Special Requests   Final    BOTTLES DRAWN AEROBIC AND ANAEROBIC Blood Culture adequate volume   Culture   Final    NO GROWTH < 24 HOURS Performed at [facility], [place] [address], [place], [place] [address]    Report Status PENDING  Incomplete     Discharge Instructions:   Discharge Instructions    Ambulatory referral to Neurology   Complete by: As directed    An appointment is requested in approximately: 2-4 weeks   Diet - low sodium heart healthy   Complete by: As directed    Discharge instructions   Complete by: As directed    Per [facility] statutes, patients with seizures are not allowed to drive until they have been seizure-free for six months.   Use caution when using heavy equipment or power tools. Avoid working on ladders or at heights. Take showers instead of baths. Ensure the water temperature is not too high on the home water heater. Do not go swimming alone. Do not lock yourself in a room alone (i.e. bathroom). When caring for infants or small children, sit down when holding, feeding, or changing them to minimize risk of injury to the child in the event  you have a seizure. Maintain good sleep hygiene. Avoid alcohol.   If patienthas another seizure, call 911 and bring them back to the ED if: A. The seizure lasts longer than 5 minutes.  B. The patient doesn't wake shortly after the seizure or has new problems such as difficulty seeing, speaking or moving following the seizure C. The patient was injured during the seizure D.  The patient has a temperature over 102 F (39C) E. The patient vomited during the seizure and now is having trouble breathing   Increase activity slowly   Complete by: As directed      Allergies as of 05/24/2019   No Known Allergies     Medication List    TAKE these medications   apixaban 5 MG Tabs tablet Commonly known as: Eliquis Take 1 tablet (5 mg total) by mouth every 12 (twelve) hours.   levETIRAcetam 500 MG tablet Commonly known as: KEPPRA Take 1 tablet (500 mg total) by mouth 2 (two) times daily.   lisinopril 20 MG tablet Commonly known as: ZESTRIL TAKE 1 TABLET BY MOUTH EVERY DAY   multivitamin capsule Take by mouth.   omeprazole 20 MG capsule Commonly known as: PRILOSEC TAKE 1 CAPSULE (20 MG TOTAL) BY MOUTH 2 (TWO) TIMES DAILY. What changed: when to take this   pravastatin 40 MG tablet Commonly known as: PRAVACHOL Take 1 tablet (40 mg total) by mouth daily. What changed:   medication strength  how much to take      Follow-up Information    Einar Pheasant, MD Follow up in 1 week(s).   Specialty: Internal Medicine Contact information: [id number] [place] [address] [phone]            Time coordinating discharge: 35 min  Signed:  Geradine Girt DO  [facility] 05/24/2019, 4:37 PM

## 2019-05-24 NOTE — Progress Notes (Signed)
Pt arrived to the floor alert and oriented times four. Pt did ambulate to the restroom before ambulating to the bed. Pt has a small scratch to his left ankle area from his dog, other then that he has no skin issues to report per the required 2 RN skin check. Pt was educated about the floor and the call bell system. Pt stated that he understands that he needs to [place] the call bell before getting out of bed. Call bell and phone was placed in his reach.

## 2019-05-24 NOTE — Progress Notes (Signed)
Initial Nutrition Assessment  DOCUMENTATION CODES:   Not applicable  INTERVENTION:   -Magic cup TID with meals, each supplement provides 290 kcal and 9 grams of protein -MVI with minerals daily  NUTRITION DIAGNOSIS:   Increased nutrient needs related to chronic illness(h/o oropharyngeal cancer) as evidenced by estimated needs.  GOAL:   Patient will meet greater than or equal to 90% of their needs  MONITOR:   PO intake, Supplement acceptance, Labs, Weight trends, Skin, I & O's  REASON FOR ASSESSMENT:   Consult Assessment of nutrition requirement/status  ASSESSMENT:   Timothy Garrison is a 58 y.o. Caucasian male with PMH of HTN, PVD, tonsillar cancer s/p chemo and radiation, HLD, on eliquis not sure why presented as code stroke.  Pt admitted with sepsis and AMS.   Reviewed I/O's: +351 ml x 24 hours  UOP: 1.3 L x 24 hours  Attempted to speak with pt via phone, however, no answer. Unable to obtain further nutrition-related history at this time.   Per chart review, pt with history of oropharyngeal cancer s/p radiation treatments. Per oncology RD notes in July 2018, wt during last visit was 144# (pt with excellent weight gain and noted wt gain trends over the past year). Pt was using weight gainer powder and adding it to smoothies and ice cream during treatments.   No meal completion records available to assess intake. Suspect good oral intake PTA due to weight gain. Per RD notes, pt does not tolerate nutritional supplements well.   Lab Results  Component Value Date   HGBA1C 6.0 (H) 05/23/2019   PTA DM medications are none.   Labs reviewed: CBGS: 95 (inpatient orders for glycemic control are 0-15 units insulin aspart TID with meals and0-5 units insulin aspart q HS).   Diet Order:   Diet Order            Diet heart healthy/carb modified Room service appropriate? Yes; Fluid consistency: Thin  Diet effective now              EDUCATION NEEDS:   No education needs  have been identified at this time  Skin:  Skin Assessment: Reviewed RN Assessment  Last BM:  05/23/19  Height:   Ht Readings from Last 1 Encounters:  05/23/19 5\' 8"  (1.727 m)    Weight:   Wt Readings from Last 1 Encounters:  05/23/19 72.9 kg    Ideal Body Weight:  70 kg  BMI:  Body mass index is 24.44 kg/m.  Estimated Nutritional Needs:   Kcal:  2000-2200  Protein:  95-100 grams  Fluid:  > 2 L    Derionna Salvador A. Jimmye Norman, RD, LDN, [place] Registered Dietitian II Certified Diabetes Care and Education Specialist Pager: [phone] After hours Pager: [phone]

## 2019-05-25 ENCOUNTER — Telehealth: Payer: Self-pay

## 2019-05-25 NOTE — Telephone Encounter (Addendum)
Transition Care Management Follow-up Telephone Call   Date discharged? 05/24/19   How have you been since you were released from the hospital? Patient states, " I am slower and my bearings are a little different, but I am giving myself time to recover fully." Denies seeing flashing lights, blurred vision, dizziness or any other symptoms which presented previously.    Do you understand why you were in the hospital? Yes, I had seizures.   Do you understand the discharge instructions? Yes, do not drive until seizure free for 6 months. Take showers instead of baths. Water temperature not too high on hot water tank. Avoid alcohol. Avoid working on ladders, or at heights.    Where were you discharged to? Home   Items Reviewed:  Medications reviewed: Yes.   Allergies reviewed: Yes.  Dietary changes reviewed: Yes, heart healthy and low sodium.  Referrals reviewed: Yes, Neurology.   Functional Questionnaire:   Activities of Daily Living (ADLs):   He states they are independent in the following: Independent in all ADLs States they require assistance with the following: He does not require assistance at this time.    Any transportation issues/concerns?: None at this time.    Any patient concerns? He would like to have his labs drawn before or during hfu. He is okay to cancel lab appointment in November. Has concerns about increasing pravastatin to 40mg .   Confirmed importance and date/time of follow-up visits scheduled Yes, tentatively holding for 05/29/19 or 05/30/19. Patient to call back and confirm after speaking to wife.   Provider Appointment booked with Dr. Nicki Reaper, pcp.   Confirmed with patient if condition begins to worsen call PCP or go to the ER.  Patient was given the office number and encouraged to call back with question or concerns.  : Yes

## 2019-05-25 NOTE — Telephone Encounter (Signed)
Per review of hospital follow note, pt concerned about increasing pravastatin to 40mg  q day.  I can discuss this with him more at his appt.  Also, was questioning labs.  Can check labs at appt if desires.

## 2019-05-26 ENCOUNTER — Encounter: Payer: Self-pay | Admitting: Internal Medicine

## 2019-05-26 NOTE — Telephone Encounter (Signed)
Patient has scheduled to be in office on 05/29/19 at 9:00 and will be fasting.

## 2019-05-28 LAB — CULTURE, BLOOD (ROUTINE X 2)
Culture: NO GROWTH
Culture: NO GROWTH
Special Requests: ADEQUATE
Special Requests: ADEQUATE

## 2019-05-29 ENCOUNTER — Encounter: Payer: Self-pay | Admitting: Internal Medicine

## 2019-05-29 ENCOUNTER — Ambulatory Visit (INDEPENDENT_AMBULATORY_CARE_PROVIDER_SITE_OTHER): Payer: Medicare Other | Admitting: Internal Medicine

## 2019-05-29 ENCOUNTER — Other Ambulatory Visit: Payer: Self-pay

## 2019-05-29 DIAGNOSIS — E7800 Pure hypercholesterolemia, unspecified: Secondary | ICD-10-CM | POA: Diagnosis not present

## 2019-05-29 DIAGNOSIS — I10 Essential (primary) hypertension: Secondary | ICD-10-CM

## 2019-05-29 DIAGNOSIS — R42 Dizziness and giddiness: Secondary | ICD-10-CM

## 2019-05-29 DIAGNOSIS — G40909 Epilepsy, unspecified, not intractable, without status epilepticus: Secondary | ICD-10-CM

## 2019-05-29 DIAGNOSIS — D649 Anemia, unspecified: Secondary | ICD-10-CM | POA: Diagnosis not present

## 2019-05-29 DIAGNOSIS — I700 Atherosclerosis of aorta: Secondary | ICD-10-CM

## 2019-05-29 DIAGNOSIS — Z85819 Personal history of malignant neoplasm of unspecified site of lip, oral cavity, and pharynx: Secondary | ICD-10-CM

## 2019-05-29 DIAGNOSIS — K219 Gastro-esophageal reflux disease without esophagitis: Secondary | ICD-10-CM

## 2019-05-29 LAB — HEPATIC FUNCTION PANEL
ALT: 41 U/L (ref 0–53)
AST: 36 U/L (ref 0–37)
Albumin: 4.6 g/dL (ref 3.5–5.2)
Alkaline Phosphatase: 59 U/L (ref 39–117)
Bilirubin, Direct: 0.1 mg/dL (ref 0.0–0.3)
Total Bilirubin: 0.6 mg/dL (ref 0.2–1.2)
Total Protein: 7.6 g/dL (ref 6.0–8.3)

## 2019-05-29 LAB — BASIC METABOLIC PANEL
BUN: 20 mg/dL (ref 6–23)
CO2: 28 mEq/L (ref 19–32)
Calcium: 10.3 mg/dL (ref 8.4–10.5)
Chloride: 100 mEq/L (ref 96–112)
Creatinine, Ser: 1.13 mg/dL (ref 0.40–1.50)
GFR: 66.44 mL/min (ref >60.00)
Glucose, Bld: 91 mg/dL (ref 70–99)
Potassium: 4.6 mEq/L (ref 3.5–5.1)
Sodium: 136 mEq/L (ref 135–145)

## 2019-05-29 LAB — LIPID PANEL
Cholesterol: 277 mg/dL — ABNORMAL HIGH (ref 0–200)
HDL: 42.2 mg/dL (ref >39.00)
NonHDL: 234.79
Total CHOL/HDL Ratio: 7
Triglycerides: 227 mg/dL — ABNORMAL HIGH (ref 0.0–149.0)
VLDL: 45.4 mg/dL — ABNORMAL HIGH (ref 0.0–40.0)

## 2019-05-29 LAB — CBC WITH DIFFERENTIAL/PLATELET
Basophils Absolute: 0 10*3/uL (ref 0.0–0.1)
Basophils Relative: 0.5 % (ref 0.0–3.0)
Eosinophils Absolute: 0.2 10*3/uL (ref 0.0–0.7)
Eosinophils Relative: 1.6 % (ref 0.0–5.0)
HCT: 41.8 % (ref 39.0–52.0)
Hemoglobin: 13.9 g/dL (ref 13.0–17.0)
Lymphocytes Relative: 18.1 % (ref 12.0–46.0)
Lymphs Abs: 1.7 10*3/uL (ref 0.7–4.0)
MCHC: 33.2 g/dL (ref 30.0–36.0)
MCV: 84.8 fl (ref 78.0–100.0)
Monocytes Absolute: 0.7 10*3/uL (ref 0.1–1.0)
Monocytes Relative: 7.6 % (ref 3.0–12.0)
Neutro Abs: 6.7 10*3/uL (ref 1.4–7.7)
Neutrophils Relative %: 72.2 % (ref 43.0–77.0)
Platelets: 377 10*3/uL (ref 150.0–400.0)
RBC: 4.93 Mil/uL (ref 4.22–5.81)
RDW: 14.5 % (ref 11.5–15.5)
WBC: 9.3 10*3/uL (ref 4.0–10.5)

## 2019-05-29 LAB — VITAMIN B12: Vitamin B-12: 500 pg/mL (ref 211–911)

## 2019-05-29 LAB — FERRITIN: Ferritin: 69.9 ng/mL (ref 22.0–322.0)

## 2019-05-29 LAB — LDL CHOLESTEROL, DIRECT: Direct LDL: 204 mg/dL

## 2019-05-29 MED ORDER — NYSTATIN 100000 UNIT/ML MT SUSP: OROMUCOSAL | 0 refills | Status: DC

## 2019-05-29 NOTE — Progress Notes (Signed)
[name], male   DOB: 06/07/1961, 58 y.o.   MRN: [id number]   Subjective:    [name], male    DOB: [birth year], 58 y.o.   MRN: [id number]  HPI  Patient here for hospital follow up.  He was admitted 05/23/19 - 05/24/19.  He presented with feeling "wobbly and dizzy".  Initial concern - stroke.  He was workin on his car.  Felt dizzy.  Called a friend.  She took him into her house.  Started vomiting.  He reports he does not remember anything more until the ER.  States his friend reported some body shaking.  Admitted and had EEG which revealed right frontotemporal region cortical dysfunction with continuous low voltage theta.  MRI revealed encephalomalacia of the right temporal lobe - new since 2013 and per MRI report, felt to be likely a sequela of a remote stroke.  Neurology evaluated - loaded with keppra.  Recommended continuing oral keppra.  Was instructed to increase pravastatin.  States he remained on 20mg  of pravastatin since was just increased.  Had intolerance to lipitor and crestor. Reports since being home, he has had no seizures.  He is not driving.  He has felt a little dizzy.  He was questioning if the keppra could be the cause of the dizziness.  He stopped taking it yesterday.  Discussed the need to restart the keppra. No chest pain.  No sob.  No acid reflux.  No abdominal pain.  Bowels moving.  On eliquis for PAD.     Past Medical History:  Diagnosis Date   Basal cell carcinoma (BCC) of back    Basal cell carcinoma (BCC) of nasal sidewall    Dizziness    GERD (gastroesophageal reflux disease)    H/O blood clots    Hypercholesterolemia    Hypertension    hx of HBP readings   Metastatic squamous cell carcinoma ([place]) 2018   bx cervical lymph node   Vascular abnormality    VTE (venous thromboembolism) 2018   associated with chemotherapy   Past Surgical History:  Procedure Laterality Date   COLONOSCOPY     ESOPHAGOGASTRODUODENOSCOPY  (EGD) WITH PROPOFOL N/A 05/24/2015   Procedure: ESOPHAGOGASTRODUODENOSCOPY (EGD) WITH PROPOFOL;  Surgeon: Manya Silvas, MD;  Location: [facility];  Service: Endoscopy;  Laterality: N/A;   IR IMAGING GUIDED PORT INSERTION  12/09/2016   SAVORY DILATION N/A 05/24/2015   Procedure: SAVORY DILATION;  Surgeon: Manya Silvas, MD;  Location: [facility] ENDOSCOPY;  Service: Endoscopy;  Laterality: N/A;   Family History  Problem Relation Age of Onset   Hyperlipidemia Mother    Heart disease Mother    Hypertension Mother    Alzheimer's disease Mother    Prostate cancer Father    Stroke Father    Social History   Socioeconomic History   Marital status: Married    Spouse name: Not on file   Number of children: Not on file   Years of education: Not on file   Highest education level: Not on file  Occupational History   Occupation: Dealer  Social Needs   Financial resource strain: Not on file   Food insecurity    Worry: Not on file    Inability: Not on file   Transportation needs    Medical: Not on file    Non-medical: Not on file  Tobacco Use   Smoking status: Former Smoker    Quit date: 11/11/1996    Years since quitting: 16.5  Smokeless tobacco: Never Used  Substance and Sexual Activity   Alcohol use: No    Alcohol/week: 0.0 standard drinks    Comment: occasional   Drug use: No   Sexual activity: Yes  Lifestyle   Physical activity    Days per week: Not on file    Minutes per session: Not on file   Stress: Not on file  Relationships   Social connections    Talks on phone: Not on file    Gets together: Not on file    Attends religious service: Not on file    Active member of club or organization: Not on file    Attends meetings of clubs or organizations: Not on file    Relationship status: Not on file  Other Topics Concern   Not on file  Social History Narrative   Not on file    Outpatient Encounter Medications as of 05/29/2019    Medication Sig   apixaban (ELIQUIS) 5 MG TABS tablet Take 1 tablet (5 mg total) by mouth every 12 (twelve) hours.   levETIRAcetam (KEPPRA) 500 MG tablet Take 1 tablet (500 mg total) by mouth 2 (two) times daily.   lisinopril (ZESTRIL) 20 MG tablet TAKE 1 TABLET BY MOUTH EVERY DAY   Multiple Vitamin (MULTIVITAMIN) capsule Take by mouth.   nystatin (MYCOSTATIN) 100000 UNIT/ML suspension 68mls - swish and spit tid   omeprazole (PRILOSEC) 20 MG capsule TAKE 1 CAPSULE (20 MG TOTAL) BY MOUTH 2 (TWO) TIMES DAILY. (Patient taking differently: Take 20 mg by mouth daily. )   pravastatin (PRAVACHOL) 40 MG tablet Take 1 tablet (40 mg total) by mouth daily.   No facility-administered encounter medications on file as of 05/29/2019.    Review of Systems  Constitutional: Negative for appetite change and unexpected weight change.  HENT: Negative for congestion and sinus pressure.   Respiratory: Negative for cough, chest tightness and shortness of breath.   Cardiovascular: Negative for chest pain, palpitations and leg swelling.  Gastrointestinal: Negative for abdominal pain, diarrhea, nausea and vomiting.  Genitourinary: Negative for difficulty urinating and dysuria.  Musculoskeletal: Negative for joint swelling and myalgias.  Skin: Negative for color change and rash.  Neurological: Positive for dizziness and light-headedness. Negative for headaches.  Psychiatric/Behavioral: Negative for agitation and dysphoric mood.       Objective:    Physical Exam Constitutional:      General: He is not in acute distress.    Appearance: Normal appearance. He is well-developed.  HENT:     Head: Normocephalic and atraumatic.     Right Ear: External ear normal.     Left Ear: External ear normal.  Eyes:     General: No scleral icterus.       Right eye: No discharge.        Left eye: No discharge.     Conjunctiva/sclera: Conjunctivae normal.  Neck:     Musculoskeletal: Neck supple. No muscular  tenderness.  Cardiovascular:     Rate and Rhythm: Normal rate and regular rhythm.  Pulmonary:     Effort: Pulmonary effort is normal. No respiratory distress.     Breath sounds: Normal breath sounds.  Abdominal:     General: Bowel sounds are normal.     Palpations: Abdomen is soft.     Tenderness: There is no abdominal tenderness.  Musculoskeletal:        General: No swelling or tenderness.  Lymphadenopathy:     Cervical: No cervical adenopathy.  Skin:  Findings: No erythema or rash.  Neurological:     Mental Status: He is alert.  Psychiatric:        Mood and Affect: Mood normal.        Behavior: Behavior normal.     BP 138/86    Pulse 100    Temp 97.8 F (36.6 C)    Resp 16    Wt 159 lb (72.1 kg)    SpO2 98%    BMI 24.18 kg/m  Wt Readings from Last 3 Encounters:  05/29/19 159 lb (72.1 kg)  05/23/19 160 lb 11.5 oz (72.9 kg)  04/25/19 159 lb (72.1 kg)     Lab Results  Component Value Date   WBC 9.3 05/29/2019   HGB 13.9 05/29/2019   HCT 41.8 05/29/2019   PLT 377.0 05/29/2019   GLUCOSE 91 05/29/2019   CHOL 277 (H) 05/29/2019   TRIG 227.0 (H) 05/29/2019   HDL 42.20 05/29/2019   LDLDIRECT 204.0 05/29/2019   LDLCALC 144 (H) 05/24/2019   ALT 41 05/29/2019   AST 36 05/29/2019   NA 136 05/29/2019   K 4.6 05/29/2019   CL 100 05/29/2019   CREATININE 1.13 05/29/2019   BUN 20 05/29/2019   CO2 28 05/29/2019   TSH 1.60 02/16/2019   PSA 0.85 02/16/2019   INR 1.1 05/23/2019   HGBA1C 6.0 (H) 05/23/2019    Ct Angio Head W Or Wo Contrast  Addendum Date: 05/23/2019   ADDENDUM REPORT: 05/23/2019 15:13 ADDENDUM: Study discussed by telephone with Dr. Rosalin Hawking on 05/23/2019 at 1509 hours. We agree that visually the proximal Left ICA stenosis appears high-grade, and therefore may be numerically underestimated at 60%. Electronically Signed   By: Genevie Ann M.D.   On: 05/23/2019 15:13   Result Date: 05/23/2019 CLINICAL DATA:  58 year old male code stroke presentation. EXAM: CT  ANGIOGRAPHY HEAD AND NECK TECHNIQUE: Multidetector CT imaging of the head and neck was performed using the standard protocol during bolus administration of intravenous contrast. Multiplanar CT image reconstructions and MIPs were obtained to evaluate the vascular anatomy. Carotid stenosis measurements (when applicable) are obtained utilizing NASCET criteria, using the distal internal carotid diameter as the denominator. CONTRAST:  56mL OMNIPAQUE IOHEXOL 350 MG/ML SOLN COMPARISON:  Plain head CT 1430 hours today. FINDINGS: CTA NECK Skeleton: No acute osseous abnormality identified. Upper chest: Negative. Other neck: No acute findings. Aortic arch: Moderate for age Calcified aortic atherosclerosis. Bovine type arch configuration. Right carotid system: No brachiocephalic artery or right CCA origin stenosis despite plaque. Plaque in the right CCA proximal to the bifurcation including abundant soft plaque medially at the level of the hypopharynx on series 5, image 113, but no significant stenosis. Additional calcified plaque at the right ICA origin and bulb but no significant stenosis. Left carotid system: Bovine type left CCA origin with about 50% stenosis due to calcified plaque (series 5, image 154). Intermittent soft plaque in the left CCA without stenosis proximal to the bifurcation. At the bifurcation there is bulky calcified plaque affecting the posterior left ICA origin and bulb. Stenosis is numerically estimated at 60 % with respect to the distal vessel on series 9, image 137. Patent left ICA to the skull base. Vertebral arteries: No significant proximal right subclavian artery stenosis despite soft and calcified plaque. Similar plaque at the right vertebral artery origin and V1 segment with moderate stenosis (series 7, images 282 through 273). The right vertebral is dominant and patent to the skull base without additional stenosis, despite some V3  segment calcified plaque. No proximal left subclavian artery  stenosis despite plaque. Diminutive left vertebral artery with a normal origin on series 7, image 282. The left vertebral remains diminutive but patent to the skull base. CTA HEAD Posterior circulation: Dominant right vertebral artery with mild V4 segment calcified plaque and no stenosis. The right vertebral supplies the basilar. Patent right PICA origin. Diminutive left vertebral artery which appears to functionally terminates in the PICA. Patent but diminutive and mildly irregular basilar artery, no focal basilar stenosis. Patent SCA and left PCA origin. Fetal right PCA origin. Bilateral PCA branches are within normal limits. Anterior circulation: Both ICA siphons are patent. On the left there is moderate calcified plaque with only mild stenosis. On the right there is mild to moderate calcified plaque and similar mild supraclinoid stenosis. Normal right ophthalmic and posterior communicating artery origins. Patent carotid termini, MCA and ACA origins. Anterior communicating artery and bilateral ACA branches are within normal limits. Left MCA M1 segment and early bifurcation are patent without stenosis. Left MCA branches appear patent with mild irregularity. Right MCA M1 and bifurcation are patent without stenosis. Right MCA branches are patent with similar mild irregularity. Venous sinuses: Early contrast phase, not well evaluated. Anatomic variants: Dominant right vertebral artery which supplies the basilar. Diminutive left vertebral functionally terminates in PICA. Fetal right PCA origin. Review of the MIP images confirms the above findings IMPRESSION: 1. Negative for large vessel occlusion. 2. Positive for abundant atherosclerotic plaque in the proximal great vessels, at the carotid bifurcations, and V1 segment of the dominant right vertebral artery. Proximal left ICA stenosis estimated at 60%. Moderate proximal right vertebral artery stenosis. 3. Intracranial atherosclerosis including bilateral calcified ICA  siphons but no significant intracranial stenosis. 4. Aortic Atherosclerosis (ICD10-I70.0). These results were communicated to Dr. Erlinda Hong At 3:06 pm on 05/23/2019 by text page via the [facility] messaging system. Electronically Signed: By: Genevie Ann M.D. On: 05/23/2019 15:06   Ct Angio Neck W Or Wo Contrast  Addendum Date: 05/23/2019   ADDENDUM REPORT: 05/23/2019 15:13 ADDENDUM: Study discussed by telephone with Dr. Rosalin Hawking on 05/23/2019 at 1509 hours. We agree that visually the proximal Left ICA stenosis appears high-grade, and therefore may be numerically underestimated at 60%. Electronically Signed   By: Genevie Ann M.D.   On: 05/23/2019 15:13   Result Date: 05/23/2019 CLINICAL DATA:  58 year old male code stroke presentation. EXAM: CT ANGIOGRAPHY HEAD AND NECK TECHNIQUE: Multidetector CT imaging of the head and neck was performed using the standard protocol during bolus administration of intravenous contrast. Multiplanar CT image reconstructions and MIPs were obtained to evaluate the vascular anatomy. Carotid stenosis measurements (when applicable) are obtained utilizing NASCET criteria, using the distal internal carotid diameter as the denominator. CONTRAST:  4mL OMNIPAQUE IOHEXOL 350 MG/ML SOLN COMPARISON:  Plain head CT 1430 hours today. FINDINGS: CTA NECK Skeleton: No acute osseous abnormality identified. Upper chest: Negative. Other neck: No acute findings. Aortic arch: Moderate for age Calcified aortic atherosclerosis. Bovine type arch configuration. Right carotid system: No brachiocephalic artery or right CCA origin stenosis despite plaque. Plaque in the right CCA proximal to the bifurcation including abundant soft plaque medially at the level of the hypopharynx on series 5, image 113, but no significant stenosis. Additional calcified plaque at the right ICA origin and bulb but no significant stenosis. Left carotid system: Bovine type left CCA origin with about 50% stenosis due to calcified plaque (series 5,  image 154). Intermittent soft plaque in the left CCA without stenosis  proximal to the bifurcation. At the bifurcation there is bulky calcified plaque affecting the posterior left ICA origin and bulb. Stenosis is numerically estimated at 60 % with respect to the distal vessel on series 9, image 137. Patent left ICA to the skull base. Vertebral arteries: No significant proximal right subclavian artery stenosis despite soft and calcified plaque. Similar plaque at the right vertebral artery origin and V1 segment with moderate stenosis (series 7, images 282 through 273). The right vertebral is dominant and patent to the skull base without additional stenosis, despite some V3 segment calcified plaque. No proximal left subclavian artery stenosis despite plaque. Diminutive left vertebral artery with a normal origin on series 7, image 282. The left vertebral remains diminutive but patent to the skull base. CTA HEAD Posterior circulation: Dominant right vertebral artery with mild V4 segment calcified plaque and no stenosis. The right vertebral supplies the basilar. Patent right PICA origin. Diminutive left vertebral artery which appears to functionally terminates in the PICA. Patent but diminutive and mildly irregular basilar artery, no focal basilar stenosis. Patent SCA and left PCA origin. Fetal right PCA origin. Bilateral PCA branches are within normal limits. Anterior circulation: Both ICA siphons are patent. On the left there is moderate calcified plaque with only mild stenosis. On the right there is mild to moderate calcified plaque and similar mild supraclinoid stenosis. Normal right ophthalmic and posterior communicating artery origins. Patent carotid termini, MCA and ACA origins. Anterior communicating artery and bilateral ACA branches are within normal limits. Left MCA M1 segment and early bifurcation are patent without stenosis. Left MCA branches appear patent with mild irregularity. Right MCA M1 and bifurcation  are patent without stenosis. Right MCA branches are patent with similar mild irregularity. Venous sinuses: Early contrast phase, not well evaluated. Anatomic variants: Dominant right vertebral artery which supplies the basilar. Diminutive left vertebral functionally terminates in PICA. Fetal right PCA origin. Review of the MIP images confirms the above findings IMPRESSION: 1. Negative for large vessel occlusion. 2. Positive for abundant atherosclerotic plaque in the proximal great vessels, at the carotid bifurcations, and V1 segment of the dominant right vertebral artery. Proximal left ICA stenosis estimated at 60%. Moderate proximal right vertebral artery stenosis. 3. Intracranial atherosclerosis including bilateral calcified ICA siphons but no significant intracranial stenosis. 4. Aortic Atherosclerosis (ICD10-I70.0). These results were communicated to Dr. Erlinda Hong At 3:06 pm on 05/23/2019 by text page via the [facility] messaging system. Electronically Signed: By: Genevie Ann M.D. On: 05/23/2019 15:06   Mr Brain Wo Contrast  Result Date: 05/23/2019 CLINICAL DATA:  Encephalopathy. History metastatic squamous cell carcinoma of the tonsils. Dizziness with vomiting and confusion. EXAM: MRI HEAD WITHOUT CONTRAST TECHNIQUE: Multiplanar, multiecho pulse sequences of the brain and surrounding structures were obtained without intravenous contrast. COMPARISON:  Brain MRI 07/09/2012 FINDINGS: BRAIN: There is no acute infarct, acute hemorrhage or extra-axial collection. Encephalomalacia the right temporal lobe is new since the prior study. White matter signal is normal. The cerebral and cerebellar volume are age-appropriate. There is no hydrocephalus. The midline structures are normal. VASCULAR: The major intracranial arterial and venous sinus flow voids are normal. Susceptibility-sensitive sequences show no chronic microhemorrhage or superficial siderosis. SKULL AND UPPER CERVICAL SPINE: Calvarial bone marrow signal is normal. There is  no skull base mass. The visualized upper cervical spine and soft tissues are normal. SINUSES/ORBITS: There are no fluid levels or advanced mucosal thickening. The mastoid air cells and middle ear cavities are free of fluid. The orbits are normal. IMPRESSION: 1.  No acute intracranial abnormality. 2. Encephalomalacia of the right temporal lobe is new since 2013 and likely a sequela of a remote ischemic stroke. Electronically Signed   By: Ulyses Jarred M.D.   On: 05/23/2019 19:02   Dg Chest Portable 1 View  Result Date: 05/23/2019 CLINICAL DATA:  Altered mental status. EXAM: PORTABLE CHEST 1 VIEW COMPARISON:  02/25/2006 FINDINGS: Cardiac silhouette is normal in size. No mediastinal or hilar masses. No evidence of adenopathy. Clear lungs.  No pleural effusion or pneumothorax. Skeletal structures are grossly intact. IMPRESSION: No active disease. Electronically Signed   By: Lajean Manes M.D.   On: 05/23/2019 15:27   Ct Head Code Stroke Wo Contrast  Result Date: 05/23/2019 CLINICAL DATA:  Code stroke. 58 year old male with headache and difficulty following commands. Last seen normal 1300 hours. EXAM: CT HEAD WITHOUT CONTRAST TECHNIQUE: Contiguous axial images were obtained from the base of the skull through the vertex without intravenous contrast. COMPARISON:  Brain MRI 07/09/2012 [facility], head CT 04/06/2011. FINDINGS: Brain: Chronic appearing encephalomalacia along the lateral right temporal lobe, at the confluence with the inferior right parietal and anterior occipital lobes. Evidence of laminar necrosis. Small chronic appearing infarct in the left cerebellum on series 3, image 11. Elsewhere normal gray-white matter differentiation. No midline shift, ventriculomegaly, mass effect, evidence of mass lesion, intracranial hemorrhage or evidence of cortically based acute infarction. Vascular: Calcified atherosclerosis at the skull base. No suspicious intracranial vascular hyperdensity.  Skull: No acute osseous abnormality identified. Sinuses/Orbits: Chronic left posterior ethmoid mucosal thickening, otherwise well pneumatized paranasal sinuses and mastoids. Other: Visualized orbits and scalp soft tissues are within normal limits. ASPECTS [facility] Stroke Program Early CT Score) Total score (0-10 with 10 being normal): 10 (chronic encephalomalacia in the right hemisphere). IMPRESSION: 1. No acute cortically based infarct or acute intracranial hemorrhage identified. 2. New since 2012 but Chronic appearing infarcts of the lateral right temporal lobe (moderate size) and the left cerebellum (small). 3.  ASPECTS 10. 4. These results were communicated to Dr. Erlinda Hong at 2:34 pm on 05/23/2019 by text page via the [facility] messaging system. Electronically Signed   By: Genevie Ann M.D.   On: 05/23/2019 14:35   Vas US Carotid (at [place] Only)  Result Date: 05/24/2019 Carotid Arterial Duplex Study Indications:       TIA, Speech disturbance and Visual disturbance. Risk Factors:      Hypertension, hyperlipidemia. Comparison Study:  Carotid artery duplex 02/15/2012. Performing Technologist: Maudry Mayhew MHA, RDMS, RVT, RDCS  Examination Guidelines: A complete evaluation includes B-mode imaging, spectral Doppler, color Doppler, and power Doppler as needed of all accessible portions of each vessel. Bilateral testing is considered an integral part of a complete examination. Limited examinations for reoccurring indications may be performed as noted.  Right Carotid Findings: +----------+--------+-------+--------+--------------------------------+--------+             PSV cm/s EDV     Stenosis Plaque Description               Comments                       cm/s                                                        +----------+--------+-------+--------+--------------------------------+--------+  CCA  Prox   122      28               smooth and homogeneous                      +----------+--------+-------+--------+--------------------------------+--------+  CCA Distal 96       29                                                          +----------+--------+-------+--------+--------------------------------+--------+  ICA Prox   123      41      40-59%   smooth, heterogenous and                                                         calcific                                   +----------+--------+-------+--------+--------------------------------+--------+  ICA Distal 90       31                                                          +----------+--------+-------+--------+--------------------------------+--------+  ECA        91       15               smooth, heterogenous and                                                         calcific                                   +----------+--------+-------+--------+--------------------------------+--------+ +----------+--------+-------+----------------+-------------------+             PSV cm/s EDV cms Describe         Arm Pressure (mmHG)  +----------+--------+-------+----------------+-------------------+  Subclavian 150              Multiphasic, WNL                      +----------+--------+-------+----------------+-------------------+ +---------+--------+--+--------+--+---------+  Vertebral PSV cm/s 85 EDV cm/s 25 Antegrade  +---------+--------+--+--------+--+---------+  Left Carotid Findings: +----------+--------+-------+--------+--------------------------------+--------+             PSV cm/s EDV     Stenosis Plaque Description               Comments                       cm/s                                                        +----------+--------+-------+--------+--------------------------------+--------+  CCA Prox   139      39               smooth and heterogenous                    +----------+--------+-------+--------+--------------------------------+--------+  CCA Distal 135      42               smooth, heterogenous and                                                          calcific                                   +----------+--------+-------+--------+--------------------------------+--------+  ICA Prox   161      50      40-59%   smooth, heterogenous and                                                         calcific                                   +----------+--------+-------+--------+--------------------------------+--------+  ICA Distal 74       31                                                          +----------+--------+-------+--------+--------------------------------+--------+  ECA        178      33               calcific                                   +----------+--------+-------+--------+--------------------------------+--------+ +----------+--------+--------+----------------+-------------------+             PSV cm/s EDV cm/s Describe         Arm Pressure (mmHG)  +----------+--------+--------+----------------+-------------------+  Subclavian 161               Multiphasic, WNL                      +----------+--------+--------+----------------+-------------------+ +---------+--------+--+--------+-+---------+  Vertebral PSV cm/s 31 EDV cm/s 9 Antegrade  +---------+--------+--+--------+-+---------+  Summary: Right Carotid: Velocities in the right ICA are consistent with a low range                40-59% stenosis. Left Carotid: Velocities in the left ICA are consistent with a 40-59% stenosis. Vertebrals:  Bilateral vertebral arteries demonstrate antegrade flow. Subclavians: Normal flow hemodynamics were seen in bilateral subclavian              arteries. *See table(s) above for measurements and observations.  Electronically signed by Antony Contras MD on 05/24/2019 at 3:32:04 PM.  Final        Assessment & Plan:   Problem List Items Addressed This Visit    Anemia    Recheck cbc.        Aortic occlusion (HCC)    S/p stent placement.  Followed by vascular surgery.        Dizziness    Recently admitted with  feeling wobbly and dizzy as outlined. EEG as outlined.  Diagnosed with seizures.  Loaded with keppra.  Recommended f/u with neurology.  Reports since being discharged he has had no seizures.  Does feel dizzy.  Stopped taking keppra yesterday.  Was questioning if causing the dizziness.  Instructed to restart.  F/u with neurology.  Carotid ultrasound - 40-59% right and left carotid.  Continue pravastatin.       Relevant Orders   Ambulatory referral to Neurology   Essential hypertension, benign    Blood pressure as outlined.  Continue current medication regimen.  Follow pressures.  Follow metabolic panel.       GERD (gastroesophageal reflux disease)    Controlled on omeprazole.        History of malignant neoplasm of oropharynx    S/p chemo and XRT.  Followed by oncology.        Hypercholesterolemia    Intolerant to lipitor and crestor.  Tolerating pravastatin.  Check lipid panel.  May need to increase pravastatin.        Seizure disorder [facility])    Recently admitted and diagnosed with seizures.  On keppra.  Pt stopped taking yesterday as outlined.  Was concerned keppra causing dizziness.  Instructed to restart.  Keep f/u with neurology. No driving.        Relevant Orders   Ambulatory referral to Neurology       Einar Pheasant, MD

## 2019-06-03 ENCOUNTER — Encounter: Payer: Self-pay | Admitting: Internal Medicine

## 2019-06-04 ENCOUNTER — Encounter: Payer: Self-pay | Admitting: Internal Medicine

## 2019-06-04 DIAGNOSIS — G40909 Epilepsy, unspecified, not intractable, without status epilepticus: Secondary | ICD-10-CM | POA: Insufficient documentation

## 2019-06-04 NOTE — Assessment & Plan Note (Signed)
S/p stent placement.  Followed by vascular surgery.  

## 2019-06-04 NOTE — Assessment & Plan Note (Signed)
Controlled on omeprazole.   

## 2019-06-04 NOTE — Assessment & Plan Note (Signed)
Intolerant to lipitor and crestor.  Tolerating pravastatin.  Check lipid panel.  May need to increase pravastatin.

## 2019-06-04 NOTE — Assessment & Plan Note (Signed)
S/p chemo and XRT.  Followed by oncology.  

## 2019-06-04 NOTE — Assessment & Plan Note (Signed)
Recently admitted and diagnosed with seizures.  On keppra.  Pt stopped taking yesterday as outlined.  Was concerned keppra causing dizziness.  Instructed to restart.  Keep f/u with neurology. No driving.

## 2019-06-04 NOTE — Assessment & Plan Note (Signed)
Recheck cbc.  

## 2019-06-04 NOTE — Assessment & Plan Note (Signed)
Recently admitted with feeling wobbly and dizzy as outlined. EEG as outlined.  Diagnosed with seizures.  Loaded with keppra.  Recommended f/u with neurology.  Reports since being discharged he has had no seizures.  Does feel dizzy.  Stopped taking keppra yesterday.  Was questioning if causing the dizziness.  Instructed to restart.  F/u with neurology.  Carotid ultrasound - 40-59% right and left carotid.  Continue pravastatin.

## 2019-06-04 NOTE — Assessment & Plan Note (Signed)
Blood pressure as outlined.  Continue current medication regimen.  Follow pressures.  Follow metabolic panel.  

## 2019-06-12 DIAGNOSIS — Z85819 Personal history of malignant neoplasm of unspecified site of lip, oral cavity, and pharynx: Secondary | ICD-10-CM | POA: Diagnosis not present

## 2019-06-12 DIAGNOSIS — R569 Unspecified convulsions: Secondary | ICD-10-CM | POA: Diagnosis not present

## 2019-06-12 DIAGNOSIS — Z8673 Personal history of transient ischemic attack (TIA), and cerebral infarction without residual deficits: Secondary | ICD-10-CM | POA: Diagnosis not present

## 2019-06-12 DIAGNOSIS — I6523 Occlusion and stenosis of bilateral carotid arteries: Secondary | ICD-10-CM | POA: Diagnosis not present

## 2019-06-21 DIAGNOSIS — L821 Other seborrheic keratosis: Secondary | ICD-10-CM | POA: Diagnosis not present

## 2019-06-21 DIAGNOSIS — Z1283 Encounter for screening for malignant neoplasm of skin: Secondary | ICD-10-CM | POA: Diagnosis not present

## 2019-06-21 DIAGNOSIS — Z85828 Personal history of other malignant neoplasm of skin: Secondary | ICD-10-CM | POA: Diagnosis not present

## 2019-06-22 ENCOUNTER — Telehealth: Payer: Self-pay | Admitting: *Deleted

## 2019-06-22 ENCOUNTER — Other Ambulatory Visit: Payer: Self-pay

## 2019-06-22 NOTE — Telephone Encounter (Signed)
Please place future orders for lab appt.  

## 2019-06-23 NOTE — Telephone Encounter (Signed)
As long as the appt is cancelled after you speak with him you don't have to notify me. Thanks

## 2019-06-23 NOTE — Telephone Encounter (Signed)
appt cancelled

## 2019-06-23 NOTE — Telephone Encounter (Signed)
Please call and notify pt that we have checked his cholesterol recently.  Does not need fasting lab on 06/26/19.  Also, then please notify [place].

## 2019-06-26 ENCOUNTER — Other Ambulatory Visit: Payer: Medicare Other

## 2019-07-04 DIAGNOSIS — R569 Unspecified convulsions: Secondary | ICD-10-CM | POA: Diagnosis not present

## 2019-07-06 DIAGNOSIS — I70212 Atherosclerosis of native arteries of extremities with intermittent claudication, left leg: Secondary | ICD-10-CM | POA: Diagnosis not present

## 2019-07-06 DIAGNOSIS — I739 Peripheral vascular disease, unspecified: Secondary | ICD-10-CM | POA: Diagnosis not present

## 2019-07-06 DIAGNOSIS — R569 Unspecified convulsions: Secondary | ICD-10-CM | POA: Diagnosis not present

## 2019-07-06 DIAGNOSIS — Z923 Personal history of irradiation: Secondary | ICD-10-CM | POA: Diagnosis not present

## 2019-07-06 DIAGNOSIS — Z9862 Peripheral vascular angioplasty status: Secondary | ICD-10-CM | POA: Diagnosis not present

## 2019-07-17 ENCOUNTER — Other Ambulatory Visit: Payer: Self-pay | Admitting: Unknown Physician Specialty

## 2019-07-17 DIAGNOSIS — R221 Localized swelling, mass and lump, neck: Secondary | ICD-10-CM | POA: Diagnosis not present

## 2019-07-17 DIAGNOSIS — Z85818 Personal history of malignant neoplasm of other sites of lip, oral cavity, and pharynx: Secondary | ICD-10-CM | POA: Diagnosis not present

## 2019-07-18 ENCOUNTER — Other Ambulatory Visit: Payer: Self-pay

## 2019-07-18 MED ORDER — OMEPRAZOLE 20 MG PO CPDR: 20.0000 mg | DELAYED_RELEASE_CAPSULE | Freq: Two times a day (BID) | ORAL | 2 refills | Status: DC

## 2019-07-24 ENCOUNTER — Telehealth: Payer: Self-pay | Admitting: *Deleted

## 2019-07-24 NOTE — Telephone Encounter (Signed)
Delilah Shan called back to say that pt has orophaynx cancer and has not had pet scan since 2018. ENT office wanted to have pet scan to see if there is recurrence or metastatic disease.  I spoke to Dr. Janese Banks and she states she has not seen pt. In a long time and would need to see him to eval. His status and decide on pet scan. Delilah Shan will call pt. She will let me know. Per. Dr. Janese Banks if pt wanted we could do video appt. Will wait to hear back from [place].

## 2019-07-24 NOTE — Telephone Encounter (Signed)
[name]- can you please find out why ENT wants a PET? Also this patient was seeing me in [place]. I last saw him in 2018. Does he plan to follow up with med onc again and if so with me here or Dr. Mike Gip in [place]? We will need to see him before we can order PET

## 2019-07-24 NOTE — Telephone Encounter (Signed)
Dr Ileene Hutchinson office called stating that they were unable to get authorization for PET Scan for this patient and is asking if Dr Janese Banks can order it and get it approved. Please advise

## 2019-07-24 NOTE — Telephone Encounter (Signed)
Called Kendall back from [facility]-Dr. McQueen's office and had to leave a voicemail to call me back in reference to the PET Scan that they are asking Dr. Janese Banks to order. We need to know why they are needing it.

## 2019-07-31 NOTE — Telephone Encounter (Signed)
Pt was made an appt for 12/18 to [facility] for a pet scan. syv

## 2019-08-03 ENCOUNTER — Ambulatory Visit: Payer: Medicare Other

## 2019-08-04 ENCOUNTER — Encounter: Payer: Self-pay | Admitting: Oncology

## 2019-08-04 ENCOUNTER — Inpatient Hospital Stay: Payer: Medicare Other | Attending: Oncology | Admitting: Oncology

## 2019-08-04 ENCOUNTER — Other Ambulatory Visit: Payer: Self-pay

## 2019-08-04 VITALS — BP 150/94 | HR 96 | Temp 98.5°F | Resp 16 | Wt 160.7 lb

## 2019-08-04 DIAGNOSIS — Z85819 Personal history of malignant neoplasm of unspecified site of lip, oral cavity, and pharynx: Secondary | ICD-10-CM | POA: Diagnosis not present

## 2019-08-04 DIAGNOSIS — E7800 Pure hypercholesterolemia, unspecified: Secondary | ICD-10-CM | POA: Diagnosis not present

## 2019-08-04 DIAGNOSIS — Z7901 Long term (current) use of anticoagulants: Secondary | ICD-10-CM | POA: Insufficient documentation

## 2019-08-04 DIAGNOSIS — Z923 Personal history of irradiation: Secondary | ICD-10-CM | POA: Insufficient documentation

## 2019-08-04 DIAGNOSIS — I10 Essential (primary) hypertension: Secondary | ICD-10-CM | POA: Insufficient documentation

## 2019-08-04 DIAGNOSIS — G629 Polyneuropathy, unspecified: Secondary | ICD-10-CM | POA: Insufficient documentation

## 2019-08-04 DIAGNOSIS — Z8589 Personal history of malignant neoplasm of other organs and systems: Secondary | ICD-10-CM | POA: Diagnosis not present

## 2019-08-04 DIAGNOSIS — Z9221 Personal history of antineoplastic chemotherapy: Secondary | ICD-10-CM | POA: Diagnosis not present

## 2019-08-04 DIAGNOSIS — K219 Gastro-esophageal reflux disease without esophagitis: Secondary | ICD-10-CM | POA: Insufficient documentation

## 2019-08-04 DIAGNOSIS — Z79899 Other long term (current) drug therapy: Secondary | ICD-10-CM | POA: Insufficient documentation

## 2019-08-04 DIAGNOSIS — Z87891 Personal history of nicotine dependence: Secondary | ICD-10-CM | POA: Insufficient documentation

## 2019-08-04 DIAGNOSIS — Z08 Encounter for follow-up examination after completed treatment for malignant neoplasm: Secondary | ICD-10-CM

## 2019-08-07 ENCOUNTER — Other Ambulatory Visit: Payer: Self-pay

## 2019-08-10 ENCOUNTER — Other Ambulatory Visit: Payer: Self-pay | Admitting: Internal Medicine

## 2019-08-11 NOTE — Progress Notes (Signed)
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 He only received 2 weekly doses of cisplatin back then following which he developed limb threatenign acute aortic thrombosis and required intervention by vascular surgery. RT was cut short by 1 week back then. PET showed NED in October 2018 after treatment.   Interval history- he still has residual peripheral neuropathy in his feet. No further episodes of thrombosis. He continues to follow up with vascular surgery. For his head and neck cancer he follows up with DR. Con Memos for fiber optic exams  ECOG PS- 1 Pain scale- 0   Review of systems- Review of Systems  Constitutional: Positive for malaise/fatigue. Negative for chills, fever and weight loss.  HENT: Negative for congestion, ear discharge and nosebleeds.   Eyes: Negative for blurred vision.  Respiratory: Negative for cough, hemoptysis, sputum production, shortness of breath and wheezing.   Cardiovascular: Negative for chest pain, palpitations, orthopnea and claudication.  Gastrointestinal: Negative for abdominal pain, blood in stool, constipation, diarrhea, heartburn, melena, nausea and vomiting.  Genitourinary: Negative for dysuria, flank pain, frequency, hematuria and urgency.  Musculoskeletal: Negative for back pain, joint pain and myalgias.  Skin: Negative for rash.  Neurological:  Positive for sensory change (peripheral neuropathy). Negative for dizziness, tingling, focal weakness, seizures, weakness and headaches.  Endo/Heme/Allergies: Does not bruise/bleed easily.  Psychiatric/Behavioral: Negative for depression and suicidal ideas. The patient does not have insomnia.        No Known Allergies   Past Medical History:  Diagnosis Date  . Basal cell carcinoma (BCC) of back   . Basal cell carcinoma (BCC) of nasal sidewall   . Dizziness   . GERD (gastroesophageal reflux disease)   . H/O blood clots   . Hypercholesterolemia   . Hypertension    hx of HBP readings  . Metastatic squamous cell carcinoma ([place]) 2018   bx cervical lymph node  . Vascular abnormality   . VTE (venous thromboembolism) 2018   associated with chemotherapy     Past Surgical History:  Procedure Laterality Date  . COLONOSCOPY    . ESOPHAGOGASTRODUODENOSCOPY (EGD) WITH PROPOFOL N/A 05/24/2015   Procedure: ESOPHAGOGASTRODUODENOSCOPY (EGD) WITH PROPOFOL;  Surgeon: Manya Silvas, MD;  Location: [facility] ENDOSCOPY;  Service: Endoscopy;  Laterality: N/A;  . IR IMAGING GUIDED PORT INSERTION  12/09/2016  . SAVORY DILATION N/A 05/24/2015   Procedure: SAVORY DILATION;  Surgeon: Manya Silvas, MD;  Location: [facility] ENDOSCOPY;  Service: Endoscopy;  Laterality: N/A;    Social History   Socioeconomic History  . Marital status: Married    Spouse name: Not on file  . Number of children: Not on file  . Years of education: Not on file  . Highest education level: Not on file  Occupational History  . Occupation: Dealer  Tobacco Use  . Smoking status: Former Smoker    Quit date: 11/11/1996    Years since quitting: 22.7  . Smokeless tobacco: Never  Used  Substance and Sexual Activity  . Alcohol use: No    Alcohol/week: 0.0 standard drinks    Comment: occasional  . Drug use: No  . Sexual activity: Yes  Other Topics Concern  . Not on file  Social History Narrative  . Not on file   Social  Determinants of Health   Financial Resource Strain:   . Difficulty of Paying Living Expenses: Not on file  Food Insecurity:   . Worried About Charity fundraiser in the Last Year: Not on file  . Ran Out of Food in the Last Year: Not on file  Transportation Needs:   . Lack of Transportation (Medical): Not on file  . Lack of Transportation (Non-Medical): Not on file  Physical Activity:   . Days of Exercise per Week: Not on file  . Minutes of Exercise per Session: Not on file  Stress:   . Feeling of Stress : Not on file  Social Connections:   . Frequency of Communication with Friends and Family: Not on file  . Frequency of Social Gatherings with Friends and Family: Not on file  . Attends Religious Services: Not on file  . Active Member of Clubs or Organizations: Not on file  . Attends Archivist Meetings: Not on file  . Marital Status: Not on file  Intimate Partner Violence:   . Fear of Current or Ex-Partner: Not on file  . Emotionally Abused: Not on file  . Physically Abused: Not on file  . Sexually Abused: Not on file    Family History  Problem Relation Age of Onset  . Hyperlipidemia Mother   . Heart disease Mother   . Hypertension Mother   . Alzheimer's disease Mother   . Prostate cancer Father   . Stroke Father      Current Outpatient Medications:  .  apixaban (ELIQUIS) 5 MG TABS tablet, Take 1 tablet (5 mg total) by mouth every 12 (twelve) hours., Disp: 180 tablet, Rfl: 2 .  DENTA 5000 PLUS 1.1 % CREA dental cream, Place 1 g onto teeth daily., Disp: , Rfl:  .  levETIRAcetam (KEPPRA) 500 MG tablet, Take 1 tablet (500 mg total) by mouth 2 (two) times daily., Disp: 60 tablet, Rfl: 1 .  lisinopril (ZESTRIL) 20 MG tablet, TAKE 1 TABLET BY MOUTH EVERY DAY, Disp: 30 tablet, Rfl: 11 .  Multiple Vitamin (MULTIVITAMIN) capsule, Take by mouth., Disp: , Rfl:  .  nystatin (MYCOSTATIN) 100000 UNIT/ML suspension, 9mls - swish and spit tid, Disp: 60 mL, Rfl: 0 .  omeprazole  (PRILOSEC) 20 MG capsule, Take 1 capsule (20 mg total) by mouth 2 (two) times daily., Disp: 180 capsule, Rfl: 2 .  pravastatin (PRAVACHOL) 40 MG tablet, Take 1 tablet (40 mg total) by mouth daily., Disp: 30 tablet, Rfl: 0 .  pravastatin (PRAVACHOL) 10 MG tablet, TAKE 1 TABLET BY MOUTH DAILY, Disp: 90 tablet, Rfl: 0  Physical exam:  Vitals:   08/04/19 1402  BP: (!) 150/94  Pulse: 96  Resp: 16  Temp: 98.5 F (36.9 C)  TempSrc: Tympanic  SpO2: 98%  Weight: 160 lb 11.2 oz (72.9 kg)   Physical Exam Constitutional:      General: He is not in acute distress. HENT:     Head: Normocephalic and atraumatic.  Eyes:     Pupils: Pupils are equal, round, and reactive to light.  Cardiovascular:     Rate and Rhythm: Normal rate and regular rhythm.     Heart sounds:  Normal heart sounds.  Pulmonary:     Effort: Pulmonary effort is normal.     Breath sounds: Normal breath sounds.  Abdominal:     General: Bowel sounds are normal.     Palpations: Abdomen is soft.  Musculoskeletal:     Cervical back: Normal range of motion.  Lymphadenopathy:     Comments: No palpable cervical adenopathy  Skin:    General: Skin is warm and dry.  Neurological:     Mental Status: He is alert and oriented to person, place, and time.      CMP Latest Ref Rng & Units 05/29/2019  Glucose 70 - 99 mg/dL 91  BUN 6 - 23 mg/dL 20  Creatinine 0.40 - 1.50 mg/dL 1.13  Sodium 135 - 145 mEq/L 136  Potassium 3.5 - 5.1 mEq/L 4.6  Chloride 96 - 112 mEq/L 100  CO2 19 - 32 mEq/L 28  Calcium 8.4 - 10.5 mg/dL 10.3  Total Protein 6.0 - 8.3 g/dL 7.6  Total Bilirubin 0.2 - 1.2 mg/dL 0.6  Alkaline Phos 39 - 117 U/L 59  AST 0 - 37 U/L 36  ALT 0 - 53 U/L 41   CBC Latest Ref Rng & Units 05/29/2019  WBC 4.0 - 10.5 K/uL 9.3  Hemoglobin 13.0 - 17.0 g/dL 13.9  Hematocrit 39.0 - 52.0 % 41.8  Platelets 150.0 - 400.0 K/uL 377.0     Assessment and plan- Patient is a 58 y.o. male with h/o Stage I HPV SCC oropharynx s/p concurrent  chemo/RT. This is a routine f/u   Patient has been following up with ENT regularly for NPL exams. No evidence of recurrence based on their exams. No palpable cervical adenopathy. He has not had any imaging since oct 2018. Given the inadequate chemo/RT he received in the past, we could get a PET CT to assess for possible recurrence pending insurance approval.  Patient will continue to f/u with ENT in the future and will see me on an as needed basis     Visit Diagnosis 1. Encounter for follow-up surveillance of head and neck cancer      Dr. Randa Evens, MD, MPH [facility] [id number] 08/11/2019 11:39 AM

## 2019-08-17 ENCOUNTER — Telehealth: Payer: Self-pay

## 2019-08-17 NOTE — Telephone Encounter (Signed)
Called patient to let him know that his PET Scan was not approved by his insurance. However, Dr. Janese Banks did not need for the patient to have it done. Patient was informed to let his ENT physician know if he was having any problem. Patient agreed and understood. Patient did not have any questions.

## 2019-08-18 DIAGNOSIS — R569 Unspecified convulsions: Secondary | ICD-10-CM

## 2019-08-18 HISTORY — DX: Unspecified convulsions: R56.9

## 2019-08-22 DIAGNOSIS — R569 Unspecified convulsions: Secondary | ICD-10-CM | POA: Diagnosis not present

## 2019-08-23 ENCOUNTER — Telehealth: Payer: Self-pay | Admitting: Internal Medicine

## 2019-08-23 NOTE — Telephone Encounter (Signed)
Called pt to set up doxy appt for 1/12. He had a question about a refill for pravastatin (PRAVACHOL) 10 MG tab and dosage

## 2019-08-23 NOTE — Telephone Encounter (Signed)
lmtcb

## 2019-08-24 ENCOUNTER — Other Ambulatory Visit: Payer: Self-pay

## 2019-08-24 MED ORDER — PRAVASTATIN SODIUM 20 MG PO TABS: 20.0000 mg | ORAL_TABLET | Freq: Every day | ORAL | 1 refills | Status: DC

## 2019-08-24 NOTE — Telephone Encounter (Signed)
Clarified prescription patient is taking. Confirmed taking pravastatin 20 mg q day. Rx corrected and sent in to [facility] per patient request.

## 2019-08-29 ENCOUNTER — Ambulatory Visit (INDEPENDENT_AMBULATORY_CARE_PROVIDER_SITE_OTHER): Payer: Medicare Other | Admitting: Internal Medicine

## 2019-08-29 ENCOUNTER — Encounter: Payer: Self-pay | Admitting: Internal Medicine

## 2019-08-29 ENCOUNTER — Other Ambulatory Visit: Payer: Self-pay

## 2019-08-29 DIAGNOSIS — I10 Essential (primary) hypertension: Secondary | ICD-10-CM

## 2019-08-29 DIAGNOSIS — I70219 Atherosclerosis of native arteries of extremities with intermittent claudication, unspecified extremity: Secondary | ICD-10-CM | POA: Diagnosis not present

## 2019-08-29 DIAGNOSIS — Z85819 Personal history of malignant neoplasm of unspecified site of lip, oral cavity, and pharynx: Secondary | ICD-10-CM

## 2019-08-29 DIAGNOSIS — R42 Dizziness and giddiness: Secondary | ICD-10-CM

## 2019-08-29 DIAGNOSIS — G40909 Epilepsy, unspecified, not intractable, without status epilepticus: Secondary | ICD-10-CM

## 2019-08-29 DIAGNOSIS — I7410 Embolism and thrombosis of unspecified parts of aorta: Secondary | ICD-10-CM

## 2019-08-29 DIAGNOSIS — K219 Gastro-esophageal reflux disease without esophagitis: Secondary | ICD-10-CM | POA: Diagnosis not present

## 2019-08-29 DIAGNOSIS — E7800 Pure hypercholesterolemia, unspecified: Secondary | ICD-10-CM

## 2019-08-29 NOTE — Assessment & Plan Note (Signed)
S/p fem fem bypass.  Followed by vascular surgery.  Legs do limit him, but he is riding his bike and trying to stay active.  Continue risk factor modificaiton.

## 2019-08-29 NOTE — Progress Notes (Signed)
[name], male   DOB: May 05, 1961, 59 y.o.   MRN: [id number]   Virtual Visit via video Note  This visit type was conducted due to national recommendations for restrictions regarding the COVID-19 pandemic (e.g. social distancing).  This format is felt to be most appropriate for this patient at this time.  All issues noted in this document were discussed and addressed.  No physical exam was performed (except for noted visual exam findings with Video Visits).   I connected with Timothy Garrison by a video enabled telemedicine application and verified that I am speaking with the correct person using two identifiers. Location patient: home Location provider: work Persons participating in the virtual visit: patient, provider  The limitations, risks, security and privacy concerns of performing an evaluation and management service by video and the availability of in person appointments have been discussed.  The patient expressed understanding and agreed to proceed.   Reason for visit: scheduled follow up.   HPI: Reports he is doing relatively well.  Saw Dr Janese Banks 08/04/19.  Dr Janese Banks and ENT - attempting to get f/u PET/CT.  Per report, insurance denied.  Follows with them closely.  Seeing vascular surgery.  Last evaluated 07/06/19.  Stable.  He tries to stay active.  Legs do limit activity, but he rides his bike daily.  No chest pain.  No sob.  No acid reflux.  Controlled on prilosec.  Has adjusted his diet.  Bowels moving better.  No abdominal pain reported.  Had f/u recently with neurology.  On keppra 250mg  bid.  No further seizures.  Dizziness/light headedness - better on this dose of keppra.  Blood pressure today a little elevated.  States normally 120-130/80s.  Agreeable to spot check and send in readings.     ROS: See pertinent positives and negatives per HPI.  Past Medical History:  Diagnosis Date  . Basal cell carcinoma (BCC) of back   . Basal cell carcinoma (BCC) of nasal sidewall    . Dizziness   . GERD (gastroesophageal reflux disease)   . H/O blood clots   . Hypercholesterolemia   . Hypertension    hx of HBP readings  . Metastatic squamous cell carcinoma ([place]) 2018   bx cervical lymph node  . Vascular abnormality   . VTE (venous thromboembolism) 2018   associated with chemotherapy    Past Surgical History:  Procedure Laterality Date  . COLONOSCOPY    . ESOPHAGOGASTRODUODENOSCOPY (EGD) WITH PROPOFOL N/A 05/24/2015   Procedure: ESOPHAGOGASTRODUODENOSCOPY (EGD) WITH PROPOFOL;  Surgeon: Manya Silvas, MD;  Location: [facility] ENDOSCOPY;  Service: Endoscopy;  Laterality: N/A;  . IR IMAGING GUIDED PORT INSERTION  12/09/2016  . SAVORY DILATION N/A 05/24/2015   Procedure: SAVORY DILATION;  Surgeon: Manya Silvas, MD;  Location: [facility] ENDOSCOPY;  Service: Endoscopy;  Laterality: N/A;    Family History  Problem Relation Age of Onset  . Hyperlipidemia Mother   . Heart disease Mother   . Hypertension Mother   . Alzheimer's disease Mother   . Prostate cancer Father   . Stroke Father     SOCIAL HX: reviewed.    Current Outpatient Medications:  .  apixaban (ELIQUIS) 5 MG TABS tablet, Take 1 tablet (5 mg total) by mouth every 12 (twelve) hours., Disp: 180 tablet, Rfl: 2 .  DENTA 5000 PLUS 1.1 % CREA dental cream, Place 1 g onto teeth daily., Disp: , Rfl:  .  levETIRAcetam (KEPPRA) 500 MG tablet, Take 1 tablet (500 mg total)  by mouth 2 (two) times daily., Disp: 60 tablet, Rfl: 1 .  lisinopril (ZESTRIL) 20 MG tablet, TAKE 1 TABLET BY MOUTH EVERY DAY, Disp: 30 tablet, Rfl: 11 .  Multiple Vitamin (MULTIVITAMIN) capsule, Take by mouth., Disp: , Rfl:  .  nystatin (MYCOSTATIN) 100000 UNIT/ML suspension, 53mls - swish and spit tid, Disp: 60 mL, Rfl: 0 .  omeprazole (PRILOSEC) 20 MG capsule, Take 1 capsule (20 mg total) by mouth 2 (two) times daily., Disp: 180 capsule, Rfl: 2 .  pravastatin (PRAVACHOL) 20 MG tablet, Take 1 tablet (20 mg total) by mouth daily., Disp: 90  tablet, Rfl: 1  EXAM:  VITALS per patient if applicable: [id number]  GENERAL: alert, oriented, appears well and in no acute distress  HEENT: atraumatic, conjunttiva clear, no obvious abnormalities on inspection of external nose and ears  NECK: normal movements of the head and neck  LUNGS: on inspection no signs of respiratory distress, breathing rate appears normal, no obvious gross SOB, gasping or wheezing  CV: no obvious cyanosis  PSYCH/NEURO: pleasant and cooperative, no obvious depression or anxiety, speech and thought processing grossly intact  ASSESSMENT AND PLAN:  Discussed the following assessment and plan:  Atherosclerotic PVD with intermittent claudication (HCC) S/p fem fem bypass.  Followed by vascular surgery.  Legs do limit him, but he is riding his bike and trying to stay active.  Continue risk factor modificaiton.    Dizziness Is better on lower dose of keppra.  Follow.   Essential hypertension, benign Blood pressure as outlined.  A little elevated today.  Appears to be better controlled overall - as outlined.  Have him spot check his pressure.  Send in readings.  Adjust medication if needed.    GERD (gastroesophageal reflux disease) Controlled on omeprazole.    History of malignant neoplasm of oropharynx S/p chemo and XRT.  Followed by oncology and ENT.    Hypercholesterolemia Intolerant to lipitor and crestor.  Is tolerating pravastatin.  Has adjusted diet.  Low cholesterol diet and exercise.  Check lipid panel and liver function tests.    Seizure disorder ([place]) Doing well on lower dose of keppra.  Followed by neurology.     Orders Placed This Encounter  Procedures  . Hepatic function panel    Standing Status:   Future    Standing Expiration Date:   08/28/2020  . Lipid panel    Standing Status:   Future    Standing Expiration Date:   08/28/2020  . Basic metabolic panel (future)    Standing Status:   Future    Standing Expiration Date:   08/28/2020       I discussed the assessment and treatment plan with the patient. The patient was provided an opportunity to ask questions and all were answered. The patient agreed with the plan and demonstrated an understanding of the instructions.   The patient was advised to call back or seek an in-person evaluation if the symptoms worsen or if the condition fails to improve as anticipated.   Einar Pheasant, MD

## 2019-08-29 NOTE — Assessment & Plan Note (Signed)
S/p chemo and XRT.  Followed by oncology and ENT.

## 2019-08-29 NOTE — Assessment & Plan Note (Signed)
Is better on lower dose of keppra.  Follow.

## 2019-08-29 NOTE — Assessment & Plan Note (Signed)
Controlled on omeprazole.   

## 2019-08-29 NOTE — Assessment & Plan Note (Signed)
Doing well on lower dose of keppra.  Followed by neurology.

## 2019-08-29 NOTE — Assessment & Plan Note (Signed)
Intolerant to lipitor and crestor.  Is tolerating pravastatin.  Has adjusted diet.  Low cholesterol diet and exercise.  Check lipid panel and liver function tests.

## 2019-08-29 NOTE — Assessment & Plan Note (Signed)
Blood pressure as outlined.  A little elevated today.  Appears to be better controlled overall - as outlined.  Have him spot check his pressure.  Send in readings.  Adjust medication if needed.

## 2019-08-31 ENCOUNTER — Other Ambulatory Visit (INDEPENDENT_AMBULATORY_CARE_PROVIDER_SITE_OTHER): Payer: Medicare Other

## 2019-08-31 ENCOUNTER — Other Ambulatory Visit: Payer: Self-pay

## 2019-08-31 DIAGNOSIS — E7800 Pure hypercholesterolemia, unspecified: Secondary | ICD-10-CM

## 2019-08-31 DIAGNOSIS — I10 Essential (primary) hypertension: Secondary | ICD-10-CM

## 2019-08-31 LAB — BASIC METABOLIC PANEL
BUN: 17 mg/dL (ref 6–23)
CO2: 29 mEq/L (ref 19–32)
Calcium: 9.7 mg/dL (ref 8.4–10.5)
Chloride: 100 mEq/L (ref 96–112)
Creatinine, Ser: 1.07 mg/dL (ref 0.40–1.50)
GFR: 70.7 mL/min (ref >60.00)
Glucose, Bld: 74 mg/dL (ref 70–99)
Potassium: 5.1 mEq/L (ref 3.5–5.1)
Sodium: 137 mEq/L (ref 135–145)

## 2019-08-31 LAB — LIPID PANEL
Cholesterol: 286 mg/dL — ABNORMAL HIGH (ref 0–200)
HDL: 46.4 mg/dL (ref >39.00)
LDL Cholesterol: 200 mg/dL — ABNORMAL HIGH (ref 0–99)
NonHDL: 239.75
Total CHOL/HDL Ratio: 6
Triglycerides: 199 mg/dL — ABNORMAL HIGH (ref 0.0–149.0)
VLDL: 39.8 mg/dL (ref 0.0–40.0)

## 2019-08-31 LAB — HEPATIC FUNCTION PANEL
ALT: 35 U/L (ref 0–53)
AST: 27 U/L (ref 0–37)
Albumin: 4.5 g/dL (ref 3.5–5.2)
Alkaline Phosphatase: 71 U/L (ref 39–117)
Bilirubin, Direct: 0.1 mg/dL (ref 0.0–0.3)
Total Bilirubin: 0.6 mg/dL (ref 0.2–1.2)
Total Protein: 7.2 g/dL (ref 6.0–8.3)

## 2019-10-12 ENCOUNTER — Telehealth: Payer: Self-pay | Admitting: Internal Medicine

## 2019-10-12 ENCOUNTER — Other Ambulatory Visit: Payer: Self-pay

## 2019-10-12 MED ORDER — PRAVASTATIN SODIUM 40 MG PO TABS: 40.0000 mg | ORAL_TABLET | Freq: Every day | ORAL | 1 refills | Status: DC

## 2019-10-12 NOTE — Telephone Encounter (Signed)
Pt needs refill and he states Dr Nicki Reaper wanted it changed to 40mg .

## 2019-10-12 NOTE — Telephone Encounter (Signed)
Medication has been refilled and dosage increased per Dr Nicki Reaper result note.

## 2019-10-12 NOTE — Telephone Encounter (Signed)
Oh that would help! Sorry-it's pravastatin (PRAVACHOL) 20 MG tablet

## 2019-10-12 NOTE — Telephone Encounter (Signed)
What was the medication?

## 2019-10-19 DIAGNOSIS — R2689 Other abnormalities of gait and mobility: Secondary | ICD-10-CM | POA: Diagnosis not present

## 2019-10-19 DIAGNOSIS — Z79899 Other long term (current) drug therapy: Secondary | ICD-10-CM | POA: Diagnosis not present

## 2019-10-19 DIAGNOSIS — R569 Unspecified convulsions: Secondary | ICD-10-CM | POA: Diagnosis not present

## 2019-10-19 DIAGNOSIS — I6523 Occlusion and stenosis of bilateral carotid arteries: Secondary | ICD-10-CM | POA: Diagnosis not present

## 2019-10-19 DIAGNOSIS — Z8673 Personal history of transient ischemic attack (TIA), and cerebral infarction without residual deficits: Secondary | ICD-10-CM | POA: Diagnosis not present

## 2019-10-30 ENCOUNTER — Ambulatory Visit: Payer: Medicare Other | Attending: Neurology | Admitting: Physical Therapy

## 2019-10-30 ENCOUNTER — Encounter: Payer: Self-pay | Admitting: Physical Therapy

## 2019-10-30 ENCOUNTER — Ambulatory Visit: Payer: Medicare Other

## 2019-10-30 ENCOUNTER — Other Ambulatory Visit: Payer: Self-pay

## 2019-10-30 DIAGNOSIS — R42 Dizziness and giddiness: Secondary | ICD-10-CM | POA: Diagnosis not present

## 2019-10-30 DIAGNOSIS — R2681 Unsteadiness on feet: Secondary | ICD-10-CM

## 2019-10-30 NOTE — Therapy (Signed)
[facility] [address] [place], [place], [address] Phone: [phone]   Fax:  [phone]  Physical Therapy Evaluation  Patient Details  Name: Timothy Garrison MRN: [id number] Date of Birth: February 23, 1961 Referring Provider (PT): Dr. Jennings Books   Encounter Date: 10/30/2019  PT End of Session - 10/30/19 1421    Visit Number  1    Number of Visits  9    Date for PT Re-Evaluation  12/25/19    PT Start Time  [id number]    PT Stop Time  1513    PT Time Calculation (min)  53 min    Equipment Utilized During Treatment  Gait belt    Activity Tolerance  Patient tolerated treatment well    Behavior During Therapy  [facility] for tasks assessed/performed       Past Medical History:  Diagnosis Date  . Basal cell carcinoma (BCC) of back   . Basal cell carcinoma (BCC) of nasal sidewall   . Dizziness   . GERD (gastroesophageal reflux disease)   . H/O blood clots   . Hypercholesterolemia   . Hypertension    hx of HBP readings  . Metastatic squamous cell carcinoma ([place]) 2018   bx cervical lymph node  . Vascular abnormality   . VTE (venous thromboembolism) 2018   associated with chemotherapy    Past Surgical History:  Procedure Laterality Date  . COLONOSCOPY    . ESOPHAGOGASTRODUODENOSCOPY (EGD) WITH PROPOFOL N/A 05/24/2015   Procedure: ESOPHAGOGASTRODUODENOSCOPY (EGD) WITH PROPOFOL;  Surgeon: Manya Silvas, MD;  Location: [facility] ENDOSCOPY;  Service: Endoscopy;  Laterality: N/A;  . IR IMAGING GUIDED PORT INSERTION  12/09/2016  . SAVORY DILATION N/A 05/24/2015   Procedure: SAVORY DILATION;  Surgeon: Manya Silvas, MD;  Location: [facility] ENDOSCOPY;  Service: Endoscopy;  Laterality: N/A;    There were no vitals filed for this visit.   Subjective Assessment - 10/30/19 1929    Subjective  Patient reports that he had vestibular therapy about 5-6 years ago and that it helped significantly. Patient states that he hopes he can get back to that level  again.    Pertinent History  Patient reports that he had a seizure in October 2020. Patient reports he had sensation of lights in his eyes, then was leaning and had difficulty walking, nausea and then shaking of his body. Patient reports he was taken to the hospital for care for unprovoked convulsive seizure in patient with right lateral temporal encephalomalacia per MR. Patient had CTA, MRI brain, EEG, ECHO, US carotids performed per MR. MRI reports per MR stated: Encephalomalacia of the right temporal lobe is new since 2013 and likely a sequela of a remote ischemic stroke. Per MR, CTA of the head revealed: the proximal Left ICA stenosis appears high-grade, and therefore may be numerically underestimated at 60%. Patient reports he is on [place] now and reports he has not had another seizure since October. Patient reports he had dizziness at the time of the seizure, but that he first started getting dizziness again about 6 to 8 months ago. Patient states he has tried massage therapy and chiropractic care, but states this has not improved his dizziness, but has improved his neck motion. Patient reports he was seen for dizziness by this writer about 5 to 6 years ago and states that the vestibular therapy helped. Patient reports that his dizziness was very manageable and he could work after the vestibular therapy 5-6 years ago. Patient states he still  does some of the exercises from his HEP each day. Patient describes his dizziness as imbalance and unstable feeling and denies veritgo. Patient reports he is getting dizziness daily, sometimes multiple times per day. Patient reports the dizziness lasts a minute or two. Washing his hair, looking up under a car, getting up quickly, bending over all bring on his dizziness. Patient reports sitting still makes it better. Patient reports he sees Dr. Tami Ribas every six months for throat cancer follow-ups and he has also been seen for the dizziness in the past.    Diagnostic  tests  MRI brian CTA, EEG, ECHO, US carotids    Patient Stated Goals  to have decreased dizziness         [facility] PT Assessment - 10/30/19 1516      Assessment   Medical Diagnosis  Imbalance    Referring Provider (PT)  Dr. Jennings Books    Prior Therapy  vestibular therapy greater than 5 years ago that patient reported helped at the time      Precautions   Precautions  None      Restrictions   Weight Bearing Restrictions  No      Balance Screen   Has the patient fallen in the past 6 months  No    Has the patient had a decrease in activity level because of a fear of falling?   Yes    Is the patient reluctant to leave their home because of a fear of falling?   No      Home Environment   Living Environment  Private residence    Available Help at Discharge  Family    Type of [place] to enter    Entrance Stairs-Number of Steps  2 stairs with no rail at front and about 6 steps with bilateral rails at back deck which is how patient enters home    Entrance Stairs-Rails  Right;Left;Can reach both    [place]thel  One level      Prior Function   Level of Independence  Independent with community mobility without device      Cognition   Overall Cognitive Status  Within Functional Limits for tasks assessed      Standardized Balance Assessment   Standardized Balance Assessment  Dynamic Gait Index      Dynamic Gait Index   Level Surface  Normal    Change in Gait Speed  Moderate Impairment    Gait with Horizontal Head Turns  Mild Impairment    Gait with Vertical Head Turns  Normal    Gait and Pivot Turn  Normal    Step Over Obstacle  Moderate Impairment    Step Around Obstacles  Normal    Steps  Mild Impairment    Total Score  18         VESTIBULAR AND BALANCE EVALUATION   HISTORY:  Subjective history of current problem: History obtained from medical record and patient report.  Patient reports that he had a seizure in October 2020. Patient reports he  had sensation of lights in his eyes, then was leaning and had difficulty walking, nausea and then shaking of his body. Patient reports he was taken to the hospital for care for unprovoked convulsive seizure in patient with right lateral temporal encephalomalacia per MR. Patient had CTA, MRI brain, EEG, ECHO, US carotids performed per MR. MRI reports per MR stated: Encephalomalacia of the right temporal lobe is new since  2013 and likely a sequela of a remote ischemic stroke. Per MR, CTA of the head revealed: the proximal Left ICA stenosis appears high-grade, and therefore may be numerically underestimated at 60%. Patient reports he is on [place] now and reports he has not had another seizure since October. Patient reports he had dizziness at the time of the seizure, but that he first started getting dizziness again about 6 to 8 months ago. Patient states he has tried massage therapy and chiropractic care, but states this has not improved his dizziness, but has improved his neck motion. Patient reports he was seen for dizziness by this writer about 5 to 6 years ago and states that the vestibular therapy helped. Patient reports that his dizziness was very manageable and he could work after the vestibular therapy 5-6 years ago. Patient states he still does some of the exercises from his HEP each day. Patient describes his dizziness as imbalance and unstable feeling and denies veritgo. Patient reports he is getting dizziness daily, sometimes multiple times per day. Patient reports the dizziness lasts a minute or two. Washing his hair, looking up under a car, getting up quickly, bending over all bring on his dizziness. Patient reports sitting still makes it better. Patient reports he sees Dr. Tami Ribas every six months for throat cancer follow-ups and he has also been seen for the dizziness in the past.   Symptom nature: motion provoked, positional, intermittent  Progression of symptoms: no change since onset or a little  worse since the last time he had therapy he said. History of similar episodes: yes  Falls (yes/no): no Number of falls in past 6 months: 0  Auditory complaints (tinnitus, pain, drainage): denies Vision (last eye exam, diplopia, recent changes): patient wearing reader glasses, patient reports his vision blurry when he gets dizzy.  Current Symptoms: (dysarthria, dysphagia, drop attacks, bowel and bladder changes, recent weight loss/gain)    Review of systems negative for red flags.     EXAMINATION  SOMATOSENSORY:  Patient with history of peripheral neuropathy  COORDINATION: Finger to Nose: deferred this date  MUSCULOSKELETAL SCREEN: Cervical Spine ROM: Cervical AROM WFL right/left rotation, flexion and extension   Functional Mobility:  Independent with bed mobility and sit to stand transfers.  Gait: Patient arrives ambulating without AD with mildly reduced cadence with step through gait pattern with fair scanning of visual environment.  Balance: Patient is challenged by single leg stance, ambulation with head turns, narrow base of support. Uneven surfaces to be assessed next session.   POSTURAL CONTROL TESTS:  Clinical Test of Sensory Interaction for Balance (CTSIB): TBA  OCULOMOTOR / VESTIBULAR TESTING: Oculomotor Exam- Room Light  Normal Abnormal Comments  Ocular Alignment N    Ocular ROM N    Spontaneous Nystagmus N    Gaze evoked Nystagmus N    Smooth Pursuit N  Looking up increased dizziness with smooth pursuits  Saccades  Abn Mild hypometric saccades noted all fields  VOR  Abn Blurring of target and mild dizziness  VOR Cancellation  Abn Blurring   Left Head Impulse N    Right Head Impulse N    Head Shaking Nystagmus   deferred    BPPV TESTS:  Symptoms Duration Intensity Nystagmus  Left Dix-Hallpike None  N/A None observed  Right Dix-Hallpike None  N/A None observed  Left Head Roll None  N/A None observed  Right Head Roll None  N/A None observed     FUNCTIONAL OUTCOME MEASURES:  Results Comments  DHI  66 /100 Moderate perception of handicap; in need of intervention  ABC Scale      56.3% High Falls risk; in need of intervention  DGI        18/24 Falls risk; in need of intervention  FOTO  50/100   (out of 100) Given the patient's risk adjustment variables, like-patients nationally had a FS score of 58  at intake; in need of improvement   VOR X 1 exercise:  Demonstrated and educated as to VOR X1.  Patient performed VOR X 1 horizontal in sitting 3 reps of 1 minute each with verbal cues for technique.  Patient reports the target is blurring at faster speeds and reports 1/10 dizziness.  Issued for HEP; handout provided.  PT Education - 10/30/19 1838    Education Details  discussed plan of care; issued VOR X 1 for HEP    Person(s) Educated  Patient    Methods  Explanation;Handout;Demonstration;Verbal cues    Comprehension  Verbalized understanding;Returned demonstration       PT Short Term Goals - 10/30/19 1845      PT SHORT TERM GOAL #1   Title  Patient will be able to perform home program independently for self-management.    Time  4    Period  Weeks    Status  New    Target Date  11/27/19        PT Long Term Goals - 10/30/19 1846      PT LONG TERM GOAL #1   Title  Patient will reduce falls risk as indicated by Activities Specific Balance Confidence Scale (ABC) >67%.    Baseline  scored 56.3% on 10/30/19    Time  8    Period  Weeks    Status  New    Target Date  12/25/19      PT LONG TERM GOAL #2   Title  Patient will reduce perceived disability to low levels as indicated by <40 on Dizziness Handicap Inventory.    Baseline  scored 66/100 on 10/30/19    Time  8    Period  Weeks    Status  New    Target Date  12/25/19      PT LONG TERM GOAL #3   Title  Patient will have an improvement on FOTO score of 10 points or more in order to demonstrate improvements in patient's balance and strength in order for better  functional independence with ADLs, work and hobby activities.    Baseline  scored 50/100 on FOTO    Time  8    Period  Weeks    Status  New    Target Date  12/25/19      PT LONG TERM GOAL #4   Title  Patient will demonstrate reduced falls risk as evidenced by Dynamic Gait Index (DGI) 21/24 or greater.    Time  8    Period  Weeks    Status  New    Target Date  12/25/19             Plan - 10/30/19 1839    Clinical Impression Statement  Patient reports that he had vestibular therapyabout 5-6 years ago and that it helped with his symptoms. Patient reports he began to have worsening dizziness 6-8 months ago and is having difficulty with imbalance and unsteadiness. Patient with abnormal saccades, VOR, VOR cancellation which are potential indicators of both central and peripheral issues. Patient scored 56.3% on the ABC scale and 18/24 on the  DGI indicating high falls risk and 66/100 on the [place] indicating moderate perception of handicap. Patient would benefit from PT services to address functional deficits, goals and to try to reduce patient's falls risk and subjective symptoms of dizziness/imbalance.    Personal Factors and Comorbidities  Comorbidity 3+;Past/Current Experience;Time since onset of injury/illness/exacerbation    Comorbidities  HTN, hyperlipediemia, seizure disorder, peripheral neuropathy, neck pain, CVA    Examination-Activity Limitations  Bend;Other   showering   Examination-Participation Restrictions  Other   working on cars   Stability/Clinical Decision Making  Evolving/Moderate complexity    Clinical Decision Making  Moderate    Rehab Potential  Good    PT Frequency  1x / week    PT Duration  8 weeks    PT Treatment/Interventions  [place] Repostioning;Gait training;Stair training;Therapeutic activities;Therapeutic exercise;Balance training;Neuromuscular re-education;Patient/family education;Vestibular;Manual techniques    PT Next Visit Plan  review VOR X 1 try  standing; ambulation with head turns, Airex pad with head turns, step taps    PT Home Exercise Plan  VOR X 1 in sitting    Consulted and Agree with Plan of Care  Patient       Patient will benefit from skilled therapeutic intervention in order to improve the following deficits and impairments:  Difficulty walking, Dizziness, Decreased balance, Decreased mobility  Visit Diagnosis: Dizziness and giddiness  Unsteadiness on feet     Problem List Patient Active Problem List   Diagnosis Date Noted  . Embolism and thrombosis of unspecified parts of aorta ([place]) 08/29/2019  . Seizure disorder ([place]) 06/04/2019  . Neurologic abnormality 05/23/2019  . Sepsis due to undetermined organism with metabolic encephalopathy ([place]) 05/23/2019  . Dysphagia 04/25/2019  . Anemia 04/25/2019  . Left shoulder pain 05/29/2018  . Fatigue 02/06/2018  . Atherosclerotic PVD with intermittent claudication ([place]) 12/13/2017  . History of radiation to head and neck region 12/13/2017  . Aortic occlusion ([place]) 04/26/2017  . Xerostomia 04/26/2017  . Peripheral neuropathy due to ischemia 04/12/2017  . Absent pedal pulses 01/09/2017  . Claudication ([place]) 01/09/2017  . Goals of care, counseling/discussion 12/25/2016  . History of malignant neoplasm of oropharynx 12/07/2016  . Neck mass 10/07/2016  . Chest pain 02/07/2015  . Health care maintenance 11/03/2014  . Daytime somnolence 04/15/2014  . DDD (degenerative disc disease), cervical 02/07/2014  . Myofascial pain 02/07/2014  . Basal cell carcinoma of face 10/24/2013  . Neck pain 09/16/2013  . GERD (gastroesophageal reflux disease) 01/23/2013  . Dizziness 01/23/2013  . Essential hypertension, benign 01/17/2013  . Hypercholesterolemia 01/17/2013  . History of migraine headaches 02/16/2011   Lady Deutscher PT, DPT [phone] Lady Deutscher 10/30/2019, 7:30 PM  [facility] [address] [place], [place],  [address] Phone: [phone]   Fax:  [phone]  Name: Timothy Garrison MRN: [id number] Date of Birth: 08-19-1960

## 2019-11-01 ENCOUNTER — Ambulatory Visit: Payer: Medicare Other | Admitting: Physical Therapy

## 2019-11-08 ENCOUNTER — Ambulatory Visit: Payer: Medicare Other | Admitting: Physical Therapy

## 2019-11-08 ENCOUNTER — Other Ambulatory Visit: Payer: Self-pay

## 2019-11-08 ENCOUNTER — Encounter: Payer: Self-pay | Admitting: Physical Therapy

## 2019-11-08 DIAGNOSIS — R2681 Unsteadiness on feet: Secondary | ICD-10-CM

## 2019-11-08 DIAGNOSIS — R42 Dizziness and giddiness: Secondary | ICD-10-CM | POA: Diagnosis not present

## 2019-11-08 NOTE — Therapy (Signed)
[facility] [address]. [place], [place], [address] Phone: [phone]   Fax:  [phone]  Physical Therapy Treatment  Patient Details  Name: Timothy Garrison MRN: [id number] Date of Birth: [birth year] Referring Provider (PT): Dr. Jennings Books   Encounter Date: 11/08/2019  PT End of Session - 11/08/19 1902    Visit Number  2    Number of Visits  9    Date for PT Re-Evaluation  12/25/19    PT Start Time  1350    PT Stop Time  1433    PT Time Calculation (min)  43 min    Equipment Utilized During Treatment  Gait belt    Activity Tolerance  Patient tolerated treatment well    Behavior During Therapy  [facility] for tasks assessed/performed       Past Medical History:  Diagnosis Date  . Basal cell carcinoma (BCC) of back   . Basal cell carcinoma (BCC) of nasal sidewall   . Dizziness   . GERD (gastroesophageal reflux disease)   . H/O blood clots   . Hypercholesterolemia   . Hypertension    hx of HBP readings  . Metastatic squamous cell carcinoma ([place]) 2018   bx cervical lymph node  . Vascular abnormality   . VTE (venous thromboembolism) 2018   associated with chemotherapy    Past Surgical History:  Procedure Laterality Date  . COLONOSCOPY    . ESOPHAGOGASTRODUODENOSCOPY (EGD) WITH PROPOFOL N/A 05/24/2015   Procedure: ESOPHAGOGASTRODUODENOSCOPY (EGD) WITH PROPOFOL;  Surgeon: Manya Silvas, MD;  Location: [facility] ENDOSCOPY;  Service: Endoscopy;  Laterality: N/A;  . IR IMAGING GUIDED PORT INSERTION  12/09/2016  . SAVORY DILATION N/A 05/24/2015   Procedure: SAVORY DILATION;  Surgeon: Manya Silvas, MD;  Location: [facility] ENDOSCOPY;  Service: Endoscopy;  Laterality: N/A;    There were no vitals filed for this visit.  Subjective Assessment - 11/08/19 1901    Subjective  Patient reports that he has been doing the VOR X 1 exercise at home.    Pertinent History  Patient reports that he had a seizure in October 2020. Patient reports he  had sensation of lights in his eyes, then was leaning and had difficulty walking, nausea and then shaking of his body. Patient reports he was taken to the hospital for care for unprovoked convulsive seizure in patient with right lateral temporal encephalomalacia per MR. Patient had CTA, MRI brain, EEG, ECHO, US carotids performed per MR. MRI reports per MR stated: Encephalomalacia of the right temporal lobe is new since 2013 and likely a sequela of a remote ischemic stroke. Per MR, CTA of the head revealed: the proximal Left ICA stenosis appears high-grade, and therefore may be numerically underestimated at 60%. Patient reports he is on [place] now and reports he has not had another seizure since October. Patient reports he had dizziness at the time of the seizure, but that he first started getting dizziness again about 6 to 8 months ago. Patient states he has tried massage therapy and chiropractic care, but states this has not improved his dizziness, but has improved his neck motion. Patient reports he was seen for dizziness by this writer about 5 to 6 years ago and states that the vestibular therapy helped. Patient reports that his dizziness was very manageable and he could work after the vestibular therapy 5-6 years ago. Patient states he still does some of the exercises from his HEP each day. Patient describes his dizziness as imbalance  and unstable feeling and denies veritgo. Patient reports he is getting dizziness daily, sometimes multiple times per day. Patient reports the dizziness lasts a minute or two. Washing his hair, looking up under a car, getting up quickly, bending over all bring on his dizziness. Patient reports sitting still makes it better. Patient reports he sees Dr. Tami Ribas every six months for throat cancer follow-ups and he has also been seen for the dizziness in the past.    Diagnostic tests  MRI brian CTA, EEG, ECHO, US carotids    Patient Stated Goals  to have decreased dizziness        Neuromuscular Re-education:  VOR x 1 exercise: In standing, patient performed VOR X 1 horizontal 2 reps of 1 minute and vertical 1 rep of 1 minute on firm surface with verbal cues for amount of head movement and speed. Patient reports blurring of target and doubling which improved with decreasing head speed.    Clinical Test of Sensory Interaction for Balance    (CTSIB): CONDITION TIME STRATEGY SWAY  Eyes open, firm surface 30 seconds ankle +1  Eyes closed, firm surface 30 seconds ankle +2  Eyes open, foam surface 30 seconds ankle +1  Eyes closed, foam surface 30 seconds Ankle, hip +3    Airex pad:  On firm surface and then on Airex pad, patient performed feet together progressions (progressed to feet together) and semi-tandem progressions with alternating lead leg with and without horizontal and vertical head turns with CGA. On Airex pad, performed feet together with body turns. Performed on Airex pad, feet together with eyes closed. Patient with increased sway and reached a few times on // bar to regain balance.   Body Wall Rolls:  Patient performed 5 reps of supported, body wall rolls with eyes open with CGA.  Patient reports 5/10 dizziness with this activity.   Diona Foley toss to self:  Patient performed static standing while tossing ball to self horizontal while tracking ball with head and eyes.  Performed 125' trials of forward ambulation with ball toss to self horizontal and then vertical while tracking ball with head and eyes with CGA. Patient reports dizziness with the quick turns with this activity.   Ambulation with head turns:  Patient performed 175' forwards and retro ambulation while scanning for targets in hallway with CGA. Patient had a few minor sways with retro ambulation. Patient reports min imbalance but denies dizziness with this activity.     PT Education - 11/08/19 1901    Education Details  reviewed VOR X1 and progressed to standing; added feet together and  semitandem progressions with horizontal and vertical head turns and body turns and body wall rolls    Person(s) Educated  Patient    Methods  Explanation;Demonstration;Verbal cues;Handout    Comprehension  Verbalized understanding       PT Short Term Goals - 10/30/19 1845      PT SHORT TERM GOAL #1   Title  Patient will be able to perform home program independently for self-management.    Time  4    Period  Weeks    Status  New    Target Date  11/27/19        PT Long Term Goals - 10/30/19 1846      PT LONG TERM GOAL #1   Title  Patient will reduce falls risk as indicated by Activities Specific Balance Confidence Scale (ABC) >67%.    Baseline  scored 56.3% on 10/30/19    Time  8    Period  Weeks    Status  New    Target Date  12/25/19      PT LONG TERM GOAL #2   Title  Patient will reduce perceived disability to low levels as indicated by <40 on Dizziness Handicap Inventory.    Baseline  scored 66/100 on 10/30/19    Time  8    Period  Weeks    Status  New    Target Date  12/25/19      PT LONG TERM GOAL #3   Title  Patient will have an improvement on FOTO score of 10 points or more in order to demonstrate improvements in patient's balance and strength in order for better functional independence with ADLs, work and hobby activities.    Baseline  scored 50/100 on FOTO    Time  8    Period  Weeks    Status  New    Target Date  12/25/19      PT LONG TERM GOAL #4   Title  Patient will demonstrate reduced falls risk as evidenced by Dynamic Gait Index (DGI) 21/24 or greater.    Time  8    Period  Weeks    Status  New    Target Date  12/25/19            Plan - 11/08/19 1903    Clinical Impression Statement  Patient challenged by body wall roll activities and quick turns during ambulation trials. Patient's balance challenged by activities on Airex pad with head turns and eyes closed trials. Plan on working on forward bending and quick turning activities next session as  well as working on progressions of VOR and wall turns. Patient would benefit from continued PT services to work towards goals and to try to decrease patient's subjective symptoms of dizziness and imbalance.    Personal Factors and Comorbidities  Comorbidity 3+;Past/Current Experience;Time since onset of injury/illness/exacerbation    Comorbidities  HTN, hyperlipediemia, seizure disorder, peripheral neuropathy, neck pain, CVA    Examination-Activity Limitations  Bend;Other   showering   Examination-Participation Restrictions  Other   working on cars   Stability/Clinical Decision Making  Evolving/Moderate complexity    Rehab Potential  Good    PT Frequency  1x / week    PT Duration  8 weeks    PT Treatment/Interventions  Canalith Repostioning;Gait training;Stair training;Therapeutic activities;Therapeutic exercise;Balance training;Neuromuscular re-education;Patient/family education;Vestibular;Manual techniques    PT Next Visit Plan  review VOR X 1 try standing; ambulation with head turns, Airex pad with head turns, step taps    PT Home Exercise Plan  VOR X 1 in sitting    Consulted and Agree with Plan of Care  Patient       Patient will benefit from skilled therapeutic intervention in order to improve the following deficits and impairments:  Difficulty walking, Dizziness, Decreased balance, Decreased mobility  Visit Diagnosis: Dizziness and giddiness  Unsteadiness on feet     Problem List Patient Active Problem List   Diagnosis Date Noted  . Embolism and thrombosis of unspecified parts of aorta ([place]) 08/29/2019  . Seizure disorder ([place]) 06/04/2019  . Neurologic abnormality 05/23/2019  . Sepsis due to undetermined organism with metabolic encephalopathy ([place]) 05/23/2019  . Dysphagia 04/25/2019  . Anemia 04/25/2019  . Left shoulder pain 05/29/2018  . Fatigue 02/06/2018  . Atherosclerotic PVD with intermittent claudication ([place]) 12/13/2017  . History of radiation to head and neck  region 12/13/2017  . Aortic occlusion (  [place]) 04/26/2017  . Xerostomia 04/26/2017  . Peripheral neuropathy due to ischemia 04/12/2017  . Absent pedal pulses 01/09/2017  . Claudication ([place]) 01/09/2017  . Goals of care, counseling/discussion 12/25/2016  . History of malignant neoplasm of oropharynx 12/07/2016  . Neck mass 10/07/2016  . Chest pain 02/07/2015  . Health care maintenance 11/03/2014  . Daytime somnolence 04/15/2014  . DDD (degenerative disc disease), cervical 02/07/2014  . Myofascial pain 02/07/2014  . Basal cell carcinoma of face 10/24/2013  . Neck pain 09/16/2013  . GERD (gastroesophageal reflux disease) 01/23/2013  . Dizziness 01/23/2013  . Essential hypertension, benign 01/17/2013  . Hypercholesterolemia 01/17/2013  . History of migraine headaches 02/16/2011   Lady Deutscher PT, DPT [phone] Lady Deutscher 11/08/2019, 7:27 PM  [facility] [address] [place], [place], [address] Phone: [phone]   Fax:  [phone]  Name: Timothy Garrison MRN: [id number] Date of Birth: 01-31-1961

## 2019-11-15 ENCOUNTER — Ambulatory Visit: Payer: Medicare Other | Admitting: Physical Therapy

## 2019-11-15 ENCOUNTER — Other Ambulatory Visit: Payer: Self-pay

## 2019-11-15 ENCOUNTER — Encounter: Payer: Self-pay | Admitting: Physical Therapy

## 2019-11-15 DIAGNOSIS — R2681 Unsteadiness on feet: Secondary | ICD-10-CM

## 2019-11-15 DIAGNOSIS — R42 Dizziness and giddiness: Secondary | ICD-10-CM

## 2019-11-15 NOTE — Therapy (Signed)
[facility] [address] [place], [place], [address] Phone: [phone]   Fax:  [phone]  Physical Therapy Treatment  Patient Details  Name: Timothy Garrison MRN: [id number] Date of Birth: [birth year] Referring Provider (PT): Dr. Jennings Books   Encounter Date: 11/15/2019  PT End of Session - 11/15/19 1456    Visit Number  3    Number of Visits  9    Date for PT Re-Evaluation  12/25/19    PT Start Time  1346    PT Stop Time  1431    PT Time Calculation (min)  45 min    Equipment Utilized During Treatment  Gait belt    Activity Tolerance  Patient tolerated treatment well    Behavior During Therapy  [facility] for tasks assessed/performed       Past Medical History:  Diagnosis Date  . Basal cell carcinoma (BCC) of back   . Basal cell carcinoma (BCC) of nasal sidewall   . Dizziness   . GERD (gastroesophageal reflux disease)   . H/O blood clots   . Hypercholesterolemia   . Hypertension    hx of HBP readings  . Metastatic squamous cell carcinoma ([place]) 2018   bx cervical lymph node  . Vascular abnormality   . VTE (venous thromboembolism) 2018   associated with chemotherapy    Past Surgical History:  Procedure Laterality Date  . COLONOSCOPY    . ESOPHAGOGASTRODUODENOSCOPY (EGD) WITH PROPOFOL N/A 05/24/2015   Procedure: ESOPHAGOGASTRODUODENOSCOPY (EGD) WITH PROPOFOL;  Surgeon: Manya Silvas, MD;  Location: [facility] ENDOSCOPY;  Service: Endoscopy;  Laterality: N/A;  . IR IMAGING GUIDED PORT INSERTION  12/09/2016  . SAVORY DILATION N/A 05/24/2015   Procedure: SAVORY DILATION;  Surgeon: Manya Silvas, MD;  Location: [facility] ENDOSCOPY;  Service: Endoscopy;  Laterality: N/A;    There were no vitals filed for this visit.  Subjective Assessment - 11/15/19 1455    Subjective  Patient states that he has been doing his exercises each day. Patient states that he has noticed that he is not having as much dizziness as he was before.    Pertinent History  Patient reports that he had a seizure in October 2020. Patient reports he had sensation of lights in his eyes, then was leaning and had difficulty walking, nausea and then shaking of his body. Patient reports he was taken to the hospital for care for unprovoked convulsive seizure in patient with right lateral temporal encephalomalacia per MR. Patient had CTA, MRI brain, EEG, ECHO, US carotids performed per MR. MRI reports per MR stated: Encephalomalacia of the right temporal lobe is new since 2013 and likely a sequela of a remote ischemic stroke. Per MR, CTA of the head revealed: the proximal Left ICA stenosis appears high-grade, and therefore may be numerically underestimated at 60%. Patient reports he is on [place] now and reports he has not had another seizure since October. Patient reports he had dizziness at the time of the seizure, but that he first started getting dizziness again about 6 to 8 months ago. Patient states he has tried massage therapy and chiropractic care, but states this has not improved his dizziness, but has improved his neck motion. Patient reports he was seen for dizziness by this writer about 5 to 6 years ago and states that the vestibular therapy helped. Patient reports that his dizziness was very manageable and he could work after the vestibular therapy 5-6 years ago. Patient states he still does some  of the exercises from his HEP each day. Patient describes his dizziness as imbalance and unstable feeling and denies veritgo. Patient reports he is getting dizziness daily, sometimes multiple times per day. Patient reports the dizziness lasts a minute or two. Washing his hair, looking up under a car, getting up quickly, bending over all bring on his dizziness. Patient reports sitting still makes it better. Patient reports he sees Dr. Tami Ribas every six months for throat cancer follow-ups and he has also been seen for the dizziness in the past.    Diagnostic tests  MRI  brian CTA, EEG, ECHO, US carotids    Patient Stated Goals  to have decreased dizziness       Neuromuscular Re-education:  VOR X 1 exercise:  Patient performed VOR X 1 horizontal in standing with conflicting background 3 reps of 1 minute each with good head speed. Patient demonstrates good technique.  Patient reports mild dizziness rated 3/10 with this activity.   Body Wall Rolls:  Patient performed 6 reps of supported, body wall rolls with eyes closed.  Then, performed multiple reps of unsupported, body wall rolls eyes open one rep and then about 5 reps eyes closed. Patient reports eyes closed is more difficult and that this creates mild dizziness with this activity.   Bounce Passes: Patient performed ambulation 175' trials while doing alternating sides bounce passes to self with ball while tracking ball with eyes and head.  Patient did well with this activity and denies dizziness and no evidence for imbalance with this activity.   Quick Turns:  Patient performed ambulation with alternating quick turns left and right as called out by therapist.  Patient did well with this activity and denies dizziness and no evidence for imbalance with this activity.   Worked on alternating bending left and right to pick up cones placed on floor and then turning to reach up to place cones. Performed forward and retro ambulation version of this activity and patient did well with no unsteadiness noted and patient denies increase in dizziness.   Dix-Hallpike: Performed left and right Dix-Hallpike tests and both were negative with patient denying vertigo and no nystagmus observed.  Habituation Exercise: Performed 10 reps left and then 10 reps right nose towards knee habituation exercise in sitting.  Patient reports moderate increase in his dizziness symptoms and reports 5-6/10 dizziness. Added this exercise for HEP.   PT Education - 11/15/19 1455    Education Details  added conflicting background  progression to VOR X 1 and added habituation exercise of nose towards knee to left and right side 10 reps in sitting to HEP    Person(s) Educated  Patient    Methods  Explanation;Demonstration;Verbal cues    Comprehension  Verbalized understanding;Returned demonstration       PT Short Term Goals - 11/15/19 1459      PT SHORT TERM GOAL #1   Title  Patient will be able to perform home program independently for self-management.    Time  4    Period  Weeks    Status  Achieved    Target Date  11/27/19        PT Long Term Goals - 10/30/19 1846      PT LONG TERM GOAL #1   Title  Patient will reduce falls risk as indicated by Activities Specific Balance Confidence Scale (ABC) >67%.    Baseline  scored 56.3% on 10/30/19    Time  8    Period  Weeks  Status  New    Target Date  12/25/19      PT LONG TERM GOAL #2   Title  Patient will reduce perceived disability to low levels as indicated by <40 on Dizziness Handicap Inventory.    Baseline  scored 66/100 on 10/30/19    Time  8    Period  Weeks    Status  New    Target Date  12/25/19      PT LONG TERM GOAL #3   Title  Patient will have an improvement on FOTO score of 10 points or more in order to demonstrate improvements in patient's balance and strength in order for better functional independence with ADLs, work and hobby activities.    Baseline  scored 50/100 on FOTO    Time  8    Period  Weeks    Status  New    Target Date  12/25/19      PT LONG TERM GOAL #4   Title  Patient will demonstrate reduced falls risk as evidenced by Dynamic Gait Index (DGI) 21/24 or greater.    Time  8    Period  Weeks    Status  New    Target Date  12/25/19            Plan - 11/15/19 1456    Clinical Impression Statement  Patient reports good compliance with HEP and demonstrates good motivation during session. Patient reports mild reproduction of dizziness with body wall rolls with eyes closed and moderate dizziness with habituation  exercise of nose towards knee. Added nose to knee habituation exercise to HEP as well as conflicting background progression for VOR X1 exercise. Patient is making good progress with therapy. Patient would benefit from continued PT services to further address goals and to try to decrease patient's subjective symptoms of dizziness.    Personal Factors and Comorbidities  Comorbidity 3+;Past/Current Experience;Time since onset of injury/illness/exacerbation    Comorbidities  HTN, hyperlipediemia, seizure disorder, peripheral neuropathy, neck pain, CVA    Examination-Activity Limitations  Bend;Other   showering   Examination-Participation Restrictions  Other   working on cars   Stability/Clinical Decision Making  Evolving/Moderate complexity    Rehab Potential  Good    PT Frequency  1x / week    PT Duration  8 weeks    PT Treatment/Interventions  Canalith Repostioning;Gait training;Stair training;Therapeutic activities;Therapeutic exercise;Balance training;Neuromuscular re-education;Patient/family education;Vestibular;Manual techniques    PT Next Visit Plan  review VOR X 1 try standing; ambulation with head turns, Airex pad with head turns, step taps    PT Home Exercise Plan  VOR X 1 in sitting    Consulted and Agree with Plan of Care  Patient       Patient will benefit from skilled therapeutic intervention in order to improve the following deficits and impairments:  Difficulty walking, Dizziness, Decreased balance, Decreased mobility  Visit Diagnosis: Dizziness and giddiness  Unsteadiness on feet     Problem List Patient Active Problem List   Diagnosis Date Noted  . Embolism and thrombosis of unspecified parts of aorta ([place]) 08/29/2019  . Seizure disorder ([place]) 06/04/2019  . Neurologic abnormality 05/23/2019  . Sepsis due to undetermined organism with metabolic encephalopathy ([place]) 05/23/2019  . Dysphagia 04/25/2019  . Anemia 04/25/2019  . Left shoulder pain 05/29/2018  . Fatigue  02/06/2018  . Atherosclerotic PVD with intermittent claudication ([place]) 12/13/2017  . History of radiation to head and neck region 12/13/2017  . Aortic occlusion ([place]) 04/26/2017  .  Xerostomia 04/26/2017  . Peripheral neuropathy due to ischemia 04/12/2017  . Absent pedal pulses 01/09/2017  . Claudication ([place]) 01/09/2017  . Goals of care, counseling/discussion 12/25/2016  . History of malignant neoplasm of oropharynx 12/07/2016  . Neck mass 10/07/2016  . Chest pain 02/07/2015  . Health care maintenance 11/03/2014  . Daytime somnolence 04/15/2014  . DDD (degenerative disc disease), cervical 02/07/2014  . Myofascial pain 02/07/2014  . Basal cell carcinoma of face 10/24/2013  . Neck pain 09/16/2013  . GERD (gastroesophageal reflux disease) 01/23/2013  . Dizziness 01/23/2013  . Essential hypertension, benign 01/17/2013  . Hypercholesterolemia 01/17/2013  . History of migraine headaches 02/16/2011   Lady Deutscher PT, DPT [phone] Lady Deutscher 11/15/2019, 3:08 PM  [facility] [address] [place], [place], [address] Phone: [phone]   Fax:  [phone]  Name: ANIK KOWALIK MRN: [id number] Date of Birth: 05-16-1961

## 2019-11-22 ENCOUNTER — Other Ambulatory Visit: Payer: Self-pay

## 2019-11-22 ENCOUNTER — Ambulatory Visit: Payer: Medicare Other | Attending: Neurology | Admitting: Physical Therapy

## 2019-11-22 ENCOUNTER — Encounter: Payer: Self-pay | Admitting: Physical Therapy

## 2019-11-22 DIAGNOSIS — R2681 Unsteadiness on feet: Secondary | ICD-10-CM | POA: Insufficient documentation

## 2019-11-22 DIAGNOSIS — R42 Dizziness and giddiness: Secondary | ICD-10-CM | POA: Insufficient documentation

## 2019-11-22 NOTE — Therapy (Signed)
[facility] [address] [place], [place], [address] Phone: [phone]   Fax:  [phone]  Physical Therapy Treatment  Patient Details  Name: Timothy Garrison MRN: [id number] Date of Birth: [birth year] Referring Provider (PT): Dr. Jennings Books   Encounter Date: 11/22/2019  PT End of Session - 11/22/19 1354    Visit Number  4    Number of Visits  9    Date for PT Re-Evaluation  12/25/19    PT Start Time  1351    PT Stop Time  1432    PT Time Calculation (min)  41 min    Equipment Utilized During Treatment  Gait belt    Activity Tolerance  Patient tolerated treatment well    Behavior During Therapy  [facility] for tasks assessed/performed       Past Medical History:  Diagnosis Date  . Basal cell carcinoma (BCC) of back   . Basal cell carcinoma (BCC) of nasal sidewall   . Dizziness   . GERD (gastroesophageal reflux disease)   . H/O blood clots   . Hypercholesterolemia   . Hypertension    hx of HBP readings  . Metastatic squamous cell carcinoma ([place]) 2018   bx cervical lymph node  . Vascular abnormality   . VTE (venous thromboembolism) 2018   associated with chemotherapy    Past Surgical History:  Procedure Laterality Date  . COLONOSCOPY    . ESOPHAGOGASTRODUODENOSCOPY (EGD) WITH PROPOFOL N/A 05/24/2015   Procedure: ESOPHAGOGASTRODUODENOSCOPY (EGD) WITH PROPOFOL;  Surgeon: Manya Silvas, MD;  Location: [facility] ENDOSCOPY;  Service: Endoscopy;  Laterality: N/A;  . IR IMAGING GUIDED PORT INSERTION  12/09/2016  . SAVORY DILATION N/A 05/24/2015   Procedure: SAVORY DILATION;  Surgeon: Manya Silvas, MD;  Location: [facility] ENDOSCOPY;  Service: Endoscopy;  Laterality: N/A;    There were no vitals filed for this visit.  Subjective Assessment - 11/22/19 1352    Subjective  Patient states that he did some of his exercises this past week. Patient states the habituation exercise provokes his symptoms and causes some nausea at times. Patient  reports that he thinks that he did too many reps at a time. Discussed trying to do 10 reps nose to knee to left and then 10 reps to right side once to twice per day. Patient verbalizes agreement. Patient reports that he has started to drive again.    Pertinent History  Patient reports that he had a seizure in October 2020. Patient reports he had sensation of lights in his eyes, then was leaning and had difficulty walking, nausea and then shaking of his body. Patient reports he was taken to the hospital for care for unprovoked convulsive seizure in patient with right lateral temporal encephalomalacia per MR. Patient had CTA, MRI brain, EEG, ECHO, US carotids performed per MR. MRI reports per MR stated: Encephalomalacia of the right temporal lobe is new since 2013 and likely a sequela of a remote ischemic stroke. Per MR, CTA of the head revealed: the proximal Left ICA stenosis appears high-grade, and therefore may be numerically underestimated at 60%. Patient reports he is on [place] now and reports he has not had another seizure since October. Patient reports he had dizziness at the time of the seizure, but that he first started getting dizziness again about 6 to 8 months ago. Patient states he has tried massage therapy and chiropractic care, but states this has not improved his dizziness, but has improved his neck motion. Patient  reports he was seen for dizziness by this writer about 5 to 6 years ago and states that the vestibular therapy helped. Patient reports that his dizziness was very manageable and he could work after the vestibular therapy 5-6 years ago. Patient states he still does some of the exercises from his HEP each day. Patient describes his dizziness as imbalance and unstable feeling and denies veritgo. Patient reports he is getting dizziness daily, sometimes multiple times per day. Patient reports the dizziness lasts a minute or two. Washing his hair, looking up under a car, getting up quickly,  bending over all bring on his dizziness. Patient reports sitting still makes it better. Patient reports he sees Dr. Tami Ribas every six months for throat cancer follow-ups and he has also been seen for the dizziness in the past.    Diagnostic tests  MRI brian CTA, EEG, ECHO, US carotids    Patient Stated Goals  to have decreased dizziness       Neuromuscular Re-education:  VOR X 1 exercise:  Patient performed walking VOR X 1 horizontal with conflicting background 4 reps of 40' each. Performed 2 of the four reps with lower light.  Patient reports mild symptoms rated 3/10 with this activity.    Body Wall Rolls:  Patient performed 6 reps of unsupported, body wall rolls with eyes closed with EC. Patient did well with this activity with no loss of balance and was able to stay in fairly good alignment throughout.  Patient reports mild dizziness with this activity.   VOR x 2 exercise:  Demonstrated and educated as to VOR X 2. Patient performed VOR X 2 horiz in sitting 3 reps of 1 minute each with verbal cues for technique.  Patient reports no dizziness but blurring of target at slower speeds. Issued for HEP.  Habituation Exercise: Performed 10 reps left and then 10 reps right nose towards knee habituation exercise in sitting.  Patient reports moderate increase in his dizziness symptoms and reports 7-8/10 dizziness. Patient reports the dizziness is most intense with the first rep on both sides.  Patient reports blurring of vision with coming back upright but not when held in forward nose towards knee position.    Card Sorting Activity:  Patient performed deck of card sorting activity on low step sorting by numbers and then turning to place cards on high side table to incorporate horizontal and vertical head turning and body turns. Repeated activity standing on firm blue foam floor mat. Patient reports no dizziness with this activity, but states mild blurring while scanning for cards. .      PT Education - 11/22/19 1354    Education Details  added VOR X 2 horizontal to HEP    Person(s) Educated  Patient    Methods  Explanation;Demonstration;Verbal cues;Handout    Comprehension  Verbalized understanding;Returned demonstration       PT Short Term Goals - 11/15/19 1459      PT SHORT TERM GOAL #1   Title  Patient will be able to perform home program independently for self-management.    Time  4    Period  Weeks    Status  Achieved    Target Date  11/27/19        PT Long Term Goals - 10/30/19 1846      PT LONG TERM GOAL #1   Title  Patient will reduce falls risk as indicated by Activities Specific Balance Confidence Scale (ABC) >67%.    Baseline  scored 56.3% on  10/30/19    Time  8    Period  Weeks    Status  New    Target Date  12/25/19      PT LONG TERM GOAL #2   Title  Patient will reduce perceived disability to low levels as indicated by <40 on Dizziness Handicap Inventory.    Baseline  scored 66/100 on 10/30/19    Time  8    Period  Weeks    Status  New    Target Date  12/25/19      PT LONG TERM GOAL #3   Title  Patient will have an improvement on FOTO score of 10 points or more in order to demonstrate improvements in patient's balance and strength in order for better functional independence with ADLs, work and hobby activities.    Baseline  scored 50/100 on FOTO    Time  8    Period  Weeks    Status  New    Target Date  12/25/19      PT LONG TERM GOAL #4   Title  Patient will demonstrate reduced falls risk as evidenced by Dynamic Gait Index (DGI) 21/24 or greater.    Time  8    Period  Weeks    Status  New    Target Date  12/25/19            Plan - 11/22/19 1853    Clinical Impression Statement  Patient reports that the body wall rolls and nose to knee habituation exercises most closely recreate the dizziness that he experiences when getting down under cars to work on them. Patient also challenged by quick alternating quick turns with  the card sorting activity this date. Patient continues to report blurring of objects with head movements and added VOR X2 exercise to HEP. Will plan on retesting functional outcome measures in next session.Patient would benefit from PT services to continue to work towards goals and to try to reduce patient's subjective symptom.    Personal Factors and Comorbidities  Comorbidity 3+;Past/Current Experience;Time since onset of injury/illness/exacerbation    Comorbidities  HTN, hyperlipediemia, seizure disorder, peripheral neuropathy, neck pain, CVA    Examination-Activity Limitations  Bend;Other   showering   Examination-Participation Restrictions  Other   working on cars   Stability/Clinical Decision Making  Evolving/Moderate complexity    Rehab Potential  Good    PT Frequency  1x / week    PT Duration  8 weeks    PT Treatment/Interventions  Canalith Repostioning;Gait training;Stair training;Therapeutic activities;Therapeutic exercise;Balance training;Neuromuscular re-education;Patient/family education;Vestibular;Manual techniques    PT Next Visit Plan  review VOR X 1 try standing; ambulation with head turns, Airex pad with head turns, step taps    PT Home Exercise Plan  VOR X 1 in sitting    Consulted and Agree with Plan of Care  Patient       Patient will benefit from skilled therapeutic intervention in order to improve the following deficits and impairments:  Difficulty walking, Dizziness, Decreased balance, Decreased mobility  Visit Diagnosis: Dizziness and giddiness  Unsteadiness on feet     Problem List Patient Active Problem List   Diagnosis Date Noted  . Embolism and thrombosis of unspecified parts of aorta ([place]) 08/29/2019  . Seizure disorder ([place]) 06/04/2019  . Neurologic abnormality 05/23/2019  . Sepsis due to undetermined organism with metabolic encephalopathy ([place]) 05/23/2019  . Dysphagia 04/25/2019  . Anemia 04/25/2019  . Left shoulder pain 05/29/2018  . Fatigue  02/06/2018  .  Atherosclerotic PVD with intermittent claudication ([place]) 12/13/2017  . History of radiation to head and neck region 12/13/2017  . Aortic occlusion ([place]) 04/26/2017  . Xerostomia 04/26/2017  . Peripheral neuropathy due to ischemia 04/12/2017  . Absent pedal pulses 01/09/2017  . Claudication ([place]) 01/09/2017  . Goals of care, counseling/discussion 12/25/2016  . History of malignant neoplasm of oropharynx 12/07/2016  . Neck mass 10/07/2016  . Chest pain 02/07/2015  . Health care maintenance 11/03/2014  . Daytime somnolence 04/15/2014  . DDD (degenerative disc disease), cervical 02/07/2014  . Myofascial pain 02/07/2014  . Basal cell carcinoma of face 10/24/2013  . Neck pain 09/16/2013  . GERD (gastroesophageal reflux disease) 01/23/2013  . Dizziness 01/23/2013  . Essential hypertension, benign 01/17/2013  . Hypercholesterolemia 01/17/2013  . History of migraine headaches 02/16/2011   Lady Deutscher PT, DPT [phone] Lady Deutscher 11/22/2019, 6:58 PM  [facility] [address] [place], [place], [address] Phone: [phone]   Fax:  [phone]  Name: GERAMIAH RENNAKER MRN: [id number] Date of Birth: 1960/09/20

## 2019-11-29 ENCOUNTER — Ambulatory Visit: Payer: Medicare Other | Admitting: Physical Therapy

## 2019-11-29 ENCOUNTER — Other Ambulatory Visit: Payer: Self-pay

## 2019-11-29 ENCOUNTER — Encounter: Payer: Self-pay | Admitting: Physical Therapy

## 2019-11-29 DIAGNOSIS — R42 Dizziness and giddiness: Secondary | ICD-10-CM | POA: Diagnosis not present

## 2019-11-29 DIAGNOSIS — R2681 Unsteadiness on feet: Secondary | ICD-10-CM

## 2019-11-29 NOTE — Therapy (Addendum)
[facility] [address] [place], [place], [address] Phone: [phone]   Fax:  [phone]  Physical Therapy Treatment  Patient Details  Name: Timothy Garrison MRN: [id number] Date of Birth: [birth year] Referring Provider (PT): Dr. Jennings Books   Encounter Date: 11/29/2019  PT End of Session - 11/29/19 1351    Visit Number  5    Number of Visits  9    Date for PT Re-Evaluation  12/25/19    PT Start Time  0147    PT Stop Time  0230    PT Time Calculation (min)  43 min    Equipment Utilized During Treatment  Gait belt    Activity Tolerance  Patient tolerated treatment well    Behavior During Therapy  [facility] for tasks assessed/performed       Past Medical History:  Diagnosis Date  . Basal cell carcinoma (BCC) of back   . Basal cell carcinoma (BCC) of nasal sidewall   . Dizziness   . GERD (gastroesophageal reflux disease)   . H/O blood clots   . Hypercholesterolemia   . Hypertension    hx of HBP readings  . Metastatic squamous cell carcinoma ([place]) 2018   bx cervical lymph node  . Vascular abnormality   . VTE (venous thromboembolism) 2018   associated with chemotherapy    Past Surgical History:  Procedure Laterality Date  . COLONOSCOPY    . ESOPHAGOGASTRODUODENOSCOPY (EGD) WITH PROPOFOL N/A 05/24/2015   Procedure: ESOPHAGOGASTRODUODENOSCOPY (EGD) WITH PROPOFOL;  Surgeon: Manya Silvas, MD;  Location: [facility] ENDOSCOPY;  Service: Endoscopy;  Laterality: N/A;  . IR IMAGING GUIDED PORT INSERTION  12/09/2016  . SAVORY DILATION N/A 05/24/2015   Procedure: SAVORY DILATION;  Surgeon: Manya Silvas, MD;  Location: [facility] ENDOSCOPY;  Service: Endoscopy;  Laterality: N/A;    There were no vitals filed for this visit.  Subjective Assessment - 11/30/19 1003    Subjective  Patient reports that is feeling better but states that bending over activities continue to bring on his symptoms.    Pertinent History  Patient reports that he had a  seizure in October 2020. Patient reports he had sensation of lights in his eyes, then was leaning and had difficulty walking, nausea and then shaking of his body. Patient reports he was taken to the hospital for care for unprovoked convulsive seizure in patient with right lateral temporal encephalomalacia per MR. Patient had CTA, MRI brain, EEG, ECHO, US carotids performed per MR. MRI reports per MR stated: Encephalomalacia of the right temporal lobe is new since 2013 and likely a sequela of a remote ischemic stroke. Per MR, CTA of the head revealed: the proximal Left ICA stenosis appears high-grade, and therefore may be numerically underestimated at 60%. Patient reports he is on [place] now and reports he has not had another seizure since October. Patient reports he had dizziness at the time of the seizure, but that he first started getting dizziness again about 6 to 8 months ago. Patient states he has tried massage therapy and chiropractic care, but states this has not improved his dizziness, but has improved his neck motion. Patient reports he was seen for dizziness by this writer about 5 to 6 years ago and states that the vestibular therapy helped. Patient reports that his dizziness was very manageable and he could work after the vestibular therapy 5-6 years ago. Patient states he still does some of the exercises from his HEP each day. Patient describes  his dizziness as imbalance and unstable feeling and denies veritgo. Patient reports he is getting dizziness daily, sometimes multiple times per day. Patient reports the dizziness lasts a minute or two. Washing his hair, looking up under a car, getting up quickly, bending over all bring on his dizziness. Patient reports sitting still makes it better. Patient reports he sees Dr. Tami Ribas every six months for throat cancer follow-ups and he has also been seen for the dizziness in the past.    Diagnostic tests  MRI brian CTA, EEG, ECHO, US carotids    Patient Stated  Goals  to have decreased dizziness        Neuromuscular Re-education:  FUNCTIONAL OUTCOME MEASURES:  Results Comments  DHI 40/100 Moderate perception of handicap; in need of intervention  ABC Scale 64.4% Falls risk; in need of intervention  DGI 23/24 normal  FOTO 56/100    Discussed functional outcome testing results and compared to prior testing and discussed progress towards goals.   Airex balance beam:  Patient stood on Airex balance beam and practice sidestepping along beam while bending forward to retrieve cones off floor and turned to place targets at shoulder level. Then, performed bending forward to retrieve cones off floor and then turning and placing on floor to the left side and then repeated to the right side. Performed several reps of this activity. Patient required CGA with this activity and patient reports mild dizziness and difficulty with balance.  Ball toss over shoulder: Patient performed 1 rep of 87' forward ambulation while tossing ball over one shoulder with return catch over opposite shoulder with CGA.  Patient performed multiple 82' trials of  retro ambulation while tossing ball over one shoulder with return catch over opposite shoulder varying the ball position to head, shoulder and waist level to promote head turning and tilting.  Patient initially taking very small step-to step pattern with retro ambulation, but improved with practice and cuing and was able to increase step length and speed of activity. With increased speed, patient reports that this activity is challenging and caused some dizziness, had difficulty concentrating and focusing.  Ambulation with head turns:  Patient performed 50' forwards and retro ambulation with diagonal and then random head turns-horizontal, vertical and diagonals with CGA.   Patient reports that the retro ambulation is challenging especially with diagonal head turns is challenging.     PT Education - 11/29/19 1352     Education Details  discussed functional outcome measures and discussed progress towards goals    Person(s) Educated  Patient    Methods  Explanation    Comprehension  Verbalized understanding       PT Short Term Goals - 11/15/19 1459      PT SHORT TERM GOAL #1   Title  Patient will be able to perform home program independently for self-management.    Time  4    Period  Weeks    Status  Achieved    Target Date  11/27/19        PT Long Term Goals - 11/29/19 1353      PT LONG TERM GOAL #1   Title  Patient will reduce falls risk as indicated by Activities Specific Balance Confidence Scale (ABC) >67%.    Baseline  scored 56.3% on 10/30/19; scored   64.4% on 11/29/19    Time  8    Period  Weeks    Status  Partially Met      PT LONG TERM GOAL #2  Title  Patient will reduce perceived disability to low levels as indicated by <40 on Dizziness Handicap Inventory.    Baseline  scored 66/100 on 10/30/19; scored  40/100 on 11/29/19    Time  8    Period  Weeks    Status  Partially Met      PT LONG TERM GOAL #3   Title  Patient will have an improvement on FOTO score of 10 points or more in order to demonstrate improvements in patient's balance and strength in order for better functional independence with ADLs, work and hobby activities.    Baseline  scored 50/100 on FOTO; scored 56 /100 on 11/29/19    Time  8    Period  Weeks    Status  Partially Met      PT LONG TERM GOAL #4   Title  Patient will demonstrate reduced falls risk as evidenced by Dynamic Gait Index (DGI) 21/24 or greater.    Baseline  scored 18/24 on 3/21; scored  23/24 on 11/29/19    Time  8    Period  Weeks    Status  Achieved            Plan - 11/29/19 1746    Clinical Impression Statement  Repeated functional measure testing and patient met one goal and partially met all remaining goals. Patient reports that he feels like he is doing better and making progress with therapy. Patient reports that he continues to  have dizziness when bending forward and reaching. Patient challenged in the clinic by retro ambulation with diagonals and Airex balance beam with bending activities. Patient would benefit from continued PT services to further address goals and subjective symptoms of dizziness and imbalance.    Personal Factors and Comorbidities  Comorbidity 3+;Past/Current Experience;Time since onset of injury/illness/exacerbation    Comorbidities  HTN, hyperlipediemia, seizure disorder, peripheral neuropathy, neck pain, CVA    Examination-Activity Limitations  Bend;Other   showering   Examination-Participation Restrictions  Other   working on cars   Stability/Clinical Decision Making  Evolving/Moderate complexity    Rehab Potential  Good    PT Frequency  1x / week    PT Duration  8 weeks    PT Treatment/Interventions  Canalith Repostioning;Gait training;Stair training;Therapeutic activities;Therapeutic exercise;Balance training;Neuromuscular re-education;Patient/family education;Vestibular;Manual techniques    PT Next Visit Plan  review VOR X 1 try standing; ambulation with head turns, Airex pad with head turns, step taps    PT Home Exercise Plan  VOR X 1 in sitting    Consulted and Agree with Plan of Care  Patient       Patient will benefit from skilled therapeutic intervention in order to improve the following deficits and impairments:  Difficulty walking, Dizziness, Decreased balance, Decreased mobility  Visit Diagnosis: Dizziness and giddiness  Unsteadiness on feet     Problem List Patient Active Problem List   Diagnosis Date Noted  . Embolism and thrombosis of unspecified parts of aorta ([place]) 08/29/2019  . Seizure disorder ([place]) 06/04/2019  . Neurologic abnormality 05/23/2019  . Sepsis due to undetermined organism with metabolic encephalopathy ([place]) 05/23/2019  . Dysphagia 04/25/2019  . Anemia 04/25/2019  . Left shoulder pain 05/29/2018  . Fatigue 02/06/2018  . Atherosclerotic PVD with  intermittent claudication ([place]) 12/13/2017  . History of radiation to head and neck region 12/13/2017  . Aortic occlusion ([place]) 04/26/2017  . Xerostomia 04/26/2017  . Peripheral neuropathy due to ischemia 04/12/2017  . Absent pedal pulses 01/09/2017  . Claudication (  [place]) 01/09/2017  . Goals of care, counseling/discussion 12/25/2016  . History of malignant neoplasm of oropharynx 12/07/2016  . Neck mass 10/07/2016  . Chest pain 02/07/2015  . Health care maintenance 11/03/2014  . Daytime somnolence 04/15/2014  . DDD (degenerative disc disease), cervical 02/07/2014  . Myofascial pain 02/07/2014  . Basal cell carcinoma of face 10/24/2013  . Neck pain 09/16/2013  . GERD (gastroesophageal reflux disease) 01/23/2013  . Dizziness 01/23/2013  . Essential hypertension, benign 01/17/2013  . Hypercholesterolemia 01/17/2013  . History of migraine headaches 02/16/2011   Lady Deutscher PT, DPT [phone] Lady Deutscher 11/29/2019, 5:58 PM  [facility] [address] [place], [place], [address] Phone: [phone]   Fax:  [phone]  Name: JAHAD OLD MRN: [id number] Date of Birth: 1961/08/16

## 2019-12-06 ENCOUNTER — Ambulatory Visit: Payer: Medicare Other | Admitting: Physical Therapy

## 2019-12-06 ENCOUNTER — Other Ambulatory Visit: Payer: Self-pay

## 2019-12-06 ENCOUNTER — Encounter: Payer: Self-pay | Admitting: Physical Therapy

## 2019-12-06 DIAGNOSIS — R2681 Unsteadiness on feet: Secondary | ICD-10-CM | POA: Diagnosis not present

## 2019-12-06 DIAGNOSIS — R42 Dizziness and giddiness: Secondary | ICD-10-CM | POA: Diagnosis not present

## 2019-12-06 NOTE — Therapy (Signed)
[place] [facility] [address] [place], [place], [address] Phone: [phone]   Fax:  [phone]  Physical Therapy Treatment  Patient Details  Name: Timothy Garrison MRN: [id number] Date of Birth: 1961-02-27 Referring Provider (PT): Dr. Jennings Books   Encounter Date: 12/06/2019  PT End of Session - 12/06/19 1352    Visit Number  6    Number of Visits  9    Date for PT Re-Evaluation  12/25/19    PT Start Time  9622    PT Stop Time  1434    PT Time Calculation (min)  49 min    Equipment Utilized During Treatment  Gait belt    Activity Tolerance  Patient tolerated treatment well    Behavior During Therapy  [facility] for tasks assessed/performed       Past Medical History:  Diagnosis Date  . Basal cell carcinoma (BCC) of back   . Basal cell carcinoma (BCC) of nasal sidewall   . Dizziness   . GERD (gastroesophageal reflux disease)   . H/O blood clots   . Hypercholesterolemia   . Hypertension    hx of HBP readings  . Metastatic squamous cell carcinoma ([place]) 2018   bx cervical lymph node  . Vascular abnormality   . VTE (venous thromboembolism) 2018   associated with chemotherapy    Past Surgical History:  Procedure Laterality Date  . COLONOSCOPY    . ESOPHAGOGASTRODUODENOSCOPY (EGD) WITH PROPOFOL N/A 05/24/2015   Procedure: ESOPHAGOGASTRODUODENOSCOPY (EGD) WITH PROPOFOL;  Surgeon: Manya Silvas, MD;  Location: [facility] ENDOSCOPY;  Service: Endoscopy;  Laterality: N/A;  . IR IMAGING GUIDED PORT INSERTION  12/09/2016  . SAVORY DILATION N/A 05/24/2015   Procedure: SAVORY DILATION;  Surgeon: Manya Silvas, MD;  Location: [facility] ENDOSCOPY;  Service: Endoscopy;  Laterality: N/A;    There were no vitals filed for this visit.  Subjective Assessment - 12/06/19 1347    Subjective  Patient states that he was standing talking to someone while working today and that his neck felt tight and he started to get dizziness and floating sensation.  Patient states he stood still and it eased. 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 Patient reports his neck gets tight especially upper trap muscles and discussed that this could contribute to his dizziness symptoms.    Pertinent History  Patient reports that he had a seizure in October 2020. Patient reports he had sensation of lights in his eyes, then was leaning and had difficulty walking, nausea and then shaking of his body. Patient reports he was taken to the hospital for care for unprovoked convulsive seizure in patient with right lateral temporal encephalomalacia per MR. Patient had CTA, MRI brain, EEG, ECHO, US carotids performed per MR. MRI reports per MR stated: Encephalomalacia of the right temporal lobe is new since 2013 and likely a sequela of a remote ischemic stroke. Per MR, CTA of the head revealed: the proximal Left ICA stenosis appears high-grade, and therefore may be numerically underestimated at 60%. Patient reports he is on [place] now and reports he has not had another seizure since October. Patient reports he had dizziness at the time of the seizure, but that he first started getting dizziness again about 6 to 8 months ago. Patient states he has tried massage therapy and chiropractic care, but states this has not improved his dizziness, but has improved his neck motion. Patient reports he  was seen for dizziness by this writer about 5 to 6 years ago and states that the vestibular therapy helped. Patient reports that his dizziness was very manageable and he could work after the vestibular therapy 5-6 years ago. Patient states he still does some of the exercises from his HEP each day. Patient describes his dizziness as imbalance and unstable feeling and denies veritgo. Patient reports he is getting dizziness daily, sometimes multiple times per day. Patient reports the dizziness lasts a minute or two. Washing his hair, looking up under a car,  getting up quickly, bending over all bring on his dizziness. Patient reports sitting still makes it better. Patient reports he sees Dr. Tami Ribas every six months for throat cancer follow-ups and he has also been seen for the dizziness in the past.    Diagnostic tests  MRI brian CTA, EEG, ECHO, US carotids    Patient Stated Goals  to have decreased dizziness       Therapeutic Exercise:  Patient noted to have bilateral tightness in upper trapezius muscles.  Demonstrated and educated patient as to seated upper trap stretch and patient performed bilateral stretches. Patient educated as to shoulder shrugs, shoulder rolls and chin tuck all in seated position. Patient demonstrated chin tucks. Patient provided with MedBridge handouts of exercises for HEP.  Patient instructed to try to take a 5-10 minute break when he is working and to perform the above exercises ending with the upper trap stretch.   Neuromuscular Re-education:  On blue mat table, patient on all fours and reached for objects above his head and to the right side and patient would transfer one object at a time from the right side to the left while tracking the object with his eyes and head. Once all objects were on the left side repeated moving all the objects back to the right side. Attempted this activity to try to recreate some of the movements that patient does at work when working under cars and reaching. Patient reports no floating or dizziness sensation with this activity, but reports tightening of neck muscles.   Ambulation with head turns:  Patient performed 58' trials of retro ambulation with patient self-selected random horizontal, vertical and diagonal head turns with CGA.  Patient demonstrates no veering with retro ambulation with head turns, but upon stopping during some of the trials patient reported floating dizziness sensation which he states was like what he had experienced earlier this morning.  Ball toss over  shoulder: Patient performed multiple 10' trials of retro ambulation while tossing ball over one shoulder with return catch over opposite shoulder varying the ball position to head, shoulder and waist level to promote head turning and tilting.    PT Education - 12/06/19 1442    Education Details  provided MedBridge exercises- seated chin tucks, shoulder rolls, shoulder shrugs and seated upper trap stretches.    Person(s) Educated  Patient    Methods  Explanation;Demonstration;Verbal cues;Handout    Comprehension  Returned demonstration;Verbalized understanding       PT Short Term Goals - 11/15/19 1459      PT SHORT TERM GOAL #1   Title  Patient will be able to perform home program independently for self-management.    Time  4    Period  Weeks    Status  Achieved    Target Date  11/27/19        PT Long Term Goals - 11/29/19 1353      PT LONG TERM GOAL #1  Title  Patient will reduce falls risk as indicated by Activities Specific Balance Confidence Scale (ABC) >67%.    Baseline  scored 56.3% on 10/30/19; scored   64.4% on 11/29/19    Time  8    Period  Weeks    Status  Partially Met      PT LONG TERM GOAL #2   Title  Patient will reduce perceived disability to low levels as indicated by <40 on Dizziness Handicap Inventory.    Baseline  scored 66/100 on 10/30/19; scored  40/100 on 11/29/19    Time  8    Period  Weeks    Status  Partially Met      PT LONG TERM GOAL #3   Title  Patient will have an improvement on FOTO score of 10 points or more in order to demonstrate improvements in patient's balance and strength in order for better functional independence with ADLs, work and hobby activities.    Baseline  scored 50/100 on FOTO; scored 56 /100 on 11/29/19    Time  8    Period  Weeks    Status  Partially Met      PT LONG TERM GOAL #4   Title  Patient will demonstrate reduced falls risk as evidenced by Dynamic Gait Index (DGI) 21/24 or greater.    Baseline  scored 18/24 on 3/21;  scored  23/24 on 11/29/19    Time  8    Period  Weeks    Status  Achieved            Plan - 12/06/19 1415    Clinical Impression Statement  Patient did well with mat activity and this did not recreate his dizziness symptoms, however, patient reports that the retro ambulation with random head turns did recreate the dizziness that he experienced earlier this morning. Patient also reports retro ambulation with ball toss to varying heights caused mild symptoms. Will plan on repeating these activities as well as doing Airex balance beam standing with bending and turning activities next session. Patient would benefit from continued PT services to continue to work towards goals.    Personal Factors and Comorbidities  Comorbidity 3+;Past/Current Experience;Time since onset of injury/illness/exacerbation    Comorbidities  HTN, hyperlipediemia, seizure disorder, peripheral neuropathy, neck pain, CVA    Examination-Activity Limitations  Bend;Other   showering   Examination-Participation Restrictions  Other   working on cars   Stability/Clinical Decision Making  Evolving/Moderate complexity    Rehab Potential  Good    PT Frequency  1x / week    PT Duration  8 weeks    PT Treatment/Interventions  Canalith Repostioning;Gait training;Stair training;Therapeutic activities;Therapeutic exercise;Balance training;Neuromuscular re-education;Patient/family education;Vestibular;Manual techniques    PT Next Visit Plan  review VOR X 1 try standing; ambulation with head turns, Airex pad with head turns, step taps    PT Home Exercise Plan  VOR X 1 in sitting; Access Code: [email or web address]: [email or web address]: 04/21/2021Prepared by: Melchor Amour MurphyExercisesSeated Cervical Retraction Protraction AROM - 1 x daily - 7 x weekly - 1 sets - 10 repsSeated Shoulder Shrug - 1 x daily - 7 x weekly - 1 sets - 10 repsSeated Shoulder Rolls - 1 x daily - 7 x weekly - 1 sets - 10 repsSeated Upper Trapezius Stretch - 1  x daily - 7 x weekly - 1 sets - 5 reps - 30-60 seconds hold    Consulted and Agree with Plan of Care  Patient       Patient will  benefit from skilled therapeutic intervention in order to improve the following deficits and impairments:  Difficulty walking, Dizziness, Decreased balance, Decreased mobility  Visit Diagnosis: Dizziness and giddiness  Unsteadiness on feet     Problem List Patient Active Problem List   Diagnosis Date Noted  . Embolism and thrombosis of unspecified parts of aorta ([place]) 08/29/2019  . Seizure disorder ([place]) 06/04/2019  . Neurologic abnormality 05/23/2019  . Sepsis due to undetermined organism with metabolic encephalopathy ([place]) 05/23/2019  . Dysphagia 04/25/2019  . Anemia 04/25/2019  . Left shoulder pain 05/29/2018  . Fatigue 02/06/2018  . Atherosclerotic PVD with intermittent claudication ([place]) 12/13/2017  . History of radiation to head and neck region 12/13/2017  . Aortic occlusion ([place]) 04/26/2017  . Xerostomia 04/26/2017  . Peripheral neuropathy due to ischemia 04/12/2017  . Absent pedal pulses 01/09/2017  . Claudication ([place]) 01/09/2017  . Goals of care, counseling/discussion 12/25/2016  . History of malignant neoplasm of oropharynx 12/07/2016  . Neck mass 10/07/2016  . Chest pain 02/07/2015  . Health care maintenance 11/03/2014  . Daytime somnolence 04/15/2014  . DDD (degenerative disc disease), cervical 02/07/2014  . Myofascial pain 02/07/2014  . Basal cell carcinoma of face 10/24/2013  . Neck pain 09/16/2013  . GERD (gastroesophageal reflux disease) 01/23/2013  . Dizziness 01/23/2013  . Essential hypertension, benign 01/17/2013  . Hypercholesterolemia 01/17/2013  . History of migraine headaches 02/16/2011   Lady Deutscher PT, DPT [phone] Lady Deutscher 12/06/2019, 3:06 PM  [place] [facility] [address] [place], [place], [address] Phone: [phone]   Fax:  [phone]  Name: HURMAN KETELSEN MRN: [id number] Date of Birth: [birth year]

## 2019-12-14 ENCOUNTER — Ambulatory Visit: Payer: Medicare Other | Admitting: Physical Therapy

## 2019-12-14 ENCOUNTER — Other Ambulatory Visit: Payer: Self-pay

## 2019-12-14 ENCOUNTER — Encounter: Payer: Self-pay | Admitting: Physical Therapy

## 2019-12-14 DIAGNOSIS — R2681 Unsteadiness on feet: Secondary | ICD-10-CM | POA: Diagnosis not present

## 2019-12-14 DIAGNOSIS — R42 Dizziness and giddiness: Secondary | ICD-10-CM

## 2019-12-14 NOTE — Therapy (Addendum)
[place] [facility] [address] [place], [place], [address] Phone: [phone]   Fax:  [phone]  Physical Therapy Treatment  Patient Details  Name: Timothy Garrison MRN: [id number] Date of Birth: [birth year] Referring Provider (PT): Dr. Jennings Books   Encounter Date: 12/14/2019  PT End of Session - 12/14/19 1545    Visit Number  7    Number of Visits  9    Date for PT Re-Evaluation  12/25/19    PT Start Time  1501    PT Stop Time  2952    PT Time Calculation (min)  43 min    Equipment Utilized During Treatment  Gait belt    Activity Tolerance  Patient tolerated treatment well    Behavior During Therapy  [facility] for tasks assessed/performed       Past Medical History:  Diagnosis Date  . Basal cell carcinoma (BCC) of back   . Basal cell carcinoma (BCC) of nasal sidewall   . Dizziness   . GERD (gastroesophageal reflux disease)   . H/O blood clots   . Hypercholesterolemia   . Hypertension    hx of HBP readings  . Metastatic squamous cell carcinoma ([place]) 2018   bx cervical lymph node  . Vascular abnormality   . VTE (venous thromboembolism) 2018   associated with chemotherapy    Past Surgical History:  Procedure Laterality Date  . COLONOSCOPY    . ESOPHAGOGASTRODUODENOSCOPY (EGD) WITH PROPOFOL N/A 05/24/2015   Procedure: ESOPHAGOGASTRODUODENOSCOPY (EGD) WITH PROPOFOL;  Surgeon: Manya Silvas, MD;  Location: [facility] ENDOSCOPY;  Service: Endoscopy;  Laterality: N/A;  . IR IMAGING GUIDED PORT INSERTION  12/09/2016  . SAVORY DILATION N/A 05/24/2015   Procedure: SAVORY DILATION;  Surgeon: Manya Silvas, MD;  Location: [facility] ENDOSCOPY;  Service: Endoscopy;  Laterality: N/A;    There were no vitals filed for this visit.  Subjective Assessment - 12/15/19 1300    Subjective  Patient reports he has been doing the upper trap stretches and neck exercises and he states that he feels this is helping to keep his neck from getting so tight.  Patient reports that he feels like his dizziness is starting get better and it is not occurring as often. Patient reports that he fell stepped of his porch this morining and rolled his right ankle.    Pertinent History  Patient reports that he had a seizure in October 2020. Patient reports he had sensation of lights in his eyes, then was leaning and had difficulty walking, nausea and then shaking of his body. Patient reports he was taken to the hospital for care for unprovoked convulsive seizure in patient with right lateral temporal encephalomalacia per MR. Patient had CTA, MRI brain, EEG, ECHO, US carotids performed per MR. MRI reports per MR stated: Encephalomalacia of the right temporal lobe is new since 2013 and likely a sequela of a remote ischemic stroke. Per MR, CTA of the head revealed: the proximal Left ICA stenosis appears high-grade, and therefore may be numerically underestimated at 60%. Patient reports he is on [place] now and reports he has not had another seizure since October. Patient reports he had dizziness at the time of the seizure, but that he first started getting dizziness again about 6 to 8 months ago. Patient states he has tried massage therapy and chiropractic care, but states this has not improved his dizziness, but has improved his neck motion. Patient reports he was seen for dizziness by this Probation officer about  5 to 6 years ago and states that the vestibular therapy helped. Patient reports that his dizziness was very manageable and he could work after the vestibular therapy 5-6 years ago. Patient states he still does some of the exercises from his HEP each day. Patient describes his dizziness as imbalance and unstable feeling and denies veritgo. Patient reports he is getting dizziness daily, sometimes multiple times per day. Patient reports the dizziness lasts a minute or two. Washing his hair, looking up under a car, getting up quickly, bending over all bring on his dizziness. Patient  reports sitting still makes it better. Patient reports he sees Dr. Tami Ribas every six months for throat cancer follow-ups and he has also been seen for the dizziness in the past.    Diagnostic tests  MRI brian CTA, EEG, ECHO, US carotids    Patient Stated Goals  to have decreased dizziness       Neuromuscular Re-education:  Body Wall Rolls:  Patient performed 5 reps of unsupported, body wall rolls with eyes closed in gym with CGA.  Quick Turns:  Patient performed 10 reps times 2 of walking 10' with alternating quick turns left and right.  Patient reports mild dizziness with quick turning.  Patient performed retro ambulation with quick turns 5 reps.  Ambulation with head turns:  Patient performed 100' trials of retro ambulation with horizontal and vertical head turns with CGA.   Patient demonstrates no veering with retro ambulation and maintained fair cadence.   Ball toss over shoulder: Patient performed multiple 100' trials retro ambulation while tossing ball over one shoulder with return catch over opposite shoulder varying the ball position to head, shoulder and waist level to promote head turning and tilting with CGA. Patient demonstrates no veering with retro ambulation and maintained fair cadence.   Diona Foley toss to self:  Patient performed retro ambulation 100' reps while tossing ball to self horizontally while tracking ball with head and eyes and then vertically with CGA.  Patient demonstrates no veering with retro ambulation and maintained fair cadence.   Airex balance beam:  Patient stood on Airex balance beam and practice sidestepping along beam while bending forward to retrieve cones off floor and turned to place targets on the other side of the beam on the floor. Performed several reps of this activity. Patient required CGA with this activity and patient denies dizziness, but reports difficulty with balance.  Reviewed upper trap stretches, shoulder rolls and shoulder shrugs and  patient demonstrated.  Patient reports he has been doing the upper trap stretches and neck exercises and he states that he feels this is helping to keep his neck from getting so tight. Patient reports that he feels like his dizziness is starting get better and it is not occurring as often.  Inspected right ankle and no swelling or bruising noted.      PT Education - 12/15/19 1302    Education Details  reviewed HEP and discussed plan of care    Person(s) Educated  Patient    Methods  Explanation    Comprehension  Verbalized understanding       PT Short Term Goals - 11/15/19 1459      PT SHORT TERM GOAL #1   Title  Patient will be able to perform home program independently for self-management.    Time  4    Period  Weeks    Status  Achieved    Target Date  11/27/19        PT  Long Term Goals - 11/29/19 1353      PT LONG TERM GOAL #1   Title  Patient will reduce falls risk as indicated by Activities Specific Balance Confidence Scale (ABC) >67%.    Baseline  scored 56.3% on 10/30/19; scored   64.4% on 11/29/19    Time  8    Period  Weeks    Status  Partially Met      PT LONG TERM GOAL #2   Title  Patient will reduce perceived disability to low levels as indicated by <40 on Dizziness Handicap Inventory.    Baseline  scored 66/100 on 10/30/19; scored  40/100 on 11/29/19    Time  8    Period  Weeks    Status  Partially Met      PT LONG TERM GOAL #3   Title  Patient will have an improvement on FOTO score of 10 points or more in order to demonstrate improvements in patient's balance and strength in order for better functional independence with ADLs, work and hobby activities.    Baseline  scored 50/100 on FOTO; scored 56 /100 on 11/29/19    Time  8    Period  Weeks    Status  Partially Met      PT LONG TERM GOAL #4   Title  Patient will demonstrate reduced falls risk as evidenced by Dynamic Gait Index (DGI) 21/24 or greater.    Baseline  scored 18/24 on 3/21; scored  23/24 on  11/29/19    Time  8    Period  Weeks    Status  Achieved            Plan - 12/15/19 1316    Clinical Impression Statement  Patient progressing well with therapy and patient reports that his symptoms have been improving. Patient did report mild diziness iwth quick turns this date, but able to perform retro ambulation activities with markedly decreased symptoms this week without veering. Patient reports compliance wiht home exercise program and reports the stretches and shoulder/neck exercises added last visit have helped to decrease his neck tension. Will plan on repeating functional outcome measures next visit.    Personal Factors and Comorbidities  Comorbidity 3+;Past/Current Experience;Time since onset of injury/illness/exacerbation    Comorbidities  HTN, hyperlipediemia, seizure disorder, peripheral neuropathy, neck pain, CVA    Examination-Activity Limitations  Bend;Other   showering   Examination-Participation Restrictions  Other   working on cars   Stability/Clinical Decision Making  Evolving/Moderate complexity    Rehab Potential  Good    PT Frequency  1x / week    PT Duration  8 weeks    PT Treatment/Interventions  Canalith Repostioning;Gait training;Stair training;Therapeutic activities;Therapeutic exercise;Balance training;Neuromuscular re-education;Patient/family education;Vestibular;Manual techniques    PT Next Visit Plan  review VOR X 1 try standing; ambulation with head turns, Airex pad with head turns, step taps    PT Home Exercise Plan  VOR X 1 in sitting; Access Code: [email or web address]: [email or web address]: 04/21/2021Prepared by: Melchor Amour MurphyExercisesSeated Cervical Retraction Protraction AROM - 1 x daily - 7 x weekly - 1 sets - 10 repsSeated Shoulder Shrug - 1 x daily - 7 x weekly - 1 sets - 10 repsSeated Shoulder Rolls - 1 x daily - 7 x weekly - 1 sets - 10 repsSeated Upper Trapezius Stretch - 1 x daily - 7 x weekly - 1 sets - 5 reps - 30-60 seconds hold     Consulted and Agree with Plan of Care  Patient  Patient will benefit from skilled therapeutic intervention in order to improve the following deficits and impairments:  Difficulty walking, Dizziness, Decreased balance, Decreased mobility  Visit Diagnosis: Dizziness and giddiness  Unsteadiness on feet     Problem List Patient Active Problem List   Diagnosis Date Noted  . Embolism and thrombosis of unspecified parts of aorta ([place]) 08/29/2019  . Seizure disorder ([place]) 06/04/2019  . Neurologic abnormality 05/23/2019  . Sepsis due to undetermined organism with metabolic encephalopathy ([place]) 05/23/2019  . Dysphagia 04/25/2019  . Anemia 04/25/2019  . Left shoulder pain 05/29/2018  . Fatigue 02/06/2018  . Atherosclerotic PVD with intermittent claudication ([place]) 12/13/2017  . History of radiation to head and neck region 12/13/2017  . Aortic occlusion ([place]) 04/26/2017  . Xerostomia 04/26/2017  . Peripheral neuropathy due to ischemia 04/12/2017  . Absent pedal pulses 01/09/2017  . Claudication ([place]) 01/09/2017  . Goals of care, counseling/discussion 12/25/2016  . History of malignant neoplasm of oropharynx 12/07/2016  . Neck mass 10/07/2016  . Chest pain 02/07/2015  . Health care maintenance 11/03/2014  . Daytime somnolence 04/15/2014  . DDD (degenerative disc disease), cervical 02/07/2014  . Myofascial pain 02/07/2014  . Basal cell carcinoma of face 10/24/2013  . Neck pain 09/16/2013  . GERD (gastroesophageal reflux disease) 01/23/2013  . Dizziness 01/23/2013  . Essential hypertension, benign 01/17/2013  . Hypercholesterolemia 01/17/2013  . History of migraine headaches 02/16/2011   Lady Deutscher PT, DPT [phone] Lady Deutscher 12/15/2019, 1:21 PM  [place] [facility] [address] [place], [place], [address] Phone: [phone]   Fax:  [phone]  Name: Timothy Garrison MRN: [id number] Date of Birth: 02-17-1961

## 2019-12-20 ENCOUNTER — Ambulatory Visit: Payer: Medicare Other | Attending: Neurology | Admitting: Physical Therapy

## 2019-12-20 ENCOUNTER — Encounter: Payer: Self-pay | Admitting: Physical Therapy

## 2019-12-20 ENCOUNTER — Other Ambulatory Visit: Payer: Self-pay

## 2019-12-20 DIAGNOSIS — Z79899 Other long term (current) drug therapy: Secondary | ICD-10-CM | POA: Diagnosis not present

## 2019-12-20 DIAGNOSIS — I214 Non-ST elevation (NSTEMI) myocardial infarction: Secondary | ICD-10-CM | POA: Diagnosis not present

## 2019-12-20 DIAGNOSIS — E7800 Pure hypercholesterolemia, unspecified: Secondary | ICD-10-CM | POA: Diagnosis not present

## 2019-12-20 DIAGNOSIS — I739 Peripheral vascular disease, unspecified: Secondary | ICD-10-CM | POA: Diagnosis not present

## 2019-12-20 DIAGNOSIS — R2681 Unsteadiness on feet: Secondary | ICD-10-CM

## 2019-12-20 DIAGNOSIS — D649 Anemia, unspecified: Secondary | ICD-10-CM | POA: Diagnosis not present

## 2019-12-20 DIAGNOSIS — I2510 Atherosclerotic heart disease of native coronary artery without angina pectoris: Secondary | ICD-10-CM | POA: Diagnosis not present

## 2019-12-20 DIAGNOSIS — Z7901 Long term (current) use of anticoagulants: Secondary | ICD-10-CM | POA: Diagnosis not present

## 2019-12-20 DIAGNOSIS — Z9221 Personal history of antineoplastic chemotherapy: Secondary | ICD-10-CM | POA: Diagnosis not present

## 2019-12-20 DIAGNOSIS — Z03818 Encounter for observation for suspected exposure to other biological agents ruled out: Secondary | ICD-10-CM | POA: Diagnosis not present

## 2019-12-20 DIAGNOSIS — R531 Weakness: Secondary | ICD-10-CM | POA: Diagnosis not present

## 2019-12-20 DIAGNOSIS — Z20822 Contact with and (suspected) exposure to covid-19: Secondary | ICD-10-CM | POA: Diagnosis not present

## 2019-12-20 DIAGNOSIS — R42 Dizziness and giddiness: Secondary | ICD-10-CM | POA: Insufficient documentation

## 2019-12-20 DIAGNOSIS — R569 Unspecified convulsions: Secondary | ICD-10-CM | POA: Diagnosis not present

## 2019-12-20 DIAGNOSIS — K219 Gastro-esophageal reflux disease without esophagitis: Secondary | ICD-10-CM | POA: Diagnosis not present

## 2019-12-20 DIAGNOSIS — Z87891 Personal history of nicotine dependence: Secondary | ICD-10-CM | POA: Diagnosis not present

## 2019-12-20 DIAGNOSIS — R55 Syncope and collapse: Secondary | ICD-10-CM | POA: Diagnosis not present

## 2019-12-20 DIAGNOSIS — I359 Nonrheumatic aortic valve disorder, unspecified: Secondary | ICD-10-CM | POA: Diagnosis not present

## 2019-12-20 DIAGNOSIS — I10 Essential (primary) hypertension: Secondary | ICD-10-CM | POA: Diagnosis not present

## 2019-12-20 DIAGNOSIS — D72829 Elevated white blood cell count, unspecified: Secondary | ICD-10-CM | POA: Diagnosis not present

## 2019-12-20 DIAGNOSIS — I700 Atherosclerosis of aorta: Secondary | ICD-10-CM | POA: Diagnosis not present

## 2019-12-20 DIAGNOSIS — Z85828 Personal history of other malignant neoplasm of skin: Secondary | ICD-10-CM | POA: Diagnosis not present

## 2019-12-20 DIAGNOSIS — E785 Hyperlipidemia, unspecified: Secondary | ICD-10-CM | POA: Diagnosis not present

## 2019-12-20 DIAGNOSIS — Z86718 Personal history of other venous thrombosis and embolism: Secondary | ICD-10-CM | POA: Diagnosis not present

## 2019-12-20 DIAGNOSIS — Z85818 Personal history of malignant neoplasm of other sites of lip, oral cavity, and pharynx: Secondary | ICD-10-CM | POA: Diagnosis not present

## 2019-12-20 NOTE — Therapy (Signed)
[facility] [address] [place], [place], [address] Phone: [phone]   Fax:  [phone]  Physical Therapy Treatment/Discharge Summary  Dates of Service: 10/30/19-12/20/19 Total Number of Visits: 8  Patient Details  Name: Timothy Garrison MRN: [id number] Date of Birth: 1961/03/08 Referring Provider (PT): Dr. Jennings Books   Encounter Date: 12/20/2019  PT End of Session - 12/20/19 1424    Visit Number  8    Number of Visits  9    Date for PT Re-Evaluation  12/25/19    PT Start Time  1424    PT Stop Time  1458    PT Time Calculation (min)  34 min    Equipment Utilized During Treatment  Gait belt    Activity Tolerance  Patient tolerated treatment well    Behavior During Therapy  [facility] for tasks assessed/performed       Past Medical History:  Diagnosis Date  . Basal cell carcinoma (BCC) of back   . Basal cell carcinoma (BCC) of nasal sidewall   . Dizziness   . GERD (gastroesophageal reflux disease)   . H/O blood clots   . Hypercholesterolemia   . Hypertension    hx of HBP readings  . Metastatic squamous cell carcinoma ([place]) 2018   bx cervical lymph node  . Vascular abnormality   . VTE (venous thromboembolism) 2018   associated with chemotherapy    Past Surgical History:  Procedure Laterality Date  . COLONOSCOPY    . ESOPHAGOGASTRODUODENOSCOPY (EGD) WITH PROPOFOL N/A 05/24/2015   Procedure: ESOPHAGOGASTRODUODENOSCOPY (EGD) WITH PROPOFOL;  Surgeon: Manya Silvas, MD;  Location: [facility] ENDOSCOPY;  Service: Endoscopy;  Laterality: N/A;  . IR IMAGING GUIDED PORT INSERTION  12/09/2016  . SAVORY DILATION N/A 05/24/2015   Procedure: SAVORY DILATION;  Surgeon: Manya Silvas, MD;  Location: [facility] ENDOSCOPY;  Service: Endoscopy;  Laterality: N/A;    There were no vitals filed for this visit.  Subjective Assessment - 12/20/19 1427    Subjective  Patient states he has had no more "wobbly" episodes this past week and that he has  been doing some work on cars.    Pertinent History  Patient reports that he had a seizure in October 2020. Patient reports he had sensation of lights in his eyes, then was leaning and had difficulty walking, nausea and then shaking of his body. Patient reports he was taken to the hospital for care for unprovoked convulsive seizure in patient with right lateral temporal encephalomalacia per MR. Patient had CTA, MRI brain, EEG, ECHO, US carotids performed per MR. MRI reports per MR stated: Encephalomalacia of the right temporal lobe is new since 2013 and likely a sequela of a remote ischemic stroke. Per MR, CTA of the head revealed: the proximal Left ICA stenosis appears high-grade, and therefore may be numerically underestimated at 60%. Patient reports he is on [place] now and reports he has not had another seizure since October. Patient reports he had dizziness at the time of the seizure, but that he first started getting dizziness again about 6 to 8 months ago. Patient states he has tried massage therapy and chiropractic care, but states this has not improved his dizziness, but has improved his neck motion. Patient reports he was seen for dizziness by this writer about 5 to 6 years ago and states that the vestibular therapy helped. Patient reports that his dizziness was very manageable and he could work after the vestibular therapy 5-6 years ago. Patient  states he still does some of the exercises from his HEP each day. Patient describes his dizziness as imbalance and unstable feeling and denies veritgo. Patient reports he is getting dizziness daily, sometimes multiple times per day. Patient reports the dizziness lasts a minute or two. Washing his hair, looking up under a car, getting up quickly, bending over all bring on his dizziness. Patient reports sitting still makes it better. Patient reports he sees Dr. Tami Ribas every six months for throat cancer follow-ups and he has also been seen for the dizziness in the  past.    Diagnostic tests  MRI brian CTA, EEG, ECHO, US carotids    Patient Stated Goals  to have decreased dizziness       FUNCTIONAL OUTCOME MEASURES:  Results Comments  DHI 28/100 Low perception of handicap  ABC Scale 72.5% Decreased falls risk  FOTO 60/100 Demonstrated 10 points of improvement as compared to initial testing   Quick Turns:  Patient performed 10 reps times of walking 8' with alternating quick turns left and right.  Patient reports mild sensation of dizziness especially with initial turns, but reports this improved with practice.  Discussed how patient could do this safely at home walking in a hallway.  Issued for HEP.   Ambulation with head turns:  Patient performed 200' retro ambulation while scanning for targets in hallway with CGA.   Ball toss over shoulder: Patient performed multiple100' trials of retro ambulation while tossing ball over one shoulder with return catch over opposite shoulder varying the ball position to head, shoulder and waist level to promote head turning and tilting with CGA.  Patient demonstrates fair speed and no veering with this activity.  Patient denies dizziness except states that he had mild sensation of dizziness when he would turn around between trials.    PT Education - 12/20/19 1425    Education Details  discussed functional outcome measures and progress towards goals; discussed discharge plans; issued alternating quick turns in hallway for HEP.    Person(s) Educated  Patient    Methods  Explanation;Handout;Demonstration    Comprehension  Verbalized understanding;Returned demonstration       PT Short Term Goals - 11/15/19 1459      PT SHORT TERM GOAL #1   Title  Patient will be able to perform home program independently for self-management.    Time  4    Period  Weeks    Status  Achieved    Target Date  11/27/19        PT Long Term Goals - 12/20/19 1435      PT LONG TERM GOAL #1   Title  Patient will reduce falls  risk as indicated by Activities Specific Balance Confidence Scale (ABC) >67%.    Baseline  scored 56.3% on 10/30/19; scored 64.4% on 11/29/19; scored 72.5% on 12/20/19    Time  8    Period  Weeks    Status  Achieved      PT LONG TERM GOAL #2   Title  Patient will reduce perceived disability to low levels as indicated by <40 on Dizziness Handicap Inventory.    Baseline  scored 66/100 on 10/30/19; scored 40/100 on 11/29/19; scored 28/100 on 12/20/19    Time  8    Period  Weeks    Status  Achieved      PT LONG TERM GOAL #3   Title  Patient will have an improvement on FOTO score of 10 points or more in order to  demonstrate improvements in patient's balance and strength in order for better functional independence with ADLs, work and hobby activities.    Baseline  scored 50/100 on FOTO; scored 56 /100 on 11/29/19; scored 60/100 on 12/20/19    Time  8    Period  Weeks    Status  Achieved      PT LONG TERM GOAL #4   Title  Patient will demonstrate reduced falls risk as evidenced by Dynamic Gait Index (DGI) 21/24 or greater.    Baseline  scored 18/24 on 3/21; scored 23/24 on 11/29/19    Time  8    Period  Weeks    Status  Achieved            Plan - 12/21/19 1116    Clinical Impression Statement  Patient reports that his symptoms have continued to gradually improve and that he did not have any episodes of dizziness this past week. Repeated functional outcome testing and patient improved from 56% to 72.5% on the ABC scale. Patient improved from 66/100 moderate perception down to 28/100 low perception of handicap on the [facility]. Patient improved from 50/100 to 60/100 on the FOTO score indicating improvements in patient's functional status. Patient has met all goals as set on plan of care. Patient in agreement with discharge from PT services at this time. Patient plans on continuing to perform HEP upon discharge. Patient is discharged from PT services at this time with all goals met.    Personal Factors and  Comorbidities  Comorbidity 3+;Past/Current Experience;Time since onset of injury/illness/exacerbation    Comorbidities  HTN, hyperlipediemia, seizure disorder, peripheral neuropathy, neck pain, CVA    Examination-Activity Limitations  Bend;Other   showering   Examination-Participation Restrictions  Other   working on cars   Stability/Clinical Decision Making  Evolving/Moderate complexity    Rehab Potential  Good    PT Frequency  1x / week    PT Duration  8 weeks    PT Treatment/Interventions  [place] Repostioning;Gait training;Stair training;Therapeutic activities;Therapeutic exercise;Balance training;Neuromuscular re-education;Patient/family education;Vestibular;Manual techniques    PT Next Visit Plan  review VOR X 1 try standing; ambulation with head turns, Airex pad with head turns, step taps    PT Home Exercise Plan  VOR X 1 in sitting; Access Code: [email or web address]: [email or web address]: 04/21/2021Prepared by: Melchor Amour MurphyExercisesSeated Cervical Retraction Protraction AROM - 1 x daily - 7 x weekly - 1 sets - 10 repsSeated Shoulder Shrug - 1 x daily - 7 x weekly - 1 sets - 10 repsSeated Shoulder Rolls - 1 x daily - 7 x weekly - 1 sets - 10 repsSeated Upper Trapezius Stretch - 1 x daily - 7 x weekly - 1 sets - 5 reps - 30-60 seconds hold    Consulted and Agree with Plan of Care  Patient       Patient will benefit from skilled therapeutic intervention in order to improve the following deficits and impairments:  Difficulty walking, Dizziness, Decreased balance, Decreased mobility  Visit Diagnosis: Dizziness and giddiness  Unsteadiness on feet     Problem List Patient Active Problem List   Diagnosis Date Noted  . Embolism and thrombosis of unspecified parts of aorta ([place]) 08/29/2019  . Seizure disorder ([place]) 06/04/2019  . Neurologic abnormality 05/23/2019  . Sepsis due to undetermined organism with metabolic encephalopathy ([place]) 05/23/2019  . Dysphagia 04/25/2019   . Anemia 04/25/2019  . Left shoulder pain 05/29/2018  . Fatigue 02/06/2018  . Atherosclerotic PVD with intermittent claudication ([place]) 12/13/2017  .  History of radiation to head and neck region 12/13/2017  . Aortic occlusion ([place]) 04/26/2017  . Xerostomia 04/26/2017  . Peripheral neuropathy due to ischemia 04/12/2017  . Absent pedal pulses 01/09/2017  . Claudication ([place]) 01/09/2017  . Goals of care, counseling/discussion 12/25/2016  . History of malignant neoplasm of oropharynx 12/07/2016  . Neck mass 10/07/2016  . Chest pain 02/07/2015  . Health care maintenance 11/03/2014  . Daytime somnolence 04/15/2014  . DDD (degenerative disc disease), cervical 02/07/2014  . Myofascial pain 02/07/2014  . Basal cell carcinoma of face 10/24/2013  . Neck pain 09/16/2013  . GERD (gastroesophageal reflux disease) 01/23/2013  . Dizziness 01/23/2013  . Essential hypertension, benign 01/17/2013  . Hypercholesterolemia 01/17/2013  . History of migraine headaches 02/16/2011   Lady Deutscher PT, DPT [phone] Lady Deutscher 12/21/2019, 11:22 AM  [facility] [address] [place], [place], [address] Phone: [phone]   Fax:  [phone]  Name: Timothy Garrison MRN: [id number] Date of Birth: [birth year]

## 2019-12-23 ENCOUNTER — Inpatient Hospital Stay
Admission: EM | Admit: 2019-12-23 | Discharge: 2019-12-25 | DRG: 282 | Disposition: A | Discharge status: Home / Self Care | Payer: Medicare Other | Attending: Internal Medicine | Admitting: Internal Medicine

## 2019-12-23 ENCOUNTER — Other Ambulatory Visit: Payer: Self-pay

## 2019-12-23 ENCOUNTER — Emergency Department: Payer: Medicare Other

## 2019-12-23 DIAGNOSIS — I739 Peripheral vascular disease, unspecified: Secondary | ICD-10-CM | POA: Diagnosis present

## 2019-12-23 DIAGNOSIS — Z7901 Long term (current) use of anticoagulants: Secondary | ICD-10-CM

## 2019-12-23 DIAGNOSIS — Z85818 Personal history of malignant neoplasm of other sites of lip, oral cavity, and pharynx: Secondary | ICD-10-CM

## 2019-12-23 DIAGNOSIS — Z9221 Personal history of antineoplastic chemotherapy: Secondary | ICD-10-CM

## 2019-12-23 DIAGNOSIS — R55 Syncope and collapse: Secondary | ICD-10-CM | POA: Diagnosis not present

## 2019-12-23 DIAGNOSIS — D649 Anemia, unspecified: Secondary | ICD-10-CM | POA: Diagnosis present

## 2019-12-23 DIAGNOSIS — Z85828 Personal history of other malignant neoplasm of skin: Secondary | ICD-10-CM | POA: Diagnosis not present

## 2019-12-23 DIAGNOSIS — G40909 Epilepsy, unspecified, not intractable, without status epilepticus: Secondary | ICD-10-CM

## 2019-12-23 DIAGNOSIS — I2510 Atherosclerotic heart disease of native coronary artery without angina pectoris: Secondary | ICD-10-CM | POA: Diagnosis present

## 2019-12-23 DIAGNOSIS — Z20822 Contact with and (suspected) exposure to covid-19: Secondary | ICD-10-CM | POA: Diagnosis present

## 2019-12-23 DIAGNOSIS — E7800 Pure hypercholesterolemia, unspecified: Secondary | ICD-10-CM | POA: Diagnosis present

## 2019-12-23 DIAGNOSIS — K219 Gastro-esophageal reflux disease without esophagitis: Secondary | ICD-10-CM | POA: Diagnosis present

## 2019-12-23 DIAGNOSIS — I10 Essential (primary) hypertension: Secondary | ICD-10-CM | POA: Diagnosis not present

## 2019-12-23 DIAGNOSIS — R410 Disorientation, unspecified: Secondary | ICD-10-CM | POA: Diagnosis not present

## 2019-12-23 DIAGNOSIS — Z86718 Personal history of other venous thrombosis and embolism: Secondary | ICD-10-CM

## 2019-12-23 DIAGNOSIS — I700 Atherosclerosis of aorta: Secondary | ICD-10-CM | POA: Diagnosis not present

## 2019-12-23 DIAGNOSIS — Z79899 Other long term (current) drug therapy: Secondary | ICD-10-CM

## 2019-12-23 DIAGNOSIS — Z743 Need for continuous supervision: Secondary | ICD-10-CM | POA: Diagnosis not present

## 2019-12-23 DIAGNOSIS — Z87891 Personal history of nicotine dependence: Secondary | ICD-10-CM

## 2019-12-23 DIAGNOSIS — D72829 Elevated white blood cell count, unspecified: Secondary | ICD-10-CM | POA: Diagnosis present

## 2019-12-23 DIAGNOSIS — R110 Nausea: Secondary | ICD-10-CM | POA: Diagnosis not present

## 2019-12-23 DIAGNOSIS — I359 Nonrheumatic aortic valve disorder, unspecified: Secondary | ICD-10-CM | POA: Diagnosis not present

## 2019-12-23 DIAGNOSIS — I252 Old myocardial infarction: Secondary | ICD-10-CM | POA: Diagnosis present

## 2019-12-23 DIAGNOSIS — I214 Non-ST elevation (NSTEMI) myocardial infarction: Principal | ICD-10-CM | POA: Diagnosis present

## 2019-12-23 DIAGNOSIS — E785 Hyperlipidemia, unspecified: Secondary | ICD-10-CM | POA: Diagnosis present

## 2019-12-23 DIAGNOSIS — R531 Weakness: Secondary | ICD-10-CM | POA: Diagnosis not present

## 2019-12-23 DIAGNOSIS — Z03818 Encounter for observation for suspected exposure to other biological agents ruled out: Secondary | ICD-10-CM | POA: Diagnosis not present

## 2019-12-23 DIAGNOSIS — R569 Unspecified convulsions: Secondary | ICD-10-CM | POA: Diagnosis not present

## 2019-12-23 DIAGNOSIS — R000 Tachycardia, unspecified: Secondary | ICD-10-CM | POA: Diagnosis not present

## 2019-12-23 LAB — CBC WITH DIFFERENTIAL/PLATELET
Abs Immature Granulocytes: 0.07 10*3/uL (ref 0.00–0.07)
Basophils Absolute: 0 10*3/uL (ref 0.0–0.1)
Basophils Relative: 0 %
Eosinophils Absolute: 0.1 10*3/uL (ref 0.0–0.5)
Eosinophils Relative: 1 %
HCT: 38.5 % — ABNORMAL LOW (ref 39.0–52.0)
Hemoglobin: 12.8 g/dL — ABNORMAL LOW (ref 13.0–17.0)
Immature Granulocytes: 0 %
Lymphocytes Relative: 7 %
Lymphs Abs: 1.2 10*3/uL (ref 0.7–4.0)
MCH: 28 pg (ref 26.0–34.0)
MCHC: 33.2 g/dL (ref 30.0–36.0)
MCV: 84.2 fL (ref 80.0–100.0)
Monocytes Absolute: 0.9 10*3/uL (ref 0.1–1.0)
Monocytes Relative: 5 %
Neutro Abs: 14.2 10*3/uL — ABNORMAL HIGH (ref 1.7–7.7)
Neutrophils Relative %: 87 %
Platelets: 288 10*3/uL (ref 150–400)
RBC: 4.57 MIL/uL (ref 4.22–5.81)
RDW: 13.5 % (ref 11.5–15.5)
WBC: 16.5 10*3/uL — ABNORMAL HIGH (ref 4.0–10.5)
nRBC: 0 % (ref 0.0–0.2)

## 2019-12-23 LAB — URINE DRUG SCREEN, QUALITATIVE (ARMC ONLY)
Amphetamines, Ur Screen: NOT DETECTED
Barbiturates, Ur Screen: NOT DETECTED
Benzodiazepine, Ur Scrn: NOT DETECTED
Cannabinoid 50 Ng, Ur ~~LOC~~: NOT DETECTED
Cocaine Metabolite,Ur ~~LOC~~: NOT DETECTED
MDMA (Ecstasy)Ur Screen: NOT DETECTED
Methadone Scn, Ur: NOT DETECTED
Opiate, Ur Screen: NOT DETECTED
Phencyclidine (PCP) Ur S: NOT DETECTED
Tricyclic, Ur Screen: NOT DETECTED

## 2019-12-23 LAB — URINALYSIS, ROUTINE W REFLEX MICROSCOPIC
Bacteria, UA: NONE SEEN
Bilirubin Urine: NEGATIVE
Glucose, UA: NEGATIVE mg/dL
Hgb urine dipstick: NEGATIVE
Ketones, ur: NEGATIVE mg/dL
Leukocytes,Ua: NEGATIVE
Nitrite: NEGATIVE
Protein, ur: NEGATIVE mg/dL
Specific Gravity, Urine: 1.015 (ref 1.005–1.030)
Squamous Epithelial / HPF: NONE SEEN (ref 0–5)
pH: 5 (ref 5.0–8.0)

## 2019-12-23 LAB — COMPREHENSIVE METABOLIC PANEL
ALT: 26 U/L (ref 0–44)
AST: 34 U/L (ref 15–41)
Albumin: 4.1 g/dL (ref 3.5–5.0)
Alkaline Phosphatase: 59 U/L (ref 38–126)
Anion gap: 9 (ref 5–15)
BUN: 15 mg/dL (ref 6–20)
CO2: 22 mmol/L (ref 22–32)
Calcium: 8.9 mg/dL (ref 8.9–10.3)
Chloride: 105 mmol/L (ref 98–111)
Creatinine, Ser: 1.09 mg/dL (ref 0.61–1.24)
GFR calc Af Amer: >60 mL/min (ref >60)
GFR calc non Af Amer: >60 mL/min (ref >60)
Glucose, Bld: 141 mg/dL — ABNORMAL HIGH (ref 70–99)
Potassium: 4 mmol/L (ref 3.5–5.1)
Sodium: 136 mmol/L (ref 135–145)
Total Bilirubin: 0.6 mg/dL (ref 0.3–1.2)
Total Protein: 7.6 g/dL (ref 6.5–8.1)

## 2019-12-23 LAB — RESPIRATORY PANEL BY RT PCR (FLU A&B, COVID)
Influenza A by PCR: NEGATIVE
Influenza B by PCR: NEGATIVE
SARS Coronavirus 2 by RT PCR: NEGATIVE

## 2019-12-23 LAB — PROTIME-INR
INR: 1.1 (ref 0.8–1.2)
Prothrombin Time: 13.5 seconds (ref 11.4–15.2)

## 2019-12-23 LAB — APTT: aPTT: 29 seconds (ref 24–36)

## 2019-12-23 LAB — TROPONIN I (HIGH SENSITIVITY)
Troponin I (High Sensitivity): 189 ng/L (ref <18)
Troponin I (High Sensitivity): 598 ng/L (ref <18)

## 2019-12-23 LAB — ETHANOL: Alcohol, Ethyl (B): <10 mg/dL (ref <10)

## 2019-12-23 MED ORDER — PRAVASTATIN SODIUM 40 MG PO TABS: 40.0000 mg | ORAL_TABLET | Freq: Every day | ORAL | Status: DC

## 2019-12-23 MED ORDER — PANTOPRAZOLE SODIUM 40 MG PO TBEC
40.0000 mg | DELAYED_RELEASE_TABLET | Freq: Every day | ORAL | Status: DC
  Administered 2019-12-24: 40 mg via ORAL
  Filled 2019-12-23 (×2): qty 1

## 2019-12-23 MED ORDER — ADULT MULTIVITAMIN W/MINERALS CH
1.0000 | ORAL_TABLET | Freq: Every day | ORAL | Status: DC
  Administered 2019-12-24 – 2019-12-25 (×2): 1 via ORAL
  Filled 2019-12-23 (×2): qty 1

## 2019-12-23 MED ORDER — NYSTATIN 100000 UNIT/ML MT SUSP: 5.0000 mL | Freq: Four times a day (QID) | OROMUCOSAL | Status: DC

## 2019-12-23 MED ORDER — ONDANSETRON HCL 4 MG/2ML IJ SOLN: 4.0000 mg | Freq: Four times a day (QID) | INTRAMUSCULAR | Status: DC | PRN

## 2019-12-23 MED ORDER — ASPIRIN 81 MG PO CHEW
324.0000 mg | CHEWABLE_TABLET | Freq: Once | ORAL | Status: AC
  Administered 2019-12-23: 324 mg via ORAL
  Filled 2019-12-23: qty 4

## 2019-12-23 MED ORDER — ASPIRIN 81 MG PO CHEW
324.0000 mg | CHEWABLE_TABLET | ORAL | Status: AC
  Filled 2019-12-23: qty 4

## 2019-12-23 MED ORDER — HEPARIN BOLUS VIA INFUSION
4000.0000 [IU] | Freq: Once | INTRAVENOUS | Status: AC
  Administered 2019-12-23: 4000 [IU] via INTRAVENOUS
  Filled 2019-12-23: qty 4000

## 2019-12-23 MED ORDER — HEPARIN (PORCINE) 25000 UT/250ML-% IV SOLN
1050.0000 [IU]/h | INTRAVENOUS | Status: DC
  Administered 2019-12-23: 23:00:00 1000 [IU]/h via INTRAVENOUS
  Administered 2019-12-24: 1050 [IU]/h via INTRAVENOUS
  Filled 2019-12-23 (×2): qty 250

## 2019-12-23 MED ORDER — SODIUM CHLORIDE 0.9 % IV BOLUS
1000.0000 mL | Freq: Once | INTRAVENOUS | Status: AC
  Administered 2019-12-23: 1000 mL via INTRAVENOUS

## 2019-12-23 MED ORDER — NITROGLYCERIN 0.4 MG SL SUBL: 0.4000 mg | SUBLINGUAL_TABLET | SUBLINGUAL | Status: DC | PRN

## 2019-12-23 MED ORDER — LEVETIRACETAM 500 MG PO TABS
500.0000 mg | ORAL_TABLET | Freq: Two times a day (BID) | ORAL | Status: DC
  Filled 2019-12-23 (×2): qty 1

## 2019-12-23 MED ORDER — ZOLPIDEM TARTRATE 5 MG PO TABS: 5.0000 mg | ORAL_TABLET | Freq: Every evening | ORAL | Status: DC | PRN

## 2019-12-23 MED ORDER — METOPROLOL TARTRATE 25 MG PO TABS
25.0000 mg | ORAL_TABLET | Freq: Two times a day (BID) | ORAL | Status: DC
  Administered 2019-12-24 (×3): 25 mg via ORAL
  Filled 2019-12-23 (×5): qty 1

## 2019-12-23 MED ORDER — ASPIRIN 300 MG RE SUPP: 300.0000 mg | RECTAL | Status: AC

## 2019-12-23 MED ORDER — LISINOPRIL 20 MG PO TABS
20.0000 mg | ORAL_TABLET | Freq: Every day | ORAL | Status: DC
  Administered 2019-12-24: 10:00:00 20 mg via ORAL
  Filled 2019-12-23: qty 1
  Filled 2019-12-23: qty 2

## 2019-12-23 MED ORDER — ASPIRIN EC 81 MG PO TBEC
81.0000 mg | DELAYED_RELEASE_TABLET | Freq: Every day | ORAL | Status: DC
  Administered 2019-12-24: 10:00:00 81 mg via ORAL
  Filled 2019-12-23: qty 1

## 2019-12-23 MED ORDER — APIXABAN 5 MG PO TABS: 5.0000 mg | ORAL_TABLET | Freq: Two times a day (BID) | ORAL | Status: DC

## 2019-12-23 MED ORDER — SODIUM CHLORIDE 0.9 % IV SOLN: INTRAVENOUS | Status: DC

## 2019-12-23 MED ORDER — ATORVASTATIN CALCIUM 80 MG PO TABS
80.0000 mg | ORAL_TABLET | Freq: Every day | ORAL | Status: DC
  Administered 2019-12-24 (×2): 80 mg via ORAL
  Filled 2019-12-23: qty 1
  Filled 2019-12-23 (×3): qty 4

## 2019-12-23 MED ORDER — ACETAMINOPHEN 325 MG PO TABS: 650.0000 mg | ORAL_TABLET | ORAL | Status: DC | PRN

## 2019-12-23 MED ORDER — ALPRAZOLAM 0.25 MG PO TABS: 0.2500 mg | ORAL_TABLET | Freq: Two times a day (BID) | ORAL | Status: DC | PRN

## 2019-12-23 MED ORDER — IOHEXOL 350 MG/ML SOLN
100.0000 mL | Freq: Once | INTRAVENOUS | Status: AC | PRN
  Administered 2019-12-23: 22:00:00 100 mL via INTRAVENOUS

## 2019-12-23 NOTE — ED Notes (Signed)
Pt transported to CT ?

## 2019-12-23 NOTE — Progress Notes (Addendum)
ANTICOAGULATION CONSULT NOTE - Initial Consult  Pharmacy Consult for heparin Indication: chest pain/ACS  No Known Allergies  Patient Measurements: Height: 5\' 7"  (170.2 cm) Weight: 70.3 kg (155 lb) IBW/kg (Calculated) : 66.1 Heparin Dosing Weight: 70.3 kg  Vital Signs: Temp: 97.7 F (36.5 C) (05/08 1937) Temp Source: Oral (05/08 1937) BP: 134/83 (05/08 2330) Pulse Rate: 95 (05/08 2330)  Labs: Recent Labs    12/23/19 1940 12/23/19 2147  HGB 12.8*  --   HCT 38.5*  --   PLT 288  --   CREATININE 1.09  --   TROPONINIHS 189* 598*    Estimated Creatinine Clearance: 68.2 mL/min (by C-G formula based on SCr of 1.09 mg/dL).   Medical History: Past Medical History:  Diagnosis Date  . Basal cell carcinoma (BCC) of back   . Basal cell carcinoma (BCC) of nasal sidewall   . Dizziness   . GERD (gastroesophageal reflux disease)   . H/O blood clots   . Hypercholesterolemia   . Hypertension    hx of HBP readings  . Metastatic squamous cell carcinoma ([place]) 2018   bx cervical lymph node  . Vascular abnormality   . VTE (venous thromboembolism) 2018   associated with chemotherapy    Medications:  Scheduled:  . aspirin  324 mg Oral NOW   Or  . aspirin  300 mg Rectal NOW  . [START ON 12/24/2019] aspirin EC  81 mg Oral Daily  . atorvastatin  80 mg Oral Daily  . levETIRAcetam  500 mg Oral BID  . [START ON 12/24/2019] lisinopril  20 mg Oral Daily  . metoprolol tartrate  25 mg Oral BID  . [START ON 12/24/2019] multivitamin with minerals  1 tablet Oral Daily  . [START ON 12/24/2019] pantoprazole  40 mg Oral Daily    Assessment: Patient arrives for AMS was found unresponsive and cold w/ h/o PAD, DVT on eliquis PTA, oropharyngeal cancer s/p chemo, and seizures on keppra (not in [place]) presenting to the ED w/ trops of 189 >> 598 w/ EKG showing NSR w/ PVCs, CXR, CT head/angio WNL, baseline CBC WNL, aPTT/INR, anti-Xa pending. Patient's last dose of eliquis 5 mg on 05/08 @ 1000. Patient is being  started on heparin drip for management of NSTEMI.  Goal of Therapy:  APTT 66 - 102 seconds Heparin level 0.3-0.7 units/ml Monitor platelets by anticoagulation protocol: Yes   Plan:  Will bolus heparin 4000 units IV x 1 Will start rate at 1000 units/hr  Will check aPTT at 0500. Will monitor daily CBC's and adjust per aPTTs for now until anti-Xa correlates then will revert to dosing per anti-Xa levels.  Tobie Lords, PharmD, BCPS Clinical Pharmacist 12/23/2019,11:43 PM

## 2019-12-23 NOTE — ED Triage Notes (Signed)
Pt from home via [facility] with complaint of possible seizure. Pt family reports he went out to mow the lawn at approx 1200, and was found inside by sister at approx 1900 disoriented with no memory of yardwork. Pt was nauseas.   Pt has history of seizures and blood clots, is taking Eliquis. Pt reports feeling cold, nauseas, and does not remember most of today.   EMS reports slight T wave elevation

## 2019-12-23 NOTE — ED Provider Notes (Signed)
[facility] Emergency Department Provider Note    First MD Initiated Contact with Patient 12/23/19 1946     (approximate)  I have reviewed the triage vital signs and the nursing notes.   HISTORY  Chief Complaint Altered Mental Status    HPI Timothy Garrison is a 59 y.o. male below listed past medical history with significant PAD as well as remote history of oropharyngeal cancer previously on chemo.  Does have a history of DVTs on Eliquis presents the ER after being found clammy and unresponsive.  Occurred after the patient was mowing his lawn today.  Was having some nausea.  When he was found to be somewhat amnestic to what happened.  Reportedly had a history of seizures and so there is some question as to whether he was having seizure and was in a postictal state however the wife states that it did not look like his seizures.  Patient denies any chest pain or pressure.  No nausea or vomiting.  No recent fevers.    Past Medical History:  Diagnosis Date  . Basal cell carcinoma (BCC) of back   . Basal cell carcinoma (BCC) of nasal sidewall   . Dizziness   . GERD (gastroesophageal reflux disease)   . H/O blood clots   . Hypercholesterolemia   . Hypertension    hx of HBP readings  . Metastatic squamous cell carcinoma ([place]) 2018   bx cervical lymph node  . Vascular abnormality   . VTE (venous thromboembolism) 2018   associated with chemotherapy   Family History  Problem Relation Age of Onset  . Hyperlipidemia Mother   . Heart disease Mother   . Hypertension Mother   . Alzheimer's disease Mother   . Prostate cancer Father   . Stroke Father    Past Surgical History:  Procedure Laterality Date  . COLONOSCOPY    . ESOPHAGOGASTRODUODENOSCOPY (EGD) WITH PROPOFOL N/A 05/24/2015   Procedure: ESOPHAGOGASTRODUODENOSCOPY (EGD) WITH PROPOFOL;  Surgeon: Manya Silvas, MD;  Location: [facility] ENDOSCOPY;  Service: Endoscopy;  Laterality: N/A;  . IR IMAGING GUIDED  PORT INSERTION  12/09/2016  . SAVORY DILATION N/A 05/24/2015   Procedure: SAVORY DILATION;  Surgeon: Manya Silvas, MD;  Location: [facility] ENDOSCOPY;  Service: Endoscopy;  Laterality: N/A;   Patient Active Problem List   Diagnosis Date Noted  . Embolism and thrombosis of unspecified parts of aorta ([place]) 08/29/2019  . Seizure disorder ([place]) 06/04/2019  . Neurologic abnormality 05/23/2019  . Sepsis due to undetermined organism with metabolic encephalopathy ([place]) 05/23/2019  . Dysphagia 04/25/2019  . Anemia 04/25/2019  . Left shoulder pain 05/29/2018  . Fatigue 02/06/2018  . Atherosclerotic PVD with intermittent claudication ([place]) 12/13/2017  . History of radiation to head and neck region 12/13/2017  . Aortic occlusion ([place]) 04/26/2017  . Xerostomia 04/26/2017  . Peripheral neuropathy due to ischemia 04/12/2017  . Absent pedal pulses 01/09/2017  . Claudication ([place]) 01/09/2017  . Goals of care, counseling/discussion 12/25/2016  . History of malignant neoplasm of oropharynx 12/07/2016  . Neck mass 10/07/2016  . Chest pain 02/07/2015  . Health care maintenance 11/03/2014  . Daytime somnolence 04/15/2014  . DDD (degenerative disc disease), cervical 02/07/2014  . Myofascial pain 02/07/2014  . Basal cell carcinoma of face 10/24/2013  . Neck pain 09/16/2013  . GERD (gastroesophageal reflux disease) 01/23/2013  . Dizziness 01/23/2013  . Essential hypertension, benign 01/17/2013  . Hypercholesterolemia 01/17/2013  . History of migraine headaches 02/16/2011  Prior to Admission medications   Medication Sig Start Date End Date Taking? Authorizing Provider  apixaban (ELIQUIS) 5 MG TABS tablet Take 1 tablet (5 mg total) by mouth every 12 (twelve) hours. 03/16/19   Einar Pheasant, MD  DENTA 5000 PLUS 1.1 % CREA dental cream Place 1 g onto teeth daily. 05/30/19   [provider]  levETIRAcetam (KEPPRA) 500 MG tablet Take 1 tablet (500 mg total) by mouth 2 (two) times daily.  05/24/19   Eulogio Bear U, DO  lisinopril (ZESTRIL) 20 MG tablet TAKE 1 TABLET BY MOUTH EVERY DAY 01/10/19   Einar Pheasant, MD  Multiple Vitamin (MULTIVITAMIN) capsule Take by mouth. 12/04/10   [provider]  nystatin (MYCOSTATIN) 100000 UNIT/ML suspension 26mls - swish and spit tid 05/29/19   Einar Pheasant, MD  omeprazole (PRILOSEC) 20 MG capsule Take 1 capsule (20 mg total) by mouth 2 (two) times daily. 07/18/19   Einar Pheasant, MD  pravastatin (PRAVACHOL) 40 MG tablet Take 1 tablet (40 mg total) by mouth daily. 10/12/19   Einar Pheasant, MD    Allergies Patient has no known allergies.    Social History Social History   Tobacco Use  . Smoking status: Former Smoker    Quit date: 11/11/1996    Years since quitting: 23.1  . Smokeless tobacco: Never Used  Substance Use Topics  . Alcohol use: No    Alcohol/week: 0.0 standard drinks    Comment: occasional  . Drug use: No    Review of Systems Patient denies headaches, rhinorrhea, blurry vision, numbness, shortness of breath, chest pain, edema, cough, abdominal pain, nausea, vomiting, diarrhea, dysuria, fevers, rashes or hallucinations unless otherwise stated above in HPI. ____________________________________________   PHYSICAL EXAM:  VITAL SIGNS: Vitals:   12/23/19 1937  Pulse: (!) 107  Resp: 19  Temp: 97.7 F (36.5 C)  SpO2: 99%    Constitutional: Alert and oriented.  Eyes: Conjunctivae are normal.  Head: Atraumatic. Nose: No congestion/rhinnorhea. Mouth/Throat: Mucous membranes are moist.   Neck: No stridor. Painless ROM.  Cardiovascular: Normal rate, regular rhythm. Grossly normal heart sounds.  Good peripheral circulation. Respiratory: Normal respiratory effort.  No retractions. Lungs CTAB. Gastrointestinal: Soft and nontender. No distention. No abdominal bruits. No CVA tenderness. Genitourinary:  Musculoskeletal: No lower extremity tenderness nor edema.  No joint effusions. Neurologic:  Normal  speech and language. No gross focal neurologic deficits are appreciated. No facial droop Skin:  Skin is warm, dry and intact. No rash noted. Psychiatric: Mood and affect are normal. Speech and behavior are normal.  ____________________________________________   LABS (all labs ordered are listed, but only abnormal results are displayed)  Results for orders placed or performed during the hospital encounter of 12/23/19 (from the past 24 hour(s))  Ethanol     Status: None   Collection Time: 12/23/19  7:40 PM  Result Value Ref Range   Alcohol, Ethyl (B) <10 <10 mg/dL  Comprehensive metabolic panel     Status: Abnormal   Collection Time: 12/23/19  7:40 PM  Result Value Ref Range   Sodium 136 135 - 145 mmol/L   Potassium 4.0 3.5 - 5.1 mmol/L   Chloride 105 98 - 111 mmol/L   CO2 22 22 - 32 mmol/L   Glucose, Bld 141 (H) 70 - 99 mg/dL   BUN 15 6 - 20 mg/dL   Creatinine, Ser 1.09 0.61 - 1.24 mg/dL   Calcium 8.9 8.9 - 10.3 mg/dL   Total Protein 7.6 6.5 - 8.1 g/dL  Albumin 4.1 3.5 - 5.0 g/dL   AST 34 15 - 41 U/L   ALT 26 0 - 44 U/L   Alkaline Phosphatase 59 38 - 126 U/L   Total Bilirubin 0.6 0.3 - 1.2 mg/dL   GFR calc non Af Amer >60 >60 mL/min   GFR calc Af Amer >60 >60 mL/min   Anion gap 9 5 - 15  CBC WITH DIFFERENTIAL     Status: Abnormal   Collection Time: 12/23/19  7:40 PM  Result Value Ref Range   WBC 16.5 (H) 4.0 - 10.5 K/uL   RBC 4.57 4.22 - 5.81 MIL/uL   Hemoglobin 12.8 (L) 13.0 - 17.0 g/dL   HCT 38.5 (L) 39.0 - 52.0 %   MCV 84.2 80.0 - 100.0 fL   MCH 28.0 26.0 - 34.0 pg   MCHC 33.2 30.0 - 36.0 g/dL   RDW 13.5 11.5 - 15.5 %   Platelets 288 150 - 400 K/uL   nRBC 0.0 0.0 - 0.2 %   Neutrophils Relative % 87 %   Neutro Abs 14.2 (H) 1.7 - 7.7 K/uL   Lymphocytes Relative 7 %   Lymphs Abs 1.2 0.7 - 4.0 K/uL   Monocytes Relative 5 %   Monocytes Absolute 0.9 0.1 - 1.0 K/uL   Eosinophils Relative 1 %   Eosinophils Absolute 0.1 0.0 - 0.5 K/uL   Basophils Relative 0 %    Basophils Absolute 0.0 0.0 - 0.1 K/uL   Immature Granulocytes 0 %   Abs Immature Granulocytes 0.07 0.00 - 0.07 K/uL  Troponin I (High Sensitivity)     Status: Abnormal   Collection Time: 12/23/19  7:40 PM  Result Value Ref Range   Troponin I (High Sensitivity) 189 (HH) <18 ng/L   ____________________________________________  EKG My review and personal interpretation at Time:   19:36 Indication: syncope  Rate: 110  Rhythm: sinus Axis: normal Other: normal intervals, no stemi   My review and personal interpretation at Time:  21:09 Indication: syncope  Rate: 105  Rhythm: sinus Axis: normal Other: occasional pvc, no stemi, no delta  ____________________________________________  RADIOLOGY  I personally reviewed all radiographic images ordered to evaluate for the above acute complaints and reviewed radiology reports and findings.  These findings were personally discussed with the patient.  Please see medical record for radiology report.  ____________________________________________   PROCEDURES  Procedure(s) performed:  Procedures    Critical Care performed: no ____________________________________________   INITIAL IMPRESSION / ASSESSMENT AND PLAN / ED COURSE  Pertinent labs & imaging results that were available during my care of the patient were reviewed by me and considered in my medical decision making (see chart for details).   DDX: Dysrhythmia, ACS, dehydration, electrolyte abnormality, sepsis, seizure, ICH, ACS  Timothy Garrison is a 59 y.o. who presents to the ED with symptoms as described above.  Currently patient is well-appearing is reassuring neuro exam.  Abdominal exam soft benign.  EKG without any evidence of acute ischemia or dysrhythmia.  Mildly tachycardic.  Denies any pleuritic discomfort and is on Eliquis.  Have a lower suspicion for PE.  Possible component of dehydration and overexertion as he was mowing lawn for several hours.  Will order blood work and  given his extensive PAD history will order serial enzymes.  Clinical Course as of Dec 22 2137  Sat Dec 23, 2019  2105 Noted significantly elevated troponin.  Patient reassessed denies any chest pain or pressure at this time.  No shortness of breath.  Does have extensive PAD history.  Has been compliant with his Eliquis.  Wife states it was not seizure-like episode and seemed like the patient was clammy appearing.  Does admit to drinking alcohol last night.  Did have some nausea.  May be component of dehydration will give aspirin.  Is not having pain right now we will hold off on heparin until repeat [place] as he is on Eliquis.   [PR]    Clinical Course User Index [PR] Merlyn Lot, MD    The patient was evaluated in Emergency Department today for the symptoms described in the history of present illness. He/she was evaluated in the context of the global COVID-19 pandemic, which necessitated consideration that the patient might be at risk for infection with the SARS-CoV-2 virus that causes COVID-19. Institutional protocols and algorithms that pertain to the evaluation of patients at risk for COVID-19 are in a state of rapid change based on information released by regulatory bodies including the CDC and federal and state organizations. These policies and algorithms were followed during the patient's care in the ED.  As part of my medical decision making, I reviewed the following data within the [place] notes reviewed and incorporated, Labs reviewed, notes from prior ED visits and [place] Controlled Substance Database   ____________________________________________   FINAL CLINICAL IMPRESSION(S) / ED DIAGNOSES  Final diagnoses:  Syncope and collapse      NEW MEDICATIONS STARTED DURING THIS VISIT:  New Prescriptions   No medications on file     Note:  This document was prepared using Dragon voice recognition software and may include unintentional dictation  errors.    Merlyn Lot, MD 12/23/19 2139

## 2019-12-23 NOTE — H&P (Addendum)
[facility] [place] NAME: Timothy Garrison    MR#:  [id number]  DATE OF BIRTH:  [birth year]  DATE OF ADMISSION:  12/23/2019  PRIMARY CARE PHYSICIAN: Einar Pheasant, MD   REQUESTING/REFERRING PHYSICIAN: Merlyn Lot, MD CHIEF COMPLAINT:   Chief Complaint  Patient presents with  . Altered Mental Status    HISTORY OF PRESENT ILLNESS:  Timothy Garrison  is a 59 y.o. Caucasian male with a known history of hypertension, dyslipidemia, GERD and VTE, who presented to the emergency room with acute onset of syncope while mowing his lawn with.  He has a history of seizures but his wife has not seen any seizure activity during this event.  She noted he was clammy and pale.  According to EMS he was slightly confused after regaining his consciousness.  He denied any chest pain or palpitations.  He had nausea without vomiting or abdominal pain.  He denied any dysuria, oliguria or hematuria or flank pain.  Upon presentation to the emergency room, heart rate was 119 otherwise vital signs were within normal.  Respiratory rate was later on 21.  Urine drug screen came back negative and urinalysis was unremarkable.  COVID-19 PCR and influenza antigens came back negative.  High-sensitivity troponin was 189 and repeat level was 598.  CMP was unremarkable and CBC showed leukocytosis of 16.5 with neutrophilia with mild anemia.  Alcohol levels less than 10.  Noncontrasted head CT scan revealed no acute intracranial abnormalities with stable chronic ischemic changes.  Chest x-ray showed no acute cardiopulmonary disease.  The patient was given 4 baby aspirin and 2 L bolus of IV normal saline.  He will be admitted to a progressive cardiac unit bed for further evaluation and management.   PAST MEDICAL HISTORY:   Past Medical History:  Diagnosis Date  . Basal cell carcinoma (BCC) of back   . Basal cell carcinoma (BCC) of nasal sidewall   . Dizziness   . GERD (gastroesophageal reflux  disease)   . H/O blood clots   . Hypercholesterolemia   . Hypertension    hx of HBP readings  . Metastatic squamous cell carcinoma ([place]) 2018   bx cervical lymph node  . Vascular abnormality   . VTE (venous thromboembolism) 2018   associated with chemotherapy    PAST SURGICAL HISTORY:   Past Surgical History:  Procedure Laterality Date  . COLONOSCOPY    . ESOPHAGOGASTRODUODENOSCOPY (EGD) WITH PROPOFOL N/A 05/24/2015   Procedure: ESOPHAGOGASTRODUODENOSCOPY (EGD) WITH PROPOFOL;  Surgeon: Manya Silvas, MD;  Location[facility] ENDOSCOPY;  Service: Endoscopy;  Laterality: N/A;  . IR IMAGING GUIDED PORT INSERTION  12/09/2016  . SAVORY DILATION N/A 05/24/2015   Procedure: SAVORY DILATION;  Surgeon: Manya Silvas, MD;  Location: [facility] ENDOSCOPY;  Service: Endoscopy;  Laterality: N/A;    SOCIAL HISTORY:   Social History   Tobacco Use  . Smoking status: Former Smoker    Quit date: 11/11/1996    Years since quitting: 23.1  . Smokeless tobacco: Never Used  Substance Use Topics  . Alcohol use: No    Alcohol/week: 0.0 standard drinks    Comment: occasional    FAMILY HISTORY:   Family History  Problem Relation Age of Onset  . Hyperlipidemia Mother   . Heart disease Mother   . Hypertension Mother   . Alzheimer's disease Mother   . Prostate cancer Father   . Stroke Father     DRUG ALLERGIES:  No Known Allergies  REVIEW OF  SYSTEMS:   ROS As per history of present illness. All pertinent systems were reviewed above. Constitutional,  HEENT, cardiovascular, respiratory, GI, GU, musculoskeletal, neuro, psychiatric, endocrine,  integumentary and hematologic systems were reviewed and are otherwise  negative/unremarkable except for positive findings mentioned above in the HPI.   MEDICATIONS AT HOME:   Prior to Admission medications   Medication Sig Start Date End Date Taking? Authorizing Provider  apixaban (ELIQUIS) 5 MG TABS tablet Take 1 tablet (5 mg total) by mouth every 12  (twelve) hours. 03/16/19   Einar Pheasant, MD  DENTA 5000 PLUS 1.1 % CREA dental cream Place 1 g onto teeth daily. 05/30/19   [provider]  levETIRAcetam (KEPPRA) 500 MG tablet Take 1 tablet (500 mg total) by mouth 2 (two) times daily. 05/24/19   Eulogio Bear U, DO  lisinopril (ZESTRIL) 20 MG tablet TAKE 1 TABLET BY MOUTH EVERY DAY 01/10/19   Einar Pheasant, MD  Multiple Vitamin (MULTIVITAMIN) capsule Take by mouth. 12/04/10   [provider]  nystatin (MYCOSTATIN) 100000 UNIT/ML suspension 85mls - swish and spit tid 05/29/19   Einar Pheasant, MD  omeprazole (PRILOSEC) 20 MG capsule Take 1 capsule (20 mg total) by mouth 2 (two) times daily. 07/18/19   Einar Pheasant, MD  pravastatin (PRAVACHOL) 40 MG tablet Take 1 tablet (40 mg total) by mouth daily. 10/12/19   Einar Pheasant, MD      VITAL SIGNS:  Pulse (!) 107, temperature 97.7 F (36.5 C), temperature source Oral, resp. rate 19, height 5\' 7"  (1.702 m), weight 70.3 kg, SpO2 99 %.  PHYSICAL EXAMINATION:  Physical Exam  GENERAL:  59 y.o.-year-old Caucasian male patient lying in the bed with no acute distress.  EYES: Pupils equal, round, reactive to light and accommodation. No scleral icterus. Extraocular muscles intact.  HEENT: Head atraumatic, normocephalic. Oropharynx and nasopharynx clear.  NECK:  Supple, no jugular venous distention. No thyroid enlargement, no tenderness.  LUNGS: Normal breath sounds bilaterally, no wheezing, rales,rhonchi or crepitation. No use of accessory muscles of respiration.  CARDIOVASCULAR: Regular rate and rhythm, S1, S2 normal. No murmurs, rubs, or gallops.  ABDOMEN: Soft, nondistended, nontender. Bowel sounds present. No organomegaly or mass.  EXTREMITIES: No pedal edema, cyanosis, or clubbing.  NEUROLOGIC: Cranial nerves II through XII are intact. Muscle strength 5/5 in all extremities. Sensation intact. Gait not checked.  PSYCHIATRIC: The patient is alert and oriented x 3.  Normal  affect and good eye contact. SKIN: No obvious rash, lesion, or ulcer.   LABORATORY PANEL:   CBC Recent Labs  Lab 12/23/19 1940  WBC 16.5*  HGB 12.8*  HCT 38.5*  PLT 288   ------------------------------------------------------------------------------------------------------------------  Chemistries  Recent Labs  Lab 12/23/19 1940  NA 136  K 4.0  CL 105  CO2 22  GLUCOSE 141*  BUN 15  CREATININE 1.09  CALCIUM 8.9  AST 34  ALT 26  ALKPHOS 59  BILITOT 0.6   ------------------------------------------------------------------------------------------------------------------  Cardiac Enzymes No results for input(s): TROPONINI in the last 168 hours. ------------------------------------------------------------------------------------------------------------------  RADIOLOGY:  CT HEAD WO CONTRAST  Result Date: 12/23/2019 CLINICAL DATA:  Seizure, nausea, amnesia EXAM: CT HEAD WITHOUT CONTRAST TECHNIQUE: Contiguous axial images were obtained from the base of the skull through the vertex without intravenous contrast. COMPARISON:  05/23/2019 FINDINGS: Brain: Stable encephalomalacia right temporal lobe compatible with prior infarct. No acute infarct or hemorrhage. Lateral ventricles and midline structures are unremarkable. No acute extra-axial fluid collections. No mass effect. Vascular: No hyperdense vessel or unexpected calcification. Skull:  Normal. Negative for fracture or focal lesion. Sinuses/Orbits: Mucosal thickening within the left posterior ethmoid air cells. Remaining sinuses are clear. Other: None. IMPRESSION: 1. Stable chronic ischemic change.  No acute intracranial process. Electronically Signed   By: Randa Ngo M.D.   On: 12/23/2019 20:39   DG Chest Portable 1 View  Result Date: 12/23/2019 CLINICAL DATA:  Seizure, weakness EXAM: PORTABLE CHEST 1 VIEW COMPARISON:  05/23/2019 FINDINGS: The heart size and mediastinal contours are within normal limits. Both lungs are clear.  The visualized skeletal structures are unremarkable. IMPRESSION: No active disease. Electronically Signed   By: Randa Ngo M.D.   On: 12/23/2019 20:39   CT ANGIO CHEST AORTA W/CM & OR WO/CM  Result Date: 12/23/2019 CLINICAL DATA:  59 year old male with remote history of or pharyngeal cancer. Aortic Disease. No trauma. EXAM: CT ANGIOGRAPHY CHEST WITH CONTRAST TECHNIQUE: Multidetector CT imaging of the chest was performed using the standard protocol during bolus administration of intravenous contrast. Multiplanar CT image reconstructions and MIPs were obtained to evaluate the vascular anatomy. CONTRAST:  152mL OMNIPAQUE IOHEXOL 350 MG/ML SOLN COMPARISON:  Chest radiograph dated 12/23/2019. FINDINGS: Cardiovascular: There is no cardiomegaly or pericardial effusion. Moderate atherosclerotic calcification of the aorta. No aneurysmal dilatation or dissection. The origins of the great vessels of the aortic arch appear patent as visualized. No pulmonary artery embolus identified. Mediastinum/Nodes: There is no hilar or mediastinal adenopathy. The esophagus and the thyroid gland are grossly unremarkable. No mediastinal fluid collection. Lungs/Pleura: Minimal bibasilar dependent atelectasis. No focal consolidation, pleural effusion, pneumothorax. The central airways are patent. Upper Abdomen: No acute abnormality. Musculoskeletal: No chest wall abnormality. No acute or significant osseous findings. Review of the MIP images confirms the above findings. IMPRESSION: No acute intrathoracic pathology. No CT evidence of pulmonary embolism. Electronically Signed   By: Anner Crete M.D.   On: 12/23/2019 22:33      IMPRESSION AND PLAN:   1.  Non-STEMI with subsequent syncope. -The patient will be admitted to a cardiac progressive unit bed. -We will follow serial troponin I's. -Should be placed on aspirin as well as as needed sublingual nitroglycerin and morphine sulfate for pain. -We will place her on high-dose  statin and IV heparin drip. -We will add beta-blocker therapy with Lopressor. -We will obtain a cardiology consultation.  Dr. Humphrey Rolls was notified about the patient. -We will follow orthostatics and check the patient for arrhythmias.  2.  Seizure disorder. -We will continue Keppra.  3.  Hypertension. -We will continue lisinopril.  4.  Dyslipidemia. -We will continue statin therapy.  Fasting lipids will be checked.  5.  DVT prophylaxis. -The patient will be on IV heparin as mentioned above.  Eliquis is held off.   All the records are reviewed and case discussed with ED provider. The plan of care was discussed in details with the patient (and family). I answered all questions. The patient agreed to proceed with the above mentioned plan. Further management will depend upon hospital course.   CODE STATUS: Full code  Status is: Inpatient  Remains inpatient appropriate because:Ongoing diagnostic testing needed not appropriate for outpatient work up, Unsafe d/c plan, IV treatments appropriate due to intensity of illness or inability to take PO and Inpatient level of care appropriate due to severity of illness   Dispo: The patient is from: Home              Anticipated d/c is to: Home  Anticipated d/c date is: 2 days              Patient currently is not medically stable to d/c.   TOTAL TIME TAKING CARE OF THIS PATIENT: 55 minutes.    Christel Mormon M.D on 12/23/2019 at 10:41 PM  [facility]   From 7 PM-7 AM, contact night-coverage [email or web address]  CC: Primary care physician; Einar Pheasant, MD   Note: This dictation was prepared with Dragon dictation along with smaller phrase technology. Any transcriptional errors that result from this process are unintentional.

## 2019-12-24 ENCOUNTER — Encounter: Payer: Self-pay | Admitting: Family Medicine

## 2019-12-24 ENCOUNTER — Other Ambulatory Visit: Payer: Self-pay

## 2019-12-24 DIAGNOSIS — I214 Non-ST elevation (NSTEMI) myocardial infarction: Principal | ICD-10-CM

## 2019-12-24 DIAGNOSIS — R55 Syncope and collapse: Secondary | ICD-10-CM | POA: Insufficient documentation

## 2019-12-24 LAB — CBC
HCT: 33.1 % — ABNORMAL LOW (ref 39.0–52.0)
Hemoglobin: 11.1 g/dL — ABNORMAL LOW (ref 13.0–17.0)
MCH: 28.2 pg (ref 26.0–34.0)
MCHC: 33.5 g/dL (ref 30.0–36.0)
MCV: 84 fL (ref 80.0–100.0)
Platelets: 253 10*3/uL (ref 150–400)
RBC: 3.94 MIL/uL — ABNORMAL LOW (ref 4.22–5.81)
RDW: 13.7 % (ref 11.5–15.5)
WBC: 9.8 10*3/uL (ref 4.0–10.5)
nRBC: 0 % (ref 0.0–0.2)

## 2019-12-24 LAB — BASIC METABOLIC PANEL
Anion gap: 7 (ref 5–15)
BUN: 12 mg/dL (ref 6–20)
CO2: 24 mmol/L (ref 22–32)
Calcium: 8.5 mg/dL — ABNORMAL LOW (ref 8.9–10.3)
Chloride: 108 mmol/L (ref 98–111)
Creatinine, Ser: 0.94 mg/dL (ref 0.61–1.24)
GFR calc Af Amer: >60 mL/min (ref >60)
GFR calc non Af Amer: >60 mL/min (ref >60)
Glucose, Bld: 96 mg/dL (ref 70–99)
Potassium: 3.8 mmol/L (ref 3.5–5.1)
Sodium: 139 mmol/L (ref 135–145)

## 2019-12-24 LAB — TROPONIN I (HIGH SENSITIVITY)
Troponin I (High Sensitivity): 718 ng/L (ref <18)
Troponin I (High Sensitivity): 371 ng/L (ref <18)
Troponin I (High Sensitivity): 344 ng/L (ref <18)

## 2019-12-24 LAB — APTT
aPTT: 80 seconds — ABNORMAL HIGH (ref 24–36)
aPTT: 67 seconds — ABNORMAL HIGH (ref 24–36)

## 2019-12-24 LAB — HEPARIN LEVEL (UNFRACTIONATED): Heparin Unfractionated: 1.53 IU/mL — ABNORMAL HIGH (ref 0.30–0.70)

## 2019-12-24 LAB — LIPID PANEL
Cholesterol: 208 mg/dL — ABNORMAL HIGH (ref 0–200)
HDL: 37 mg/dL — ABNORMAL LOW (ref >40)
LDL Cholesterol: 142 mg/dL — ABNORMAL HIGH (ref 0–99)
Total CHOL/HDL Ratio: 5.6 RATIO
Triglycerides: 143 mg/dL (ref <150)
VLDL: 29 mg/dL (ref 0–40)

## 2019-12-24 MED ORDER — SODIUM CHLORIDE 0.9% FLUSH
3.0000 mL | Freq: Two times a day (BID) | INTRAVENOUS | Status: DC
  Administered 2019-12-24: 3 mL via INTRAVENOUS

## 2019-12-24 MED ORDER — LEVETIRACETAM 250 MG PO TABS
250.0000 mg | ORAL_TABLET | Freq: Two times a day (BID) | ORAL | Status: DC
  Administered 2019-12-24 – 2019-12-25 (×3): 250 mg via ORAL
  Filled 2019-12-24 (×3): qty 1

## 2019-12-24 MED ORDER — CHLORHEXIDINE GLUCONATE CLOTH 2 % EX PADS: 6.0000 | MEDICATED_PAD | Freq: Every day | CUTANEOUS | Status: DC

## 2019-12-24 MED ORDER — LEVETIRACETAM 250 MG PO TABS
250.0000 mg | ORAL_TABLET | Freq: Two times a day (BID) | ORAL | Status: DC
  Filled 2019-12-24: qty 1

## 2019-12-24 NOTE — ED Notes (Signed)
Report given to Annie, RN

## 2019-12-24 NOTE — Progress Notes (Signed)
ANTICOAGULATION CONSULT NOTE - Initial Consult  Pharmacy Consult for heparin Indication: chest pain/ACS  No Known Allergies  Patient Measurements: Height: 5\' 7"  (170.2 cm) Weight: 70.3 kg (155 lb) IBW/kg (Calculated) : 66.1 Heparin Dosing Weight: 70.3 kg  Vital Signs: BP: 113/69 (05/09 0930) Pulse Rate: 74 (05/09 0930)  Labs: Recent Labs    12/23/19 1940 12/23/19 2147 12/23/19 2330 12/24/19 0620 12/24/19 1206  HGB 12.8*  --   --  11.1*  --   HCT 38.5*  --   --  33.1*  --   PLT 288  --   --  253  --   APTT  --   --  29 80* 67*  LABPROT  --   --  13.5  --   --   INR  --   --  1.1  --   --   HEPARINUNFRC  --   --  1.53*  --   --   CREATININE 1.09  --   --  0.94  --   TROPONINIHS 189* 598*  --  718*  --     Estimated Creatinine Clearance: 79.1 mL/min (by C-G formula based on SCr of 0.94 mg/dL).   Medical History: Past Medical History:  Diagnosis Date  . Basal cell carcinoma (BCC) of back   . Basal cell carcinoma (BCC) of nasal sidewall   . Dizziness   . GERD (gastroesophageal reflux disease)   . H/O blood clots   . Hypercholesterolemia   . Hypertension    hx of HBP readings  . Metastatic squamous cell carcinoma ([place]) 2018   bx cervical lymph node  . Vascular abnormality   . VTE (venous thromboembolism) 2018   associated with chemotherapy    Medications:  Scheduled:  . aspirin  324 mg Oral NOW   Or  . aspirin  300 mg Rectal NOW  . aspirin EC  81 mg Oral Daily  . atorvastatin  80 mg Oral Daily  . levETIRAcetam  250 mg Oral BID  . lisinopril  20 mg Oral Daily  . metoprolol tartrate  25 mg Oral BID  . multivitamin with minerals  1 tablet Oral Daily  . pantoprazole  40 mg Oral Daily    Assessment: Patient arrives for AMS was found unresponsive and cold w/ h/o PAD, DVT on eliquis PTA, oropharyngeal cancer s/p chemo, and seizures on keppra (not in [place]) presenting to the ED w/ trops of 189 >> 598 w/ EKG showing NSR w/ PVCs, CXR, CT head/angio WNL, baseline  CBC WNL, aPTT/INR, anti-Xa pending. Patient's last dose of eliquis 5 mg on 05/08 @ 1000. Patient is being started on heparin drip for management of NSTEMI.  Heparin infusion running @ 1000 units/hr 05/09 @ 0630 aPTT 80 seconds therapeutic.  05/09 @ 1206 aPTT 67 seconds  Goal of Therapy:  APTT 66 - 102 seconds Heparin level 0.3-0.7 units/ml Monitor platelets by anticoagulation protocol: Yes   Plan:  05/09 @ 1206 aPTT 67 seconds. Level  Therapeutic x2. Since level is just barely therapeutic- will increase infusion to 1050 units/hr.  Will recheck aPTT and HL with AM labs./   CBC trended down a bit, but stable will continue to monitor. CBC ordered daily with AM labs while on heparin infusion per protocol.   Pernell Dupre, PharmD, BCPS Clinical Pharmacist 12/24/2019 12:34 PM

## 2019-12-24 NOTE — Progress Notes (Signed)
PROGRESS NOTE    Timothy Garrison  [id number] DOB: 1961/03/05 DOA: 12/23/2019 PCP: Einar Pheasant, MD   Brief Narrative:  Timothy Garrison  is a 59 y.o. Caucasian male with a known history of hypertension, dyslipidemia, GERD and VTE, who presented to the emergency room with acute onset of syncope while mowing his lawn with.  He has a history of seizures but his wife has not seen any seizure activity during this event.  She noted he was clammy and pale.  EKG without any ST changes, initial troponin I 8 9 with upward trending.  Patient was started on heparin GTT and cardiology was consulted.  Subjective: Patient continued to feel mild chest pressure.  Denies any nausea, vomiting or shortness of breath.  Assessment & Plan:   Active Problems:   NSTEMI (non-ST elevated myocardial infarction) ([place])  NSTEMI with subsequent syncope.  His symptoms and upward trending troponin is concerning for NSTEMI.  Cardiology was consulted-appreciate their recommendations.  He was given aspirin and was placed on heparin gtt. Patient has an established cardiologist with [facility] clinic. Informed Dr. Nehemiah Massed. -Continue heparin gtt. -Monitor troponin. -Continue with high-dose statin. -Continue with Lopressor. -Sublingual nitroglycerin and morphine as needed for pain.  Seizure disorder.  No acute concern -We will continue Keppra.    Hypertension. -We will continue lisinopril.  Objective: Vitals:   12/24/19 1145 12/24/19 1200 12/24/19 1215 12/24/19 1230  BP:  114/72  120/68  Pulse: 65 74 65 68  Resp:    17  Temp:      TempSrc:      SpO2: 96% 95% 96% 97%  Weight:      Height:        Intake/Output Summary (Last 24 hours) at 12/24/2019 1318 Last data filed at 12/24/2019 0723 Gross per 24 hour  Intake 2027.36 ml  Output --  Net 2027.36 ml   Filed Weights   12/23/19 1937  Weight: 70.3 kg    Examination:  General exam: Appears calm and comfortable  Respiratory system: Clear to  auscultation. Respiratory effort normal. Cardiovascular system: S1 & S2 heard, RRR. No JVD, murmurs, rubs, gallops or clicks. Gastrointestinal system: Soft, nontender, nondistended, bowel sounds positive. Central nervous system: Alert and oriented. No focal neurological deficits.Symmetric 5 x 5 power. Extremities: No edema, no cyanosis, pulses intact and symmetrical. Skin: No rashes, lesions or ulcers Psychiatry: Judgement and insight appear normal. Mood & affect appropriate.    DVT prophylaxis: Heparin Code Status: Full Family Communication: Discussed with patient Disposition Plan:  Status is: Inpatient  Remains inpatient appropriate because:Inpatient level of care appropriate due to severity of illness   Dispo: The patient is from: Home              Anticipated d/c is to: Home              Anticipated d/c date is: 1 day              Patient currently is not medically stable to d/c.  Patient with NSTEMI and will need cardiac catheterization before going home.   Consultants:   Cardiology  Procedures:  Antimicrobials:   Data Reviewed: I have personally reviewed following labs and imaging studies  CBC: Recent Labs  Lab 12/23/19 1940 12/24/19 0620  WBC 16.5* 9.8  NEUTROABS 14.2*  --   HGB 12.8* 11.1*  HCT 38.5* 33.1*  MCV 84.2 84.0  PLT 288 [id number]   Basic Metabolic Panel: Recent Labs  Lab 12/23/19 1940 12/24/19 0620  NA  136 139  K 4.0 3.8  CL 105 108  CO2 22 24  GLUCOSE 141* 96  BUN 15 12  CREATININE 1.09 0.94  CALCIUM 8.9 8.5*   GFR: Estimated Creatinine Clearance: 79.1 mL/min (by C-G formula based on SCr of 0.94 mg/dL). Liver Function Tests: Recent Labs  Lab 12/23/19 1940  AST 34  ALT 26  ALKPHOS 59  BILITOT 0.6  PROT 7.6  ALBUMIN 4.1   No results for input(s): LIPASE, AMYLASE in the last 168 hours. No results for input(s): AMMONIA in the last 168 hours. Coagulation Profile: Recent Labs  Lab 12/23/19 2330  INR 1.1   Cardiac Enzymes: No  results for input(s): CKTOTAL, CKMB, CKMBINDEX, TROPONINI in the last 168 hours. BNP (last 3 results) No results for input(s): PROBNP in the last 8760 hours. HbA1C: No results for input(s): HGBA1C in the last 72 hours. CBG: No results for input(s): GLUCAP in the last 168 hours. Lipid Profile: Recent Labs    12/24/19 0620  CHOL 208*  HDL 37*  LDLCALC 142*  TRIG 143  CHOLHDL 5.6   Thyroid Function Tests: No results for input(s): TSH, T4TOTAL, FREET4, T3FREE, THYROIDAB in the last 72 hours. Anemia Panel: No results for input(s): VITAMINB12, FOLATE, FERRITIN, TIBC, IRON, RETICCTPCT in the last 72 hours. Sepsis Labs: No results for input(s): PROCALCITON, LATICACIDVEN in the last 168 hours.  Recent Results (from the past 240 hour(s))  Respiratory Panel by RT PCR (Flu A&B, Covid) - Nasopharyngeal Swab     Status: None   Collection Time: 12/23/19  9:23 PM   Specimen: Nasopharyngeal Swab  Result Value Ref Range Status   SARS Coronavirus 2 by RT PCR NEGATIVE NEGATIVE Final    Comment: (NOTE) SARS-CoV-2 target nucleic acids are NOT DETECTED. The SARS-CoV-2 RNA is generally detectable in upper respiratoy specimens during the acute phase of infection. The lowest concentration of SARS-CoV-2 viral copies this assay can detect is 131 copies/mL. A negative result does not preclude SARS-Cov-2 infection and should not be used as the sole basis for treatment or other patient management decisions. A negative result may occur with  improper specimen collection/handling, submission of specimen other than nasopharyngeal swab, presence of viral mutation(s) within the areas targeted by this assay, and inadequate number of viral copies (<131 copies/mL). A negative result must be combined with clinical observations, patient history, and epidemiological information. The expected result is Negative. Fact Sheet for Patients:  PinkCheek.be Fact Sheet for Healthcare  Providers:  GravelBags.it This test is not yet ap proved or cleared by the [place] FDA and  has been authorized for detection and/or diagnosis of SARS-CoV-2 by FDA under an Emergency Use Authorization (EUA). This EUA will remain  in effect (meaning this test can be used) for the duration of the COVID-19 declaration under Section 564(b)(1) of the Act, 21 U.S.C. section 360bbb-3(b)(1), unless the authorization is terminated or revoked sooner.    Influenza A by PCR NEGATIVE NEGATIVE Final   Influenza B by PCR NEGATIVE NEGATIVE Final    Comment: (NOTE) The Xpert Xpress SARS-CoV-2/FLU/RSV assay is intended as an aid in  the diagnosis of influenza from Nasopharyngeal swab specimens and  should not be used as a sole basis for treatment. Nasal washings and  aspirates are unacceptable for Xpert Xpress SARS-CoV-2/FLU/RSV  testing. Fact Sheet for Patients: PinkCheek.be Fact Sheet for Healthcare Providers: GravelBags.it This test is not yet approved or cleared by the [place] FDA and  has been authorized for detection and/or diagnosis of  SARS-CoV-2 by  FDA under an Emergency Use Authorization (EUA). This EUA will remain  in effect (meaning this test can be used) for the duration of the  Covid-19 declaration under Section 564(b)(1) of the Act, 21  U.S.C. section 360bbb-3(b)(1), unless the authorization is  terminated or revoked. Performed at [facility], [address] [place], [place] [address]      Radiology Studies: CT HEAD WO CONTRAST  Result Date: 12/23/2019 CLINICAL DATA:  Seizure, nausea, amnesia EXAM: CT HEAD WITHOUT CONTRAST TECHNIQUE: Contiguous axial images were obtained from the base of the skull through the vertex without intravenous contrast. COMPARISON:  05/23/2019 FINDINGS: Brain: Stable encephalomalacia right temporal lobe compatible with prior infarct. No acute infarct  or hemorrhage. Lateral ventricles and midline structures are unremarkable. No acute extra-axial fluid collections. No mass effect. Vascular: No hyperdense vessel or unexpected calcification. Skull: Normal. Negative for fracture or focal lesion. Sinuses/Orbits: Mucosal thickening within the left posterior ethmoid air cells. Remaining sinuses are clear. Other: None. IMPRESSION: 1. Stable chronic ischemic change.  No acute intracranial process. Electronically Signed   By: Randa Ngo M.D.   On: 12/23/2019 20:39   DG Chest Portable 1 View  Result Date: 12/23/2019 CLINICAL DATA:  Seizure, weakness EXAM: PORTABLE CHEST 1 VIEW COMPARISON:  05/23/2019 FINDINGS: The heart size and mediastinal contours are within normal limits. Both lungs are clear. The visualized skeletal structures are unremarkable. IMPRESSION: No active disease. Electronically Signed   By: Randa Ngo M.D.   On: 12/23/2019 20:39   CT ANGIO CHEST AORTA W/CM & OR WO/CM  Result Date: 12/23/2019 CLINICAL DATA:  59 year old male with remote history of or pharyngeal cancer. Aortic Disease. No trauma. EXAM: CT ANGIOGRAPHY CHEST WITH CONTRAST TECHNIQUE: Multidetector CT imaging of the chest was performed using the standard protocol during bolus administration of intravenous contrast. Multiplanar CT image reconstructions and MIPs were obtained to evaluate the vascular anatomy. CONTRAST:  193mL OMNIPAQUE IOHEXOL 350 MG/ML SOLN COMPARISON:  Chest radiograph dated 12/23/2019. FINDINGS: Cardiovascular: There is no cardiomegaly or pericardial effusion. Moderate atherosclerotic calcification of the aorta. No aneurysmal dilatation or dissection. The origins of the great vessels of the aortic arch appear patent as visualized. No pulmonary artery embolus identified. Mediastinum/Nodes: There is no hilar or mediastinal adenopathy. The esophagus and the thyroid gland are grossly unremarkable. No mediastinal fluid collection. Lungs/Pleura: Minimal bibasilar  dependent atelectasis. No focal consolidation, pleural effusion, pneumothorax. The central airways are patent. Upper Abdomen: No acute abnormality. Musculoskeletal: No chest wall abnormality. No acute or significant osseous findings. Review of the MIP images confirms the above findings. IMPRESSION: No acute intrathoracic pathology. No CT evidence of pulmonary embolism. Electronically Signed   By: Anner Crete M.D.   On: 12/23/2019 22:33    Scheduled Meds: . aspirin  324 mg Oral NOW   Or  . aspirin  300 mg Rectal NOW  . aspirin EC  81 mg Oral Daily  . atorvastatin  80 mg Oral Daily  . levETIRAcetam  250 mg Oral BID  . lisinopril  20 mg Oral Daily  . metoprolol tartrate  25 mg Oral BID  . multivitamin with minerals  1 tablet Oral Daily  . pantoprazole  40 mg Oral Daily   Continuous Infusions: . sodium chloride 100 mL/hr at 12/24/19 0204  . heparin 1,050 Units/hr (12/24/19 1252)     LOS: 1 day   Time spent: 40 minutes  Lorella Nimrod, MD [facility]  If 7PM-7AM, please contact night-coverage [email or web address]  12/24/2019, 1:18  PM   This record has been created using Systems analyst. Errors have been sought and corrected,but may not always be located. Such creation errors do not reflect on the standard of care.

## 2019-12-24 NOTE — Progress Notes (Signed)
ANTICOAGULATION CONSULT NOTE - Initial Consult  Pharmacy Consult for heparin Indication: chest pain/ACS  No Known Allergies  Patient Measurements: Height: 5\' 7"  (170.2 cm) Weight: 70.3 kg (155 lb) IBW/kg (Calculated) : 66.1 Heparin Dosing Weight: 70.3 kg  Vital Signs: Temp: 97.7 F (36.5 C) (05/08 1937) Temp Source: Oral (05/08 1937) BP: 106/63 (05/09 0600) Pulse Rate: 81 (05/09 0600)  Labs: Recent Labs    12/23/19 1940 12/23/19 2147 12/23/19 2330 12/24/19 0620  HGB 12.8*  --   --  11.1*  HCT 38.5*  --   --  33.1*  PLT 288  --   --  253  APTT  --   --  29 80*  LABPROT  --   --  13.5  --   INR  --   --  1.1  --   HEPARINUNFRC  --   --  1.53*  --   CREATININE 1.09  --   --   --   TROPONINIHS 189* 598*  --   --     Estimated Creatinine Clearance: 68.2 mL/min (by C-G formula based on SCr of 1.09 mg/dL).   Medical History: Past Medical History:  Diagnosis Date  . Basal cell carcinoma (BCC) of back   . Basal cell carcinoma (BCC) of nasal sidewall   . Dizziness   . GERD (gastroesophageal reflux disease)   . H/O blood clots   . Hypercholesterolemia   . Hypertension    hx of HBP readings  . Metastatic squamous cell carcinoma ([place]) 2018   bx cervical lymph node  . Vascular abnormality   . VTE (venous thromboembolism) 2018   associated with chemotherapy    Medications:  Scheduled:  . aspirin  324 mg Oral NOW   Or  . aspirin  300 mg Rectal NOW  . aspirin EC  81 mg Oral Daily  . atorvastatin  80 mg Oral Daily  . levETIRAcetam  250 mg Oral BID  . lisinopril  20 mg Oral Daily  . metoprolol tartrate  25 mg Oral BID  . multivitamin with minerals  1 tablet Oral Daily  . pantoprazole  40 mg Oral Daily    Assessment: Patient arrives for AMS was found unresponsive and cold w/ h/o PAD, DVT on eliquis PTA, oropharyngeal cancer s/p chemo, and seizures on keppra (not in [place]) presenting to the ED w/ trops of 189 >> 598 w/ EKG showing NSR w/ PVCs, CXR, CT head/angio  WNL, baseline CBC WNL, aPTT/INR, anti-Xa pending. Patient's last dose of eliquis 5 mg on 05/08 @ 1000. Patient is being started on heparin drip for management of NSTEMI.  Goal of Therapy:  APTT 66 - 102 seconds Heparin level 0.3-0.7 units/ml Monitor platelets by anticoagulation protocol: Yes   Plan:  05/09 @ 0630 aPTT 80 seconds therapeutic. Will continue current rate and will recheck aPTT at 1200 and continue to monitor. CBC trended down a bit, but stable will continue to monitor.  Tobie Lords, PharmD, BCPS Clinical Pharmacist 12/24/2019,7:07 AM

## 2019-12-24 NOTE — Consult Note (Signed)
[facility] Consultation Note  [name], MRN: [id number], DOB/AGE: Jan 02, 1961 59 y.o. Admit date: 12/23/2019   Date of Consult: 12/24/2019 Primary Physician: Einar Pheasant, MD Primary Cardiologist: Nehemiah Massed  Chief Complaint:  Chief Complaint  Patient presents with  . Altered Mental Status   Reason for Consult: Chest pain  HPI: 59 y.o. male with known hypertension hyperlipidemia and metastatic squamous cell carcinoma status post chemotherapy with some peripheral vascular disease multifactorial in nature with bifemoral bypass who has had appropriate medication management for further risk reduction including hypertension control with high intensity cholesterol therapy.  He is also on anticoagulation for further risk reduction of thrombosis in periphery due to his previous chemotherapy.  He had an episode of weakness fatigue diaphoresis nausea vomiting and dizziness for which he was taken to the hospital.  At that time he had an EKG showing sinus tachycardia with left atrial enlargement.  The patient then has had full resolution of his symptoms and feels quite good at this time with no further issues today.  He does have an elevated troponin of 718 consistent with a non-ST elevation myocardial infarction.  Currently on no medications he is hemodynamically stable  Past Medical History:  Diagnosis Date  . Basal cell carcinoma (BCC) of back   . Basal cell carcinoma (BCC) of nasal sidewall   . Dizziness   . GERD (gastroesophageal reflux disease)   . H/O blood clots   . Hypercholesterolemia   . Hypertension    hx of HBP readings  . Metastatic squamous cell carcinoma ([place]) 2018   bx cervical lymph node  . Vascular abnormality   . VTE (venous thromboembolism) 2018   associated with chemotherapy      Surgical History:  Past Surgical History:  Procedure Laterality Date  . COLONOSCOPY    . ESOPHAGOGASTRODUODENOSCOPY (EGD) WITH PROPOFOL N/A 05/24/2015    Procedure: ESOPHAGOGASTRODUODENOSCOPY (EGD) WITH PROPOFOL;  Surgeon: Manya Silvas, MD;  Location: [facility] ENDOSCOPY;  Service: Endoscopy;  Laterality: N/A;  . IR IMAGING GUIDED PORT INSERTION  12/09/2016  . SAVORY DILATION N/A 05/24/2015   Procedure: SAVORY DILATION;  Surgeon: Manya Silvas, MD;  Location: [facility] ENDOSCOPY;  Service: Endoscopy;  Laterality: N/A;     Home Meds: Prior to Admission medications   Medication Sig Start Date End Date Taking? Authorizing Provider  apixaban (ELIQUIS) 5 MG TABS tablet Take 1 tablet (5 mg total) by mouth every 12 (twelve) hours. 03/16/19  Yes Einar Pheasant, MD  cholecalciferol (VITAMIN D3) 25 MCG (1000 UNIT) tablet Take 1,000 Units by mouth daily.   Yes [provider]  DENTA 5000 PLUS 1.1 % CREA dental cream Place 1 g onto teeth daily. 05/30/19  Yes [provider]  levETIRAcetam (KEPPRA) 500 MG tablet Take 1 tablet (500 mg total) by mouth 2 (two) times daily. Patient taking differently: Take 250 mg by mouth 2 (two) times daily.  05/24/19  Yes Vann, Jessica U, DO  lisinopril (ZESTRIL) 20 MG tablet TAKE 1 TABLET BY MOUTH EVERY DAY 01/10/19  Yes Einar Pheasant, MD  Multiple Vitamin (MULTIVITAMIN) capsule Take by mouth. 12/04/10  Yes [provider]  omeprazole (PRILOSEC) 20 MG capsule Take 1 capsule (20 mg total) by mouth 2 (two) times daily. Patient taking differently: Take 20 mg by mouth daily.  07/18/19  Yes Einar Pheasant, MD  pravastatin (PRAVACHOL) 40 MG tablet Take 1 tablet (40 mg total) by mouth daily. 10/12/19  Yes Einar Pheasant, MD  nystatin (MYCOSTATIN) 100000 UNIT/ML  suspension 52mls - swish and spit tid Patient not taking: Reported on 12/23/2019 05/29/19   Einar Pheasant, MD    Inpatient Medications:  . aspirin  324 mg Oral NOW   Or  . aspirin  300 mg Rectal NOW  . aspirin EC  81 mg Oral Daily  . atorvastatin  80 mg Oral Daily  . levETIRAcetam  250 mg Oral BID  . lisinopril  20 mg Oral Daily  . metoprolol  tartrate  25 mg Oral BID  . multivitamin with minerals  1 tablet Oral Daily  . pantoprazole  40 mg Oral Daily   . sodium chloride 100 mL/hr at 12/24/19 1404  . heparin 1,050 Units/hr (12/24/19 1252)    Allergies: No Known Allergies  Social History   Socioeconomic History  . Marital status: Married    Spouse name: Not on file  . Number of children: Not on file  . Years of education: Not on file  . Highest education level: Not on file  Occupational History  . Occupation: Dealer  Tobacco Use  . Smoking status: Former Smoker    Quit date: 11/11/1996    Years since quitting: 23.1  . Smokeless tobacco: Never Used  Substance and Sexual Activity  . Alcohol use: No    Alcohol/week: 0.0 standard drinks    Comment: occasional  . Drug use: No  . Sexual activity: Yes  Other Topics Concern  . Not on file  Social History Narrative  . Not on file   Social Determinants of Health   Financial Resource Strain:   . Difficulty of Paying Living Expenses:   Food Insecurity:   . Worried About Charity fundraiser in the Last Year:   . Arboriculturist in the Last Year:   Transportation Needs:   . Film/video editor (Medical):   Marland Kitchen Lack of Transportation (Non-Medical):   Physical Activity:   . Days of Exercise per Week:   . Minutes of Exercise per Session:   Stress:   . Feeling of Stress :   Social Connections:   . Frequency of Communication with Friends and Family:   . Frequency of Social Gatherings with Friends and Family:   . Attends Religious Services:   . Active Member of Clubs or Organizations:   . Attends Archivist Meetings:   Marland Kitchen Marital Status:   Intimate Partner Violence:   . Fear of Current or Ex-Partner:   . Emotionally Abused:   Marland Kitchen Physically Abused:   . Sexually Abused:      Family History  Problem Relation Age of Onset  . Hyperlipidemia Mother   . Heart disease Mother   . Hypertension Mother   . Alzheimer's disease Mother   . Prostate cancer  Father   . Stroke Father      Review of Systems Positive for none Negative for: General:  chills, fever, night sweats or weight changes.  Cardiovascular: PND orthopnea syncope dizziness  Dermatological skin lesions rashes Respiratory: Cough congestion Urologic: Frequent urination urination at night and hematuria Abdominal: negative for nausea, vomiting, diarrhea, bright red blood per rectum, melena, or hematemesis Neurologic: negative for visual changes, and/or hearing changes  All other systems reviewed and are otherwise negative except as noted above.  Labs: No results for input(s): CKTOTAL, CKMB, TROPONINI in the last 72 hours. Lab Results  Component Value Date   WBC 9.8 12/24/2019   HGB 11.1 (L) 12/24/2019   HCT 33.1 (L) 12/24/2019   MCV 84.0 12/24/2019  PLT 253 12/24/2019    Recent Labs  Lab 12/23/19 1940 12/23/19 1940 12/24/19 0620  NA 136   < > 139  K 4.0   < > 3.8  CL 105   < > 108  CO2 22   < > 24  BUN 15   < > 12  CREATININE 1.09   < > 0.94  CALCIUM 8.9   < > 8.5*  PROT 7.6  --   --   BILITOT 0.6  --   --   ALKPHOS 59  --   --   ALT 26  --   --   AST 34  --   --   GLUCOSE 141*   < > 96   < > = values in this interval not displayed.   Lab Results  Component Value Date   CHOL 208 (H) 12/24/2019   HDL 37 (L) 12/24/2019   LDLCALC 142 (H) 12/24/2019   TRIG 143 12/24/2019   No results found for: DDIMER  Radiology/Studies:  CT HEAD WO CONTRAST  Result Date: 12/23/2019 CLINICAL DATA:  Seizure, nausea, amnesia EXAM: CT HEAD WITHOUT CONTRAST TECHNIQUE: Contiguous axial images were obtained from the base of the skull through the vertex without intravenous contrast. COMPARISON:  05/23/2019 FINDINGS: Brain: Stable encephalomalacia right temporal lobe compatible with prior infarct. No acute infarct or hemorrhage. Lateral ventricles and midline structures are unremarkable. No acute extra-axial fluid collections. No mass effect. Vascular: No hyperdense vessel or  unexpected calcification. Skull: Normal. Negative for fracture or focal lesion. Sinuses/Orbits: Mucosal thickening within the left posterior ethmoid air cells. Remaining sinuses are clear. Other: None. IMPRESSION: 1. Stable chronic ischemic change.  No acute intracranial process. Electronically Signed   By: Randa Ngo M.D.   On: 12/23/2019 20:39   DG Chest Portable 1 View  Result Date: 12/23/2019 CLINICAL DATA:  Seizure, weakness EXAM: PORTABLE CHEST 1 VIEW COMPARISON:  05/23/2019 FINDINGS: The heart size and mediastinal contours are within normal limits. Both lungs are clear. The visualized skeletal structures are unremarkable. IMPRESSION: No active disease. Electronically Signed   By: Randa Ngo M.D.   On: 12/23/2019 20:39   CT ANGIO CHEST AORTA W/CM & OR WO/CM  Result Date: 12/23/2019 CLINICAL DATA:  59 year old male with remote history of or pharyngeal cancer. Aortic Disease. No trauma. EXAM: CT ANGIOGRAPHY CHEST WITH CONTRAST TECHNIQUE: Multidetector CT imaging of the chest was performed using the standard protocol during bolus administration of intravenous contrast. Multiplanar CT image reconstructions and MIPs were obtained to evaluate the vascular anatomy. CONTRAST:  165mL OMNIPAQUE IOHEXOL 350 MG/ML SOLN COMPARISON:  Chest radiograph dated 12/23/2019. FINDINGS: Cardiovascular: There is no cardiomegaly or pericardial effusion. Moderate atherosclerotic calcification of the aorta. No aneurysmal dilatation or dissection. The origins of the great vessels of the aortic arch appear patent as visualized. No pulmonary artery embolus identified. Mediastinum/Nodes: There is no hilar or mediastinal adenopathy. The esophagus and the thyroid gland are grossly unremarkable. No mediastinal fluid collection. Lungs/Pleura: Minimal bibasilar dependent atelectasis. No focal consolidation, pleural effusion, pneumothorax. The central airways are patent. Upper Abdomen: No acute abnormality. Musculoskeletal: No chest  wall abnormality. No acute or significant osseous findings. Review of the MIP images confirms the above findings. IMPRESSION: No acute intrathoracic pathology. No CT evidence of pulmonary embolism. Electronically Signed   By: Anner Crete M.D.   On: 12/23/2019 22:33    EKG: Sinus tachycardia with left atrial enlargement  Weights: Filed Weights   12/23/19 1937 12/24/19 1334  Weight: 70.3  kg 70.2 kg     Physical Exam: Blood pressure 137/78, pulse 70, temperature 98.3 F (36.8 C), temperature source Oral, resp. rate 17, height 5\' 8"  (1.727 m), weight 70.2 kg, SpO2 98 %. Body mass index is 23.52 kg/m. General: Well developed, well nourished, in no acute distress. Head eyes ears nose throat: Normocephalic, atraumatic, sclera non-icteric, no xanthomas, nares are without discharge. No apparent thyromegaly and/or mass  Lungs: Normal respiratory effort.  no wheezes, no rales, no rhonchi.  Heart: RRR with normal S1 S2. no murmur gallop, no rub, PMI is normal size and placement, carotid upstroke normal without bruit, jugular venous pressure is normal Abdomen: Soft, non-tender, non-distended with normoactive bowel sounds. No hepatomegaly. No rebound/guarding. No obvious abdominal masses. Abdominal aorta is normal size without bruit Extremities: No edema. no cyanosis, no clubbing, no ulcers  Peripheral : 2+ bilateral upper extremity pulses, 2+ bilateral femoral pulses, 2+ bilateral dorsal pedal pulse Neuro: Alert and oriented. No facial asymmetry. No focal deficit. Moves all extremities spontaneously. Musculoskeletal: Normal muscle tone without kyphosis Psych:  Responds to questions appropriately with a normal affect.    Assessment: 59 year old male with peripheral vascular disease hypertension hyperlipidemia on appropriate medication management now with a non-ST elevation myocardial infarction  Plan: 1.  Heparin for further risk reduction in myocardial infarction with aspirin 2.  Serial ECG  and enzymes to assess for myocardial infarction extent 3.  Echocardiogram for LV systolic dysfunction valvular heart disease contributing to above 4.  Proceed to cardiac catheterization to assess coronary anatomy and further treatment thereof is necessary.  Patient understands the risk and benefits of cardiac catheterization.  This includes the possibility of death stroke heart attack infection bleeding or blood clot.  He is at low risk for conscious sedation  Signed, Corey Skains M.D. [facility] 12/24/2019, 7:50 PM

## 2019-12-24 NOTE — ED Notes (Signed)
Pharm called for meds to be verified

## 2019-12-24 NOTE — ED Notes (Signed)
Pt initially refused medication dosage but this was corrected and verified with pharmacy. He stated he takes (1) 500mg  tablet of the Keppra Q.D.but he splits this in half & takes the 250mg  of it BID to stop his dizzyness @ 0123 on 12/24/19.  PT TOOK 250mg  OF KEPPRA, PHARMACY NOTIFIED at 0130 ,adjustment should be reflected in the [facility]. [facility] [employer] [facility] were scanned before administration.    MAR will not adjust

## 2019-12-25 ENCOUNTER — Encounter: Payer: Self-pay | Admitting: Anesthesiology

## 2019-12-25 ENCOUNTER — Encounter: Payer: Self-pay | Admitting: Cardiology

## 2019-12-25 ENCOUNTER — Encounter
Admission: EM | Disposition: A | Discharge status: Home / Self Care | Payer: Self-pay | Source: Home / Self Care | Attending: Internal Medicine

## 2019-12-25 HISTORY — PX: LEFT HEART CATH AND CORONARY ANGIOGRAPHY: CATH118249

## 2019-12-25 LAB — TROPONIN I (HIGH SENSITIVITY): Troponin I (High Sensitivity): 173 ng/L (ref <18)

## 2019-12-25 LAB — CBC
HCT: 33.2 % — ABNORMAL LOW (ref 39.0–52.0)
Hemoglobin: 11.1 g/dL — ABNORMAL LOW (ref 13.0–17.0)
MCH: 27.9 pg (ref 26.0–34.0)
MCHC: 33.4 g/dL (ref 30.0–36.0)
MCV: 83.4 fL (ref 80.0–100.0)
Platelets: 240 10*3/uL (ref 150–400)
RBC: 3.98 MIL/uL — ABNORMAL LOW (ref 4.22–5.81)
RDW: 14.1 % (ref 11.5–15.5)
WBC: 7 10*3/uL (ref 4.0–10.5)
nRBC: 0 % (ref 0.0–0.2)

## 2019-12-25 LAB — BASIC METABOLIC PANEL
Anion gap: 5 (ref 5–15)
BUN: 10 mg/dL (ref 6–20)
CO2: 25 mmol/L (ref 22–32)
Calcium: 8.3 mg/dL — ABNORMAL LOW (ref 8.9–10.3)
Chloride: 110 mmol/L (ref 98–111)
Creatinine, Ser: 0.9 mg/dL (ref 0.61–1.24)
GFR calc Af Amer: >60 mL/min (ref >60)
GFR calc non Af Amer: >60 mL/min (ref >60)
Glucose, Bld: 97 mg/dL (ref 70–99)
Potassium: 3.6 mmol/L (ref 3.5–5.1)
Sodium: 140 mmol/L (ref 135–145)

## 2019-12-25 LAB — HEPARIN LEVEL (UNFRACTIONATED): Heparin Unfractionated: 0.89 IU/mL — ABNORMAL HIGH (ref 0.30–0.70)

## 2019-12-25 LAB — APTT: aPTT: 83 seconds — ABNORMAL HIGH (ref 24–36)

## 2019-12-25 SURGERY — LEFT HEART CATH AND CORONARY ANGIOGRAPHY
Anesthesia: Moderate Sedation

## 2019-12-25 MED ORDER — HEPARIN (PORCINE) IN NACL 1000-0.9 UT/500ML-% IV SOLN
INTRAVENOUS | Status: AC
  Filled 2019-12-25: qty 1000

## 2019-12-25 MED ORDER — METOPROLOL TARTRATE 25 MG PO TABS: 25.0000 mg | ORAL_TABLET | Freq: Two times a day (BID) | ORAL | 1 refills | Status: DC

## 2019-12-25 MED ORDER — ACETAMINOPHEN 325 MG PO TABS: 650.0000 mg | ORAL_TABLET | ORAL | Status: DC | PRN

## 2019-12-25 MED ORDER — ONDANSETRON HCL 4 MG/2ML IJ SOLN: 4.0000 mg | Freq: Four times a day (QID) | INTRAMUSCULAR | Status: DC | PRN

## 2019-12-25 MED ORDER — MIDAZOLAM HCL 2 MG/2ML IJ SOLN
INTRAMUSCULAR | Status: AC
  Filled 2019-12-25: qty 2

## 2019-12-25 MED ORDER — ASPIRIN 81 MG PO CHEW
CHEWABLE_TABLET | ORAL | Status: AC
  Filled 2019-12-25: qty 1

## 2019-12-25 MED ORDER — HEPARIN SODIUM (PORCINE) 1000 UNIT/ML IJ SOLN
INTRAMUSCULAR | Status: DC | PRN
  Administered 2019-12-25: 3500 [IU] via INTRAVENOUS

## 2019-12-25 MED ORDER — HYDRALAZINE HCL 20 MG/ML IJ SOLN: 10.0000 mg | INTRAMUSCULAR | Status: AC | PRN

## 2019-12-25 MED ORDER — VERAPAMIL HCL 2.5 MG/ML IV SOLN
INTRAVENOUS | Status: DC | PRN
  Administered 2019-12-25: 2.5 mg via INTRA_ARTERIAL

## 2019-12-25 MED ORDER — CLOPIDOGREL BISULFATE 75 MG PO TABS
75.0000 mg | ORAL_TABLET | Freq: Every day | ORAL | Status: DC
  Administered 2019-12-25: 75 mg via ORAL
  Filled 2019-12-25: qty 1

## 2019-12-25 MED ORDER — ASPIRIN 81 MG PO CHEW
81.0000 mg | CHEWABLE_TABLET | ORAL | Status: AC
  Administered 2019-12-25: 81 mg via ORAL

## 2019-12-25 MED ORDER — IOHEXOL 300 MG/ML  SOLN
INTRAMUSCULAR | Status: DC | PRN
  Administered 2019-12-25: 110 mL

## 2019-12-25 MED ORDER — HEPARIN (PORCINE) IN NACL 1000-0.9 UT/500ML-% IV SOLN
INTRAVENOUS | Status: DC | PRN
  Administered 2019-12-25: 500 mL

## 2019-12-25 MED ORDER — ATORVASTATIN CALCIUM 80 MG PO TABS: 80.0000 mg | ORAL_TABLET | Freq: Every day | ORAL | 1 refills | Status: DC

## 2019-12-25 MED ORDER — ISOSORBIDE MONONITRATE ER 30 MG PO TB24
30.0000 mg | ORAL_TABLET | Freq: Every day | ORAL | Status: DC
  Administered 2019-12-25: 30 mg via ORAL
  Filled 2019-12-25: qty 1

## 2019-12-25 MED ORDER — LABETALOL HCL 5 MG/ML IV SOLN: 10.0000 mg | INTRAVENOUS | Status: AC | PRN

## 2019-12-25 MED ORDER — PROPOFOL 10 MG/ML IV BOLUS
INTRAVENOUS | Status: AC
  Filled 2019-12-25: qty 20

## 2019-12-25 MED ORDER — SODIUM CHLORIDE 0.9 % WEIGHT BASED INFUSION: 3.0000 mL/kg/h | INTRAVENOUS | Status: DC

## 2019-12-25 MED ORDER — VERAPAMIL HCL 2.5 MG/ML IV SOLN
INTRAVENOUS | Status: AC
  Filled 2019-12-25: qty 2

## 2019-12-25 MED ORDER — FENTANYL CITRATE (PF) 100 MCG/2ML IJ SOLN
INTRAMUSCULAR | Status: DC | PRN
  Administered 2019-12-25: 50 ug via INTRAVENOUS

## 2019-12-25 MED ORDER — FENTANYL CITRATE (PF) 100 MCG/2ML IJ SOLN
INTRAMUSCULAR | Status: AC
  Filled 2019-12-25: qty 2

## 2019-12-25 MED ORDER — MIDAZOLAM HCL 2 MG/2ML IJ SOLN
INTRAMUSCULAR | Status: DC | PRN
  Administered 2019-12-25: 1 mg via INTRAVENOUS

## 2019-12-25 MED ORDER — HEPARIN SODIUM (PORCINE) 1000 UNIT/ML IJ SOLN
INTRAMUSCULAR | Status: AC
  Filled 2019-12-25: qty 1

## 2019-12-25 MED ORDER — NITROGLYCERIN 0.4 MG SL SUBL: 0.4000 mg | SUBLINGUAL_TABLET | SUBLINGUAL | 12 refills | Status: DC | PRN

## 2019-12-25 MED ORDER — SODIUM CHLORIDE 0.9 % WEIGHT BASED INFUSION: 1.0000 mL/kg/h | INTRAVENOUS | Status: DC

## 2019-12-25 MED ORDER — ISOSORBIDE MONONITRATE ER 30 MG PO TB24: 30.0000 mg | ORAL_TABLET | Freq: Every day | ORAL | 1 refills | Status: DC

## 2019-12-25 MED ORDER — SODIUM CHLORIDE 0.9 % IV SOLN: 250.0000 mL | INTRAVENOUS | Status: DC | PRN

## 2019-12-25 MED ORDER — CLOPIDOGREL BISULFATE 75 MG PO TABS: 75.0000 mg | ORAL_TABLET | Freq: Every day | ORAL | 1 refills | Status: DC

## 2019-12-25 MED ORDER — SODIUM CHLORIDE 0.9% FLUSH: 3.0000 mL | INTRAVENOUS | Status: DC | PRN

## 2019-12-25 SURGICAL SUPPLY — 9 items
CATH INFINITI 5 FR JL3.5 (CATHETERS) ×2 IMPLANT
CATH INFINITI 5FR ANG PIGTAIL (CATHETERS) ×1 IMPLANT
CATH INFINITI JR4 5F (CATHETERS) ×1 IMPLANT
DEVICE RAD TR BAND REGULAR (VASCULAR PRODUCTS) ×1 IMPLANT
GLIDESHEATH SLEND SS 6F .021 (SHEATH) ×1 IMPLANT
GUIDEWIRE INQWIRE 1.5J.035X260 (WIRE) IMPLANT
INQWIRE 1.5J .035X260CM (WIRE) ×2
KIT MANI 3VAL PERCEP (MISCELLANEOUS) ×2 IMPLANT
PACK ANGIOGRAPHY (CUSTOM PROCEDURE TRAY) ×1 IMPLANT

## 2019-12-25 NOTE — Progress Notes (Signed)
[facility] Cardiology [facility] Encounter Note  Patient: Timothy Garrison / Admit Date: 12/23/2019 / Date of Encounter: 12/25/2019, 8:37 AM   Subjective: Patient overall feeling well after episode of non-ST elevation myocardial infarction well mowing the lawn.  Patient had a peak troponin of 718+ consistent with non-ST elevation myocardial infarction Cardiac catheterization showing normal LV systolic function with ejection fraction of 55% Moderate three-vessel coronary artery disease with 50% right coronary artery 75% obtuse marginal complicated area of the artery and a 65% proximal LAD diagonal takeoff.  No clear evidence of one primary artery causing significant issues at this time  Review of Systems: Positive for: None Negative for: Vision change, hearing change, syncope, dizziness, nausea, vomiting,diarrhea, bloody stool, stomach pain, cough, congestion, diaphoresis, urinary frequency, urinary pain,skin lesions, skin rashes Others previously listed  Objective: Telemetry: Normal sinus rhythm Physical Exam: Blood pressure 131/82, pulse 73, temperature 98.7 F (37.1 C), temperature source Oral, resp. rate (!) 21, height 5\' 8"  (1.727 m), weight 69.9 kg, SpO2 100 %. Body mass index is 23.42 kg/m. General: Well developed, well nourished, in no acute distress. Head: Normocephalic, atraumatic, sclera non-icteric, no xanthomas, nares are without discharge. Neck: No apparent masses Lungs: Normal respirations with no wheezes, no rhonchi, no rales , no crackles   Heart: Regular rate and rhythm, normal S1 S2, no murmur, no rub, no gallop, PMI is normal size and placement, carotid upstroke normal without bruit, jugular venous pressure normal Abdomen: Soft, non-tender, non-distended with normoactive bowel sounds. No hepatosplenomegaly. Abdominal aorta is normal size without bruit Extremities: No edema, no clubbing, no cyanosis, no ulcers,  Peripheral: 2+ radial, 2+ femoral, 2+ dorsal pedal  pulses Neuro: Alert and oriented. Moves all extremities spontaneously. Psych:  Responds to questions appropriately with a normal affect.   Intake/Output Summary (Last 24 hours) at 12/25/2019 0837 Last data filed at 12/24/2019 2158 Gross per 24 hour  Intake 1749.77 ml  Output 0 ml  Net 1749.77 ml    Inpatient Medications:  . [MAR Hold] aspirin EC  81 mg Oral Daily  . [MAR Hold] atorvastatin  80 mg Oral Daily  . [MAR Hold] Chlorhexidine Gluconate Cloth  6 each Topical Daily  . isosorbide mononitrate  30 mg Oral Daily  . [MAR Hold] levETIRAcetam  250 mg Oral BID  . [MAR Hold] lisinopril  20 mg Oral Daily  . [MAR Hold] metoprolol tartrate  25 mg Oral BID  . [MAR Hold] multivitamin with minerals  1 tablet Oral Daily  . [MAR Hold] pantoprazole  40 mg Oral Daily  . [MAR Hold] sodium chloride flush  3 mL Intravenous Q12H   Infusions:  . sodium chloride 100 mL/hr at 12/25/19 0001  . sodium chloride    . [START ON 12/26/2019] sodium chloride 3 mL/kg/hr (12/25/19 0731)   Followed by  . [START ON 12/26/2019] sodium chloride    . heparin Stopped (12/25/19 0724)    Labs: Recent Labs    12/24/19 0620 12/25/19 0531  NA 139 140  K 3.8 3.6  CL 108 110  CO2 24 25  GLUCOSE 96 97  BUN 12 10  CREATININE 0.94 0.90  CALCIUM 8.5* 8.3*   Recent Labs    12/23/19 1940  AST 34  ALT 26  ALKPHOS 59  BILITOT 0.6  PROT 7.6  ALBUMIN 4.1   Recent Labs    12/23/19 1940 12/23/19 1940 12/24/19 0620 12/25/19 0531  WBC 16.5*   < > 9.8 7.0  NEUTROABS 14.2*  --   --   --  HGB 12.8*   < > 11.1* 11.1*  HCT 38.5*   < > 33.1* 33.2*  MCV 84.2   < > 84.0 83.4  PLT 288   < > 253 240   < > = values in this interval not displayed.   No results for input(s): CKTOTAL, CKMB, TROPONINI in the last 72 hours. Invalid input(s): POCBNP No results for input(s): HGBA1C in the last 72 hours.   Weights: Filed Weights   12/23/19 1937 12/24/19 1334 12/25/19 0401  Weight: 70.3 kg 70.2 kg 69.9 kg      Radiology/Studies:  CT HEAD WO CONTRAST  Result Date: 12/23/2019 CLINICAL DATA:  Seizure, nausea, amnesia EXAM: CT HEAD WITHOUT CONTRAST TECHNIQUE: Contiguous axial images were obtained from the base of the skull through the vertex without intravenous contrast. COMPARISON:  05/23/2019 FINDINGS: Brain: Stable encephalomalacia right temporal lobe compatible with prior infarct. No acute infarct or hemorrhage. Lateral ventricles and midline structures are unremarkable. No acute extra-axial fluid collections. No mass effect. Vascular: No hyperdense vessel or unexpected calcification. Skull: Normal. Negative for fracture or focal lesion. Sinuses/Orbits: Mucosal thickening within the left posterior ethmoid air cells. Remaining sinuses are clear. Other: None. IMPRESSION: 1. Stable chronic ischemic change.  No acute intracranial process. Electronically Signed   By: Randa Ngo M.D.   On: 12/23/2019 20:39   CARDIAC CATHETERIZATION  Result Date: 12/25/2019  Prox RCA to Mid RCA lesion is 45% stenosed.  Prox Cx lesion is 60% stenosed with 75% stenosed side branch in 1st Mrg.  Prox LAD to Mid LAD lesion is 65% stenosed.  1st Diag lesion is 55% stenosed.  59 year old male with hypertension hyperlipidemia and peripheral vascular disease on appropriate medication management having acute non-ST elevation myocardial infarction LV with normal LV function ejection fraction of 55% Three-vessel noncritical coronary atherosclerosis needing further medical management and further reassessment of significance physiologically Plan Single antiplatelet therapy for non-ST elevation myocardial infarction in the presence of previous deep venous thrombosis and/or other arterial thromboses on Eliquis High intensity cholesterol therapy Isosorbide for treatment of ischemia Beta-blocker possible [place] calcium channel blocker medication management for hypertension control Ambulation and follow for improvements with physiologic  assessment for potential ischemia and future PCI and stent placement   DG Chest Portable 1 View  Result Date: 12/23/2019 CLINICAL DATA:  Seizure, weakness EXAM: PORTABLE CHEST 1 VIEW COMPARISON:  05/23/2019 FINDINGS: The heart size and mediastinal contours are within normal limits. Both lungs are clear. The visualized skeletal structures are unremarkable. IMPRESSION: No active disease. Electronically Signed   By: Randa Ngo M.D.   On: 12/23/2019 20:39   CT ANGIO CHEST AORTA W/CM & OR WO/CM  Result Date: 12/23/2019 CLINICAL DATA:  59 year old male with remote history of or pharyngeal cancer. Aortic Disease. No trauma. EXAM: CT ANGIOGRAPHY CHEST WITH CONTRAST TECHNIQUE: Multidetector CT imaging of the chest was performed using the standard protocol during bolus administration of intravenous contrast. Multiplanar CT image reconstructions and MIPs were obtained to evaluate the vascular anatomy. CONTRAST:  153mL OMNIPAQUE IOHEXOL 350 MG/ML SOLN COMPARISON:  Chest radiograph dated 12/23/2019. FINDINGS: Cardiovascular: There is no cardiomegaly or pericardial effusion. Moderate atherosclerotic calcification of the aorta. No aneurysmal dilatation or dissection. The origins of the great vessels of the aortic arch appear patent as visualized. No pulmonary artery embolus identified. Mediastinum/Nodes: There is no hilar or mediastinal adenopathy. The esophagus and the thyroid gland are grossly unremarkable. No mediastinal fluid collection. Lungs/Pleura: Minimal bibasilar dependent atelectasis. No focal consolidation, pleural effusion, pneumothorax. The central  airways are patent. Upper Abdomen: No acute abnormality. Musculoskeletal: No chest wall abnormality. No acute or significant osseous findings. Review of the MIP images confirms the above findings. IMPRESSION: No acute intrathoracic pathology. No CT evidence of pulmonary embolism. Electronically Signed   By: Anner Crete M.D.   On: 12/23/2019 22:33      Assessment and Recommendation  59 y.o. male with non-ST elevation myocardial infarction and no current evidence of congestive heart failure with some peripheral vascular disease hypertension hyperlipidemia now improved with symptoms 1.  No further cardiac intervention at this time due to concerns that the patient has moderate three-vessel disease and no clear primary atherosclerotic plaque causing specific event.  Will primarily work with medical management for further risk reduction in symptoms and further assessed by physiologic parameters as outpatient to further address need and further PCI 2.  High intensity cholesterol therapy 3.  Single antiplatelet therapy with Plavix and discontinuation of aspirin due to peripheral vascular disease non-ST elevation myocardial infarction and use of anticoagulation for other peripheral thrombosis 4.  Isosorbide for improvements of coronary ischemia 5.  Begin ambulation this afternoon and follow for improvements of symptoms and possible discharge home if ambulating well without further evidence of symptoms with follow-up next week for further intervention and or physiologic assessment  Signed, Serafina Royals M.D. FACC

## 2019-12-25 NOTE — Progress Notes (Signed)
ANTICOAGULATION CONSULT NOTE - Initial Consult  Pharmacy Consult for heparin Indication: chest pain/ACS  No Known Allergies  Patient Measurements: Height: 5\' 8"  (172.7 cm) Weight: 69.9 kg (154 lb) IBW/kg (Calculated) : 68.4 Heparin Dosing Weight: 70.3 kg  Vital Signs: Temp: 98.7 F (37.1 C) (05/10 0401) Temp Source: Oral (05/10 0401) BP: 131/82 (05/10 0710) Pulse Rate: 73 (05/10 0710)  Labs: Recent Labs    12/23/19 1940 12/23/19 1940 12/23/19 2147 12/23/19 2330 12/24/19 0620 12/24/19 0620 12/24/19 1206 12/24/19 1347 12/24/19 1535 12/25/19 0531  HGB 12.8*   < >  --   --  11.1*  --   --   --   --  11.1*  HCT 38.5*  --   --   --  33.1*  --   --   --   --  33.2*  PLT 288  --   --   --  253  --   --   --   --  240  APTT  --    < >  --  29 80*  --  67*  --   --  83*  LABPROT  --   --   --  13.5  --   --   --   --   --   --   INR  --   --   --  1.1  --   --   --   --   --   --   HEPARINUNFRC  --   --   --  1.53*  --   --   --   --   --  0.89*  CREATININE 1.09  --   --   --  0.94  --   --   --   --  0.90  TROPONINIHS 189*   < >   < >  --  718*   < >  --  371* 344* 173*   < > = values in this interval not displayed.    Estimated Creatinine Clearance: 85.5 mL/min (by C-G formula based on SCr of 0.9 mg/dL).   Medical History: Past Medical History:  Diagnosis Date  . Basal cell carcinoma (BCC) of back   . Basal cell carcinoma (BCC) of nasal sidewall   . Dizziness   . GERD (gastroesophageal reflux disease)   . H/O blood clots   . Hypercholesterolemia   . Hypertension    hx of HBP readings  . Metastatic squamous cell carcinoma ([place]) 2018   bx cervical lymph node  . Vascular abnormality   . VTE (venous thromboembolism) 2018   associated with chemotherapy    Medications:  Scheduled:  . [MAR Hold] aspirin EC  81 mg Oral Daily  . [MAR Hold] atorvastatin  80 mg Oral Daily  . [MAR Hold] Chlorhexidine Gluconate Cloth  6 each Topical Daily  . [MAR Hold] levETIRAcetam   250 mg Oral BID  . [MAR Hold] lisinopril  20 mg Oral Daily  . [MAR Hold] metoprolol tartrate  25 mg Oral BID  . [MAR Hold] multivitamin with minerals  1 tablet Oral Daily  . [MAR Hold] pantoprazole  40 mg Oral Daily  . [MAR Hold] sodium chloride flush  3 mL Intravenous Q12H    Assessment: Patient arrives for AMS was found unresponsive and cold w/ h/o PAD, DVT on eliquis PTA, oropharyngeal cancer s/p chemo, and seizures on keppra (not in [place]) presenting to the ED w/ trops of 189 >> 598 w/ EKG  showing NSR w/ PVCs, CXR, CT head/angio WNL, baseline CBC WNL, aPTT/INR, anti-Xa pending. Patient's last dose of eliquis 5 mg on 05/08 @ 1000. Patient is being started on heparin drip for management of NSTEMI.  Heparin infusion running @ 1000 units/hr 05/09 @ 0630 aPTT 80 seconds therapeutic.  05/09 @ 1206 aPTT 67 seconds 05/10 @ 0531 aPTT 83 seconds. HL 0.89  Goal of Therapy:  APTT 66 - 102 seconds Heparin level 0.3-0.7 units/ml Monitor platelets by anticoagulation protocol: Yes   Plan:  aPTT level is therapeutic not correlating with heparin level yet. Will continue current rate of heparin and recheck aPTT/Heparin level/CBC with AM labs. Plan for a LHC today.   Oswald Hillock, PharmD, BCPS Clinical Pharmacist 12/25/2019 8:13 AM

## 2019-12-25 NOTE — Discharge Summary (Signed)
Physician Discharge Summary  Timothy Garrison [id number] DOB: 03/21/1961 DOA: 12/23/2019  PCP: Einar Pheasant, MD  Admit date: 12/23/2019 Discharge date: 12/25/2019  Admitted From:  Disposition:    Recommendations for Outpatient Follow-up:  1. Follow up with PCP in 1-2 weeks 2. Follow-up with cardiology in 1 week 3. Please obtain BMP/CBC in one week 4. Please follow up on the following pending results: None  Home Health: No Equipment/Devices: None Discharge Condition: Stable CODE STATUS: Full Diet recommendation: Heart Healthy   Brief/Interim Summary: RickeyRaineyis a59 y.o.Caucasian malewith a known history of hypertension, dyslipidemia, GERD, head and neck cancer S/P chemotherapy and VTE, who presented to the emergency room with acute onset of syncope while mowing his lawn with. He has a history of seizures but his wife has not seen any seizure activity during this event. She noted he was clammy and pale.  EKG without any ST changes, initial troponin I 8 9 which peaked at 718 and then trending down.  Cardiology was consulted and he was taken for cardiac catheterization.  Found to have three-vessel disease with 45 to 60% stenosis.  There was no need for PCI at this time and patient was started on Plavix, metoprolol, Lipitor and Imdur.  He will follow-up with cardiology.  We will continue home dose of Keppra and lisinopril.  Discharge Diagnoses:  Active Problems:   NSTEMI (non-ST elevated myocardial infarction) [facility])  Discharge Instructions  Discharge Instructions    AMB Referral to Cardiac Rehabilitation - Phase II   Complete by: As directed    Diagnosis: NSTEMI   Call MD for:  extreme fatigue   Complete by: As directed    Call MD for:  persistant dizziness or light-headedness   Complete by: As directed    Diet - low sodium heart healthy   Complete by: As directed    Discharge instructions   Complete by: As directed    It was pleasure taking care of you. You are  being started on few new drugs due to your heart attack.  Please take it as directed and follow-up with your cardiologist in 1 week. Discussed with your cardiologist as you were also taking Eliquis, and now cardiology wants to take you Plavix.  Both can increase the risk of bleeding.   Increase activity slowly   Complete by: As directed      Allergies as of 12/25/2019   No Known Allergies     Medication List    STOP taking these medications   pravastatin 40 MG tablet Commonly known as: PRAVACHOL     TAKE these medications   apixaban 5 MG Tabs tablet Commonly known as: Eliquis Take 1 tablet (5 mg total) by mouth every 12 (twelve) hours.   atorvastatin 80 MG tablet Commonly known as: LIPITOR Take 1 tablet (80 mg total) by mouth daily.   cholecalciferol 25 MCG (1000 UNIT) tablet Commonly known as: VITAMIN D3 Take 1,000 Units by mouth daily.   clopidogrel 75 MG tablet Commonly known as: PLAVIX Take 1 tablet (75 mg total) by mouth daily. Start taking on: Dec 26, 2019   Denta 5000 Plus 1.1 % Crea dental cream Generic drug: sodium fluoride Place 1 g onto teeth daily.   isosorbide mononitrate 30 MG 24 hr tablet Commonly known as: IMDUR Take 1 tablet (30 mg total) by mouth daily. Start taking on: Dec 26, 2019   levETIRAcetam 500 MG tablet Commonly known as: KEPPRA Take 1 tablet (500 mg total) by mouth 2 (two) times daily.  What changed: how much to take   lisinopril 20 MG tablet Commonly known as: ZESTRIL TAKE 1 TABLET BY MOUTH EVERY DAY   metoprolol tartrate 25 MG tablet Commonly known as: LOPRESSOR Take 1 tablet (25 mg total) by mouth 2 (two) times daily.   multivitamin capsule Take by mouth.   nitroGLYCERIN 0.4 MG SL tablet Commonly known as: NITROSTAT Place 1 tablet (0.4 mg total) under the tongue every 5 (five) minutes x 3 doses as needed for chest pain.   nystatin 100000 UNIT/ML suspension Commonly known as: MYCOSTATIN 3mls - swish and spit tid    omeprazole 20 MG capsule Commonly known as: PRILOSEC Take 1 capsule (20 mg total) by mouth 2 (two) times daily. What changed: when to take this      Follow-up Information    Einar Pheasant, MD Follow up.   Specialty: Internal Medicine Contact information: [id number] [place] [address] [phone]        Corey Skains, MD. Schedule an appointment as soon as possible for a visit.   Specialty: Cardiology Why: To be seen in 1 week Contact information: [address] [facility] [place] [address] [phone]          No Known Allergies  Consultations:  Cardiology  Procedures/Studies: CT HEAD WO CONTRAST  Result Date: 12/23/2019 CLINICAL DATA:  Seizure, nausea, amnesia EXAM: CT HEAD WITHOUT CONTRAST TECHNIQUE: Contiguous axial images were obtained from the base of the skull through the vertex without intravenous contrast. COMPARISON:  05/23/2019 FINDINGS: Brain: Stable encephalomalacia right temporal lobe compatible with prior infarct. No acute infarct or hemorrhage. Lateral ventricles and midline structures are unremarkable. No acute extra-axial fluid collections. No mass effect. Vascular: No hyperdense vessel or unexpected calcification. Skull: Normal. Negative for fracture or focal lesion. Sinuses/Orbits: Mucosal thickening within the left posterior ethmoid air cells. Remaining sinuses are clear. Other: None. IMPRESSION: 1. Stable chronic ischemic change.  No acute intracranial process. Electronically Signed   By: Randa Ngo M.D.   On: 12/23/2019 20:39   CARDIAC CATHETERIZATION  Result Date: 12/25/2019  Prox RCA to Mid RCA lesion is 45% stenosed.  Prox Cx lesion is 60% stenosed with 75% stenosed side branch in 1st Mrg.  Prox LAD to Mid LAD lesion is 65% stenosed.  1st Diag lesion is 55% stenosed.  59 year old male with hypertension hyperlipidemia and peripheral vascular disease on appropriate  medication management having acute non-ST elevation myocardial infarction LV with normal LV function ejection fraction of 55% Three-vessel noncritical coronary atherosclerosis needing further medical management and further reassessment of significance physiologically Plan Single antiplatelet therapy for non-ST elevation myocardial infarction in the presence of previous deep venous thrombosis and/or other arterial thromboses on Eliquis High intensity cholesterol therapy Isosorbide for treatment of ischemia Beta-blocker possible Ch[place]is calcium channel blocker medication management for hypertension control Ambulation and follow for improvements with physiologic assessment for potential ischemia and future PCI and stent placement   DG Chest Portable 1 View  Result Date: 12/23/2019 CLINICAL DATA:  Seizure, weakness EXAM: PORTABLE CHEST 1 VIEW COMPARISON:  05/23/2019 FINDINGS: The heart size and mediastinal contours are within normal limits. Both lungs are clear. The visualized skeletal structures are unremarkable. IMPRESSION: No active disease. Electronically Signed   By: Randa Ngo M.D.   On: 12/23/2019 20:39   CT ANGIO CHEST AORTA W/CM & OR WO/CM  Result Date: 12/23/2019 CLINICAL DATA:  59 year old male with remote history of or pharyngeal cancer. Aortic Disease. No trauma. EXAM: CT  ANGIOGRAPHY CHEST WITH CONTRAST TECHNIQUE: Multidetector CT imaging of the chest was performed using the standard protocol during bolus administration of intravenous contrast. Multiplanar CT image reconstructions and MIPs were obtained to evaluate the vascular anatomy. CONTRAST:  15mL OMNIPAQUE IOHEXOL 350 MG/ML SOLN COMPARISON:  Chest radiograph dated 12/23/2019. FINDINGS: Cardiovascular: There is no cardiomegaly or pericardial effusion. Moderate atherosclerotic calcification of the aorta. No aneurysmal dilatation or dissection. The origins of the great vessels of the aortic arch appear patent as visualized. No pulmonary artery  embolus identified. Mediastinum/Nodes: There is no hilar or mediastinal adenopathy. The esophagus and the thyroid gland are grossly unremarkable. No mediastinal fluid collection. Lungs/Pleura: Minimal bibasilar dependent atelectasis. No focal consolidation, pleural effusion, pneumothorax. The central airways are patent. Upper Abdomen: No acute abnormality. Musculoskeletal: No chest wall abnormality. No acute or significant osseous findings. Review of the MIP images confirms the above findings. IMPRESSION: No acute intrathoracic pathology. No CT evidence of pulmonary embolism. Electronically Signed   By: Anner Crete M.D.   On: 12/23/2019 22:33     Subjective: Patient was seen after cardiac catheterization today.  Has no complaints.  No chest pain or shortness of breath.  He was able to ambulate well without any difficulty and would like to go back home.  His wife in the room.  Discharge Exam: Vitals:   12/25/19 1100 12/25/19 1137  BP: 131/83 132/77  Pulse: 67 68  Resp: 20 18  Temp:  97.7 F (36.5 C)  SpO2: 99% 99%   Vitals:   12/25/19 1017 12/25/19 1030 12/25/19 1100 12/25/19 1137  BP:  134/79 131/83 132/77  Pulse: 62 (!) 35 67 68  Resp: 20 19 20 18   Temp:    97.7 F (36.5 C)  TempSrc:      SpO2: 98% 98% 99% 99%  Weight:      Height:        General: Pt is alert, awake, not in acute distress Cardiovascular: RRR, S1/S2 +, no rubs, no gallops Respiratory: CTA bilaterally, no wheezing, no rhonchi Abdominal: Soft, NT, ND, bowel sounds + Extremities: no edema, no cyanosis   The results of significant diagnostics from this hospitalization (including imaging, microbiology, ancillary and laboratory) are listed below for reference.    Microbiology: Recent Results (from the past 240 hour(s))  Respiratory Panel by RT PCR (Flu A&B, Covid) - Nasopharyngeal Swab     Status: None   Collection Time: 12/23/19  9:23 PM   Specimen: Nasopharyngeal Swab  Result Value Ref Range Status    SARS Coronavirus 2 by RT PCR NEGATIVE NEGATIVE Final    Comment: (NOTE) SARS-CoV-2 target nucleic acids are NOT DETECTED. The SARS-CoV-2 RNA is generally detectable in upper respiratoy specimens during the acute phase of infection. The lowest concentration of SARS-CoV-2 viral copies this assay can detect is 131 copies/mL. A negative result does not preclude SARS-Cov-2 infection and should not be used as the sole basis for treatment or other patient management decisions. A negative result may occur with  improper specimen collection/handling, submission of specimen other than nasopharyngeal swab, presence of viral mutation(s) within the areas targeted by this assay, and inadequate number of viral copies (<131 copies/mL). A negative result must be combined with clinical observations, patient history, and epidemiological information. The expected result is Negative. Fact Sheet for Patients:  PinkCheek.be Fact Sheet for Healthcare Providers:  GravelBags.it This test is not yet ap proved or cleared by the [place] FDA and  has been authorized for detection and/or diagnosis of  SARS-CoV-2 by FDA under an Emergency Use Authorization (EUA). This EUA will remain  in effect (meaning this test can be used) for the duration of the COVID-19 declaration under Section 564(b)(1) of the Act, 21 U.S.C. section 360bbb-3(b)(1), unless the authorization is terminated or revoked sooner.    Influenza A by PCR NEGATIVE NEGATIVE Final   Influenza B by PCR NEGATIVE NEGATIVE Final    Comment: (NOTE) The Xpert Xpress SARS-CoV-2/FLU/RSV assay is intended as an aid in  the diagnosis of influenza from Nasopharyngeal swab specimens and  should not be used as a sole basis for treatment. Nasal washings and  aspirates are unacceptable for Xpert Xpress SARS-CoV-2/FLU/RSV  testing. Fact Sheet for Patients: PinkCheek.be Fact  Sheet for Healthcare Providers: GravelBags.it This test is not yet approved or cleared by the [place] FDA and  has been authorized for detection and/or diagnosis of SARS-CoV-2 by  FDA under an Emergency Use Authorization (EUA). This EUA will remain  in effect (meaning this test can be used) for the duration of the  Covid-19 declaration under Section 564(b)(1) of the Act, 21  U.S.C. section 360bbb-3(b)(1), unless the authorization is  terminated or revoked. Performed at [facility], [address] [place], [place] [address]      Labs: BNP (last 3 results) No results for input(s): BNP in the last 8760 hours. Basic Metabolic Panel: Recent Labs  Lab 12/23/19 1940 12/24/19 0620 12/25/19 0531  NA 136 139 140  K 4.0 3.8 3.6  CL 105 108 110  CO2 22 24 25   GLUCOSE 141* 96 97  BUN 15 12 10   CREATININE 1.09 0.94 0.90  CALCIUM 8.9 8.5* 8.3*   Liver Function Tests: Recent Labs  Lab 12/23/19 1940  AST 34  ALT 26  ALKPHOS 59  BILITOT 0.6  PROT 7.6  ALBUMIN 4.1   No results for input(s): LIPASE, AMYLASE in the last 168 hours. No results for input(s): AMMONIA in the last 168 hours. CBC: Recent Labs  Lab 12/23/19 1940 12/24/19 0620 12/25/19 0531  WBC 16.5* 9.8 7.0  NEUTROABS 14.2*  --   --   HGB 12.8* 11.1* 11.1*  HCT 38.5* 33.1* 33.2*  MCV 84.2 84.0 83.4  PLT 288 253 240   Cardiac Enzymes: No results for input(s): CKTOTAL, CKMB, CKMBINDEX, TROPONINI in the last 168 hours. BNP: Invalid input(s): POCBNP CBG: No results for input(s): GLUCAP in the last 168 hours. D-Dimer No results for input(s): DDIMER in the last 72 hours. Hgb A1c No results for input(s): HGBA1C in the last 72 hours. Lipid Profile Recent Labs    12/24/19 0620  CHOL 208*  HDL 37*  LDLCALC 142*  TRIG 143  CHOLHDL 5.6   Thyroid function studies No results for input(s): TSH, T4TOTAL, T3FREE, THYROIDAB in the last 72 hours.  Invalid input(s):  FREET3 Anemia work up No results for input(s): VITAMINB12, FOLATE, FERRITIN, TIBC, IRON, RETICCTPCT in the last 72 hours. Urinalysis    Component Value Date/Time   COLORURINE STRAW (A) 12/23/2019 2123   APPEARANCEUR CLEAR (A) 12/23/2019 2123   LABSPEC 1.015 12/23/2019 2123   PHURINE 5.0 12/23/2019 2123   GLUCOSEU NEGATIVE 12/23/2019 2123   HGBUR NEGATIVE 12/23/2019 2123   BILIRUBINUR NEGATIVE 12/23/2019 2123   KETONESUR NEGATIVE 12/23/2019 2123   PROTEINUR NEGATIVE 12/23/2019 2123   UROBILINOGEN 0.2 06/20/2008 1801   NITRITE NEGATIVE 12/23/2019 2123   LEUKOCYTESUR NEGATIVE 12/23/2019 2123   Sepsis Labs Invalid input(s): PROCALCITONIN,  WBC,  LACTICIDVEN Microbiology Recent Results (from the  past 240 hour(s))  Respiratory Panel by RT PCR (Flu A&B, Covid) - Nasopharyngeal Swab     Status: None   Collection Time: 12/23/19  9:23 PM   Specimen: Nasopharyngeal Swab  Result Value Ref Range Status   SARS Coronavirus 2 by RT PCR NEGATIVE NEGATIVE Final    Comment: (NOTE) SARS-CoV-2 target nucleic acids are NOT DETECTED. The SARS-CoV-2 RNA is generally detectable in upper respiratoy specimens during the acute phase of infection. The lowest concentration of SARS-CoV-2 viral copies this assay can detect is 131 copies/mL. A negative result does not preclude SARS-Cov-2 infection and should not be used as the sole basis for treatment or other patient management decisions. A negative result may occur with  improper specimen collection/handling, submission of specimen other than nasopharyngeal swab, presence of viral mutation(s) within the areas targeted by this assay, and inadequate number of viral copies (<131 copies/mL). A negative result must be combined with clinical observations, patient history, and epidemiological information. The expected result is Negative. Fact Sheet for Patients:  PinkCheek.be Fact Sheet for Healthcare Providers:   GravelBags.it This test is not yet ap proved or cleared by the [place] FDA and  has been authorized for detection and/or diagnosis of SARS-CoV-2 by FDA under an Emergency Use Authorization (EUA). This EUA will remain  in effect (meaning this test can be used) for the duration of the COVID-19 declaration under Section 564(b)(1) of the Act, 21 U.S.C. section 360bbb-3(b)(1), unless the authorization is terminated or revoked sooner.    Influenza A by PCR NEGATIVE NEGATIVE Final   Influenza B by PCR NEGATIVE NEGATIVE Final    Comment: (NOTE) The Xpert Xpress SARS-CoV-2/FLU/RSV assay is intended as an aid in  the diagnosis of influenza from Nasopharyngeal swab specimens and  should not be used as a sole basis for treatment. Nasal washings and  aspirates are unacceptable for Xpert Xpress SARS-CoV-2/FLU/RSV  testing. Fact Sheet for Patients: PinkCheek.be Fact Sheet for Healthcare Providers: GravelBags.it This test is not yet approved or cleared by the [place] FDA and  has been authorized for detection and/or diagnosis of SARS-CoV-2 by  FDA under an Emergency Use Authorization (EUA). This EUA will remain  in effect (meaning this test can be used) for the duration of the  Covid-19 declaration under Section 564(b)(1) of the Act, 21  U.S.C. section 360bbb-3(b)(1), unless the authorization is  terminated or revoked. Performed at [facility], [address] [place], [place] [address]     Time coordinating discharge: Over 30 minutes  SIGNED:  Lorella Nimrod, MD  [facility] 12/25/2019, 1:57 PM  If 7PM-7AM, please contact night-coverage [email or web address]  This record has been created using Systems analyst. Errors have been sought and corrected,but may not always be located. Such creation errors do not reflect on the standard of care.

## 2019-12-26 ENCOUNTER — Telehealth: Payer: Self-pay

## 2019-12-26 ENCOUNTER — Ambulatory Visit: Payer: Medicare Other | Admitting: Internal Medicine

## 2019-12-26 MED ORDER — LIDOCAINE HCL (PF) 2 % IJ SOLN
INTRAMUSCULAR | Status: AC
  Filled 2019-12-26: qty 5

## 2019-12-26 NOTE — Telephone Encounter (Addendum)
Transition Care Management Follow-up Telephone Call  Date of discharge and from where: 5/10//21 from [facility]  How have you been since you were released from the hospital? States overall doing okay. Denies chest pain, confusion, dizziness and all other symptoms. Eating/drinking without issue. BM/voiding appropriate.    Any questions or concerns? Patient states, "No, I'm okay."  Items Reviewed:  Did the pt receive and understand the discharge instructions provided? Increase activity slowly. Take medications as directed. Follow up with Cardiologist and PCP.  Medications- Patient declines going over medications and states,"I have gone over medications with my pharmacist today and in depth yesterday prior to leaving hospital. I would rather wait until I see Dr. Nicki Reaper to discuss." Confirms he has a good understanding of what and how to take medications. Encouraged to call the office with any question or concern.   Any new allergies since your discharge? None  Dietary orders reviewed? Yes low sodium, heart healthy  Do you have support at home? Yes, wife  Functional Questionnaire: (I = Independent and D = Dependent) ADLs: I  Follow up appointments reviewed:   PCP Hospital f/u appt confirmed?  Scheduled to see Dr. Nicki Reaper on 01/01/20 @ 11:00.  Cardiologist f/u appt scheduled?   Patient plans to call and schedule today. Nurse offered to schedule appointment, declined. Confirmed phone number for Dr. Nehemiah Massed [phone].   Are transportation arrangements needed? None.  If their condition worsens, is the pt aware to call PCP or go to the Emergency Dept.? Yes  Was the patient provided with contact information for the PCP's office or ED? Yes  Was to pt encouraged to call back with questions or concerns? Yes

## 2019-12-28 DIAGNOSIS — I25118 Atherosclerotic heart disease of native coronary artery with other forms of angina pectoris: Secondary | ICD-10-CM | POA: Diagnosis not present

## 2019-12-28 DIAGNOSIS — E782 Mixed hyperlipidemia: Secondary | ICD-10-CM | POA: Diagnosis not present

## 2019-12-28 DIAGNOSIS — I214 Non-ST elevation (NSTEMI) myocardial infarction: Secondary | ICD-10-CM | POA: Diagnosis not present

## 2019-12-28 DIAGNOSIS — I10 Essential (primary) hypertension: Secondary | ICD-10-CM | POA: Diagnosis not present

## 2020-01-01 ENCOUNTER — Other Ambulatory Visit: Payer: Self-pay

## 2020-01-01 ENCOUNTER — Ambulatory Visit (INDEPENDENT_AMBULATORY_CARE_PROVIDER_SITE_OTHER): Payer: Medicare Other | Admitting: Internal Medicine

## 2020-01-01 DIAGNOSIS — E7800 Pure hypercholesterolemia, unspecified: Secondary | ICD-10-CM

## 2020-01-01 DIAGNOSIS — G40909 Epilepsy, unspecified, not intractable, without status epilepticus: Secondary | ICD-10-CM | POA: Diagnosis not present

## 2020-01-01 DIAGNOSIS — K219 Gastro-esophageal reflux disease without esophagitis: Secondary | ICD-10-CM

## 2020-01-01 DIAGNOSIS — Z85819 Personal history of malignant neoplasm of unspecified site of lip, oral cavity, and pharynx: Secondary | ICD-10-CM | POA: Diagnosis not present

## 2020-01-01 DIAGNOSIS — D649 Anemia, unspecified: Secondary | ICD-10-CM

## 2020-01-01 DIAGNOSIS — R42 Dizziness and giddiness: Secondary | ICD-10-CM

## 2020-01-01 DIAGNOSIS — I252 Old myocardial infarction: Secondary | ICD-10-CM | POA: Diagnosis not present

## 2020-01-01 DIAGNOSIS — I10 Essential (primary) hypertension: Secondary | ICD-10-CM

## 2020-01-01 NOTE — Progress Notes (Addendum)
[name], male   DOB: 1961/05/08, 59 y.o.   MRN: [id number]   Subjective:    [name], male    DOB: [birth year], 60 y.o.   MRN: [id number]  HPI This visit occurred during the SARS-CoV-2 public health emergency.  Safety protocols were in place, including screening questions prior to the visit, additional usage of staff PPE, and extensive cleaning of exam room while observing appropriate contact time as indicated for disinfecting solutions.  Patient here for hospital follow up.  He was admitted 12/23/19 - 12/25/19 with acute syncope.  Had been mowing.  No seizure activity noted.  EKG without ST changes.  Troponin elevated - 718.  Cardiology consulted for non ST elevation MI.  Heart catheterization - three vessel disease with 45-60% stenosis.  Elected Conservation officer, nature.  He was prescribed plavix, metoprolol, lipitor and imdur.  He has not started these medications.  He is still taking pravastatin.  On eliquis.  Had previously been on metoprolol and had intolerance - increased fatigue.  Reported never had chest pain, so did not start imdur.  States he feels he is doing well on his current medication regimen.  States blood pressures averaging 120-130/70-90.  No chest pain.  Breathing stable.  No acid reflux.  No abdominal pain or bowel change reported.  Just had f/u with cardiology and has f/u with vascular surgery tomorrow.  Desires not to start the new medication.  Discussed the need to reduce risk factors and need for more strict goal with cholesterol levels.    Past Medical History:  Diagnosis Date  . Basal cell carcinoma (BCC) of back   . Basal cell carcinoma (BCC) of nasal sidewall   . Dizziness   . GERD (gastroesophageal reflux disease)   . H/O blood clots   . Hypercholesterolemia   . Hypertension    hx of HBP readings  . Metastatic squamous cell carcinoma ([place]) 2018   bx cervical lymph node  . Vascular abnormality   . VTE (venous thromboembolism) 2018    associated with chemotherapy   Past Surgical History:  Procedure Laterality Date  . COLONOSCOPY    . ESOPHAGOGASTRODUODENOSCOPY (EGD) WITH PROPOFOL N/A 05/24/2015   Procedure: ESOPHAGOGASTRODUODENOSCOPY (EGD) WITH PROPOFOL;  Surgeon: Manya Silvas, MD;  Location: [facility] ENDOSCOPY;  Service: Endoscopy;  Laterality: N/A;  . IR IMAGING GUIDED PORT INSERTION  12/09/2016  . LEFT HEART CATH AND CORONARY ANGIOGRAPHY N/A 12/25/2019   Procedure: LEFT HEART CATH AND CORONARY ANGIOGRAPHY;  Surgeon: Corey Skains, MD;  Location: [facility] CV LAB;  Service: Cardiovascular;  Laterality: N/A;  . SAVORY DILATION N/A 05/24/2015   Procedure: SAVORY DILATION;  Surgeon: Manya Silvas, MD;  Location: [facility] ENDOSCOPY;  Service: Endoscopy;  Laterality: N/A;   Family History  Problem Relation Age of Onset  . Hyperlipidemia Mother   . Heart disease Mother   . Hypertension Mother   . Alzheimer's disease Mother   . Prostate cancer Father   . Stroke Father    Social History   Socioeconomic History  . Marital status: Married    Spouse name: Not on file  . Number of children: Not on file  . Years of education: Not on file  . Highest education level: Not on file  Occupational History  . Occupation: Dealer  Tobacco Use  . Smoking status: Former Smoker    Quit date: 11/11/1996    Years since quitting: 23.1  . Smokeless tobacco: Never Used  Substance and  Sexual Activity  . Alcohol use: No    Alcohol/week: 0.0 standard drinks    Comment: occasional  . Drug use: No  . Sexual activity: Yes  Other Topics Concern  . Not on file  Social History Narrative  . Not on file   Social Determinants of Health   Financial Resource Strain:   . Difficulty of Paying Living Expenses:   Food Insecurity:   . Worried About Charity fundraiser in the Last Year:   . Arboriculturist in the Last Year:   Transportation Needs:   . Film/video editor (Medical):   Marland Kitchen Lack of Transportation (Non-Medical):     Physical Activity:   . Days of Exercise per Week:   . Minutes of Exercise per Session:   Stress:   . Feeling of Stress :   Social Connections:   . Frequency of Communication with Friends and Family:   . Frequency of Social Gatherings with Friends and Family:   . Attends Religious Services:   . Active Member of Clubs or Organizations:   . Attends Archivist Meetings:   Marland Kitchen Marital Status:     Outpatient Encounter Medications as of 01/01/2020  Medication Sig  . apixaban (ELIQUIS) 5 MG TABS tablet Take 1 tablet (5 mg total) by mouth every 12 (twelve) hours.  Marland Kitchen atorvastatin (LIPITOR) 80 MG tablet Take 1 tablet (80 mg total) by mouth daily.  . cholecalciferol (VITAMIN D3) 25 MCG (1000 UNIT) tablet Take 1,000 Units by mouth daily.  . clopidogrel (PLAVIX) 75 MG tablet Take 1 tablet (75 mg total) by mouth daily.  . DENTA 5000 PLUS 1.1 % CREA dental cream Place 1 g onto teeth daily.  . isosorbide mononitrate (IMDUR) 30 MG 24 hr tablet Take 1 tablet (30 mg total) by mouth daily.  Marland Kitchen levETIRAcetam (KEPPRA) 500 MG tablet Take 1 tablet (500 mg total) by mouth 2 (two) times daily. (Patient taking differently: Take 250 mg by mouth 2 (two) times daily. )  . metoprolol tartrate (LOPRESSOR) 25 MG tablet Take 1 tablet (25 mg total) by mouth 2 (two) times daily.  . Multiple Vitamin (MULTIVITAMIN) capsule Take by mouth.  . nitroGLYCERIN (NITROSTAT) 0.4 MG SL tablet Place 1 tablet (0.4 mg total) under the tongue every 5 (five) minutes x 3 doses as needed for chest pain.  Marland Kitchen nystatin (MYCOSTATIN) 100000 UNIT/ML suspension 53mls - swish and spit tid (Patient not taking: Reported on 12/23/2019)  . omeprazole (PRILOSEC) 20 MG capsule Take 1 capsule (20 mg total) by mouth 2 (two) times daily. (Patient taking differently: Take 20 mg by mouth daily. )  . pravastatin (PRAVACHOL) 80 MG tablet Take 80 mg by mouth at bedtime.  . [DISCONTINUED] lisinopril (ZESTRIL) 20 MG tablet TAKE 1 TABLET BY MOUTH EVERY DAY   No  facility-administered encounter medications on file as of 01/01/2020.    Review of Systems  Constitutional: Negative for appetite change and unexpected weight change.  HENT: Negative for congestion and sinus pressure.   Respiratory: Negative for cough, chest tightness and shortness of breath.   Cardiovascular: Negative for chest pain, palpitations and leg swelling.  Gastrointestinal: Negative for abdominal pain, diarrhea, nausea and vomiting.  Genitourinary: Negative for difficulty urinating and dysuria.  Musculoskeletal: Negative for joint swelling and myalgias.  Skin: Negative for color change and rash.  Neurological: Negative for dizziness, light-headedness and headaches.  Psychiatric/Behavioral: Negative for agitation and dysphoric mood.       Objective:    Physical  Exam Constitutional:      General: He is not in acute distress.    Appearance: Normal appearance. He is well-developed.  HENT:     Head: Normocephalic and atraumatic.     Right Ear: External ear normal.     Left Ear: External ear normal.  Eyes:     General:        Right eye: No discharge.        Left eye: No discharge.     Conjunctiva/sclera: Conjunctivae normal.  Cardiovascular:     Rate and Rhythm: Normal rate and regular rhythm.  Pulmonary:     Effort: Pulmonary effort is normal. No respiratory distress.     Breath sounds: Normal breath sounds.  Abdominal:     General: Bowel sounds are normal.     Palpations: Abdomen is soft.     Tenderness: There is no abdominal tenderness.  Musculoskeletal:        General: No swelling or tenderness.     Cervical back: Neck supple. No rigidity or tenderness.  Skin:    Findings: No erythema or rash.  Neurological:     Mental Status: He is alert.  Psychiatric:        Mood and Affect: Mood normal.        Behavior: Behavior normal.     BP 130/78   Pulse 85   Temp 98 F (36.7 C)   Resp 16   Ht 5\' 7"  (1.702 m)   Wt 151 lb (68.5 kg)   SpO2 97%   BMI 23.65  kg/m  Wt Readings from Last 3 Encounters:  01/01/20 151 lb (68.5 kg)  12/25/19 154 lb (69.9 kg)  08/29/19 160 lb (72.6 kg)     Lab Results  Component Value Date   WBC 7.0 12/25/2019   HGB 11.1 (L) 12/25/2019   HCT 33.2 (L) 12/25/2019   PLT 240 12/25/2019   GLUCOSE 97 12/25/2019   CHOL 208 (H) 12/24/2019   TRIG 143 12/24/2019   HDL 37 (L) 12/24/2019   LDLDIRECT 204.0 05/29/2019   LDLCALC 142 (H) 12/24/2019   ALT 26 12/23/2019   AST 34 12/23/2019   NA 140 12/25/2019   K 3.6 12/25/2019   CL 110 12/25/2019   CREATININE 0.90 12/25/2019   BUN 10 12/25/2019   CO2 25 12/25/2019   TSH 1.60 02/16/2019   PSA 0.85 02/16/2019   INR 1.1 12/23/2019   HGBA1C 6.0 (H) 05/23/2019    CT HEAD WO CONTRAST  Result Date: 12/23/2019 CLINICAL DATA:  Seizure, nausea, amnesia EXAM: CT HEAD WITHOUT CONTRAST TECHNIQUE: Contiguous axial images were obtained from the base of the skull through the vertex without intravenous contrast. COMPARISON:  05/23/2019 FINDINGS: Brain: Stable encephalomalacia right temporal lobe compatible with prior infarct. No acute infarct or hemorrhage. Lateral ventricles and midline structures are unremarkable. No acute extra-axial fluid collections. No mass effect. Vascular: No hyperdense vessel or unexpected calcification. Skull: Normal. Negative for fracture or focal lesion. Sinuses/Orbits: Mucosal thickening within the left posterior ethmoid air cells. Remaining sinuses are clear. Other: None. IMPRESSION: 1. Stable chronic ischemic change.  No acute intracranial process. Electronically Signed   By: Randa Ngo M.D.   On: 12/23/2019 20:39   DG Chest Portable 1 View  Result Date: 12/23/2019 CLINICAL DATA:  Seizure, weakness EXAM: PORTABLE CHEST 1 VIEW COMPARISON:  05/23/2019 FINDINGS: The heart size and mediastinal contours are within normal limits. Both lungs are clear. The visualized skeletal structures are unremarkable. IMPRESSION: No active disease. Electronically  Signed    By: Randa Ngo M.D.   On: 12/23/2019 20:39   CT ANGIO CHEST AORTA W/CM & OR WO/CM  Result Date: 12/23/2019 CLINICAL DATA:  59 year old male with remote history of or pharyngeal cancer. Aortic Disease. No trauma. EXAM: CT ANGIOGRAPHY CHEST WITH CONTRAST TECHNIQUE: Multidetector CT imaging of the chest was performed using the standard protocol during bolus administration of intravenous contrast. Multiplanar CT image reconstructions and MIPs were obtained to evaluate the vascular anatomy. CONTRAST:  130mL OMNIPAQUE IOHEXOL 350 MG/ML SOLN COMPARISON:  Chest radiograph dated 12/23/2019. FINDINGS: Cardiovascular: There is no cardiomegaly or pericardial effusion. Moderate atherosclerotic calcification of the aorta. No aneurysmal dilatation or dissection. The origins of the great vessels of the aortic arch appear patent as visualized. No pulmonary artery embolus identified. Mediastinum/Nodes: There is no hilar or mediastinal adenopathy. The esophagus and the thyroid gland are grossly unremarkable. No mediastinal fluid collection. Lungs/Pleura: Minimal bibasilar dependent atelectasis. No focal consolidation, pleural effusion, pneumothorax. The central airways are patent. Upper Abdomen: No acute abnormality. Musculoskeletal: No chest wall abnormality. No acute or significant osseous findings. Review of the MIP images confirms the above findings. IMPRESSION: No acute intrathoracic pathology. No CT evidence of pulmonary embolism. Electronically Signed   By: Anner Crete M.D.   On: 12/23/2019 22:33       Assessment & Plan:   Problem List Items Addressed This Visit    Anemia    Decreased hgb while in hospital.  Plan for recheck cbc.       Dizziness    On keppra.  Followed by neurology.  Feels stable.       Essential hypertension, benign    Blood pressure as outlined.  On lisinopril.  Prefers to hold on metoprolol given previous intolerance.  Follow pressures.  Follow metabolic panel.       Relevant  Medications   pravastatin (PRAVACHOL) 80 MG tablet   GERD (gastroesophageal reflux disease)    Controlled on omeprazole.        History of malignant neoplasm of oropharynx    S/p chemo and XRT.  Followed by oncology.  Stable.       History of non-ST elevation myocardial infarction (NSTEMI)    Recently admitted and found to have non STEMI.  Cath as outlined.  Three vessel disease 45-60%.  Elected Conservation officer, nature.  Had intolerance to metoprolol previously.  Declines.  On eliquis.  Concerned regarding taking plavix.  Will clarify with cardiology and vascular surgery.  Not taking imdur - states never had chest pain.  Did agree to increase pravastatin.  Currently without pain.  Feels good.  Continue risk factor modification.       Hypercholesterolemia    Previously had intolerance to lipitor and crestor.  Has tolerated pravastatin.  Discussed increased dose of pravastatin 80mg .  He is agreeable to take.  Low cholesterol diet and exercise.  Follow lipid panel and liver function tests.        Relevant Medications   pravastatin (PRAVACHOL) 80 MG tablet   Seizure disorder ([place])    Doing well on keppra.  Followed by neurology.  No seizure activity noted with recent syncope.           Addendum:  Discussed with Dr Nehemiah Massed and discussed with patient after his visit with Dr Ezzard Flax - regarding his medication.  Per cardiology, it is recommended for him to be on a blood thinner - eliquis and an anti platelet medication - plavix.  Discussed with Mr Graul the  recommendation.  Explained:  NSTEMI and reasoning for above.  Discussed possible side effects, risk and benefit. Discussed recommendation for plavix and eliquis.   Did discuss if desires not to take plavix and eliquis, recommend aspirin and plavix.  He wants to clarify with vascular.    Einar Pheasant, MD

## 2020-01-04 DIAGNOSIS — I70219 Atherosclerosis of native arteries of extremities with intermittent claudication, unspecified extremity: Secondary | ICD-10-CM | POA: Diagnosis not present

## 2020-01-04 DIAGNOSIS — I739 Peripheral vascular disease, unspecified: Secondary | ICD-10-CM | POA: Diagnosis not present

## 2020-01-11 ENCOUNTER — Telehealth: Payer: Self-pay | Admitting: Internal Medicine

## 2020-01-11 MED ORDER — LISINOPRIL 20 MG PO TABS: 20.0000 mg | ORAL_TABLET | Freq: Every day | ORAL | 11 refills | Status: DC

## 2020-01-11 NOTE — Telephone Encounter (Signed)
Pt needs a refill on lisinopril (ZESTRIL) 20 MG tablet sent to [facility]

## 2020-01-15 ENCOUNTER — Encounter: Payer: Self-pay | Admitting: Internal Medicine

## 2020-01-15 NOTE — Assessment & Plan Note (Signed)
On keppra.  Followed by neurology.  Feels stable.

## 2020-01-15 NOTE — Assessment & Plan Note (Signed)
Controlled on omeprazole.   

## 2020-01-15 NOTE — Assessment & Plan Note (Signed)
Recently admitted and found to have non STEMI.  Cath as outlined.  Three vessel disease 45-60%.  Elected Conservation officer, nature.  Had intolerance to metoprolol previously.  Declines.  On eliquis.  Concerned regarding taking plavix.  Will clarify with cardiology and vascular surgery.  Not taking imdur - states never had chest pain.  Did agree to increase pravastatin.  Currently without pain.  Feels good.  Continue risk factor modification.

## 2020-01-15 NOTE — Assessment & Plan Note (Signed)
Previously had intolerance to lipitor and crestor.  Has tolerated pravastatin.  Discussed increased dose of pravastatin 80mg .  He is agreeable to take.  Low cholesterol diet and exercise.  Follow lipid panel and liver function tests.

## 2020-01-15 NOTE — Assessment & Plan Note (Signed)
Doing well on keppra.  Followed by neurology.  No seizure activity noted with recent syncope.

## 2020-01-15 NOTE — Assessment & Plan Note (Signed)
Decreased hgb while in hospital.  Plan for recheck cbc.

## 2020-01-15 NOTE — Assessment & Plan Note (Signed)
Blood pressure as outlined.  On lisinopril.  Prefers to hold on metoprolol given previous intolerance.  Follow pressures.  Follow metabolic panel.

## 2020-01-15 NOTE — Assessment & Plan Note (Signed)
S/p chemo and XRT.  Followed by oncology.  Stable.

## 2020-01-22 DIAGNOSIS — Z85818 Personal history of malignant neoplasm of other sites of lip, oral cavity, and pharynx: Secondary | ICD-10-CM | POA: Diagnosis not present

## 2020-01-22 DIAGNOSIS — R221 Localized swelling, mass and lump, neck: Secondary | ICD-10-CM | POA: Diagnosis not present

## 2020-02-01 DIAGNOSIS — I214 Non-ST elevation (NSTEMI) myocardial infarction: Secondary | ICD-10-CM | POA: Diagnosis not present

## 2020-02-01 DIAGNOSIS — I25118 Atherosclerotic heart disease of native coronary artery with other forms of angina pectoris: Secondary | ICD-10-CM | POA: Diagnosis not present

## 2020-02-08 DIAGNOSIS — E782 Mixed hyperlipidemia: Secondary | ICD-10-CM | POA: Diagnosis not present

## 2020-02-08 DIAGNOSIS — I2510 Atherosclerotic heart disease of native coronary artery without angina pectoris: Secondary | ICD-10-CM | POA: Diagnosis not present

## 2020-02-08 DIAGNOSIS — I10 Essential (primary) hypertension: Secondary | ICD-10-CM | POA: Diagnosis not present

## 2020-02-12 ENCOUNTER — Other Ambulatory Visit: Payer: Self-pay | Admitting: Internal Medicine

## 2020-03-14 DIAGNOSIS — R110 Nausea: Secondary | ICD-10-CM | POA: Diagnosis not present

## 2020-03-14 DIAGNOSIS — R42 Dizziness and giddiness: Secondary | ICD-10-CM | POA: Diagnosis not present

## 2020-03-14 DIAGNOSIS — R112 Nausea with vomiting, unspecified: Secondary | ICD-10-CM | POA: Diagnosis not present

## 2020-03-14 DIAGNOSIS — Z5181 Encounter for therapeutic drug level monitoring: Secondary | ICD-10-CM | POA: Diagnosis not present

## 2020-03-14 DIAGNOSIS — R000 Tachycardia, unspecified: Secondary | ICD-10-CM | POA: Diagnosis not present

## 2020-03-14 DIAGNOSIS — R0789 Other chest pain: Secondary | ICD-10-CM | POA: Diagnosis not present

## 2020-03-14 DIAGNOSIS — H5500 Unspecified nystagmus: Secondary | ICD-10-CM | POA: Diagnosis not present

## 2020-03-14 DIAGNOSIS — R404 Transient alteration of awareness: Secondary | ICD-10-CM | POA: Diagnosis not present

## 2020-03-14 DIAGNOSIS — Z79899 Other long term (current) drug therapy: Secondary | ICD-10-CM | POA: Diagnosis not present

## 2020-03-14 DIAGNOSIS — Z87891 Personal history of nicotine dependence: Secondary | ICD-10-CM | POA: Diagnosis not present

## 2020-03-14 DIAGNOSIS — I639 Cerebral infarction, unspecified: Secondary | ICD-10-CM | POA: Diagnosis not present

## 2020-03-14 DIAGNOSIS — I10 Essential (primary) hypertension: Secondary | ICD-10-CM | POA: Diagnosis not present

## 2020-03-14 DIAGNOSIS — I6523 Occlusion and stenosis of bilateral carotid arteries: Secondary | ICD-10-CM | POA: Diagnosis not present

## 2020-03-14 DIAGNOSIS — R251 Tremor, unspecified: Secondary | ICD-10-CM | POA: Diagnosis not present

## 2020-03-14 DIAGNOSIS — R569 Unspecified convulsions: Secondary | ICD-10-CM | POA: Diagnosis not present

## 2020-03-27 ENCOUNTER — Telehealth: Payer: Self-pay | Admitting: *Deleted

## 2020-03-27 DIAGNOSIS — D649 Anemia, unspecified: Secondary | ICD-10-CM

## 2020-03-27 DIAGNOSIS — Z125 Encounter for screening for malignant neoplasm of prostate: Secondary | ICD-10-CM

## 2020-03-27 DIAGNOSIS — E7800 Pure hypercholesterolemia, unspecified: Secondary | ICD-10-CM

## 2020-03-27 DIAGNOSIS — I10 Essential (primary) hypertension: Secondary | ICD-10-CM

## 2020-03-27 NOTE — Telephone Encounter (Signed)
Please place future orders for lab appt.  

## 2020-03-27 NOTE — Telephone Encounter (Signed)
Orders placed for labs

## 2020-04-02 ENCOUNTER — Other Ambulatory Visit: Payer: Self-pay

## 2020-04-02 ENCOUNTER — Other Ambulatory Visit (INDEPENDENT_AMBULATORY_CARE_PROVIDER_SITE_OTHER): Payer: Medicare Other

## 2020-04-02 DIAGNOSIS — D649 Anemia, unspecified: Secondary | ICD-10-CM

## 2020-04-02 DIAGNOSIS — I10 Essential (primary) hypertension: Secondary | ICD-10-CM

## 2020-04-02 DIAGNOSIS — Z125 Encounter for screening for malignant neoplasm of prostate: Secondary | ICD-10-CM | POA: Diagnosis not present

## 2020-04-02 DIAGNOSIS — E7800 Pure hypercholesterolemia, unspecified: Secondary | ICD-10-CM

## 2020-04-02 LAB — HEPATIC FUNCTION PANEL
ALT: 27 U/L (ref 0–53)
AST: 24 U/L (ref 0–37)
Albumin: 4.5 g/dL (ref 3.5–5.2)
Alkaline Phosphatase: 62 U/L (ref 39–117)
Bilirubin, Direct: 0.1 mg/dL (ref 0.0–0.3)
Total Bilirubin: 0.5 mg/dL (ref 0.2–1.2)
Total Protein: 7.5 g/dL (ref 6.0–8.3)

## 2020-04-02 LAB — CBC WITH DIFFERENTIAL/PLATELET
Basophils Absolute: 0.1 10*3/uL (ref 0.0–0.1)
Basophils Relative: 0.9 % (ref 0.0–3.0)
Eosinophils Absolute: 0.2 10*3/uL (ref 0.0–0.7)
Eosinophils Relative: 3.7 % (ref 0.0–5.0)
HCT: 40.3 % (ref 39.0–52.0)
Hemoglobin: 13.4 g/dL (ref 13.0–17.0)
Lymphocytes Relative: 29.1 % (ref 12.0–46.0)
Lymphs Abs: 1.8 10*3/uL (ref 0.7–4.0)
MCHC: 33.3 g/dL (ref 30.0–36.0)
MCV: 84.6 fl (ref 78.0–100.0)
Monocytes Absolute: 0.6 10*3/uL (ref 0.1–1.0)
Monocytes Relative: 10 % (ref 3.0–12.0)
Neutro Abs: 3.5 10*3/uL (ref 1.4–7.7)
Neutrophils Relative %: 56.3 % (ref 43.0–77.0)
Platelets: 329 10*3/uL (ref 150.0–400.0)
RBC: 4.76 Mil/uL (ref 4.22–5.81)
RDW: 14 % (ref 11.5–15.5)
WBC: 6.3 10*3/uL (ref 4.0–10.5)

## 2020-04-02 LAB — IBC + FERRITIN
Ferritin: 27.8 ng/mL (ref 22.0–322.0)
Iron: 65 ug/dL (ref 42–165)
Saturation Ratios: 20.8 % (ref 20.0–50.0)
Transferrin: 223 mg/dL (ref 212.0–360.0)

## 2020-04-02 LAB — BASIC METABOLIC PANEL
BUN: 15 mg/dL (ref 6–23)
CO2: 29 mEq/L (ref 19–32)
Calcium: 10.2 mg/dL (ref 8.4–10.5)
Chloride: 101 mEq/L (ref 96–112)
Creatinine, Ser: 1.13 mg/dL (ref 0.40–1.50)
GFR: 66.25 mL/min (ref >60.00)
Glucose, Bld: 95 mg/dL (ref 70–99)
Potassium: 4.9 mEq/L (ref 3.5–5.1)
Sodium: 138 mEq/L (ref 135–145)

## 2020-04-02 LAB — TSH: TSH: 1.99 u[IU]/mL (ref 0.35–4.50)

## 2020-04-02 LAB — PSA: PSA: 0.94 ng/mL (ref 0.10–4.00)

## 2020-04-02 LAB — LIPID PANEL
Cholesterol: 238 mg/dL — ABNORMAL HIGH (ref 0–200)
HDL: 39.9 mg/dL (ref >39.00)
NonHDL: 197.69
Total CHOL/HDL Ratio: 6
Triglycerides: 227 mg/dL — ABNORMAL HIGH (ref 0.0–149.0)
VLDL: 45.4 mg/dL — ABNORMAL HIGH (ref 0.0–40.0)

## 2020-04-02 LAB — VITAMIN B12: Vitamin B-12: 428 pg/mL (ref 211–911)

## 2020-04-02 LAB — LDL CHOLESTEROL, DIRECT: Direct LDL: 150 mg/dL

## 2020-04-04 ENCOUNTER — Telehealth (INDEPENDENT_AMBULATORY_CARE_PROVIDER_SITE_OTHER): Payer: Medicare Other | Admitting: Internal Medicine

## 2020-04-04 ENCOUNTER — Encounter: Payer: Self-pay | Admitting: Internal Medicine

## 2020-04-04 DIAGNOSIS — E7800 Pure hypercholesterolemia, unspecified: Secondary | ICD-10-CM

## 2020-04-04 DIAGNOSIS — G40909 Epilepsy, unspecified, not intractable, without status epilepticus: Secondary | ICD-10-CM | POA: Diagnosis not present

## 2020-04-04 DIAGNOSIS — K219 Gastro-esophageal reflux disease without esophagitis: Secondary | ICD-10-CM | POA: Diagnosis not present

## 2020-04-04 DIAGNOSIS — I10 Essential (primary) hypertension: Secondary | ICD-10-CM | POA: Diagnosis not present

## 2020-04-04 DIAGNOSIS — D649 Anemia, unspecified: Secondary | ICD-10-CM

## 2020-04-04 DIAGNOSIS — Z85819 Personal history of malignant neoplasm of unspecified site of lip, oral cavity, and pharynx: Secondary | ICD-10-CM | POA: Diagnosis not present

## 2020-04-04 MED ORDER — ROSUVASTATIN CALCIUM 40 MG PO TABS: 40.0000 mg | ORAL_TABLET | Freq: Every day | ORAL | 2 refills | Status: DC

## 2020-04-04 NOTE — Progress Notes (Signed)
Patient ID: [name], male   DOB: 06/09/1961, 59 y.o.   MRN: [id number]   Virtual Visit via telephone Note  This visit type was conducted due to national recommendations for restrictions regarding the COVID-19 pandemic (e.g. social distancing).  This format is felt to be most appropriate for this patient at this time.  All issues noted in this document were discussed and addressed.  No physical exam was performed (except for noted visual exam findings with Video Visits).   I connected with Timothy Garrison by telephone and verified that I am speaking with the correct person using two identifiers. Location patient: home Location provider: work or home office Persons participating in the virtual visit: patient, provider  The limitations, risks, security and privacy concerns of performing an evaluation and management service by telephone and the availability of in person appointments have been discussed. It has also been discussed with the patient that there may be a patient responsible charge related to this service. The patient expressed understanding and agreed to proceed.   Reason for visit: scheduled follow up.    HPI: He was evaluated recently in ER with reports of confusion, unable to follow commands and did not know how to do certain tasks, could not cut banana, couldn't put mask on, etc.  On keppra for seizures.  Unable to tolerate higher dose of keppra.  Neurology consulted and recommended starting zonisamide.  Did not tolerate zonisamide.  States caused him to have double vision.  He is back on low dose keppra.  Has f/u with neurology scheduled for 04/2020.  He denies any chest pain or chest tightness.  No sob reported.  Discussed labs.  Taking his cholesterol medication regularly.  Discussed changing to crestor 40mg  q day.  He is in agreement.  Blood pressure 130/79.  Overall feels back to his baseline.  No further seizures or altered mental status since his ER visit.    ROS: See  pertinent positives and negatives per HPI.  Past Medical History:  Diagnosis Date  . Basal cell carcinoma (BCC) of back   . Basal cell carcinoma (BCC) of nasal sidewall   . Dizziness   . GERD (gastroesophageal reflux disease)   . H/O blood clots   . Hypercholesterolemia   . Hypertension    hx of HBP readings  . Metastatic squamous cell carcinoma ([place]) 2018   bx cervical lymph node  . Vascular abnormality   . VTE (venous thromboembolism) 2018   associated with chemotherapy    Past Surgical History:  Procedure Laterality Date  . COLONOSCOPY    . ESOPHAGOGASTRODUODENOSCOPY (EGD) WITH PROPOFOL N/A 05/24/2015   Procedure: ESOPHAGOGASTRODUODENOSCOPY (EGD) WITH PROPOFOL;  Surgeon: Manya Silvas, MD;  Location: [facility] ENDOSCOPY;  Service: Endoscopy;  Laterality: N/A;  . IR IMAGING GUIDED PORT INSERTION  12/09/2016  . LEFT HEART CATH AND CORONARY ANGIOGRAPHY N/A 12/25/2019   Procedure: LEFT HEART CATH AND CORONARY ANGIOGRAPHY;  Surgeon: Corey Skains, MD;  Location: [place] CV LAB;  Service: Cardiovascular;  Laterality: N/A;  . SAVORY DILATION N/A 05/24/2015   Procedure: SAVORY DILATION;  Surgeon: Manya Silvas, MD;  Location: [facility] ENDOSCOPY;  Service: Endoscopy;  Laterality: N/A;    Family History  Problem Relation Age of Onset  . Hyperlipidemia Mother   . Heart disease Mother   . Hypertension Mother   . Alzheimer's disease Mother   . Prostate cancer Father   . Stroke Father     SOCIAL HX: reviewed.    Current  Outpatient Medications:  .  cholecalciferol (VITAMIN D3) 25 MCG (1000 UNIT) tablet, Take 1,000 Units by mouth daily., Disp: , Rfl:  .  clopidogrel (PLAVIX) 75 MG tablet, Take 1 tablet (75 mg total) by mouth daily., Disp: 30 tablet, Rfl: 1 .  DENTA 5000 PLUS 1.1 % CREA dental cream, Place 1 g onto teeth daily., Disp: , Rfl:  .  ELIQUIS 5 MG TABS tablet, TAKE 1 TABLET BY MOUTH EVERY 12 HOURS, Disp: 180 tablet, Rfl: 1 .  isosorbide mononitrate (IMDUR) 30 MG 24 hr  tablet, Take 1 tablet (30 mg total) by mouth daily., Disp: 30 tablet, Rfl: 1 .  levETIRAcetam (KEPPRA) 500 MG tablet, Take 1 tablet (500 mg total) by mouth 2 (two) times daily. (Patient taking differently: Take 250 mg by mouth 2 (two) times daily. ), Disp: 60 tablet, Rfl: 1 .  lisinopril (ZESTRIL) 20 MG tablet, Take 1 tablet (20 mg total) by mouth daily., Disp: 30 tablet, Rfl: 11 .  metoprolol tartrate (LOPRESSOR) 25 MG tablet, Take 1 tablet (25 mg total) by mouth 2 (two) times daily., Disp: 60 tablet, Rfl: 1 .  Multiple Vitamin (MULTIVITAMIN) capsule, Take by mouth., Disp: , Rfl:  .  nitroGLYCERIN (NITROSTAT) 0.4 MG SL tablet, Place 1 tablet (0.4 mg total) under the tongue every 5 (five) minutes x 3 doses as needed for chest pain., Disp: 30 tablet, Rfl: 12 .  nystatin (MYCOSTATIN) 100000 UNIT/ML suspension, 26mls - swish and spit tid (Patient not taking: Reported on 12/23/2019), Disp: 60 mL, Rfl: 0 .  omeprazole (PRILOSEC) 20 MG capsule, Take 1 capsule (20 mg total) by mouth 2 (two) times daily. (Patient taking differently: Take 20 mg by mouth daily. ), Disp: 180 capsule, Rfl: 2 .  rosuvastatin (CRESTOR) 40 MG tablet, Take 1 tablet (40 mg total) by mouth daily., Disp: 30 tablet, Rfl: 2  EXAM:  GENERAL: alert.  Sounds to be in no acute distress.  Answering questions appropriately.    PSYCH/NEURO: pleasant and cooperative, no obvious depression or anxiety, speech and thought processing grossly intact  ASSESSMENT AND PLAN:  Discussed the following assessment and plan:  Seizure disorder ([place]) Recent confusion as outlined.  On keppra.  Did not tolerate higher doses of keppra and did not tolerate zonisamide.  On keppra 500mg  bid now.  No further seizures or altered mental status since his ER visit.  Has f/u planned with neurology in 04/2020.  Not driving.    Hypercholesterolemia Cholesterol still elevated.  Previously had documented intolerance to crestor and lipitor.  Given persistent elevation on  pravastatin, he is agreeable to try crestor again.  Follow lipid panel and liver function tests.  Send [facility] lipid diet information.   History of malignant neoplasm of oropharynx S/p chemo and XRT.  Followed by oncology.    GERD (gastroesophageal reflux disease) No upper symptoms reported.  On omeprazole.    Essential hypertension, benign Blood pressure as outlined.  On lisinopril.  Follow pressures.  Follow metabolic panel.    Anemia Follow cbc.    Orders Placed This Encounter  Procedures  . Hepatic function panel    Standing Status:   Future    Standing Expiration Date:   04/14/2021    Meds ordered this encounter  Medications  . rosuvastatin (CRESTOR) 40 MG tablet    Sig: Take 1 tablet (40 mg total) by mouth daily.    Dispense:  30 tablet    Refill:  2     I discussed the assessment and treatment  plan with the patient. The patient was provided an opportunity to ask questions and all were answered. The patient agreed with the plan and demonstrated an understanding of the instructions.   The patient was advised to call back or seek an in-person evaluation if the symptoms worsen or if the condition fails to improve as anticipated.  I provided 25 minutes of non-face-to-face time during this encounter.   Einar Pheasant, MD

## 2020-04-14 ENCOUNTER — Telehealth: Payer: Self-pay | Admitting: Internal Medicine

## 2020-04-14 NOTE — Assessment & Plan Note (Addendum)
Cholesterol still elevated.  Previously had documented intolerance to crestor and lipitor.  Given persistent elevation on pravastatin, he is agreeable to try crestor again.  Follow lipid panel and liver function tests.  Send [facility] lipid diet information.

## 2020-04-14 NOTE — Assessment & Plan Note (Signed)
Recent confusion as outlined.  On keppra.  Did not tolerate higher doses of keppra and did not tolerate zonisamide.  On keppra 500mg  bid now.  No further seizures or altered mental status since his ER visit.  Has f/u planned with neurology in 04/2020.  Not driving.

## 2020-04-14 NOTE — Assessment & Plan Note (Signed)
S/p chemo and XRT.  Followed by oncology.  

## 2020-04-14 NOTE — Assessment & Plan Note (Signed)
No upper symptoms reported.  On omeprazole.  

## 2020-04-14 NOTE — Telephone Encounter (Signed)
Please send him [facility] Lipid diet.  Also, notify him that his next lab is a non fasting lab (I think I accidentally put to schedule a fasting lab).

## 2020-04-14 NOTE — Assessment & Plan Note (Signed)
Blood pressure as outlined.  On lisinopril.  Follow pressures.  Follow metabolic panel.  

## 2020-04-14 NOTE — Assessment & Plan Note (Signed)
Follow cbc.  

## 2020-04-15 NOTE — Telephone Encounter (Signed)
Mailed to patient. Notified about lab

## 2020-04-18 DIAGNOSIS — I739 Peripheral vascular disease, unspecified: Secondary | ICD-10-CM | POA: Diagnosis not present

## 2020-04-29 DIAGNOSIS — Z8673 Personal history of transient ischemic attack (TIA), and cerebral infarction without residual deficits: Secondary | ICD-10-CM | POA: Diagnosis not present

## 2020-04-29 DIAGNOSIS — R2689 Other abnormalities of gait and mobility: Secondary | ICD-10-CM | POA: Diagnosis not present

## 2020-04-29 DIAGNOSIS — I6523 Occlusion and stenosis of bilateral carotid arteries: Secondary | ICD-10-CM | POA: Diagnosis not present

## 2020-04-29 DIAGNOSIS — R569 Unspecified convulsions: Secondary | ICD-10-CM | POA: Diagnosis not present

## 2020-05-08 ENCOUNTER — Ambulatory Visit (INDEPENDENT_AMBULATORY_CARE_PROVIDER_SITE_OTHER): Payer: Medicare Other

## 2020-05-08 VITALS — Ht 67.0 in | Wt 153.0 lb

## 2020-05-08 DIAGNOSIS — Z0000 Encounter for general adult medical examination without abnormal findings: Secondary | ICD-10-CM

## 2020-05-08 DIAGNOSIS — Z1159 Encounter for screening for other viral diseases: Secondary | ICD-10-CM

## 2020-05-08 NOTE — Patient Instructions (Addendum)
Mr. Timothy Garrison , Thank you for taking time to come for your Medicare Wellness Visit. I appreciate your ongoing commitment to your health goals. Please review the following plan we discussed and let me know if I can assist you in the future.   These are the goals we discussed: Goals      Patient Stated   .  Lower cholesterol (pt-stated)      Healthy diet Stay active       This is a list of the screening recommended for you and due dates:  Health Maintenance  Topic Date Due  .  Hepatitis C: One time screening is recommended by Center for Disease Control  (CDC) for  adults born from [birth year] through 1966.   Never done  . Tetanus Vaccine  Never done  . COVID-19 Vaccine (2 - Pfizer 2-dose series) 05/14/2020  . Colon Cancer Screening  06/24/2023  . HIV Screening  Completed  . Flu Shot  Discontinued    Immunizations Immunization History  Administered Date(s) Administered  . Influenza-Unspecified 06/17/2018  . PFIZER SARS-COV-2 Vaccination 04/23/2020   Keep all routine maintenance appointments.   Next scheduled lab 05/14/20 @ 9:45  Follow up 06/04/20 @ 8:00  Advanced directives: End of life planning; Advance aging; Advanced directives discussed.  Copy of current HCPOA/Living Will requested.    Follow up in one year for your annual wellness visit.  Preventive Care 40-64 Years, Male Preventive care refers to lifestyle choices and visits with your health care provider that can promote health and wellness. What does preventive care include?  A yearly physical exam. This is also called an annual well check.  Dental exams once or twice a year.  Routine eye exams. Ask your health care provider how often you should have your eyes checked.  Personal lifestyle choices, including:  Daily care of your teeth and gums.  Regular physical activity.  Eating a healthy diet.  Avoiding tobacco and drug use.  Limiting alcohol use.  Practicing safe sex.  Taking low-dose aspirin every day  starting at age 55. What happens during an annual well check? The services and screenings done by your health care provider during your annual well check will depend on your age, overall health, lifestyle risk factors, and family history of disease. Counseling  Your health care provider may ask you questions about your:  Alcohol use.  Tobacco use.  Drug use.  Emotional well-being.  Home and relationship well-being.  Sexual activity.  Eating habits.  Work and work Statistician. Screening  You may have the following tests or measurements:  Height, weight, and BMI.  Blood pressure.  Lipid and cholesterol levels. These may be checked every 5 years, or more frequently if you are over 4 years old.  Skin check.  Lung cancer screening. You may have this screening every year starting at age 24 if you have a 30-pack-year history of smoking and currently smoke or have quit within the past 15 years.  Fecal occult blood test (FOBT) of the stool. You may have this test every year starting at age 74.  Flexible sigmoidoscopy or colonoscopy. You may have a sigmoidoscopy every 5 years or a colonoscopy every 10 years starting at age 36.  Prostate cancer screening. Recommendations will vary depending on your family history and other risks.  Hepatitis C blood test.  Hepatitis B blood test.  Sexually transmitted disease (STD) testing.  Diabetes screening. This is done by checking your blood sugar (glucose) after you have not eaten for  a while (fasting). You may have this done every 1-3 years. Discuss your test results, treatment options, and if necessary, the need for more tests with your health care provider. Vaccines  Your health care provider may recommend certain vaccines, such as:  Influenza vaccine. This is recommended every year.  Tetanus, diphtheria, and acellular pertussis (Tdap, Td) vaccine. You may need a Td booster every 10 years.  Zoster vaccine. You may need this after  age 87.  Pneumococcal 13-valent conjugate (PCV13) vaccine. You may need this if you have certain conditions and have not been vaccinated.  Pneumococcal polysaccharide (PPSV23) vaccine. You may need one or two doses if you smoke cigarettes or if you have certain conditions. Talk to your health care provider about which screenings and vaccines you need and how often you need them. This information is not intended to replace advice given to you by your health care provider. Make sure you discuss any questions you have with your health care provider. Document Released: 08/30/2015 Document Revised: 04/22/2016 Document Reviewed: 06/04/2015 Elsevier Interactive Patient Education  2017 [employer] Prevention in the Home Falls can cause injuries. They can happen to people of all ages. There are many things you can do to make your home safe and to help prevent falls. What can I do on the outside of my home?  Regularly fix the edges of walkways and driveways and fix any cracks.  Remove anything that might make you trip as you walk through a door, such as a raised step or threshold.  Trim any bushes or trees on the path to your home.  Use bright outdoor lighting.  Clear any walking paths of anything that might make someone trip, such as rocks or tools.  Regularly check to see if handrails are loose or broken. Make sure that both sides of any steps have handrails.  Any raised decks and porches should have guardrails on the edges.  Have any leaves, snow, or ice cleared regularly.  Use sand or salt on walking paths during winter.  Clean up any spills in your garage right away. This includes oil or grease spills. What can I do in the bathroom?  Use night lights.  Install grab bars by the toilet and in the tub and shower. Do not use towel bars as grab bars.  Use non-skid mats or decals in the tub or shower.  If you need to sit down in the shower, use a plastic, non-slip  stool.  Keep the floor dry. Clean up any water that spills on the floor as soon as it happens.  Remove soap buildup in the tub or shower regularly.  Attach bath mats securely with double-sided non-slip rug tape.  Do not have throw rugs and other things on the floor that can make you trip. What can I do in the bedroom?  Use night lights.  Make sure that you have a light by your bed that is easy to reach.  Do not use any sheets or blankets that are too big for your bed. They should not hang down onto the floor.  Have a firm chair that has side arms. You can use this for support while you get dressed.  Do not have throw rugs and other things on the floor that can make you trip. What can I do in the kitchen?  Clean up any spills right away.  Avoid walking on wet floors.  Keep items that you use a lot in easy-to-reach places.  If you need to reach something above you, use a strong step stool that has a grab bar.  Keep electrical cords out of the way.  Do not use floor polish or wax that makes floors slippery. If you must use wax, use non-skid floor wax.  Do not have throw rugs and other things on the floor that can make you trip. What can I do with my stairs?  Do not leave any items on the stairs.  Make sure that there are handrails on both sides of the stairs and use them. Fix handrails that are broken or loose. Make sure that handrails are as long as the stairways.  Check any carpeting to make sure that it is firmly attached to the stairs. Fix any carpet that is loose or worn.  Avoid having throw rugs at the top or bottom of the stairs. If you do have throw rugs, attach them to the floor with carpet tape.  Make sure that you have a light switch at the top of the stairs and the bottom of the stairs. If you do not have them, ask someone to add them for you. What else can I do to help prevent falls?  Wear shoes that:  Do not have high heels.  Have rubber bottoms.  Are  comfortable and fit you well.  Are closed at the toe. Do not wear sandals.  If you use a stepladder:  Make sure that it is fully opened. Do not climb a closed stepladder.  Make sure that both sides of the stepladder are locked into place.  Ask someone to hold it for you, if possible.  Clearly mark and make sure that you can see:  Any grab bars or handrails.  First and last steps.  Where the edge of each step is.  Use tools that help you move around (mobility aids) if they are needed. These include:  Canes.  Walkers.  Scooters.  Crutches.  Turn on the lights when you go into a dark area. Replace any light bulbs as soon as they burn out.  Set up your furniture so you have a clear path. Avoid moving your furniture around.  If any of your floors are uneven, fix them.  If there are any pets around you, be aware of where they are.  Review your medicines with your doctor. Some medicines can make you feel dizzy. This can increase your chance of falling. Ask your doctor what other things that you can do to help prevent falls. This information is not intended to replace advice given to you by your health care provider. Make sure you discuss any questions you have with your health care provider. Document Released: 05/30/2009 Document Revised: 01/09/2016 Document Reviewed: 09/07/2014 Elsevier Interactive Patient Education  2017 [employer].

## 2020-05-08 NOTE — Progress Notes (Addendum)
Subjective:   Timothy Garrison is a 59 y.o. male who presents for an Initial Medicare Annual Wellness Visit.  Review of Systems    No ROS.  Medicare Wellness Virtual Visit.   Cardiac Risk Factors include: advanced age (>31men, >70 women);male gender;hypertension     Objective:    Today's Vitals   05/08/20 0906  Weight: 153 lb (69.4 kg)  Height: 5\' 7"  (1.702 m)   Body mass index is 23.96 kg/m.  Advanced Directives 12/24/2019 12/23/2019 10/30/2019 08/04/2019 05/23/2019 05/23/2019 02/21/2018  Does Patient Have a Medical Advance Directive? No;Yes No No Yes Yes Yes No  Type of Advance Directive Living will - - Living will Living will Living will -  Does patient want to make changes to medical advance directive? No - Patient declined - - No - Patient declined No - Patient declined No - Patient declined -  Copy of [place] in Chart? - - - No - copy requested - - No - copy requested  Would patient like information on creating a medical advance directive? No - Patient declined No - Patient declined No - Patient declined No - Patient declined - - No - Patient declined    Current Medications (verified) Outpatient Encounter Medications as of 05/08/2020  Medication Sig  . cholecalciferol (VITAMIN D3) 25 MCG (1000 UNIT) tablet Take 1,000 Units by mouth daily.  . clopidogrel (PLAVIX) 75 MG tablet Take 1 tablet (75 mg total) by mouth daily.  . DENTA 5000 PLUS 1.1 % CREA dental cream Place 1 g onto teeth daily.  Marland Kitchen ELIQUIS 5 MG TABS tablet TAKE 1 TABLET BY MOUTH EVERY 12 HOURS  . isosorbide mononitrate (IMDUR) 30 MG 24 hr tablet Take 1 tablet (30 mg total) by mouth daily.  Marland Kitchen levETIRAcetam (KEPPRA) 500 MG tablet Take 1 tablet (500 mg total) by mouth 2 (two) times daily. (Patient taking differently: Take 250 mg by mouth 2 (two) times daily. )  . lisinopril (ZESTRIL) 20 MG tablet Take 1 tablet (20 mg total) by mouth daily.  . metoprolol tartrate (LOPRESSOR) 25 MG tablet Take 1 tablet  (25 mg total) by mouth 2 (two) times daily.  . Multiple Vitamin (MULTIVITAMIN) capsule Take by mouth.  . nitroGLYCERIN (NITROSTAT) 0.4 MG SL tablet Place 1 tablet (0.4 mg total) under the tongue every 5 (five) minutes x 3 doses as needed for chest pain.  Marland Kitchen nystatin (MYCOSTATIN) 100000 UNIT/ML suspension 65mls - swish and spit tid  . omeprazole (PRILOSEC) 20 MG capsule Take 1 capsule (20 mg total) by mouth 2 (two) times daily. (Patient taking differently: Take 20 mg by mouth daily. )  . rosuvastatin (CRESTOR) 40 MG tablet Take 1 tablet (40 mg total) by mouth daily. (Patient not taking: Reported on 05/08/2020)   No facility-administered encounter medications on file as of 05/08/2020.    Allergies (verified) Patient has no known allergies.   History: Past Medical History:  Diagnosis Date  . Basal cell carcinoma (BCC) of back   . Basal cell carcinoma (BCC) of nasal sidewall   . Dizziness   . GERD (gastroesophageal reflux disease)   . H/O blood clots   . Hypercholesterolemia   . Hypertension    hx of HBP readings  . Metastatic squamous cell carcinoma ([employer]) 2018   bx cervical lymph node  . Vascular abnormality   . VTE (venous thromboembolism) 2018   associated with chemotherapy   Past Surgical History:  Procedure Laterality Date  . COLONOSCOPY    .  ESOPHAGOGASTRODUODENOSCOPY (EGD) WITH PROPOFOL N/A 05/24/2015   Procedure: ESOPHAGOGASTRODUODENOSCOPY (EGD) WITH PROPOFOL;  Surgeon: Manya Silvas, MD;  Location: [facility] ENDOSCOPY;  Service: Endoscopy;  Laterality: N/A;  . IR IMAGING GUIDED PORT INSERTION  12/09/2016  . LEFT HEART CATH AND CORONARY ANGIOGRAPHY N/A 12/25/2019   Procedure: LEFT HEART CATH AND CORONARY ANGIOGRAPHY;  Surgeon: Corey Skains, MD;  Location: [facility];  Service: Cardiovascular;  Laterality: N/A;  . SAVORY DILATION N/A 05/24/2015   Procedure: SAVORY DILATION;  Surgeon: Manya Silvas, MD;  Location: [facility] ENDOSCOPY;  Service: Endoscopy;  Laterality: N/A;    Family History  Problem Relation Age of Onset  . Hyperlipidemia Mother   . Heart disease Mother   . Hypertension Mother   . Alzheimer's disease Mother   . Prostate cancer Father   . Stroke Father    Social History   Socioeconomic History  . Marital status: Married    Spouse name: Not on file  . Number of children: Not on file  . Years of education: Not on file  . Highest education level: Not on file  Occupational History  . Occupation: Dealer  Tobacco Use  . Smoking status: Former Smoker    Quit date: 11/11/1996    Years since quitting: 23.5  . Smokeless tobacco: Never Used  Vaping Use  . Vaping Use: Never used  Substance and Sexual Activity  . Alcohol use: No    Alcohol/week: 0.0 standard drinks    Comment: occasional  . Drug use: No  . Sexual activity: Yes  Other Topics Concern  . Not on file  Social History Narrative  . Not on file   Social Determinants of Health   Financial Resource Strain: Low Risk   . Difficulty of Paying Living Expenses: Not hard at all  Food Insecurity: No Food Insecurity  . Worried About Charity fundraiser in the Last Year: Never true  . Ran Out of Food in the Last Year: Never true  Transportation Needs: No Transportation Needs  . Lack of Transportation (Medical): No  . Lack of Transportation (Non-Medical): No  Physical Activity: Insufficiently Active  . Days of Exercise per Week: 4 days  . Minutes of Exercise per Session: 20 min  Stress: No Stress Concern Present  . Feeling of Stress : Not at all  Social Connections: Unknown  . Frequency of Communication with Friends and Family: Not on file  . Frequency of Social Gatherings with Friends and Family: Not on file  . Attends Religious Services: Not on file  . Active Member of Clubs or Organizations: Not on file  . Attends Archivist Meetings: Not on file  . Marital Status: Married    Tobacco Counseling Counseling given: Not Answered   Clinical Intake:  Pre-visit  preparation completed: Yes        Diabetes: No  How often do you need to have someone help you when you read instructions, pamphlets, or other written materials from your doctor or pharmacy?: 1 - Never  Interpreter Needed?: No      Activities of Daily Living In your present state of health, do you have any difficulty performing the following activities: 05/08/2020 12/24/2019  Hearing? N -  Vision? N -  Difficulty concentrating or making decisions? N -  Walking or climbing stairs? Y -  Comment Unsteady gait. Cane in use. -  Dressing or bathing? N -  Doing errands, shopping? Y N  Comment He does not currently drive or  run errands alone at this time. -  Preparing Food and eating ? N -  Using the Toilet? N -  In the past six months, have you accidently leaked urine? N -  Do you have problems with loss of bowel control? N -  Managing your Medications? N -  Managing your Finances? N -  Housekeeping or managing your Housekeeping? N -  Some recent data might be hidden    Patient Care Team: Einar Pheasant, MD as PCP - General (Internal Medicine)  Indicate any recent Medical Services you may have received from other than [facility] providers in the past year (date may be approximate).     Assessment:   This is a routine wellness examination for Ashanti.  I connected with Bradford today by telephone and verified that I am speaking with the correct person using two identifiers. Location patient: home Location provider: work Persons participating in the virtual visit: patient, Marine scientist.    I discussed the limitations, risks, security and privacy concerns of performing an evaluation and management service by telephone and the availability of in person appointments. The patient expressed understanding and verbally consented to this telephonic visit.    Interactive audio and video telecommunications were attempted between this provider and patient, however failed, due to patient having technical  difficulties OR patient did not have access to video capability.  We continued and completed visit with audio only.  Some vital signs may be absent or patient reported.   Hearing/Vision screen  Hearing Screening   125Hz  250Hz  500Hz  1000Hz  2000Hz  3000Hz  4000Hz  6000Hz  8000Hz   Right ear:           Left ear:           Comments: Patient is able to hear conversational tones without difficulty.  No issues reported.  Vision Screening Comments: Followed by Dr. Matilde Sprang Wears reader glasses Visual acuity not assessed, virtual visit.  They have seen their ophthalmologist in the last 12 months.     Dietary issues and exercise activities discussed: Current Exercise Habits: Home exercise routine, Type of exercise: calisthenics, Time (Minutes): 20, Frequency (Times/Week): 4, Weekly Exercise (Minutes/Week): 80, Intensity: Mild  Vegan diet  Good water intake  Goals      Patient Stated   .  Lower cholesterol (pt-stated)      Healthy diet Stay active      Depression Screen PHQ 2/9 Scores 05/08/2020 04/25/2019 12/08/2016 07/02/2016 04/18/2015 09/12/2013  PHQ - 2 Score 0 0 0 0 0 1    Fall Risk Fall Risk  05/08/2020 04/25/2019 03/02/2017 12/08/2016 07/02/2016  Falls in the past year? 0 0 No No No  Number falls in past yr: 0 - - - -  Follow up Falls evaluation completed Falls evaluation completed - - -   Handrails in use when climbing stairs? Yes Home free of loose throw rugs in walkways, pet beds, electrical cords, etc? Yes  Adequate lighting in your home to reduce risk of falls? Yes   ASSISTIVE DEVICES UTILIZED TO PREVENT FALLS:  Life alert? No .  Cell phone on person-? Yes.  Use of a cane, walker or w/c? Yes  Grab bars in the bathroom? Yes  Shower chair or bench in shower? No  Elevated toilet seat or a handicapped toilet? No   TIMED UP AND GO:  Was the test performed? No . Virtual visit.    Cognitive Function:     6CIT Screen 05/08/2020  What Year? 0 points  What month? 0 points  Count  back from 20 0 points  Months in reverse 0 points  Repeat phrase 0 points   Immunizations Immunization History  Administered Date(s) Administered  . Influenza-Unspecified 06/17/2018  . PFIZER SARS-COV-2 Vaccination 04/23/2020    TDAP status: Due, Education has been provided regarding the importance of this vaccine. Advised may receive this vaccine at local pharmacy or Health Dept. Aware to provide a copy of the vaccination record if obtained from local pharmacy or Health Dept. Verbalized acceptance and understanding. Deferred.   Health Maintenance Health Maintenance  Topic Date Due  . Hepatitis C Screening  Never done  . TETANUS/TDAP  Never done  . COVID-19 Vaccine (2 - Pfizer 2-dose series) 05/14/2020  . COLONOSCOPY  06/24/2023  . HIV Screening  Completed  . INFLUENZA VACCINE  Discontinued   Medication- no longer taking Crestor after taking 1 week. Continues to take pravastatin previously prescribed.   Dental Screening: Recommended annual dental exams for proper oral hygiene. Visits every 6 months.   Community Resource Referral / Chronic Care Management: CRR required this visit?  No   CCM required this visit?  No      Plan:   Keep all routine maintenance appointments.   Next scheduled lab 05/14/20 @ 9:45  Follow up 06/04/20 @ 8:00  I have personally reviewed and noted the following in the patient's chart:   . Medical and social history . Use of alcohol, tobacco or illicit drugs  . Current medications and supplements . Functional ability and status . Nutritional status . Physical activity . Advanced directives . List of other physicians . Hospitalizations, surgeries, and ER visits in previous 12 months . Vitals . Screenings to include cognitive, depression, and falls . Referrals and appointments  In addition, I have reviewed and discussed with patient certain preventive protocols, quality metrics, and best practice recommendations. A written personalized care  plan for preventive services as well as general preventive health recommendations were provided to patient via mychart.     Varney Biles, LPN   12/22/2255     Reviewed above information.  Agree with assessment and plan.   Dr Nicki Reaper

## 2020-05-14 ENCOUNTER — Other Ambulatory Visit (INDEPENDENT_AMBULATORY_CARE_PROVIDER_SITE_OTHER): Payer: Medicare Other

## 2020-05-14 ENCOUNTER — Other Ambulatory Visit: Payer: Self-pay

## 2020-05-14 DIAGNOSIS — E7800 Pure hypercholesterolemia, unspecified: Secondary | ICD-10-CM

## 2020-05-14 DIAGNOSIS — Z1159 Encounter for screening for other viral diseases: Secondary | ICD-10-CM | POA: Diagnosis not present

## 2020-05-14 LAB — HEPATIC FUNCTION PANEL
ALT: 28 U/L (ref 0–53)
AST: 25 U/L (ref 0–37)
Albumin: 4.6 g/dL (ref 3.5–5.2)
Alkaline Phosphatase: 56 U/L (ref 39–117)
Bilirubin, Direct: 0.1 mg/dL (ref 0.0–0.3)
Total Bilirubin: 0.5 mg/dL (ref 0.2–1.2)
Total Protein: 7.5 g/dL (ref 6.0–8.3)

## 2020-05-15 ENCOUNTER — Other Ambulatory Visit: Payer: Medicare Other

## 2020-05-15 LAB — HEPATITIS C ANTIBODY
Hepatitis C Ab: NONREACTIVE
SIGNAL TO CUT-OFF: 0.01 (ref <1.00)

## 2020-06-04 ENCOUNTER — Ambulatory Visit: Payer: Medicare Other | Admitting: Internal Medicine

## 2020-06-24 ENCOUNTER — Telehealth (INDEPENDENT_AMBULATORY_CARE_PROVIDER_SITE_OTHER): Payer: Medicare Other | Admitting: Internal Medicine

## 2020-06-24 ENCOUNTER — Other Ambulatory Visit: Payer: Self-pay

## 2020-06-24 ENCOUNTER — Encounter: Payer: Self-pay | Admitting: Internal Medicine

## 2020-06-24 DIAGNOSIS — I70219 Atherosclerosis of native arteries of extremities with intermittent claudication, unspecified extremity: Secondary | ICD-10-CM

## 2020-06-24 DIAGNOSIS — E7800 Pure hypercholesterolemia, unspecified: Secondary | ICD-10-CM

## 2020-06-24 DIAGNOSIS — I252 Old myocardial infarction: Secondary | ICD-10-CM

## 2020-06-24 DIAGNOSIS — I10 Essential (primary) hypertension: Secondary | ICD-10-CM | POA: Diagnosis not present

## 2020-06-24 DIAGNOSIS — K219 Gastro-esophageal reflux disease without esophagitis: Secondary | ICD-10-CM

## 2020-06-24 DIAGNOSIS — R1310 Dysphagia, unspecified: Secondary | ICD-10-CM

## 2020-06-24 DIAGNOSIS — Z85819 Personal history of malignant neoplasm of unspecified site of lip, oral cavity, and pharynx: Secondary | ICD-10-CM

## 2020-06-24 DIAGNOSIS — D649 Anemia, unspecified: Secondary | ICD-10-CM | POA: Diagnosis not present

## 2020-06-24 DIAGNOSIS — G40909 Epilepsy, unspecified, not intractable, without status epilepticus: Secondary | ICD-10-CM

## 2020-06-24 DIAGNOSIS — K1370 Unspecified lesions of oral mucosa: Secondary | ICD-10-CM

## 2020-06-24 NOTE — Progress Notes (Signed)
[name], male   DOB: 1961-04-29, 59 y.o.   MRN: [id number]   Virtual Visit via video Note  This visit type was conducted due to national recommendations for restrictions regarding the COVID-19 pandemic (e.g. social distancing).  This format is felt to be most appropriate for this patient at this time.  All issues noted in this document were discussed and addressed.  No physical exam was performed (except for noted visual exam findings with Video Visits).   I connected with Timothy Garrison by a video enabled telemedicine application and verified that I am speaking with the correct person using two identifiers. Location patient: home Location provider: work Persons participating in the virtual visit: patient, provider  The limitations, risks, security and privacy concerns of performing an evaluation and management service by video and the availability of in person appointments have been discussed.  It ha also been discussed with the patient that there may be a patient responsible charge related to this service. The patient expressed understanding and agreed to proceed.   Reason for visit: scheduled follow up.    HPI: Has seizure disorder.  Followed by neurology.  On lamictal now.  Tolerating.  Feels doing better and tolerating this medication better.  Off keppra. Tries to stay active.  No chest pain or sob reported.  No abdominal pain or bowel change reported.  Recently placed on eliquis.  Off plavix.  Has noticed raised swollen area under his tongue.  Red.  Not as bad today.  Previously has required stretching of esophagus.  Has noticed recently - not swallowing as well.  Has to drink more to wash food down.  No acid reflux.  Feels omeprazole is working well.  Is followed by oncology and Dr Tami Ribas.  Blood pressure doing well - 130s/80s.  Eating well.     ROS: See pertinent positives and negatives per HPI.  Past Medical History:  Diagnosis Date  . Basal cell carcinoma (BCC)  of back   . Basal cell carcinoma (BCC) of nasal sidewall   . Dizziness   . GERD (gastroesophageal reflux disease)   . H/O blood clots   . Hypercholesterolemia   . Hypertension    hx of HBP readings  . Metastatic squamous cell carcinoma ([place]) 2018   bx cervical lymph node  . Vascular abnormality   . VTE (venous thromboembolism) 2018   associated with chemotherapy    Past Surgical History:  Procedure Laterality Date  . COLONOSCOPY    . ESOPHAGOGASTRODUODENOSCOPY (EGD) WITH PROPOFOL N/A 05/24/2015   Procedure: ESOPHAGOGASTRODUODENOSCOPY (EGD) WITH PROPOFOL;  Surgeon: Manya Silvas, MD;  Location: [facility] ENDOSCOPY;  Service: Endoscopy;  Laterality: N/A;  . IR IMAGING GUIDED PORT INSERTION  12/09/2016  . LEFT HEART CATH AND CORONARY ANGIOGRAPHY N/A 12/25/2019   Procedure: LEFT HEART CATH AND CORONARY ANGIOGRAPHY;  Surgeon: Corey Skains, MD;  Location: [facility] LAB;  Service: Cardiovascular;  Laterality: N/A;  . SAVORY DILATION N/A 05/24/2015   Procedure: SAVORY DILATION;  Surgeon: Manya Silvas, MD;  Location: [facility] ENDOSCOPY;  Service: Endoscopy;  Laterality: N/A;    Family History  Problem Relation Age of Onset  . Hyperlipidemia Mother   . Heart disease Mother   . Hypertension Mother   . Alzheimer's disease Mother   . Prostate cancer Father   . Stroke Father     SOCIAL HX: reviewed.    Current Outpatient Medications:  .  cholecalciferol (VITAMIN D3) 25 MCG (1000 UNIT) tablet, Take 1,000  Units by mouth daily., Disp: , Rfl:  .  DENTA 5000 PLUS 1.1 % CREA dental cream, Place 1 g onto teeth daily., Disp: , Rfl:  .  ELIQUIS 5 MG TABS tablet, TAKE 1 TABLET BY MOUTH EVERY 12 HOURS, Disp: 180 tablet, Rfl: 1 .  isosorbide mononitrate (IMDUR) 30 MG 24 hr tablet, Take 1 tablet (30 mg total) by mouth daily., Disp: 30 tablet, Rfl: 1 .  lamoTRIgine (LAMICTAL) 100 MG tablet, Take 100 mg by mouth 2 (two) times daily., Disp: , Rfl:  .  lisinopril (ZESTRIL) 20 MG tablet, Take 1  tablet (20 mg total) by mouth daily., Disp: 30 tablet, Rfl: 11 .  metoprolol tartrate (LOPRESSOR) 25 MG tablet, Take 1 tablet (25 mg total) by mouth 2 (two) times daily., Disp: 60 tablet, Rfl: 1 .  Multiple Vitamin (MULTIVITAMIN) capsule, Take by mouth., Disp: , Rfl:  .  nitroGLYCERIN (NITROSTAT) 0.4 MG SL tablet, Place 1 tablet (0.4 mg total) under the tongue every 5 (five) minutes x 3 doses as needed for chest pain., Disp: 30 tablet, Rfl: 12 .  nystatin (MYCOSTATIN) 100000 UNIT/ML suspension, 33mls - swish and spit tid, Disp: 60 mL, Rfl: 0 .  omeprazole (PRILOSEC) 20 MG capsule, Take 1 capsule (20 mg total) by mouth 2 (two) times daily. (Patient taking differently: Take 20 mg by mouth daily. ), Disp: 180 capsule, Rfl: 2 .  pravastatin (PRAVACHOL) 40 MG tablet, Take 40 mg by mouth at bedtime., Disp: , Rfl:   EXAM:  VITALS per patient if applicable: [id number]  GENERAL: alert, oriented, appears well and in no acute distress  HEENT: atraumatic, conjunttiva clear, no obvious abnormalities on inspection of external nose and ears  NECK: normal movements of the head and neck  LUNGS: on inspection no signs of respiratory distress, breathing rate appears normal, no obvious gross SOB, gasping or wheezing  CV: no obvious cyanosis  PSYCH/NEURO: pleasant and cooperative, no obvious depression or anxiety, speech and thought processing grossly intact  ASSESSMENT AND PLAN:  Discussed the following assessment and plan:  Problem List Items Addressed This Visit    Seizure disorder ([place])    On lamictal now. No episodes since starting lamictal.  Tolerating.       Relevant Medications   lamoTRIgine (LAMICTAL) 100 MG tablet   Mouth lesion    Improved.  Continue to monitor. Call with update.       Hypercholesterolemia    Low cholesterol diet and exercise.  On pravastatin.  Follow lipid panel and liver function tests.  Previous intolerance to lipitor and crestor.        Relevant Medications    pravastatin (PRAVACHOL) 40 MG tablet   History of non-ST elevation myocardial infarction (NSTEMI)    Recently admitted with nonSTEMI.  Three vessel disease on cath.  Elected Conservation officer, nature.  Continue metoprolol.  On eliquis.  Continue pravastatin.        History of malignant neoplasm of oropharynx    S/p chemo and XRT.  Followed by oncology.       GERD (gastroesophageal reflux disease)    Acid reflux controlled on prilosec.        Essential hypertension, benign    Blood pressure as outlined. On lisinopril.  Follow pressures.  Follow metabolic panel.       Relevant Medications   pravastatin (PRAVACHOL) 40 MG tablet   Dysphagia    Has previously required esophageal stretching.  Some swallowing issues as outlined.  Reflux controlled with prilosec.  Discussed  small bites, chewing food well, etc.  Follow.  Continue f/u with oncology.       Atherosclerotic PVD with intermittent claudication (HCC)    S/p fem fem bypass.  Followed by [facility].  On eliquis.  Continue pravastatin.        Relevant Medications   lamoTRIgine (LAMICTAL) 100 MG tablet   pravastatin (PRAVACHOL) 40 MG tablet   Anemia    Follow cbc.           I discussed the assessment and treatment plan with the patient. The patient was provided an opportunity to ask questions and all were answered. The patient agreed with the plan and demonstrated an understanding of the instructions.   The patient was advised to call back or seek an in-person evaluation if the symptoms worsen or if the condition fails to improve as anticipated.    Einar Pheasant, MD

## 2020-06-24 NOTE — Telephone Encounter (Signed)
Discussed with pts wife.  Reported that the lesion under his tongue has improved.  Will continue to follow and message or call with update in the next 1-2 days.

## 2020-06-29 ENCOUNTER — Encounter: Payer: Self-pay | Admitting: Internal Medicine

## 2020-06-29 DIAGNOSIS — K1370 Unspecified lesions of oral mucosa: Secondary | ICD-10-CM | POA: Insufficient documentation

## 2020-06-29 NOTE — Assessment & Plan Note (Signed)
On lamictal now. No episodes since starting lamictal.  Tolerating.

## 2020-06-29 NOTE — Assessment & Plan Note (Addendum)
Improved.  Continue to monitor. Call with update.

## 2020-06-29 NOTE — Assessment & Plan Note (Addendum)
Low cholesterol diet and exercise.  On pravastatin.  Follow lipid panel and liver function tests.  Previous intolerance to lipitor and crestor.

## 2020-06-29 NOTE — Assessment & Plan Note (Signed)
Has previously required esophageal stretching.  Some swallowing issues as outlined.  Reflux controlled with prilosec.  Discussed small bites, chewing food well, etc.  Follow.  Continue f/u with oncology.

## 2020-06-29 NOTE — Assessment & Plan Note (Signed)
Recently admitted with nonSTEMI.  Three vessel disease on cath.  Elected Conservation officer, nature.  Continue metoprolol.  On eliquis.  Continue pravastatin.

## 2020-06-29 NOTE — Assessment & Plan Note (Signed)
Blood pressure as outlined.  On lisinopril.  Follow pressures.  Follow metabolic panel.  

## 2020-06-29 NOTE — Assessment & Plan Note (Signed)
Follow cbc.  

## 2020-06-29 NOTE — Assessment & Plan Note (Signed)
S/p fem fem bypass.  Followed by [facility].  On eliquis.  Continue pravastatin.

## 2020-06-29 NOTE — Assessment & Plan Note (Signed)
S/p chemo and XRT.  Followed by oncology.

## 2020-06-29 NOTE — Assessment & Plan Note (Signed)
Acid reflux controlled on prilosec.

## 2020-07-22 DIAGNOSIS — Z85818 Personal history of malignant neoplasm of other sites of lip, oral cavity, and pharynx: Secondary | ICD-10-CM | POA: Diagnosis not present

## 2020-07-22 DIAGNOSIS — R221 Localized swelling, mass and lump, neck: Secondary | ICD-10-CM | POA: Diagnosis not present

## 2020-08-08 ENCOUNTER — Ambulatory Visit: Payer: Medicare Other | Admitting: Internal Medicine

## 2020-08-12 ENCOUNTER — Other Ambulatory Visit: Payer: Self-pay | Admitting: Internal Medicine

## 2020-08-26 ENCOUNTER — Other Ambulatory Visit: Payer: Self-pay | Admitting: Internal Medicine

## 2020-08-27 DIAGNOSIS — L720 Epidermal cyst: Secondary | ICD-10-CM | POA: Diagnosis not present

## 2020-08-27 DIAGNOSIS — L905 Scar conditions and fibrosis of skin: Secondary | ICD-10-CM | POA: Diagnosis not present

## 2020-08-27 DIAGNOSIS — L821 Other seborrheic keratosis: Secondary | ICD-10-CM | POA: Diagnosis not present

## 2020-08-27 DIAGNOSIS — L570 Actinic keratosis: Secondary | ICD-10-CM | POA: Diagnosis not present

## 2020-08-27 DIAGNOSIS — D2239 Melanocytic nevi of other parts of face: Secondary | ICD-10-CM | POA: Diagnosis not present

## 2020-08-27 DIAGNOSIS — L578 Other skin changes due to chronic exposure to nonionizing radiation: Secondary | ICD-10-CM | POA: Diagnosis not present

## 2020-08-27 DIAGNOSIS — D485 Neoplasm of uncertain behavior of skin: Secondary | ICD-10-CM | POA: Diagnosis not present

## 2020-08-28 ENCOUNTER — Other Ambulatory Visit: Payer: Self-pay

## 2020-08-28 ENCOUNTER — Ambulatory Visit (INDEPENDENT_AMBULATORY_CARE_PROVIDER_SITE_OTHER): Payer: Medicare Other | Admitting: Internal Medicine

## 2020-08-28 VITALS — BP 136/70 | HR 85 | Temp 97.6°F | Resp 16 | Ht 67.0 in | Wt 157.8 lb

## 2020-08-28 DIAGNOSIS — I70219 Atherosclerosis of native arteries of extremities with intermittent claudication, unspecified extremity: Secondary | ICD-10-CM

## 2020-08-28 DIAGNOSIS — K219 Gastro-esophageal reflux disease without esophagitis: Secondary | ICD-10-CM | POA: Diagnosis not present

## 2020-08-28 DIAGNOSIS — E7800 Pure hypercholesterolemia, unspecified: Secondary | ICD-10-CM

## 2020-08-28 DIAGNOSIS — Z85819 Personal history of malignant neoplasm of unspecified site of lip, oral cavity, and pharynx: Secondary | ICD-10-CM | POA: Diagnosis not present

## 2020-08-28 DIAGNOSIS — G40909 Epilepsy, unspecified, not intractable, without status epilepticus: Secondary | ICD-10-CM

## 2020-08-28 DIAGNOSIS — D649 Anemia, unspecified: Secondary | ICD-10-CM | POA: Diagnosis not present

## 2020-08-28 DIAGNOSIS — I10 Essential (primary) hypertension: Secondary | ICD-10-CM

## 2020-08-28 LAB — LIPID PANEL
Cholesterol: 270 mg/dL — ABNORMAL HIGH (ref 0–200)
HDL: 49.3 mg/dL (ref >39.00)
LDL Cholesterol: 184 mg/dL — ABNORMAL HIGH (ref 0–99)
NonHDL: 220.54
Total CHOL/HDL Ratio: 5
Triglycerides: 184 mg/dL — ABNORMAL HIGH (ref 0.0–149.0)
VLDL: 36.8 mg/dL (ref 0.0–40.0)

## 2020-08-28 LAB — BASIC METABOLIC PANEL
BUN: 18 mg/dL (ref 6–23)
CO2: 28 mEq/L (ref 19–32)
Calcium: 10.1 mg/dL (ref 8.4–10.5)
Chloride: 103 mEq/L (ref 96–112)
Creatinine, Ser: 1.07 mg/dL (ref 0.40–1.50)
GFR: 75.61 mL/min (ref >60.00)
Glucose, Bld: 84 mg/dL (ref 70–99)
Potassium: 4.3 mEq/L (ref 3.5–5.1)
Sodium: 138 mEq/L (ref 135–145)

## 2020-08-28 LAB — HEPATIC FUNCTION PANEL
ALT: 21 U/L (ref 0–53)
AST: 23 U/L (ref 0–37)
Albumin: 4.8 g/dL (ref 3.5–5.2)
Alkaline Phosphatase: 66 U/L (ref 39–117)
Bilirubin, Direct: 0.1 mg/dL (ref 0.0–0.3)
Total Bilirubin: 0.4 mg/dL (ref 0.2–1.2)
Total Protein: 7.6 g/dL (ref 6.0–8.3)

## 2020-08-28 MED ORDER — LISINOPRIL 30 MG PO TABS: 30.0000 mg | ORAL_TABLET | Freq: Every day | ORAL | 3 refills | Status: DC

## 2020-08-28 NOTE — Progress Notes (Signed)
[name], male   DOB: 11-23-1960, 60 y.o.   MRN: [id number]   Subjective:    [name], male    DOB: 15-Aug-1961, 60 y.o.   MRN: [id number]  HPI This visit occurred during the SARS-CoV-2 public health emergency.  Safety protocols were in place, including screening questions prior to the visit, additional usage of staff PPE, and extensive cleaning of exam room while observing appropriate contact time as indicated for disinfecting solutions.  Patient here for a scheduled follow up.  Here to follow up regarding his cholesterol and blood pressures.  Also history of seizures.  Seeing neurology.  Tapered off keppra and now on lamictal.  Had a brief episode when he was tapering the keppra and starting lamictal - of eyes blinking.  Only lasted about 10-15 minutes.  No other seizures recently.  Has noticed some issues with delayed recall.  No headache.  No dizziness.  Tries to stay active.  No chest pain or sob.  No acid reflux or abdominal pain reported.  Bowels moving.  Does not feel tolerates higher dose of cholesterol medication.     Past Medical History:  Diagnosis Date  . Basal cell carcinoma (BCC) of back   . Basal cell carcinoma (BCC) of nasal sidewall   . Dizziness   . GERD (gastroesophageal reflux disease)   . H/O blood clots   . Hypercholesterolemia   . Hypertension    hx of HBP readings  . Metastatic squamous cell carcinoma ([place]) 2018   bx cervical lymph node  . Vascular abnormality   . VTE (venous thromboembolism) 2018   associated with chemotherapy   Past Surgical History:  Procedure Laterality Date  . COLONOSCOPY    . ESOPHAGOGASTRODUODENOSCOPY (EGD) WITH PROPOFOL N/A 05/24/2015   Procedure: ESOPHAGOGASTRODUODENOSCOPY (EGD) WITH PROPOFOL;  Surgeon: Manya Silvas, MD;  Location: [facility] ENDOSCOPY;  Service: Endoscopy;  Laterality: N/A;  . IR IMAGING GUIDED PORT INSERTION  12/09/2016  . LEFT HEART CATH AND CORONARY ANGIOGRAPHY N/A 12/25/2019    Procedure: LEFT HEART CATH AND CORONARY ANGIOGRAPHY;  Surgeon: Corey Skains, MD;  Location: [place] [facility];  Service: Cardiovascular;  Laterality: N/A;  . SAVORY DILATION N/A 05/24/2015   Procedure: SAVORY DILATION;  Surgeon: Manya Silvas, MD;  Location: [facility] ENDOSCOPY;  Service: Endoscopy;  Laterality: N/A;   Family History  Problem Relation Age of Onset  . Hyperlipidemia Mother   . Heart disease Mother   . Hypertension Mother   . Alzheimer's disease Mother   . Prostate cancer Father   . Stroke Father    Social History   Socioeconomic History  . Marital status: Married    Spouse name: Not on file  . Number of children: Not on file  . Years of education: Not on file  . Highest education level: Not on file  Occupational History  . Occupation: Dealer  Tobacco Use  . Smoking status: Former Smoker    Quit date: 11/11/1996    Years since quitting: 23.8  . Smokeless tobacco: Never Used  Vaping Use  . Vaping Use: Never used  Substance and Sexual Activity  . Alcohol use: No    Alcohol/week: 0.0 standard drinks    Comment: occasional  . Drug use: No  . Sexual activity: Yes  Other Topics Concern  . Not on file  Social History Narrative  . Not on file   Social Determinants of Health   Financial Resource Strain: Low Risk   .  Difficulty of Paying Living Expenses: Not hard at all  Food Insecurity: No Food Insecurity  . Worried About Charity fundraiser in the Last Year: Never true  . Ran Out of Food in the Last Year: Never true  Transportation Needs: No Transportation Needs  . Lack of Transportation (Medical): No  . Lack of Transportation (Non-Medical): No  Physical Activity: Insufficiently Active  . Days of Exercise per Week: 4 days  . Minutes of Exercise per Session: 20 min  Stress: No Stress Concern Present  . Feeling of Stress : Not at all  Social Connections: Unknown  . Frequency of Communication with Friends and Family: Not on file  . Frequency of  Social Gatherings with Friends and Family: Not on file  . Attends Religious Services: Not on file  . Active Member of Clubs or Organizations: Not on file  . Attends Archivist Meetings: Not on file  . Marital Status: Married    Outpatient Encounter Medications as of 08/28/2020  Medication Sig  . lisinopril (ZESTRIL) 30 MG tablet Take 1 tablet (30 mg total) by mouth daily.  . cholecalciferol (VITAMIN D3) 25 MCG (1000 UNIT) tablet Take 1,000 Units by mouth daily.  . DENTA 5000 PLUS 1.1 % CREA dental cream Place 1 g onto teeth daily.  Marland Kitchen ELIQUIS 5 MG TABS tablet TAKE 1 TABLET BY MOUTH EVERY 12 HOURS  . lamoTRIgine (LAMICTAL) 100 MG tablet Take 100 mg by mouth 2 (two) times daily.  . Multiple Vitamin (MULTIVITAMIN) capsule Take by mouth.  . nitroGLYCERIN (NITROSTAT) 0.4 MG SL tablet Place 1 tablet (0.4 mg total) under the tongue every 5 (five) minutes x 3 doses as needed for chest pain.  Marland Kitchen omeprazole (PRILOSEC) 20 MG capsule Take 1 capsule (20 mg total) by mouth 2 (two) times daily. (Patient taking differently: Take 20 mg by mouth daily. )  . pravastatin (PRAVACHOL) 40 MG tablet TAKE 1 TABLET BY MOUTH EVERY DAY  . [DISCONTINUED] isosorbide mononitrate (IMDUR) 30 MG 24 hr tablet Take 1 tablet (30 mg total) by mouth daily.  . [DISCONTINUED] lisinopril (ZESTRIL) 20 MG tablet Take 1 tablet (20 mg total) by mouth daily.  . [DISCONTINUED] metoprolol tartrate (LOPRESSOR) 25 MG tablet Take 1 tablet (25 mg total) by mouth 2 (two) times daily.  . [DISCONTINUED] nystatin (MYCOSTATIN) 100000 UNIT/ML suspension 16mls - swish and spit tid   No facility-administered encounter medications on file as of 08/28/2020.    Review of Systems  Constitutional: Negative for appetite change and unexpected weight change.  HENT: Negative for congestion and sinus pressure.   Respiratory: Negative for cough, chest tightness and shortness of breath.   Cardiovascular: Negative for chest pain, palpitations and leg  swelling.  Gastrointestinal: Negative for abdominal pain, diarrhea, nausea and vomiting.  Genitourinary: Negative for difficulty urinating and dysuria.  Musculoskeletal: Negative for joint swelling and myalgias.  Skin: Negative for color change and rash.  Neurological: Negative for dizziness, light-headedness and headaches.  Psychiatric/Behavioral: Negative for agitation and dysphoric mood.       Objective:    Physical Exam Vitals reviewed.  Constitutional:      General: He is not in acute distress.    Appearance: Normal appearance. He is well-developed and well-nourished.  HENT:     Head: Normocephalic and atraumatic.     Right Ear: External ear normal.     Left Ear: External ear normal.  Eyes:     General: No scleral icterus.       Right  eye: No discharge.        Left eye: No discharge.     Conjunctiva/sclera: Conjunctivae normal.  Cardiovascular:     Rate and Rhythm: Normal rate and regular rhythm.  Pulmonary:     Effort: Pulmonary effort is normal. No respiratory distress.     Breath sounds: Normal breath sounds.  Abdominal:     General: Bowel sounds are normal.     Palpations: Abdomen is soft.     Tenderness: There is no abdominal tenderness.  Musculoskeletal:        General: No swelling, tenderness or edema.     Cervical back: Neck supple. No tenderness.  Lymphadenopathy:     Cervical: No cervical adenopathy.  Skin:    Findings: No erythema or rash.  Neurological:     Mental Status: He is alert.  Psychiatric:        Mood and Affect: Mood and affect and mood normal.        Behavior: Behavior normal.     BP 136/70   Pulse 85   Temp 97.6 F (36.4 C) (Oral)   Resp 16   Ht 5\' 7"  (1.702 m)   Wt 157 lb 12.8 oz (71.6 kg)   SpO2 98%   BMI 24.71 kg/m  Wt Readings from Last 3 Encounters:  08/28/20 157 lb 12.8 oz (71.6 kg)  05/08/20 153 lb (69.4 kg)  04/04/20 153 lb (69.4 kg)     Lab Results  Component Value Date   WBC 6.3 04/02/2020   HGB 13.4  04/02/2020   HCT 40.3 04/02/2020   PLT 329.0 04/02/2020   GLUCOSE 84 08/28/2020   CHOL 270 (H) 08/28/2020   TRIG 184.0 (H) 08/28/2020   HDL 49.30 08/28/2020   LDLDIRECT 150.0 04/02/2020   LDLCALC 184 (H) 08/28/2020   ALT 21 08/28/2020   AST 23 08/28/2020   NA 138 08/28/2020   K 4.3 08/28/2020   CL 103 08/28/2020   CREATININE 1.07 08/28/2020   BUN 18 08/28/2020   CO2 28 08/28/2020   TSH 1.99 04/02/2020   PSA 0.94 04/02/2020   INR 1.1 12/23/2019   HGBA1C 6.0 (H) 05/23/2019    CT HEAD WO CONTRAST  Result Date: 12/23/2019 CLINICAL DATA:  Seizure, nausea, amnesia EXAM: CT HEAD WITHOUT CONTRAST TECHNIQUE: Contiguous axial images were obtained from the base of the skull through the vertex without intravenous contrast. COMPARISON:  05/23/2019 FINDINGS: Brain: Stable encephalomalacia right temporal lobe compatible with prior infarct. No acute infarct or hemorrhage. Lateral ventricles and midline structures are unremarkable. No acute extra-axial fluid collections. No mass effect. Vascular: No hyperdense vessel or unexpected calcification. Skull: Normal. Negative for fracture or focal lesion. Sinuses/Orbits: Mucosal thickening within the left posterior ethmoid air cells. Remaining sinuses are clear. Other: None. IMPRESSION: 1. Stable chronic ischemic change.  No acute intracranial process. Electronically Signed   By: Randa Ngo M.D.   On: 12/23/2019 20:39   DG Chest Portable 1 View  Result Date: 12/23/2019 CLINICAL DATA:  Seizure, weakness EXAM: PORTABLE CHEST 1 VIEW COMPARISON:  05/23/2019 FINDINGS: The heart size and mediastinal contours are within normal limits. Both lungs are clear. The visualized skeletal structures are unremarkable. IMPRESSION: No active disease. Electronically Signed   By: Randa Ngo M.D.   On: 12/23/2019 20:39   CT ANGIO CHEST AORTA W/CM & OR WO/CM  Result Date: 12/23/2019 CLINICAL DATA:  60 year old male with remote history of or pharyngeal cancer. Aortic Disease.  No trauma. EXAM: CT ANGIOGRAPHY CHEST WITH CONTRAST  TECHNIQUE: Multidetector CT imaging of the chest was performed using the standard protocol during bolus administration of intravenous contrast. Multiplanar CT image reconstructions and MIPs were obtained to evaluate the vascular anatomy. CONTRAST:  18mL OMNIPAQUE IOHEXOL 350 MG/ML SOLN COMPARISON:  Chest radiograph dated 12/23/2019. FINDINGS: Cardiovascular: There is no cardiomegaly or pericardial effusion. Moderate atherosclerotic calcification of the aorta. No aneurysmal dilatation or dissection. The origins of the great vessels of the aortic arch appear patent as visualized. No pulmonary artery embolus identified. Mediastinum/Nodes: There is no hilar or mediastinal adenopathy. The esophagus and the thyroid gland are grossly unremarkable. No mediastinal fluid collection. Lungs/Pleura: Minimal bibasilar dependent atelectasis. No focal consolidation, pleural effusion, pneumothorax. The central airways are patent. Upper Abdomen: No acute abnormality. Musculoskeletal: No chest wall abnormality. No acute or significant osseous findings. Review of the MIP images confirms the above findings. IMPRESSION: No acute intrathoracic pathology. No CT evidence of pulmonary embolism. Electronically Signed   By: Anner Crete M.D.   On: 12/23/2019 22:33       Assessment & Plan:   Problem List Items Addressed This Visit    Anemia    Follow cbc.       Atherosclerotic PVD with intermittent claudication (HCC)    S/p fem fem bypass.  Followed by [facility].  On eliquis.  Continue pravastatin.       Relevant Medications   lisinopril (ZESTRIL) 30 MG tablet   Essential hypertension, benign - Primary    Blood pressure as outlined.  Still a little elevated.  He is hesitant to increase medication.  Increase lisinopril to 30mg  q day.  Follow pressures.  Follow metabolic panel.       Relevant Medications   lisinopril (ZESTRIL) 30 MG tablet   Other Relevant Orders   Basic  metabolic panel (Completed)   GERD (gastroesophageal reflux disease)    No acid reflux reported.  On prilosec.       History of malignant neoplasm of oropharynx    S/p chemo and XRT. Followed by oncology.       Hypercholesterolemia    On pravastatin.  Low cholesterol diet and exercise.  Follow lipid panel and liver function tests.        Relevant Medications   lisinopril (ZESTRIL) 30 MG tablet   Other Relevant Orders   Hepatic function panel (Completed)   Lipid panel (Completed)   Seizure disorder ([place])    On lamictal now.  Noticed 10-15 minute episode while tapering keppra and starting lamictal.  No other episodes.  Has f/u with neurology. Plan to discuss with neurology.            Einar Pheasant, MD

## 2020-09-01 ENCOUNTER — Encounter: Payer: Self-pay | Admitting: Internal Medicine

## 2020-09-01 NOTE — Assessment & Plan Note (Signed)
On lamictal now.  Noticed 10-15 minute episode while tapering keppra and starting lamictal.  No other episodes.  Has f/u with neurology. Plan to discuss with neurology.

## 2020-09-01 NOTE — Assessment & Plan Note (Signed)
On pravastatin.  Low cholesterol diet and exercise.  Follow lipid panel and liver function tests.   

## 2020-09-01 NOTE — Assessment & Plan Note (Signed)
S/p chemo and XRT.  Followed by oncology.  

## 2020-09-01 NOTE — Assessment & Plan Note (Signed)
Blood pressure as outlined.  Still a little elevated.  He is hesitant to increase medication.  Increase lisinopril to 30mg  q day.  Follow pressures.  Follow metabolic panel.

## 2020-09-01 NOTE — Assessment & Plan Note (Signed)
Follow cbc.  

## 2020-09-01 NOTE — Assessment & Plan Note (Signed)
S/p fem fem bypass.  Followed by [facility].  On eliquis.  Continue pravastatin.   

## 2020-09-01 NOTE — Assessment & Plan Note (Signed)
No acid reflux reported.  On prilosec.  

## 2020-09-03 DIAGNOSIS — Z79899 Other long term (current) drug therapy: Secondary | ICD-10-CM | POA: Diagnosis not present

## 2020-09-03 DIAGNOSIS — R569 Unspecified convulsions: Secondary | ICD-10-CM | POA: Diagnosis not present

## 2020-09-11 ENCOUNTER — Other Ambulatory Visit: Payer: Self-pay | Admitting: Internal Medicine

## 2020-10-03 ENCOUNTER — Other Ambulatory Visit: Payer: Self-pay | Admitting: Internal Medicine

## 2020-12-02 ENCOUNTER — Encounter: Payer: Medicare Other | Admitting: Internal Medicine

## 2020-12-10 DIAGNOSIS — R0989 Other specified symptoms and signs involving the circulatory and respiratory systems: Secondary | ICD-10-CM | POA: Diagnosis not present

## 2020-12-10 DIAGNOSIS — I70219 Atherosclerosis of native arteries of extremities with intermittent claudication, unspecified extremity: Secondary | ICD-10-CM | POA: Diagnosis not present

## 2020-12-10 DIAGNOSIS — Z95828 Presence of other vascular implants and grafts: Secondary | ICD-10-CM | POA: Diagnosis not present

## 2020-12-12 DIAGNOSIS — R1031 Right lower quadrant pain: Secondary | ICD-10-CM | POA: Diagnosis not present

## 2020-12-18 ENCOUNTER — Ambulatory Visit (INDEPENDENT_AMBULATORY_CARE_PROVIDER_SITE_OTHER): Payer: Medicare Other

## 2020-12-18 ENCOUNTER — Ambulatory Visit (INDEPENDENT_AMBULATORY_CARE_PROVIDER_SITE_OTHER): Payer: Medicare Other | Admitting: Internal Medicine

## 2020-12-18 ENCOUNTER — Other Ambulatory Visit: Payer: Self-pay

## 2020-12-18 VITALS — BP 136/82 | HR 86 | Temp 98.0°F | Resp 16 | Ht 68.0 in | Wt 151.4 lb

## 2020-12-18 DIAGNOSIS — E7800 Pure hypercholesterolemia, unspecified: Secondary | ICD-10-CM | POA: Diagnosis not present

## 2020-12-18 DIAGNOSIS — R1031 Right lower quadrant pain: Secondary | ICD-10-CM

## 2020-12-18 DIAGNOSIS — G40909 Epilepsy, unspecified, not intractable, without status epilepticus: Secondary | ICD-10-CM | POA: Diagnosis not present

## 2020-12-18 DIAGNOSIS — I70219 Atherosclerosis of native arteries of extremities with intermittent claudication, unspecified extremity: Secondary | ICD-10-CM

## 2020-12-18 DIAGNOSIS — K5900 Constipation, unspecified: Secondary | ICD-10-CM | POA: Diagnosis not present

## 2020-12-18 DIAGNOSIS — K219 Gastro-esophageal reflux disease without esophagitis: Secondary | ICD-10-CM | POA: Diagnosis not present

## 2020-12-18 DIAGNOSIS — Z0000 Encounter for general adult medical examination without abnormal findings: Secondary | ICD-10-CM | POA: Diagnosis not present

## 2020-12-18 DIAGNOSIS — I10 Essential (primary) hypertension: Secondary | ICD-10-CM

## 2020-12-18 DIAGNOSIS — D649 Anemia, unspecified: Secondary | ICD-10-CM | POA: Diagnosis not present

## 2020-12-18 DIAGNOSIS — Z85819 Personal history of malignant neoplasm of unspecified site of lip, oral cavity, and pharynx: Secondary | ICD-10-CM | POA: Diagnosis not present

## 2020-12-18 DIAGNOSIS — R109 Unspecified abdominal pain: Secondary | ICD-10-CM | POA: Diagnosis not present

## 2020-12-18 DIAGNOSIS — R1030 Lower abdominal pain, unspecified: Secondary | ICD-10-CM | POA: Diagnosis not present

## 2020-12-18 LAB — LIPID PANEL
Cholesterol: 255 mg/dL — ABNORMAL HIGH (ref 0–200)
HDL: 46.7 mg/dL (ref >39.00)
LDL Cholesterol: 179 mg/dL — ABNORMAL HIGH (ref 0–99)
NonHDL: 208.28
Total CHOL/HDL Ratio: 5
Triglycerides: 145 mg/dL (ref 0.0–149.0)
VLDL: 29 mg/dL (ref 0.0–40.0)

## 2020-12-18 LAB — CBC WITH DIFFERENTIAL/PLATELET
Basophils Absolute: 0.1 10*3/uL (ref 0.0–0.1)
Basophils Relative: 1.2 % (ref 0.0–3.0)
Eosinophils Absolute: 0.1 10*3/uL (ref 0.0–0.7)
Eosinophils Relative: 1.1 % (ref 0.0–5.0)
HCT: 38.6 % — ABNORMAL LOW (ref 39.0–52.0)
Hemoglobin: 12.3 g/dL — ABNORMAL LOW (ref 13.0–17.0)
Lymphocytes Relative: 16.5 % (ref 12.0–46.0)
Lymphs Abs: 1 10*3/uL (ref 0.7–4.0)
MCHC: 31.8 g/dL (ref 30.0–36.0)
MCV: 82.9 fl (ref 78.0–100.0)
Monocytes Absolute: 0.6 10*3/uL (ref 0.1–1.0)
Monocytes Relative: 9 % (ref 3.0–12.0)
Neutro Abs: 4.4 10*3/uL (ref 1.4–7.7)
Neutrophils Relative %: 72.2 % (ref 43.0–77.0)
Platelets: 341 10*3/uL (ref 150.0–400.0)
RBC: 4.66 Mil/uL (ref 4.22–5.81)
RDW: 15.2 % (ref 11.5–15.5)
WBC: 6.1 10*3/uL (ref 4.0–10.5)

## 2020-12-18 LAB — BASIC METABOLIC PANEL
BUN: 15 mg/dL (ref 6–23)
CO2: 26 mEq/L (ref 19–32)
Calcium: 9.9 mg/dL (ref 8.4–10.5)
Chloride: 100 mEq/L (ref 96–112)
Creatinine, Ser: 1.14 mg/dL (ref 0.40–1.50)
GFR: 69.92 mL/min (ref >60.00)
Glucose, Bld: 91 mg/dL (ref 70–99)
Potassium: 4.6 mEq/L (ref 3.5–5.1)
Sodium: 135 mEq/L (ref 135–145)

## 2020-12-18 LAB — HEPATIC FUNCTION PANEL
ALT: 18 U/L (ref 0–53)
AST: 23 U/L (ref 0–37)
Albumin: 4.5 g/dL (ref 3.5–5.2)
Alkaline Phosphatase: 78 U/L (ref 39–117)
Bilirubin, Direct: 0.1 mg/dL (ref 0.0–0.3)
Total Bilirubin: 0.4 mg/dL (ref 0.2–1.2)
Total Protein: 7.4 g/dL (ref 6.0–8.3)

## 2020-12-18 NOTE — Progress Notes (Signed)
[name], male   DOB: 03-31-1961, 60 y.o.   MRN: [id number]   Subjective:    [name], male    DOB: April 10, 1961, 60 y.o.   MRN: [id number]  HPI This visit occurred during the SARS-CoV-2 public health emergency.  Safety protocols were in place, including screening questions prior to the visit, additional usage of staff PPE, and extensive cleaning of exam room while observing appropriate contact time as indicated for disinfecting solutions.  Patient here for a scheduled physical exam.  He reports he is doing relatively well except for abdominal pain.  Present for two weeks.  Right lower quadrant pain - previously noticed increased swelling.  Discussed with Dr Ezzard Flax. Vascular surgery -  Had CT scan - no focal fluid collection, graft is patent.  Right lower extremity ultrasound - no DVT.  States pain and swelling some better.  Still some soreness. Eating does appear to make it worse.  Releasing gas - helps.  No chest pain.  Breathing stable.  No nausea or vomiting.  Bowels moving.  No hematuria.  No blood in stool.    Past Medical History:  Diagnosis Date  . Basal cell carcinoma (BCC) of back   . Basal cell carcinoma (BCC) of nasal sidewall   . Dizziness   . GERD (gastroesophageal reflux disease)   . H/O blood clots   . Hypercholesterolemia   . Hypertension    hx of HBP readings  . Metastatic squamous cell carcinoma ([place]) 2018   bx cervical lymph node  . Vascular abnormality   . VTE (venous thromboembolism) 2018   associated with chemotherapy   Past Surgical History:  Procedure Laterality Date  . COLONOSCOPY    . ESOPHAGOGASTRODUODENOSCOPY (EGD) WITH PROPOFOL N/A 05/24/2015   Procedure: ESOPHAGOGASTRODUODENOSCOPY (EGD) WITH PROPOFOL;  Surgeon: Manya Silvas, MD;  Location: [facility] ENDOSCOPY;  Service: Endoscopy;  Laterality: N/A;  . IR IMAGING GUIDED PORT INSERTION  12/09/2016  . LEFT HEART CATH AND CORONARY ANGIOGRAPHY N/A 12/25/2019   Procedure:  LEFT HEART CATH AND CORONARY ANGIOGRAPHY;  Surgeon: Corey Skains, MD;  Location: [place] [facility] LAB;  Service: Cardiovascular;  Laterality: N/A;  . SAVORY DILATION N/A 05/24/2015   Procedure: SAVORY DILATION;  Surgeon: Manya Silvas, MD;  Location: [facility] ENDOSCOPY;  Service: Endoscopy;  Laterality: N/A;   Family History  Problem Relation Age of Onset  . Hyperlipidemia Mother   . Heart disease Mother   . Hypertension Mother   . Alzheimer's disease Mother   . Prostate cancer Father   . Stroke Father    Social History   Socioeconomic History  . Marital status: Married    Spouse name: Not on file  . Number of children: Not on file  . Years of education: Not on file  . Highest education level: Not on file  Occupational History  . Occupation: Dealer  Tobacco Use  . Smoking status: Former Smoker    Quit date: 11/11/1996    Years since quitting: 24.1  . Smokeless tobacco: Never Used  Vaping Use  . Vaping Use: Never used  Substance and Sexual Activity  . Alcohol use: No    Alcohol/week: 0.0 standard drinks    Comment: occasional  . Drug use: No  . Sexual activity: Yes  Other Topics Concern  . Not on file  Social History Narrative  . Not on file   Social Determinants of Health   Financial Resource Strain: Low Risk   . Difficulty  of Paying Living Expenses: Not hard at all  Food Insecurity: No Food Insecurity  . Worried About Charity fundraiser in the Last Year: Never true  . Ran Out of Food in the Last Year: Never true  Transportation Needs: No Transportation Needs  . Lack of Transportation (Medical): No  . Lack of Transportation (Non-Medical): No  Physical Activity: Insufficiently Active  . Days of Exercise per Week: 4 days  . Minutes of Exercise per Session: 20 min  Stress: No Stress Concern Present  . Feeling of Stress : Not at all  Social Connections: Unknown  . Frequency of Communication with Friends and Family: Not on file  . Frequency of Social  Gatherings with Friends and Family: Not on file  . Attends Religious Services: Not on file  . Active Member of Clubs or Organizations: Not on file  . Attends Archivist Meetings: Not on file  . Marital Status: Married    Outpatient Encounter Medications as of 12/18/2020  Medication Sig  . DENTA 5000 PLUS 1.1 % CREA dental cream Place 1 g onto teeth daily.  Marland Kitchen ELIQUIS 5 MG TABS tablet TAKE 1 TABLET BY MOUTH EVERY 12 HOURS  . lamoTRIgine (LAMICTAL) 100 MG tablet Take 100 mg by mouth 2 (two) times daily.  Marland Kitchen lamoTRIgine (LAMICTAL) 25 MG tablet Take 25 mg by mouth 2 (two) times daily.  Marland Kitchen lisinopril (ZESTRIL) 30 MG tablet TAKE 1 TABLET BY MOUTH DAILY  . Multiple Vitamin (MULTIVITAMIN) capsule Take by mouth.  . nitroGLYCERIN (NITROSTAT) 0.4 MG SL tablet Place 1 tablet (0.4 mg total) under the tongue every 5 (five) minutes x 3 doses as needed for chest pain.  Marland Kitchen omeprazole (PRILOSEC) 20 MG capsule TAKE 1 CAPSULE TWICE DAILY  . pravastatin (PRAVACHOL) 40 MG tablet TAKE 1 TABLET BY MOUTH EVERY DAY  . [DISCONTINUED] cholecalciferol (VITAMIN D3) 25 MCG (1000 UNIT) tablet Take 1,000 Units by mouth daily.   No facility-administered encounter medications on file as of 12/18/2020.   Review of Systems  Constitutional: Negative for appetite change and unexpected weight change.  HENT: Negative for congestion, sinus pressure and sore throat.   Eyes: Negative for pain and visual disturbance.  Respiratory: Negative for cough, chest tightness and shortness of breath.   Cardiovascular: Negative for chest pain, palpitations and leg swelling.  Gastrointestinal: Negative for diarrhea, nausea and vomiting.       Right lower quadrant discomfort.  Tenderness with palpation.   Genitourinary: Negative for difficulty urinating and dysuria.  Musculoskeletal: Negative for back pain and joint swelling.  Skin: Negative for color change and rash.  Neurological: Negative for dizziness, light-headedness and headaches.   Hematological: Negative for adenopathy. Does not bruise/bleed easily.  Psychiatric/Behavioral: Negative for agitation and dysphoric mood.       Objective:    Physical Exam Vitals reviewed.  Constitutional:      General: He is not in acute distress.    Appearance: Normal appearance. He is well-developed.  HENT:     Head: Normocephalic and atraumatic.     Right Ear: External ear normal.     Left Ear: External ear normal.  Eyes:     General: No scleral icterus.       Right eye: No discharge.        Left eye: No discharge.     Conjunctiva/sclera: Conjunctivae normal.  Cardiovascular:     Rate and Rhythm: Normal rate and regular rhythm.  Pulmonary:     Effort: Pulmonary effort is normal.  No respiratory distress.     Breath sounds: Normal breath sounds.  Abdominal:     General: Bowel sounds are normal.     Palpations: Abdomen is soft.     Comments: Increased tenderness to palpation - right inguinal area and right lower abdomen.  Increased fullness - with standing.    Musculoskeletal:        General: No swelling or tenderness.     Cervical back: Neck supple. No tenderness.  Lymphadenopathy:     Cervical: No cervical adenopathy.  Skin:    Findings: No erythema or rash.  Neurological:     Mental Status: He is alert.  Psychiatric:        Mood and Affect: Mood normal.        Behavior: Behavior normal.    BP 136/82   Pulse 86   Temp 98 F (36.7 C) (Oral)   Resp 16   Ht 5\' 8"  (1.727 m)   Wt 151 lb 6.4 oz (68.7 kg)   SpO2 99%   BMI 23.02 kg/m  Wt Readings from Last 3 Encounters:  12/18/20 151 lb 6.4 oz (68.7 kg)  08/28/20 157 lb 12.8 oz (71.6 kg)  05/08/20 153 lb (69.4 kg)     Lab Results  Component Value Date   WBC 6.1 12/18/2020   HGB 12.3 (L) 12/18/2020   HCT 38.6 (L) 12/18/2020   PLT 341.0 12/18/2020   GLUCOSE 91 12/18/2020   CHOL 255 (H) 12/18/2020   TRIG 145.0 12/18/2020   HDL 46.70 12/18/2020   LDLDIRECT 150.0 04/02/2020   LDLCALC 179 (H) 12/18/2020    ALT 18 12/18/2020   AST 23 12/18/2020   NA 135 12/18/2020   K 4.6 12/18/2020   CL 100 12/18/2020   CREATININE 1.14 12/18/2020   BUN 15 12/18/2020   CO2 26 12/18/2020   TSH 1.99 04/02/2020   PSA 0.94 04/02/2020   INR 1.1 12/23/2019   HGBA1C 6.0 (H) 05/23/2019    CT HEAD WO CONTRAST  Result Date: 12/23/2019 CLINICAL DATA:  Seizure, nausea, amnesia EXAM: CT HEAD WITHOUT CONTRAST TECHNIQUE: Contiguous axial images were obtained from the base of the skull through the vertex without intravenous contrast. COMPARISON:  05/23/2019 FINDINGS: Brain: Stable encephalomalacia right temporal lobe compatible with prior infarct. No acute infarct or hemorrhage. Lateral ventricles and midline structures are unremarkable. No acute extra-axial fluid collections. No mass effect. Vascular: No hyperdense vessel or unexpected calcification. Skull: Normal. Negative for fracture or focal lesion. Sinuses/Orbits: Mucosal thickening within the left posterior ethmoid air cells. Remaining sinuses are clear. Other: None. IMPRESSION: 1. Stable chronic ischemic change.  No acute intracranial process. Electronically Signed   By: Randa Ngo M.D.   On: 12/23/2019 20:39   DG Chest Portable 1 View  Result Date: 12/23/2019 CLINICAL DATA:  Seizure, weakness EXAM: PORTABLE CHEST 1 VIEW COMPARISON:  05/23/2019 FINDINGS: The heart size and mediastinal contours are within normal limits. Both lungs are clear. The visualized skeletal structures are unremarkable. IMPRESSION: No active disease. Electronically Signed   By: Randa Ngo M.D.   On: 12/23/2019 20:39   CT ANGIO CHEST AORTA W/CM & OR WO/CM  Result Date: 12/23/2019 CLINICAL DATA:  60 year old male with remote history of or pharyngeal cancer. Aortic Disease. No trauma. EXAM: CT ANGIOGRAPHY CHEST WITH CONTRAST TECHNIQUE: Multidetector CT imaging of the chest was performed using the standard protocol during bolus administration of intravenous contrast. Multiplanar CT image  reconstructions and MIPs were obtained to evaluate the vascular anatomy. CONTRAST:  142mL OMNIPAQUE IOHEXOL 350 MG/ML SOLN COMPARISON:  Chest radiograph dated 12/23/2019. FINDINGS: Cardiovascular: There is no cardiomegaly or pericardial effusion. Moderate atherosclerotic calcification of the aorta. No aneurysmal dilatation or dissection. The origins of the great vessels of the aortic arch appear patent as visualized. No pulmonary artery embolus identified. Mediastinum/Nodes: There is no hilar or mediastinal adenopathy. The esophagus and the thyroid gland are grossly unremarkable. No mediastinal fluid collection. Lungs/Pleura: Minimal bibasilar dependent atelectasis. No focal consolidation, pleural effusion, pneumothorax. The central airways are patent. Upper Abdomen: No acute abnormality. Musculoskeletal: No chest wall abnormality. No acute or significant osseous findings. Review of the MIP images confirms the above findings. IMPRESSION: No acute intrathoracic pathology. No CT evidence of pulmonary embolism. Electronically Signed   By: Anner Crete M.D.   On: 12/23/2019 22:33       Assessment & Plan:   Problem List Items Addressed This Visit    Abdominal pain    Increased pain - lower abdomen - right.  Increased fullness.  Tenderness to palpation.  Recent CT pelvis angiogram - as outlined.  Lower extremity ultrasound negative for DVT.  Question - hernia.  Also discussed keeping bowels moving.  Check KUB.  D/w Dr Ezzard Flax regarding further evaluation.  Refer to GI for further evaluation and question of need for colonoscopy.       Relevant Orders   DG Abd 1 View (Completed)   Anemia    Follow cbc.        Atherosclerotic PVD with intermittent claudication (HCC)    S/p fem fem bypass.  Followed by [facility].  On eliquis.  Continue pravastatin.  Tolerating 80mg  dose.        Relevant Medications   lamoTRIgine (LAMICTAL) 25 MG tablet   Essential hypertension, benign    Continue lisinopril.  Blood  pressure on recheck improved.  Follow pressures.  Follow metabolic panel.       Relevant Orders   CBC with Differential/Platelet (Completed)   Basic metabolic panel (Completed)   GERD (gastroesophageal reflux disease)    No upper symptoms reported.  On prilosec.        Health care maintenance    Physical today 12/17/20.  Colonoscopy 06/2013 - internal hemorrhoids.  PSA 04/02/20 - .94.        History of malignant neoplasm of oropharynx    S/p chemo and XRT.  Followed by oncology.       Hypercholesterolemia    Continue pravastatin.  Low cholesterol diet and exercise.  Follow lipid panel and liver function tests.        Relevant Orders   Hepatic function panel (Completed)   Lipid panel (Completed)   Seizure disorder ([place])    Continues on lamicatal.  No seizures since dose transition.  Follow.       Relevant Medications   lamoTRIgine (LAMICTAL) 25 MG tablet    Other Visit Diagnoses    Routine general medical examination at a health care facility    -  Primary       Einar Pheasant, MD

## 2020-12-22 ENCOUNTER — Encounter: Payer: Self-pay | Admitting: Internal Medicine

## 2020-12-22 NOTE — Assessment & Plan Note (Addendum)
Increased pain - lower abdomen - right.  Increased fullness.  Tenderness to palpation.  Recent CT pelvis angiogram - as outlined.  Lower extremity ultrasound negative for DVT.  Question - hernia.  Also discussed keeping bowels moving.  Check KUB.  D/w Dr Ezzard Flax regarding further evaluation.  Refer to GI for further evaluation and question of need for colonoscopy.

## 2020-12-22 NOTE — Assessment & Plan Note (Signed)
Follow cbc.  

## 2020-12-22 NOTE — Assessment & Plan Note (Signed)
No upper symptoms reported. On prilosec.  

## 2020-12-22 NOTE — Assessment & Plan Note (Signed)
S/p chemo and XRT.  Followed by oncology.  

## 2020-12-22 NOTE — Assessment & Plan Note (Signed)
Continue lisinopril.  Blood pressure on recheck improved.  Follow pressures.  Follow metabolic panel.

## 2020-12-22 NOTE — Assessment & Plan Note (Addendum)
Physical today 12/17/20.  Colonoscopy 06/2013 - internal hemorrhoids.  PSA 04/02/20 - .94.

## 2020-12-22 NOTE — Assessment & Plan Note (Signed)
Continue pravastatin.  Low cholesterol diet and exercise.  Follow lipid panel and liver function tests.   

## 2020-12-22 NOTE — Assessment & Plan Note (Signed)
S/p fem fem bypass.  Followed by [facility].  On eliquis.  Continue pravastatin.  Tolerating 80mg dose.   

## 2020-12-22 NOTE — Assessment & Plan Note (Signed)
Continues on lamicatal.  No seizures since dose transition.  Follow.

## 2020-12-24 ENCOUNTER — Other Ambulatory Visit: Payer: Self-pay | Admitting: Internal Medicine

## 2020-12-24 DIAGNOSIS — D649 Anemia, unspecified: Secondary | ICD-10-CM

## 2020-12-24 NOTE — Progress Notes (Signed)
Order placed for f/u labs.  

## 2020-12-25 ENCOUNTER — Other Ambulatory Visit: Payer: Self-pay | Admitting: Internal Medicine

## 2020-12-25 DIAGNOSIS — R1031 Right lower quadrant pain: Secondary | ICD-10-CM

## 2020-12-25 DIAGNOSIS — K5900 Constipation, unspecified: Secondary | ICD-10-CM

## 2020-12-25 NOTE — Progress Notes (Signed)
Order placed for GI referral.   

## 2021-01-01 ENCOUNTER — Ambulatory Visit: Payer: Medicare Other | Admitting: Dermatology

## 2021-01-06 ENCOUNTER — Ambulatory Visit: Payer: Medicare Other | Admitting: Internal Medicine

## 2021-01-09 ENCOUNTER — Other Ambulatory Visit: Payer: Self-pay

## 2021-01-09 ENCOUNTER — Other Ambulatory Visit (INDEPENDENT_AMBULATORY_CARE_PROVIDER_SITE_OTHER): Payer: Medicare Other

## 2021-01-09 DIAGNOSIS — D649 Anemia, unspecified: Secondary | ICD-10-CM

## 2021-01-09 LAB — CBC WITH DIFFERENTIAL/PLATELET
Basophils Absolute: 0 10*3/uL (ref 0.0–0.1)
Basophils Relative: 0.5 % (ref 0.0–3.0)
Eosinophils Absolute: 0.1 10*3/uL (ref 0.0–0.7)
Eosinophils Relative: 1.4 % (ref 0.0–5.0)
HCT: 34.2 % — ABNORMAL LOW (ref 39.0–52.0)
Hemoglobin: 11.2 g/dL — ABNORMAL LOW (ref 13.0–17.0)
Lymphocytes Relative: 21.9 % (ref 12.0–46.0)
Lymphs Abs: 1.6 10*3/uL (ref 0.7–4.0)
MCHC: 32.7 g/dL (ref 30.0–36.0)
MCV: 81.5 fl (ref 78.0–100.0)
Monocytes Absolute: 0.8 10*3/uL (ref 0.1–1.0)
Monocytes Relative: 10.8 % (ref 3.0–12.0)
Neutro Abs: 4.7 10*3/uL (ref 1.4–7.7)
Neutrophils Relative %: 65.4 % (ref 43.0–77.0)
Platelets: 430 10*3/uL — ABNORMAL HIGH (ref 150.0–400.0)
RBC: 4.2 Mil/uL — ABNORMAL LOW (ref 4.22–5.81)
RDW: 15.2 % (ref 11.5–15.5)
WBC: 7.1 10*3/uL (ref 4.0–10.5)

## 2021-01-09 LAB — FERRITIN: Ferritin: 11.5 ng/mL — ABNORMAL LOW (ref 22.0–322.0)

## 2021-01-09 LAB — VITAMIN B12: Vitamin B-12: 416 pg/mL (ref 211–911)

## 2021-01-10 ENCOUNTER — Other Ambulatory Visit: Payer: Self-pay | Admitting: Internal Medicine

## 2021-01-10 DIAGNOSIS — D509 Iron deficiency anemia, unspecified: Secondary | ICD-10-CM

## 2021-01-10 NOTE — Progress Notes (Signed)
Orders placed for f/u labs.  

## 2021-01-20 DIAGNOSIS — R221 Localized swelling, mass and lump, neck: Secondary | ICD-10-CM | POA: Diagnosis not present

## 2021-01-20 DIAGNOSIS — Z85818 Personal history of malignant neoplasm of other sites of lip, oral cavity, and pharynx: Secondary | ICD-10-CM | POA: Diagnosis not present

## 2021-01-28 ENCOUNTER — Other Ambulatory Visit: Payer: Self-pay | Admitting: Internal Medicine

## 2021-01-29 ENCOUNTER — Other Ambulatory Visit: Payer: Self-pay

## 2021-01-29 ENCOUNTER — Other Ambulatory Visit (INDEPENDENT_AMBULATORY_CARE_PROVIDER_SITE_OTHER): Payer: Medicare Other

## 2021-01-29 DIAGNOSIS — D509 Iron deficiency anemia, unspecified: Secondary | ICD-10-CM | POA: Diagnosis not present

## 2021-01-29 LAB — CBC WITH DIFFERENTIAL/PLATELET
Basophils Absolute: 0 10*3/uL (ref 0.0–0.1)
Basophils Relative: 0.6 % (ref 0.0–3.0)
Eosinophils Absolute: 0.1 10*3/uL (ref 0.0–0.7)
Eosinophils Relative: 1.5 % (ref 0.0–5.0)
HCT: 35.1 % — ABNORMAL LOW (ref 39.0–52.0)
Hemoglobin: 11.6 g/dL — ABNORMAL LOW (ref 13.0–17.0)
Lymphocytes Relative: 25.9 % (ref 12.0–46.0)
Lymphs Abs: 1.6 10*3/uL (ref 0.7–4.0)
MCHC: 32.9 g/dL (ref 30.0–36.0)
MCV: 81.6 fl (ref 78.0–100.0)
Monocytes Absolute: 0.8 10*3/uL (ref 0.1–1.0)
Monocytes Relative: 12.7 % — ABNORMAL HIGH (ref 3.0–12.0)
Neutro Abs: 3.8 10*3/uL (ref 1.4–7.7)
Neutrophils Relative %: 59.3 % (ref 43.0–77.0)
Platelets: 450 10*3/uL — ABNORMAL HIGH (ref 150.0–400.0)
RBC: 4.31 Mil/uL (ref 4.22–5.81)
RDW: 16 % — ABNORMAL HIGH (ref 11.5–15.5)
WBC: 6.4 10*3/uL (ref 4.0–10.5)

## 2021-01-29 LAB — FERRITIN: Ferritin: 16.2 ng/mL — ABNORMAL LOW (ref 22.0–322.0)

## 2021-02-24 ENCOUNTER — Other Ambulatory Visit: Payer: Self-pay

## 2021-02-24 ENCOUNTER — Ambulatory Visit (INDEPENDENT_AMBULATORY_CARE_PROVIDER_SITE_OTHER): Payer: Medicare Other

## 2021-02-24 ENCOUNTER — Encounter: Payer: Self-pay | Admitting: Internal Medicine

## 2021-02-24 ENCOUNTER — Ambulatory Visit (INDEPENDENT_AMBULATORY_CARE_PROVIDER_SITE_OTHER): Payer: Medicare Other | Admitting: Internal Medicine

## 2021-02-24 DIAGNOSIS — I700 Atherosclerosis of aorta: Secondary | ICD-10-CM | POA: Diagnosis not present

## 2021-02-24 DIAGNOSIS — M542 Cervicalgia: Secondary | ICD-10-CM

## 2021-02-24 DIAGNOSIS — R1031 Right lower quadrant pain: Secondary | ICD-10-CM | POA: Diagnosis not present

## 2021-02-24 DIAGNOSIS — Z85819 Personal history of malignant neoplasm of unspecified site of lip, oral cavity, and pharynx: Secondary | ICD-10-CM

## 2021-02-24 DIAGNOSIS — E7800 Pure hypercholesterolemia, unspecified: Secondary | ICD-10-CM | POA: Diagnosis not present

## 2021-02-24 DIAGNOSIS — I10 Essential (primary) hypertension: Secondary | ICD-10-CM | POA: Diagnosis not present

## 2021-02-24 DIAGNOSIS — I70219 Atherosclerosis of native arteries of extremities with intermittent claudication, unspecified extremity: Secondary | ICD-10-CM

## 2021-02-24 DIAGNOSIS — D649 Anemia, unspecified: Secondary | ICD-10-CM | POA: Diagnosis not present

## 2021-02-24 DIAGNOSIS — K219 Gastro-esophageal reflux disease without esophagitis: Secondary | ICD-10-CM

## 2021-02-24 DIAGNOSIS — G40909 Epilepsy, unspecified, not intractable, without status epilepticus: Secondary | ICD-10-CM

## 2021-02-24 LAB — CBC WITH DIFFERENTIAL/PLATELET
Basophils Absolute: 0 10*3/uL (ref 0.0–0.1)
Basophils Relative: 0.6 % (ref 0.0–3.0)
Eosinophils Absolute: 0.2 10*3/uL (ref 0.0–0.7)
Eosinophils Relative: 2.5 % (ref 0.0–5.0)
HCT: 36.8 % — ABNORMAL LOW (ref 39.0–52.0)
Hemoglobin: 12.1 g/dL — ABNORMAL LOW (ref 13.0–17.0)
Lymphocytes Relative: 19.7 % (ref 12.0–46.0)
Lymphs Abs: 1.4 10*3/uL (ref 0.7–4.0)
MCHC: 32.8 g/dL (ref 30.0–36.0)
MCV: 81.5 fl (ref 78.0–100.0)
Monocytes Absolute: 0.8 10*3/uL (ref 0.1–1.0)
Monocytes Relative: 11 % (ref 3.0–12.0)
Neutro Abs: 4.7 10*3/uL (ref 1.4–7.7)
Neutrophils Relative %: 66.2 % (ref 43.0–77.0)
Platelets: 376 10*3/uL (ref 150.0–400.0)
RBC: 4.52 Mil/uL (ref 4.22–5.81)
RDW: 16.1 % — ABNORMAL HIGH (ref 11.5–15.5)
WBC: 7 10*3/uL (ref 4.0–10.5)

## 2021-02-24 LAB — BASIC METABOLIC PANEL
BUN: 18 mg/dL (ref 6–23)
CO2: 28 mEq/L (ref 19–32)
Calcium: 9.7 mg/dL (ref 8.4–10.5)
Chloride: 101 mEq/L (ref 96–112)
Creatinine, Ser: 1.04 mg/dL (ref 0.40–1.50)
GFR: 77.97 mL/min (ref >60.00)
Glucose, Bld: 101 mg/dL — ABNORMAL HIGH (ref 70–99)
Potassium: 5.1 mEq/L (ref 3.5–5.1)
Sodium: 136 mEq/L (ref 135–145)

## 2021-02-24 LAB — HEPATIC FUNCTION PANEL
ALT: 24 U/L (ref 0–53)
AST: 21 U/L (ref 0–37)
Albumin: 4.4 g/dL (ref 3.5–5.2)
Alkaline Phosphatase: 71 U/L (ref 39–117)
Bilirubin, Direct: 0.1 mg/dL (ref 0.0–0.3)
Total Bilirubin: 0.4 mg/dL (ref 0.2–1.2)
Total Protein: 7.1 g/dL (ref 6.0–8.3)

## 2021-02-24 LAB — FERRITIN: Ferritin: 16.9 ng/mL — ABNORMAL LOW (ref 22.0–322.0)

## 2021-02-24 NOTE — Progress Notes (Signed)
Patient ID: ISSAI WERLING, male   DOB: July 28, 1961, 60 y.o.   MRN: [id number]   Subjective:    Patient ID: ESTIL VALLEE, male    DOB: 1961-04-05, 60 y.o.   MRN: [id number]  HPI This visit occurred during the SARS-CoV-2 public health emergency.  Safety protocols were in place, including screening questions prior to the visit, additional usage of staff PPE, and extensive cleaning of exam room while observing appropriate contact time as indicated for disinfecting solutions.   Patient here for a scheduled follow up. Here to follow up regarding his blood pressure, cholesterol and previous abdominal pain.  Previous CT - no fluid collection, graft is patent.  Right lower extremity ultrasound - no DVT. Pain is better.  Taking miralax.  Bowels are better.  Still some discomfort, but has improved.  Due to f/u GI 03/2021.  No chest pain.  Breathing stable.  No cough or congestion. Discussed diet and exercise.  Does report persistent neck pain.  Limited rom.    Past Medical History:  Diagnosis Date   Basal cell carcinoma (BCC) of back    Basal cell carcinoma (BCC) of nasal sidewall    Dizziness    GERD (gastroesophageal reflux disease)    H/O blood clots    Hypercholesterolemia    Hypertension    hx of HBP readings   Metastatic squamous cell carcinoma ([place]) 2018   bx cervical lymph node   Vascular abnormality    VTE (venous thromboembolism) 2018   associated with chemotherapy   Past Surgical History:  Procedure Laterality Date   COLONOSCOPY     ESOPHAGOGASTRODUODENOSCOPY (EGD) WITH PROPOFOL N/A 05/24/2015   Procedure: ESOPHAGOGASTRODUODENOSCOPY (EGD) WITH PROPOFOL;  Surgeon: Manya Silvas, MD;  Location: [facility];  Service: Endoscopy;  Laterality: N/A;   IR IMAGING GUIDED PORT INSERTION  12/09/2016   LEFT HEART CATH AND CORONARY ANGIOGRAPHY N/A 12/25/2019   Procedure: LEFT HEART CATH AND CORONARY ANGIOGRAPHY;  Surgeon: Corey Skains, MD;  Location: [facility];  Service:  Cardiovascular;  Laterality: N/A;   SAVORY DILATION N/A 05/24/2015   Procedure: SAVORY DILATION;  Surgeon: Manya Silvas, MD;  Location: [facility] ENDOSCOPY;  Service: Endoscopy;  Laterality: N/A;   Family History  Problem Relation Age of Onset   Hyperlipidemia Mother    Heart disease Mother    Hypertension Mother    Alzheimer's disease Mother    Prostate cancer Father    Stroke Father    Social History   Socioeconomic History   Marital status: Married    Spouse name: Not on file   Number of children: Not on file   Years of education: Not on file   Highest education level: Not on file  Occupational History   Occupation: Dealer  Tobacco Use   Smoking status: Former    Types: Cigarettes    Quit date: 11/11/1996    Years since quitting: 24.3   Smokeless tobacco: Never  Vaping Use   Vaping Use: Never used  Substance and Sexual Activity   Alcohol use: No    Alcohol/week: 0.0 standard drinks    Comment: occasional   Drug use: No   Sexual activity: Yes  Other Topics Concern   Not on file  Social History Narrative   Not on file   Social Determinants of Health   Financial Resource Strain: Low Risk    Difficulty of Paying Living Expenses: Not hard at all  Food Insecurity: No Food Insecurity   Worried About  Running Out of Food in the Last Year: Never true   Ran Out of Food in the Last Year: Never true  Transportation Needs: No Transportation Needs   Lack of Transportation (Medical): No   Lack of Transportation (Non-Medical): No  Physical Activity: Insufficiently Active   Days of Exercise per Week: 4 days   Minutes of Exercise per Session: 20 min  Stress: No Stress Concern Present   Feeling of Stress : Not at all  Social Connections: Unknown   Frequency of Communication with Friends and Family: Not on file   Frequency of Social Gatherings with Friends and Family: Not on file   Attends Religious Services: Not on file   Active Member of Clubs or Organizations: Not on file    Attends Archivist Meetings: Not on file   Marital Status: Married    Review of Systems  Constitutional:  Negative for appetite change and unexpected weight change.  HENT:  Negative for congestion and sinus pressure.   Respiratory:  Negative for cough, chest tightness and shortness of breath.   Cardiovascular:  Negative for chest pain and palpitations.  Gastrointestinal:  Negative for diarrhea, nausea and vomiting.       Abdominal pain improved.  Still some discomfort, but is better.    Genitourinary:  Negative for difficulty urinating and dysuria.  Musculoskeletal:  Positive for neck pain. Negative for joint swelling and myalgias.  Skin:  Negative for color change and rash.  Neurological:  Negative for dizziness, light-headedness and headaches.  Psychiatric/Behavioral:  Negative for agitation and dysphoric mood.       Objective:    Physical Exam Vitals reviewed.  Constitutional:      General: He is not in acute distress.    Appearance: Normal appearance. He is well-developed.  HENT:     Head: Normocephalic and atraumatic.     Right Ear: External ear normal.     Left Ear: External ear normal.  Eyes:     General: No scleral icterus.       Right eye: No discharge.        Left eye: No discharge.     Conjunctiva/sclera: Conjunctivae normal.  Cardiovascular:     Rate and Rhythm: Normal rate and regular rhythm.  Pulmonary:     Effort: Pulmonary effort is normal. No respiratory distress.     Breath sounds: Normal breath sounds.  Abdominal:     General: Bowel sounds are normal.     Palpations: Abdomen is soft.     Tenderness: There is no abdominal tenderness.  Musculoskeletal:        General: No swelling.     Cervical back: Neck supple. No tenderness.     Comments: Neck pain - limited rom.    Lymphadenopathy:     Cervical: No cervical adenopathy.  Skin:    Findings: No erythema or rash.  Neurological:     Mental Status: He is alert.  Psychiatric:        Mood  and Affect: Mood normal.        Behavior: Behavior normal.    BP 130/78   Pulse 79   Temp (!) 97.5 F (36.4 C) (Oral)   Ht 5\' 8"  (1.727 m)   Wt 150 lb 9.6 oz (68.3 kg)   SpO2 99%   BMI 22.90 kg/m  Wt Readings from Last 3 Encounters:  02/24/21 150 lb 9.6 oz (68.3 kg)  12/18/20 151 lb 6.4 oz (68.7 kg)  08/28/20 157 lb 12.8 oz (  71.6 kg)    Outpatient Encounter Medications as of 02/24/2021  Medication Sig   DENTA 5000 PLUS 1.1 % CREA dental cream Place 1 g onto teeth daily.   ELIQUIS 5 MG TABS tablet TAKE 1 TABLET BY MOUTH EVERY 12 HOURS   lamoTRIgine (LAMICTAL) 100 MG tablet Take 100 mg by mouth 2 (two) times daily.   lamoTRIgine (LAMICTAL) 25 MG tablet Take 25 mg by mouth 2 (two) times daily.   Multiple Vitamin (MULTIVITAMIN) capsule Take by mouth.   omeprazole (PRILOSEC) 20 MG capsule TAKE 1 CAPSULE TWICE DAILY   pravastatin (PRAVACHOL) 40 MG tablet TAKE 1 TABLET BY MOUTH EVERY DAY   [DISCONTINUED] lisinopril (ZESTRIL) 30 MG tablet TAKE 1 TABLET BY MOUTH DAILY   [DISCONTINUED] nitroGLYCERIN (NITROSTAT) 0.4 MG SL tablet Place 1 tablet (0.4 mg total) under the tongue every 5 (five) minutes x 3 doses as needed for chest pain. (Patient not taking: Reported on 02/24/2021)   No facility-administered encounter medications on file as of 02/24/2021.     Lab Results  Component Value Date   WBC 7.0 02/24/2021   HGB 12.1 (L) 02/24/2021   HCT 36.8 (L) 02/24/2021   PLT 376.0 02/24/2021   GLUCOSE 101 (H) 02/24/2021   CHOL 255 (H) 12/18/2020   TRIG 145.0 12/18/2020   HDL 46.70 12/18/2020   LDLDIRECT 150.0 04/02/2020   LDLCALC 179 (H) 12/18/2020   ALT 24 02/24/2021   AST 21 02/24/2021   NA 136 02/24/2021   K 4.7 02/26/2021   CL 101 02/24/2021   CREATININE 1.04 02/24/2021   BUN 18 02/24/2021   CO2 28 02/24/2021   TSH 1.99 04/02/2020   PSA 0.94 04/02/2020   INR 1.1 12/23/2019   HGBA1C 6.0 (H) 05/23/2019    CT HEAD WO CONTRAST  Result Date: 12/23/2019 CLINICAL DATA:  Seizure,  nausea, amnesia EXAM: CT HEAD WITHOUT CONTRAST TECHNIQUE: Contiguous axial images were obtained from the base of the skull through the vertex without intravenous contrast. COMPARISON:  05/23/2019 FINDINGS: Brain: Stable encephalomalacia right temporal lobe compatible with prior infarct. No acute infarct or hemorrhage. Lateral ventricles and midline structures are unremarkable. No acute extra-axial fluid collections. No mass effect. Vascular: No hyperdense vessel or unexpected calcification. Skull: Normal. Negative for fracture or focal lesion. Sinuses/Orbits: Mucosal thickening within the left posterior ethmoid air cells. Remaining sinuses are clear. Other: None. IMPRESSION: 1. Stable chronic ischemic change.  No acute intracranial process. Electronically Signed   By: Randa Ngo M.D.   On: 12/23/2019 20:39   DG Chest Portable 1 View  Result Date: 12/23/2019 CLINICAL DATA:  Seizure, weakness EXAM: PORTABLE CHEST 1 VIEW COMPARISON:  05/23/2019 FINDINGS: The heart size and mediastinal contours are within normal limits. Both lungs are clear. The visualized skeletal structures are unremarkable. IMPRESSION: No active disease. Electronically Signed   By: Randa Ngo M.D.   On: 12/23/2019 20:39   CT ANGIO CHEST AORTA W/CM & OR WO/CM  Result Date: 12/23/2019 CLINICAL DATA:  60 year old male with remote history of or pharyngeal cancer. Aortic Disease. No trauma. EXAM: CT ANGIOGRAPHY CHEST WITH CONTRAST TECHNIQUE: Multidetector CT imaging of the chest was performed using the standard protocol during bolus administration of intravenous contrast. Multiplanar CT image reconstructions and MIPs were obtained to evaluate the vascular anatomy. CONTRAST:  163mL OMNIPAQUE IOHEXOL 350 MG/ML SOLN COMPARISON:  Chest radiograph dated 12/23/2019. FINDINGS: Cardiovascular: There is no cardiomegaly or pericardial effusion. Moderate atherosclerotic calcification of the aorta. No aneurysmal dilatation or dissection. The origins of  the  great vessels of the aortic arch appear patent as visualized. No pulmonary artery embolus identified. Mediastinum/Nodes: There is no hilar or mediastinal adenopathy. The esophagus and the thyroid gland are grossly unremarkable. No mediastinal fluid collection. Lungs/Pleura: Minimal bibasilar dependent atelectasis. No focal consolidation, pleural effusion, pneumothorax. The central airways are patent. Upper Abdomen: No acute abnormality. Musculoskeletal: No chest wall abnormality. No acute or significant osseous findings. Review of the MIP images confirms the above findings. IMPRESSION: No acute intrathoracic pathology. No CT evidence of pulmonary embolism. Electronically Signed   By: Anner Crete M.D.   On: 12/23/2019 22:33       Assessment & Plan:   Problem List Items Addressed This Visit     Abdominal pain    Increased pain - lower abdomen - right.  CT scan as outlined. No vascular abnormality noted.  Pain is better.  miralax helping with bowel movements.  Given persistent discomfort and unclear etiology for pain, was referred to GI.  Has appt scheduled in 03/2021.         Anemia    Follow cbc.        Relevant Orders   CBC with Differential/Platelet (Completed)   Ferritin (Completed)   Aortic occlusion (HCC)    S/p stent placement.  Followed by vascular surgery.        Atherosclerotic PVD with intermittent claudication (HCC)    S/p fem fem bypass.  Followed by [facility].  On eliquis.  Continue pravastatin.  Tolerating 80mg  dose.         Essential hypertension, benign    Continue lisinopril.  Blood pressure on recheck improved.  Follow pressures.  Follow metabolic panel.        Relevant Orders   Basic metabolic panel (Completed)   GERD (gastroesophageal reflux disease)    No upper symptoms related.  On prilosec.        History of malignant neoplasm of oropharynx    S/P XRT and chemo.  Followed by oncology.        Hypercholesterolemia    Continue pravastatin.  Low  cholesterol diet and exercise.  Follow lipid panel and liver function tests.         Relevant Orders   Hepatic function panel (Completed)   Neck pain    Persistent increased neck pain and limited rom.  Check c-spine xray.  Further w/up pending results.         Relevant Orders   DG Cervical Spine 2 or 3 views (Completed)   Seizure disorder ([place])    Continues on lamictal.  No recent seizures.  Follow.          Einar Pheasant, MD

## 2021-02-25 ENCOUNTER — Other Ambulatory Visit: Payer: Self-pay | Admitting: Internal Medicine

## 2021-02-25 DIAGNOSIS — E875 Hyperkalemia: Secondary | ICD-10-CM

## 2021-02-25 NOTE — Progress Notes (Signed)
Order placed for f/u potassium check.  

## 2021-02-25 NOTE — Addendum Note (Signed)
Addended by: Ezequiel Ganser on: 02/25/2021 01:39 PM   Modules accepted: Orders

## 2021-02-26 ENCOUNTER — Other Ambulatory Visit: Payer: Self-pay | Admitting: Internal Medicine

## 2021-02-26 ENCOUNTER — Other Ambulatory Visit: Payer: Self-pay

## 2021-02-26 ENCOUNTER — Other Ambulatory Visit (INDEPENDENT_AMBULATORY_CARE_PROVIDER_SITE_OTHER): Payer: Medicare Other

## 2021-02-26 DIAGNOSIS — E875 Hyperkalemia: Secondary | ICD-10-CM | POA: Diagnosis not present

## 2021-02-26 DIAGNOSIS — M542 Cervicalgia: Secondary | ICD-10-CM

## 2021-02-26 LAB — BASIC METABOLIC PANEL: Potassium: 4.7 (ref 3.4–5.3)

## 2021-02-26 LAB — POTASSIUM: Potassium: 4.7 mmol/L (ref 3.5–5.3)

## 2021-02-26 NOTE — Progress Notes (Signed)
Order placed for neurosurgery referral 

## 2021-02-27 ENCOUNTER — Other Ambulatory Visit: Payer: Self-pay | Admitting: Internal Medicine

## 2021-03-03 ENCOUNTER — Encounter: Payer: Self-pay | Admitting: Internal Medicine

## 2021-03-03 NOTE — Assessment & Plan Note (Signed)
S/p fem fem bypass.  Followed by [facility].  On eliquis.  Continue pravastatin.  Tolerating 80mg dose.   

## 2021-03-03 NOTE — Assessment & Plan Note (Signed)
Follow cbc.  

## 2021-03-03 NOTE — Assessment & Plan Note (Signed)
Increased pain - lower abdomen - right.  CT scan as outlined. No vascular abnormality noted.  Pain is better.  miralax helping with bowel movements.  Given persistent discomfort and unclear etiology for pain, was referred to GI.  Has appt scheduled in 03/2021.

## 2021-03-03 NOTE — Assessment & Plan Note (Signed)
Persistent increased neck pain and limited rom.  Check c-spine xray.  Further w/up pending results.

## 2021-03-03 NOTE — Assessment & Plan Note (Signed)
Continue pravastatin.  Low cholesterol diet and exercise.  Follow lipid panel and liver function tests.   

## 2021-03-03 NOTE — Assessment & Plan Note (Signed)
Continue lisinopril.  Blood pressure on recheck improved.  Follow pressures.  Follow metabolic panel.

## 2021-03-03 NOTE — Assessment & Plan Note (Signed)
Continues on lamictal.  No recent seizures.  Follow.  

## 2021-03-03 NOTE — Assessment & Plan Note (Signed)
S/p stent placement.  Followed by vascular surgery.

## 2021-03-03 NOTE — Assessment & Plan Note (Signed)
S/P XRT and chemo.  Followed by oncology.

## 2021-03-03 NOTE — Assessment & Plan Note (Signed)
No upper symptoms related.  On prilosec.  

## 2021-03-20 DIAGNOSIS — M542 Cervicalgia: Secondary | ICD-10-CM | POA: Diagnosis not present

## 2021-03-20 DIAGNOSIS — M5412 Radiculopathy, cervical region: Secondary | ICD-10-CM | POA: Diagnosis not present

## 2021-03-20 DIAGNOSIS — Z8589 Personal history of malignant neoplasm of other organs and systems: Secondary | ICD-10-CM | POA: Diagnosis not present

## 2021-03-21 ENCOUNTER — Other Ambulatory Visit: Payer: Self-pay | Admitting: Neurosurgery

## 2021-03-21 ENCOUNTER — Other Ambulatory Visit (HOSPITAL_COMMUNITY): Payer: Self-pay | Admitting: Neurosurgery

## 2021-03-21 DIAGNOSIS — M5412 Radiculopathy, cervical region: Secondary | ICD-10-CM

## 2021-03-21 DIAGNOSIS — Z8589 Personal history of malignant neoplasm of other organs and systems: Secondary | ICD-10-CM

## 2021-03-27 DIAGNOSIS — R1031 Right lower quadrant pain: Secondary | ICD-10-CM | POA: Diagnosis not present

## 2021-03-27 DIAGNOSIS — K222 Esophageal obstruction: Secondary | ICD-10-CM | POA: Diagnosis not present

## 2021-03-27 DIAGNOSIS — K219 Gastro-esophageal reflux disease without esophagitis: Secondary | ICD-10-CM | POA: Diagnosis not present

## 2021-03-27 DIAGNOSIS — R198 Other specified symptoms and signs involving the digestive system and abdomen: Secondary | ICD-10-CM | POA: Diagnosis not present

## 2021-03-27 DIAGNOSIS — R1319 Other dysphagia: Secondary | ICD-10-CM | POA: Diagnosis not present

## 2021-03-30 ENCOUNTER — Ambulatory Visit
Admission: RE | Admit: 2021-03-30 | Discharge: 2021-03-30 | Disposition: A | Discharge status: Home / Self Care | Payer: Medicare Other | Source: Ambulatory Visit | Attending: Neurosurgery | Admitting: Neurosurgery

## 2021-03-30 ENCOUNTER — Other Ambulatory Visit: Payer: Self-pay

## 2021-03-30 DIAGNOSIS — Z8589 Personal history of malignant neoplasm of other organs and systems: Secondary | ICD-10-CM | POA: Diagnosis not present

## 2021-03-30 DIAGNOSIS — M5412 Radiculopathy, cervical region: Secondary | ICD-10-CM | POA: Diagnosis not present

## 2021-03-30 DIAGNOSIS — M542 Cervicalgia: Secondary | ICD-10-CM | POA: Diagnosis not present

## 2021-04-03 DIAGNOSIS — R293 Abnormal posture: Secondary | ICD-10-CM | POA: Diagnosis not present

## 2021-04-03 DIAGNOSIS — M542 Cervicalgia: Secondary | ICD-10-CM | POA: Diagnosis not present

## 2021-04-07 DIAGNOSIS — R293 Abnormal posture: Secondary | ICD-10-CM | POA: Diagnosis not present

## 2021-04-07 DIAGNOSIS — M542 Cervicalgia: Secondary | ICD-10-CM | POA: Diagnosis not present

## 2021-04-10 DIAGNOSIS — M542 Cervicalgia: Secondary | ICD-10-CM | POA: Diagnosis not present

## 2021-04-10 DIAGNOSIS — M47812 Spondylosis without myelopathy or radiculopathy, cervical region: Secondary | ICD-10-CM | POA: Diagnosis not present

## 2021-04-10 DIAGNOSIS — M47819 Spondylosis without myelopathy or radiculopathy, site unspecified: Secondary | ICD-10-CM | POA: Diagnosis not present

## 2021-04-11 DIAGNOSIS — R293 Abnormal posture: Secondary | ICD-10-CM | POA: Diagnosis not present

## 2021-04-11 DIAGNOSIS — M542 Cervicalgia: Secondary | ICD-10-CM | POA: Diagnosis not present

## 2021-04-14 DIAGNOSIS — R293 Abnormal posture: Secondary | ICD-10-CM | POA: Diagnosis not present

## 2021-04-14 DIAGNOSIS — M542 Cervicalgia: Secondary | ICD-10-CM | POA: Diagnosis not present

## 2021-04-16 DIAGNOSIS — R293 Abnormal posture: Secondary | ICD-10-CM | POA: Diagnosis not present

## 2021-04-16 DIAGNOSIS — M542 Cervicalgia: Secondary | ICD-10-CM | POA: Diagnosis not present

## 2021-05-01 DIAGNOSIS — R293 Abnormal posture: Secondary | ICD-10-CM | POA: Diagnosis not present

## 2021-05-01 DIAGNOSIS — M542 Cervicalgia: Secondary | ICD-10-CM | POA: Diagnosis not present

## 2021-05-07 ENCOUNTER — Encounter: Payer: Self-pay | Admitting: *Deleted

## 2021-05-07 ENCOUNTER — Encounter: Payer: Self-pay | Admitting: Internal Medicine

## 2021-05-07 ENCOUNTER — Ambulatory Visit (INDEPENDENT_AMBULATORY_CARE_PROVIDER_SITE_OTHER): Payer: Medicare Other | Admitting: Internal Medicine

## 2021-05-07 ENCOUNTER — Other Ambulatory Visit: Payer: Self-pay

## 2021-05-07 VITALS — BP 142/90 | HR 81 | Temp 97.9°F | Resp 16 | Ht 68.0 in | Wt 151.4 lb

## 2021-05-07 DIAGNOSIS — I10 Essential (primary) hypertension: Secondary | ICD-10-CM | POA: Diagnosis not present

## 2021-05-07 DIAGNOSIS — Z85819 Personal history of malignant neoplasm of unspecified site of lip, oral cavity, and pharynx: Secondary | ICD-10-CM

## 2021-05-07 DIAGNOSIS — K219 Gastro-esophageal reflux disease without esophagitis: Secondary | ICD-10-CM

## 2021-05-07 DIAGNOSIS — M542 Cervicalgia: Secondary | ICD-10-CM

## 2021-05-07 DIAGNOSIS — Z125 Encounter for screening for malignant neoplasm of prostate: Secondary | ICD-10-CM

## 2021-05-07 DIAGNOSIS — I70219 Atherosclerosis of native arteries of extremities with intermittent claudication, unspecified extremity: Secondary | ICD-10-CM

## 2021-05-07 DIAGNOSIS — G40909 Epilepsy, unspecified, not intractable, without status epilepticus: Secondary | ICD-10-CM

## 2021-05-07 DIAGNOSIS — E7800 Pure hypercholesterolemia, unspecified: Secondary | ICD-10-CM

## 2021-05-07 DIAGNOSIS — D649 Anemia, unspecified: Secondary | ICD-10-CM | POA: Diagnosis not present

## 2021-05-07 LAB — HEPATIC FUNCTION PANEL
ALT: 18 U/L (ref 0–53)
AST: 20 U/L (ref 0–37)
Albumin: 4.4 g/dL (ref 3.5–5.2)
Alkaline Phosphatase: 72 U/L (ref 39–117)
Bilirubin, Direct: 0.1 mg/dL (ref 0.0–0.3)
Total Bilirubin: 0.4 mg/dL (ref 0.2–1.2)
Total Protein: 7.6 g/dL (ref 6.0–8.3)

## 2021-05-07 LAB — LIPID PANEL
Cholesterol: 246 mg/dL — ABNORMAL HIGH (ref 0–200)
HDL: 49.9 mg/dL (ref >39.00)
LDL Cholesterol: 165 mg/dL — ABNORMAL HIGH (ref 0–99)
NonHDL: 196.23
Total CHOL/HDL Ratio: 5
Triglycerides: 155 mg/dL — ABNORMAL HIGH (ref 0.0–149.0)
VLDL: 31 mg/dL (ref 0.0–40.0)

## 2021-05-07 LAB — BASIC METABOLIC PANEL
BUN: 14 mg/dL (ref 6–23)
CO2: 29 mEq/L (ref 19–32)
Calcium: 10 mg/dL (ref 8.4–10.5)
Chloride: 102 mEq/L (ref 96–112)
Creatinine, Ser: 1.16 mg/dL (ref 0.40–1.50)
GFR: 68.3 mL/min (ref >60.00)
Glucose, Bld: 92 mg/dL (ref 70–99)
Potassium: 5.2 mEq/L — ABNORMAL HIGH (ref 3.5–5.1)
Sodium: 138 mEq/L (ref 135–145)

## 2021-05-07 LAB — CBC WITH DIFFERENTIAL/PLATELET
Basophils Absolute: 0.1 10*3/uL (ref 0.0–0.1)
Basophils Relative: 0.7 % (ref 0.0–3.0)
Eosinophils Absolute: 0.1 10*3/uL (ref 0.0–0.7)
Eosinophils Relative: 2 % (ref 0.0–5.0)
HCT: 37.7 % — ABNORMAL LOW (ref 39.0–52.0)
Hemoglobin: 12.1 g/dL — ABNORMAL LOW (ref 13.0–17.0)
Lymphocytes Relative: 19.1 % (ref 12.0–46.0)
Lymphs Abs: 1.3 10*3/uL (ref 0.7–4.0)
MCHC: 32.2 g/dL (ref 30.0–36.0)
MCV: 83.4 fl (ref 78.0–100.0)
Monocytes Absolute: 0.7 10*3/uL (ref 0.1–1.0)
Monocytes Relative: 10.1 % (ref 3.0–12.0)
Neutro Abs: 4.8 10*3/uL (ref 1.4–7.7)
Neutrophils Relative %: 68.1 % (ref 43.0–77.0)
Platelets: 412 10*3/uL — ABNORMAL HIGH (ref 150.0–400.0)
RBC: 4.51 Mil/uL (ref 4.22–5.81)
RDW: 14.8 % (ref 11.5–15.5)
WBC: 7 10*3/uL (ref 4.0–10.5)

## 2021-05-07 LAB — TSH: TSH: 2.02 u[IU]/mL (ref 0.35–5.50)

## 2021-05-07 LAB — PSA: PSA: 2.08 ng/mL (ref 0.10–4.00)

## 2021-05-07 LAB — FERRITIN: Ferritin: 14.4 ng/mL — ABNORMAL LOW (ref 22.0–322.0)

## 2021-05-07 MED ORDER — PRAVASTATIN SODIUM 80 MG PO TABS: 80.0000 mg | ORAL_TABLET | Freq: Every day | ORAL | 1 refills | Status: DC

## 2021-05-07 NOTE — Progress Notes (Signed)
[name], male   DOB: September 09, 1960, 60 y.o.   MRN: [id number]   Subjective:    [name], male    DOB: November 22, 1960, 60 y.o.   MRN: [id number]  This visit occurred during the SARS-CoV-2 public health emergency.  Safety protocols were in place, including screening questions prior to the visit, additional usage of staff PPE, and extensive cleaning of exam room while observing appropriate contact time as indicated for disinfecting solutions.   Patient here for scheduled follow up.   Chief Complaint  Patient presents with   Hyperlipidemia   Hypertension   .   HPI Has had problems with increased neck and left shoulder pain.  Seeing neurosurgery.  MRI - multilevel degenerative changes most concerning for facet arthropathy. Recommended PT.  If no improvement - pain clinic for facet injections.  Persistent pain.  Plans to f/u with [facility].  No chest pain.  Breathing stable. Also here to follow up regarding abdominal pain.  Pain is better.  Planning for colonoscopy and EGD this week.  Recommended maintenance miralax.  Still some difficulty swallowing.  Eating. No vomiting.     Past Medical History:  Diagnosis Date   Basal cell carcinoma (BCC) of back    Basal cell carcinoma (BCC) of nasal sidewall    Dizziness    GERD (gastroesophageal reflux disease)    H/O blood clots    Headache    migraines   Hypercholesterolemia    Hypertension    hx of HBP readings   Metastatic squamous cell carcinoma ([place]) 2018   bx cervical lymph node   Seizures ([place]) 2021   Tonsillar cancer (H[facility])    Vascular abnormality    VTE (venous thromboembolism) 2018   associated with chemotherapy   Past Surgical History:  Procedure Laterality Date   COLONOSCOPY     COLONOSCOPY N/A 05/08/2021   Procedure: COLONOSCOPY;  Surgeon: Annamaria Helling, DO;  Location: [facility];  Service: Gastroenterology;  Laterality: N/A;   endovascular balloon angioplasty femoral/popliteal artery  w/stent Left 11/08/2017   ESOPHAGOGASTRODUODENOSCOPY N/A 05/08/2021   Procedure: ESOPHAGOGASTRODUODENOSCOPY (EGD);  Surgeon: Annamaria Helling, DO;  Location: [facility] ENDOSCOPY;  Service: Gastroenterology;  Laterality: N/A;   ESOPHAGOGASTRODUODENOSCOPY (EGD) WITH PROPOFOL N/A 05/24/2015   Procedure: ESOPHAGOGASTRODUODENOSCOPY (EGD) WITH PROPOFOL;  Surgeon: Manya Silvas, MD;  Location: [facility] ENDOSCOPY;  Service: Endoscopy;  Laterality: N/A;   FEMORAL-FEMORAL BYPASS GRAFT     IR IMAGING GUIDED PORT INSERTION  12/09/2016   LEFT HEART CATH AND CORONARY ANGIOGRAPHY N/A 12/25/2019   Procedure: LEFT HEART CATH AND CORONARY ANGIOGRAPHY;  Surgeon: Corey Skains, MD;  Location: [place] [facility];  Service: Cardiovascular;  Laterality: N/A;   SAVORY DILATION N/A 05/24/2015   Procedure: SAVORY DILATION;  Surgeon: Manya Silvas, MD;  Location: [facility] ENDOSCOPY;  Service: Endoscopy;  Laterality: N/A;   Family History  Problem Relation Age of Onset   Hyperlipidemia Mother    Heart disease Mother    Hypertension Mother    Alzheimer's disease Mother    Prostate cancer Father    Stroke Father    Social History   Socioeconomic History   Marital status: Married    Spouse name: Not on file   Number of children: Not on file   Years of education: Not on file   Highest education level: Not on file  Occupational History   Occupation: Dealer  Tobacco Use   Smoking status: Former    Types: Cigarettes  Quit date: 11/11/1996    Years since quitting: 24.5   Smokeless tobacco: Never  Vaping Use   Vaping Use: Never used  Substance and Sexual Activity   Alcohol use: Yes    Comment: occasional   Drug use: No   Sexual activity: Yes  Other Topics Concern   Not on file  Social History Narrative   Not on file   Social Determinants of Health   Financial Resource Strain: Not on file  Food Insecurity: Not on file  Transportation Needs: Not on file  Physical Activity: Not on file  Stress:  Not on file  Social Connections: Not on file     Review of Systems  Constitutional:  Negative for appetite change and unexpected weight change.  HENT:  Negative for congestion and sinus pressure.   Respiratory:  Negative for cough, chest tightness and shortness of breath.   Cardiovascular:  Negative for chest pain, palpitations and leg swelling.  Gastrointestinal:  Negative for abdominal pain, nausea and vomiting.  Genitourinary:  Negative for difficulty urinating and dysuria.  Musculoskeletal:  Positive for neck pain. Negative for joint swelling and myalgias.  Skin:  Negative for color change and rash.  Neurological:  Negative for dizziness, light-headedness and headaches.  Psychiatric/Behavioral:  Negative for agitation and dysphoric mood.       Objective:     BP (!) 142/90   Pulse 81   Temp 97.9 F (36.6 C)   Resp 16   Ht 5\' 8"  (1.727 m)   Wt 151 lb 6.4 oz (68.7 kg)   SpO2 98%   BMI 23.02 kg/m  Wt Readings from Last 3 Encounters:  05/08/21 150 lb (68 kg)  05/07/21 151 lb 6.4 oz (68.7 kg)  02/24/21 150 lb 9.6 oz (68.3 kg)    Physical Exam Vitals reviewed.  Constitutional:      General: He is not in acute distress.    Appearance: Normal appearance. He is well-developed.  HENT:     Head: Normocephalic and atraumatic.     Right Ear: External ear normal.     Left Ear: External ear normal.  Eyes:     General: No scleral icterus.       Right eye: No discharge.        Left eye: No discharge.     Conjunctiva/sclera: Conjunctivae normal.  Cardiovascular:     Rate and Rhythm: Normal rate and regular rhythm.  Pulmonary:     Effort: Pulmonary effort is normal. No respiratory distress.     Breath sounds: Normal breath sounds.  Abdominal:     General: Bowel sounds are normal.     Palpations: Abdomen is soft.     Tenderness: There is no abdominal tenderness.  Musculoskeletal:        General: No swelling or tenderness.     Cervical back: Neck supple. No tenderness.   Lymphadenopathy:     Cervical: No cervical adenopathy.  Skin:    Findings: No erythema or rash.  Neurological:     Mental Status: He is alert.  Psychiatric:        Mood and Affect: Mood normal.        Behavior: Behavior normal.     Outpatient Encounter Medications as of 05/07/2021  Medication Sig   ELIQUIS 5 MG TABS tablet TAKE 1 TABLET BY MOUTH EVERY 12 HOURS   lamoTRIgine (LAMICTAL) 100 MG tablet Take 100 mg by mouth 2 (two) times daily.   lamoTRIgine (LAMICTAL) 25 MG tablet Take 25  mg by mouth 2 (two) times daily.   lisinopril (ZESTRIL) 30 MG tablet TAKE 1 TABLET BY MOUTH DAILY   Multiple Vitamin (MULTIVITAMIN) capsule Take by mouth.   omeprazole (PRILOSEC) 20 MG capsule TAKE 1 CAPSULE TWICE DAILY   pravastatin (PRAVACHOL) 80 MG tablet Take 1 tablet (80 mg total) by mouth at bedtime.   [DISCONTINUED] DENTA 5000 PLUS 1.1 % CREA dental cream Place 1 g onto teeth daily. (Patient not taking: Reported on 05/08/2021)   [DISCONTINUED] pravastatin (PRAVACHOL) 40 MG tablet TAKE 1 TABLET BY MOUTH EVERY DAY   [DISCONTINUED] pravastatin (PRAVACHOL) 80 MG tablet Take 80 mg by mouth at bedtime.   No facility-administered encounter medications on file as of 05/07/2021.     Lab Results  Component Value Date   WBC 7.0 05/07/2021   HGB 12.1 (L) 05/07/2021   HCT 37.7 (L) 05/07/2021   PLT 412.0 (H) 05/07/2021   GLUCOSE 92 05/07/2021   CHOL 246 (H) 05/07/2021   TRIG 155.0 (H) 05/07/2021   HDL 49.90 05/07/2021   LDLDIRECT 150.0 04/02/2020   LDLCALC 165 (H) 05/07/2021   ALT 18 05/07/2021   AST 20 05/07/2021   NA 138 05/07/2021   K 5.2 No hemolysis seen (H) 05/07/2021   CL 102 05/07/2021   CREATININE 1.16 05/07/2021   BUN 14 05/07/2021   CO2 29 05/07/2021   TSH 2.02 05/07/2021   PSA 2.08 05/07/2021   INR 1.1 12/23/2019   HGBA1C 6.0 (H) 05/23/2019    MR CERVICAL SPINE WO CONTRAST  Result Date: 03/31/2021 CLINICAL DATA:  Cervical radiculopathy M54.12 (ICD-10-CM). Hx of malignant  neoplasm of head and neck Z85.89 (ICD-10-CM). Additional history provided: Patient reports neck pain for greater than 2 years, pain radiates into left shoulder, progressively worsening. Patient reports history of throat cancer treated with radiation and chemotherapy. EXAM: MRI CERVICAL SPINE WITHOUT CONTRAST TECHNIQUE: Multiplanar, multisequence MR imaging of the cervical spine was performed. No intravenous contrast was administered. COMPARISON:  Cervical spine radiographs 01/25/2021. MRI of the cervical spine 07/09/2012. FINDINGS: Alignment: Straightening of the expected cervical lordosis. 2 mm C2-C3 and C3-C4 grade 1 anterolisthesis. Vertebrae: Vertebral body height is maintained. Edema within the left C2 and C3 pedicles/articular pillars, likely degenerative and related to facet arthrosis at this site. Elsewhere, no significant marrow edema or focal suspicious osseous lesion is identified. Cord: No spinal cord signal abnormality is identified. Posterior Fossa, vertebral arteries, paraspinal tissues: No abnormality identified within included portions of the posterior fossa. Flow voids preserved within the imaged cervical vertebral arteries. Duplication of the left vertebral artery is questioned. Paraspinal soft tissues unremarkable. Disc levels: Mild-to-moderate disc degeneration at C4-C5. No more than mild disc degeneration at the remaining levels. C2-C3: 2 mm grade 1 anterolisthesis, new from the prior MRI. Slight disc uncovering. Mild facet arthrosis on the left, progressed. No significant spinal canal stenosis or neural foraminal narrowing. C3-C4: 2 mm grade 1 anterolisthesis, new from the prior MRI. Slight disc uncovering and disc bulge asymmetric to the right, new. Mild uncovertebral hypertrophy and facet arthrosis on the right, progressed. Mild facet arthrosis on the right. Small right facet joint effusion. No significant spinal canal stenosis or neural foraminal narrowing. C4-C5: Posterior disc osteophyte  complex with left-sided disc osteophyte ridge/uncinate hypertrophy. Mild uncinate hypertrophy also present on the right. Minimal partial effacement of the ventral thecal sac (without spinal cord mass effect). Mild left neural foraminal narrowing. C5-C6: Shallow disc bulge with right-sided disc osteophyte ridge/uncinate hypertrophy, progressed. Superimposed small right center disc protrusion, new  from the prior exam. (Series 5, image 5) (series 9, image 18). Uncovertebral hypertrophy is also present on the left. Minimal facet arthrosis. Minimal partial effacement of the ventral thecal sac (without spinal cord mass effect). No significant foraminal stenosis. C6-C7: Shallow disc bulge. No significant spinal canal or foraminal stenosis. C7-T1: Minimal facet arthrosis. No significant disc herniation or stenosis. IMPRESSION: Cervical spondylosis, as outlined and having progressed at several levels since the prior MRI of 07/09/2012. No more than mild relative spinal canal narrowing (without spinal cord mass effect). Mild left neural foraminal narrowing at C4-C5. Marrow edema within the left C2-C3 pedicles/articular pillars, likely degenerative and related to facet arthrosis at this site. Small right C3-C4 facet joint effusion. Straightening of the expected cervical lordosis. 2 mm grade 1 anterolisthesis at C2-C3 and C3-C4, new from the prior exam. Electronically Signed   By: Kellie Simmering D.O.   On: 03/31/2021 08:20       Assessment & Plan:   Problem List Items Addressed This Visit     Anemia    Follow cbc.       Relevant Orders   CBC with Differential/Platelet (Completed)   Ferritin (Completed)   Atherosclerotic PVD with intermittent claudication (HCC)    S/p fem fem bypass.  Followed by [facility].  On eliquis.  Continue pravastatin.  Tolerating 80mg  dose.        Relevant Medications   pravastatin (PRAVACHOL) 80 MG tablet   Essential hypertension, benign - Primary    Continue lisinopril.  Follow pressures.   Follow metabolic panel. Discussed elevated blood pressure.  Desires no adjustment in medication.  Follow.       Relevant Medications   pravastatin (PRAVACHOL) 80 MG tablet   Other Relevant Orders   Basic metabolic panel (Completed)   GERD (gastroesophageal reflux disease)    No upper symptoms related.  On prilosec.       History of malignant neoplasm of oropharynx    S/p XRT and chemo.  Followed by oncology.       Hypercholesterolemia    Continue pravastatin.  Low cholesterol diet and exercise.  Follow lipid panel and liver function tests.        Relevant Medications   pravastatin (PRAVACHOL) 80 MG tablet   Other Relevant Orders   Hepatic function panel (Completed)   Lipid panel (Completed)   TSH (Completed)   Neck pain    Seeing neurosurgery.   MRI -   multilevel degenerative changes most concerning for facet arthropathy. PT.  Discussed possible pain clinic referral.         Seizure disorder ([place])    Continues on lamictal.  No recent seizures.  Follow.       Other Visit Diagnoses     Prostate cancer screening       Relevant Orders   PSA (Completed)        Einar Pheasant, MD

## 2021-05-08 ENCOUNTER — Ambulatory Visit: Payer: Medicare Other | Admitting: Anesthesiology

## 2021-05-08 ENCOUNTER — Ambulatory Visit
Admission: RE | Admit: 2021-05-08 | Discharge: 2021-05-08 | Disposition: A | Discharge status: Home / Self Care | Payer: Medicare Other | Attending: Gastroenterology | Admitting: Gastroenterology

## 2021-05-08 ENCOUNTER — Encounter
Admission: RE | Disposition: A | Discharge status: Home / Self Care | Payer: Self-pay | Source: Home / Self Care | Attending: Gastroenterology

## 2021-05-08 ENCOUNTER — Encounter: Payer: Self-pay | Admitting: *Deleted

## 2021-05-08 DIAGNOSIS — Z888 Allergy status to other drugs, medicaments and biological substances status: Secondary | ICD-10-CM | POA: Diagnosis not present

## 2021-05-08 DIAGNOSIS — R1319 Other dysphagia: Secondary | ICD-10-CM | POA: Diagnosis not present

## 2021-05-08 DIAGNOSIS — Z86718 Personal history of other venous thrombosis and embolism: Secondary | ICD-10-CM | POA: Diagnosis not present

## 2021-05-08 DIAGNOSIS — K649 Unspecified hemorrhoids: Secondary | ICD-10-CM | POA: Diagnosis not present

## 2021-05-08 DIAGNOSIS — Z85828 Personal history of other malignant neoplasm of skin: Secondary | ICD-10-CM | POA: Insufficient documentation

## 2021-05-08 DIAGNOSIS — Z9221 Personal history of antineoplastic chemotherapy: Secondary | ICD-10-CM | POA: Insufficient documentation

## 2021-05-08 DIAGNOSIS — Z85818 Personal history of malignant neoplasm of other sites of lip, oral cavity, and pharynx: Secondary | ICD-10-CM | POA: Diagnosis not present

## 2021-05-08 DIAGNOSIS — K219 Gastro-esophageal reflux disease without esophagitis: Secondary | ICD-10-CM | POA: Diagnosis not present

## 2021-05-08 DIAGNOSIS — Z7982 Long term (current) use of aspirin: Secondary | ICD-10-CM | POA: Diagnosis not present

## 2021-05-08 DIAGNOSIS — K222 Esophageal obstruction: Secondary | ICD-10-CM | POA: Diagnosis not present

## 2021-05-08 DIAGNOSIS — R1310 Dysphagia, unspecified: Secondary | ICD-10-CM | POA: Diagnosis not present

## 2021-05-08 DIAGNOSIS — Z87891 Personal history of nicotine dependence: Secondary | ICD-10-CM | POA: Diagnosis not present

## 2021-05-08 DIAGNOSIS — K625 Hemorrhage of anus and rectum: Secondary | ICD-10-CM | POA: Diagnosis not present

## 2021-05-08 DIAGNOSIS — Z95820 Peripheral vascular angioplasty status with implants and grafts: Secondary | ICD-10-CM | POA: Insufficient documentation

## 2021-05-08 DIAGNOSIS — Z923 Personal history of irradiation: Secondary | ICD-10-CM | POA: Diagnosis not present

## 2021-05-08 DIAGNOSIS — K5900 Constipation, unspecified: Secondary | ICD-10-CM | POA: Diagnosis not present

## 2021-05-08 DIAGNOSIS — R1031 Right lower quadrant pain: Secondary | ICD-10-CM | POA: Diagnosis not present

## 2021-05-08 DIAGNOSIS — K2950 Unspecified chronic gastritis without bleeding: Secondary | ICD-10-CM | POA: Insufficient documentation

## 2021-05-08 DIAGNOSIS — K640 First degree hemorrhoids: Secondary | ICD-10-CM | POA: Diagnosis not present

## 2021-05-08 DIAGNOSIS — K5710 Diverticulosis of small intestine without perforation or abscess without bleeding: Secondary | ICD-10-CM | POA: Diagnosis not present

## 2021-05-08 DIAGNOSIS — E7800 Pure hypercholesterolemia, unspecified: Secondary | ICD-10-CM | POA: Diagnosis not present

## 2021-05-08 DIAGNOSIS — K2970 Gastritis, unspecified, without bleeding: Secondary | ICD-10-CM | POA: Diagnosis not present

## 2021-05-08 DIAGNOSIS — Z79899 Other long term (current) drug therapy: Secondary | ICD-10-CM | POA: Diagnosis not present

## 2021-05-08 DIAGNOSIS — R198 Other specified symptoms and signs involving the digestive system and abdomen: Secondary | ICD-10-CM | POA: Diagnosis not present

## 2021-05-08 HISTORY — PX: ESOPHAGOGASTRODUODENOSCOPY: SHX5428

## 2021-05-08 HISTORY — DX: Malignant neoplasm of tonsil, unspecified: C09.9

## 2021-05-08 HISTORY — PX: COLONOSCOPY: SHX5424

## 2021-05-08 HISTORY — DX: Headache, unspecified: R51.9

## 2021-05-08 SURGERY — COLONOSCOPY
Anesthesia: General

## 2021-05-08 MED ORDER — PROPOFOL 500 MG/50ML IV EMUL
INTRAVENOUS | Status: DC | PRN
  Administered 2021-05-08: 150 ug/kg/min via INTRAVENOUS

## 2021-05-08 MED ORDER — SODIUM CHLORIDE 0.9 % IV SOLN: INTRAVENOUS | Status: DC

## 2021-05-08 MED ORDER — LIDOCAINE HCL (PF) 2 % IJ SOLN
INTRAMUSCULAR | Status: AC
  Filled 2021-05-08: qty 5

## 2021-05-08 MED ORDER — GLYCOPYRROLATE 0.2 MG/ML IJ SOLN
INTRAMUSCULAR | Status: DC | PRN
  Administered 2021-05-08: .2 mg via INTRAVENOUS

## 2021-05-08 MED ORDER — PROPOFOL 10 MG/ML IV BOLUS
INTRAVENOUS | Status: DC | PRN
Start: 2021-05-08 — End: 2021-05-08
  Administered 2021-05-08: 70 mg via INTRAVENOUS

## 2021-05-08 MED ORDER — PHENYLEPHRINE HCL (PRESSORS) 10 MG/ML IV SOLN
INTRAVENOUS | Status: AC
  Filled 2021-05-08: qty 1

## 2021-05-08 MED ORDER — LIDOCAINE 2% (20 MG/ML) 5 ML SYRINGE
INTRAMUSCULAR | Status: DC | PRN
  Administered 2021-05-08: 100 mg via INTRAVENOUS

## 2021-05-08 MED ORDER — PHENYLEPHRINE HCL (PRESSORS) 10 MG/ML IV SOLN
INTRAVENOUS | Status: DC | PRN
  Administered 2021-05-08: 100 ug via INTRAVENOUS

## 2021-05-08 NOTE — Consult Note (Signed)
[facility] Pre-Procedure H&P   [name] is a 60 y.o. male.  Gastroenterology Provider: Annamaria Helling, DO  Referring Provider: Octavia Bruckner, PA PCP: Einar Pheasant, MD  Date: 05/08/2021  HPI Mr. Timothy Garrison is a 60 y.o. male who presents today for Esophagogastroduodenoscopy and Colonoscopy for right lower quadrant pain, constipation and dysphagia.  6 months of RLQ pain with constipation. Pain defined as increased pressure in the abdomen. No change with food or bowel movement. BRBPR noted on tissue paper.  [name] is well controlled on 20 mg omeprazole. He has ntoed some solid food dysphagia with suprasternal notch sticking. Underwent EGD in 2016 for similar sx and noted to have Schatzki's ring and was dilated with 35 Fr savary with resolution of sx. Negative for HP.  History of oropharyngeal cancer s/p radiation and chemo.  Eliquis has been held for 2 days  Colonoscopy 06/2013- normal with internal hemorrhoids  Patient denies nausea, vomiting, coffee ground emesis, hematemesis, diarrhea, melena, hematochezia, fever, chills, odynophagia, jaundice, heartburn/reflux.   Past Medical History:  Diagnosis Date   Basal cell carcinoma (BCC) of back    Basal cell carcinoma (BCC) of nasal sidewall    Dizziness    GERD (gastroesophageal reflux disease)    H/O blood clots    Headache    migraines   Hypercholesterolemia    Hypertension    hx of HBP readings   Metastatic squamous cell carcinoma ([facility]) 2018   bx cervical lymph node   Tonsillar cancer (HCC)    Vascular abnormality    VTE (venous thromboembolism) 2018   associated with chemotherapy    Past Surgical History:  Procedure Laterality Date   COLONOSCOPY     endovascular balloon angioplasty femoral/popliteal artery w/stent Left 11/08/2017   ESOPHAGOGASTRODUODENOSCOPY (EGD) WITH PROPOFOL N/A 05/24/2015   Procedure: ESOPHAGOGASTRODUODENOSCOPY (EGD) WITH PROPOFOL;  Surgeon: Manya Silvas, MD;  Location: [facility] ENDOSCOPY;  Service: Endoscopy;  Laterality: N/A;   FEMORAL-FEMORAL BYPASS GRAFT     IR IMAGING GUIDED PORT INSERTION  12/09/2016   LEFT HEART CATH AND CORONARY ANGIOGRAPHY N/A 12/25/2019   Procedure: LEFT HEART CATH AND CORONARY ANGIOGRAPHY;  Surgeon: Corey Skains, MD;  Location: [place] [facility];  Service: Cardiovascular;  Laterality: N/A;   SAVORY DILATION N/A 05/24/2015   Procedure: SAVORY DILATION;  Surgeon: Manya Silvas, MD;  Location: [facility] ENDOSCOPY;  Service: Endoscopy;  Laterality: N/A;    Family History No h/o GI disease or malignancy  Review of Systems  Constitutional:  Negative for activity change, appetite change, chills, fatigue, fever and unexpected weight change.  HENT:  Positive for trouble swallowing. Negative for voice change.   Respiratory:  Negative for shortness of breath.   Cardiovascular:  Negative for chest pain and palpitations.  Gastrointestinal:  Positive for abdominal pain (RLQ) and constipation. Negative for abdominal distention, anal bleeding, blood in stool, diarrhea, nausea and vomiting.  Musculoskeletal:  Negative for arthralgias and myalgias.  Skin:  Negative for color change and pallor.  Neurological:  Negative for dizziness, syncope and weakness.  Psychiatric/Behavioral:  Negative for confusion. The patient is not nervous/anxious.   All other systems reviewed and are negative.   Medications No current facility-administered medications on file prior to encounter.   Current Outpatient Medications on File Prior to Encounter  Medication Sig Dispense Refill   aspirin EC 81 MG tablet Take 81 mg by mouth daily. Swallow whole.     nitroGLYCERIN (NITROSTAT) 0.4 MG SL tablet Place 0.4  mg under the tongue every 5 (five) minutes as needed for chest pain.     DENTA 5000 PLUS 1.1 % CREA dental cream Place 1 g onto teeth daily.     ELIQUIS 5 MG TABS tablet TAKE 1 TABLET BY MOUTH EVERY 12 HOURS 180 tablet 1   lamoTRIgine  (LAMICTAL) 100 MG tablet Take 100 mg by mouth 2 (two) times daily.     lamoTRIgine (LAMICTAL) 25 MG tablet Take 25 mg by mouth 2 (two) times daily.     lisinopril (ZESTRIL) 30 MG tablet TAKE 1 TABLET BY MOUTH DAILY 30 tablet 3   Multiple Vitamin (MULTIVITAMIN) capsule Take by mouth.     omeprazole (PRILOSEC) 20 MG capsule TAKE 1 CAPSULE TWICE DAILY 180 capsule 2    Pertinent medications related to GI and procedure were reviewed by me with the patient prior to the procedure   Current Facility-Administered Medications:    0.9 %  sodium chloride infusion, , Intravenous, Continuous, Annamaria Helling, DO      Allergies  Allergen Reactions   Atorvastatin    Allergies were reviewed by me prior to the procedure  Objective    Vitals:   05/08/21 0756  BP: 133/76  Pulse: 88  Resp: 18  Temp: (!) 96.3 F (35.7 C)  TempSrc: Temporal  SpO2: 100%  Weight: 68 kg  Height: 5\' 8"  (1.727 m)     Physical Exam Vitals and nursing note reviewed.  Constitutional:      General: He is not in acute distress.    Appearance: Normal appearance. He is not ill-appearing, toxic-appearing or diaphoretic.  HENT:     Head: Normocephalic and atraumatic.     Nose: Nose normal.     Mouth/Throat:     Mouth: Mucous membranes are moist.     Pharynx: Oropharynx is clear.  Eyes:     General: No scleral icterus.    Extraocular Movements: Extraocular movements intact.  Cardiovascular:     Rate and Rhythm: Normal rate and regular rhythm.     Heart sounds: Normal heart sounds. No murmur heard.   No friction rub. No gallop.  Pulmonary:     Effort: Pulmonary effort is normal. No respiratory distress.     Breath sounds: Normal breath sounds. No wheezing, rhonchi or rales.  Abdominal:     General: Abdomen is flat. Bowel sounds are normal. There is no distension.     Palpations: Abdomen is soft.     Tenderness: There is no abdominal tenderness. There is no guarding or rebound.  Musculoskeletal:      Cervical back: Neck supple.     Right lower leg: No edema.     Left lower leg: No edema.  Skin:    General: Skin is warm and dry.     Coloration: Skin is not jaundiced or pale.  Neurological:     General: No focal deficit present.     Mental Status: He is alert and oriented to person, place, and time. Mental status is at baseline.  Psychiatric:        Mood and Affect: Mood normal.        Behavior: Behavior normal.        Thought Content: Thought content normal.        Judgment: Judgment normal.     Assessment:  Mr. ZENAS SANTA is a 60 y.o. male  who presents today for Esophagogastroduodenoscopy and Colonoscopy for right lower quadrant pain, constipation and dysphagia.  Plan:  Esophagogastroduodenoscopy  and Colonoscopy with possible intervention today  Esophagogastroduodenoscopy and colonoscopy with possible biopsy, control of bleeding, polypectomy, and interventions as necessary has been discussed with the patient/patient representative. Informed consent was obtained from the patient/patient representative after explaining the indication, nature, and risks of the procedure including but not limited to death, bleeding, perforation, missed neoplasm/lesions, cardiorespiratory compromise, and reaction to medications. Opportunity for questions was given and appropriate answers were provided. Patient/patient representative has verbalized understanding is amenable to undergoing the procedure.   Annamaria Helling, DO  [facility]  Portions of the record may have been created with voice recognition software. Occasional wrong-word or 'sound-a-like' substitutions may have occurred due to the inherent limitations of voice recognition software.  Read the chart carefully and recognize, using context, where substitutions may have occurred.

## 2021-05-08 NOTE — H&P (View-Only) (Signed)
[facility] Pre-Procedure H&P   [name] is a 60 y.o. male.  Gastroenterology Provider: Annamaria Helling, DO  Referring Provider: Octavia Bruckner, PA PCP: Einar Pheasant, MD  Date: 05/08/2021  HPI Mr. Timothy Garrison is a 60 y.o. male who presents today for Esophagogastroduodenoscopy and Colonoscopy for right lower quadrant pain, constipation and dysphagia.  6 months of RLQ pain with constipation. Pain defined as increased pressure in the abdomen. No change with food or bowel movement. BRBPR noted on tissue paper.  [name] is well controlled on 20 mg omeprazole. He has ntoed some solid food dysphagia with suprasternal notch sticking. Underwent EGD in 2016 for similar sx and noted to have Schatzki's ring and was dilated with 84 Fr savary with resolution of sx. Negative for HP.  History of oropharyngeal cancer s/p radiation and chemo.  Eliquis has been held for 2 days  Colonoscopy 06/2013- normal with internal hemorrhoids  Patient denies nausea, vomiting, coffee ground emesis, hematemesis, diarrhea, melena, hematochezia, fever, chills, odynophagia, jaundice, heartburn/reflux.   Past Medical History:  Diagnosis Date   Basal cell carcinoma (BCC) of back    Basal cell carcinoma (BCC) of nasal sidewall    Dizziness    GERD (gastroesophageal reflux disease)    H/O blood clots    Headache    migraines   Hypercholesterolemia    Hypertension    hx of HBP readings   Metastatic squamous cell carcinoma (HCC) 2018   bx cervical lymph node   Tonsillar cancer (HCC)    Vascular abnormality    VTE (venous thromboembolism) 2018   associated with chemotherapy    Past Surgical History:  Procedure Laterality Date   COLONOSCOPY     endovascular balloon angioplasty femoral/popliteal artery w/stent Left 11/08/2017   ESOPHAGOGASTRODUODENOSCOPY (EGD) WITH PROPOFOL N/A 05/24/2015   Procedure: ESOPHAGOGASTRODUODENOSCOPY (EGD) WITH PROPOFOL;  Surgeon: Manya Silvas, MD;  Location: [facility];  Service: Endoscopy;  Laterality: N/A;   FEMORAL-FEMORAL BYPASS GRAFT     IR IMAGING GUIDED PORT INSERTION  12/09/2016   LEFT HEART CATH AND CORONARY ANGIOGRAPHY N/A 12/25/2019   Procedure: LEFT HEART CATH AND CORONARY ANGIOGRAPHY;  Surgeon: Corey Skains, MD;  Location: [facility];  Service: Cardiovascular;  Laterality: N/A;   SAVORY DILATION N/A 05/24/2015   Procedure: SAVORY DILATION;  Surgeon: Manya Silvas, MD;  Location: [facility] ENDOSCOPY;  Service: Endoscopy;  Laterality: N/A;    Family History No h/o GI disease or malignancy  Review of Systems  Constitutional:  Negative for activity change, appetite change, chills, fatigue, fever and unexpected weight change.  HENT:  Positive for trouble swallowing. Negative for voice change.   Respiratory:  Negative for shortness of breath.   Cardiovascular:  Negative for chest pain and palpitations.  Gastrointestinal:  Positive for abdominal pain (RLQ) and constipation. Negative for abdominal distention, anal bleeding, blood in stool, diarrhea, nausea and vomiting.  Musculoskeletal:  Negative for arthralgias and myalgias.  Skin:  Negative for color change and pallor.  Neurological:  Negative for dizziness, syncope and weakness.  Psychiatric/Behavioral:  Negative for confusion. The patient is not nervous/anxious.   All other systems reviewed and are negative.   Medications No current facility-administered medications on file prior to encounter.   Current Outpatient Medications on File Prior to Encounter  Medication Sig Dispense Refill   aspirin EC 81 MG tablet Take 81 mg by mouth daily. Swallow whole.     nitroGLYCERIN (NITROSTAT) 0.4 MG SL tablet Place 0.4  mg under the tongue every 5 (five) minutes as needed for chest pain.     DENTA 5000 PLUS 1.1 % CREA dental cream Place 1 g onto teeth daily.     ELIQUIS 5 MG TABS tablet TAKE 1 TABLET BY MOUTH EVERY 12 HOURS 180 tablet 1   lamoTRIgine  (LAMICTAL) 100 MG tablet Take 100 mg by mouth 2 (two) times daily.     lamoTRIgine (LAMICTAL) 25 MG tablet Take 25 mg by mouth 2 (two) times daily.     lisinopril (ZESTRIL) 30 MG tablet TAKE 1 TABLET BY MOUTH DAILY 30 tablet 3   Multiple Vitamin (MULTIVITAMIN) capsule Take by mouth.     omeprazole (PRILOSEC) 20 MG capsule TAKE 1 CAPSULE TWICE DAILY 180 capsule 2    Pertinent medications related to GI and procedure were reviewed by me with the patient prior to the procedure   Current Facility-Administered Medications:    0.9 %  sodium chloride infusion, , Intravenous, Continuous, Annamaria Helling, DO      Allergies  Allergen Reactions   Atorvastatin    Allergies were reviewed by me prior to the procedure  Objective    Vitals:   05/08/21 0756  BP: 133/76  Pulse: 88  Resp: 18  Temp: (!) 96.3 F (35.7 C)  TempSrc: Temporal  SpO2: 100%  Weight: 68 kg  Height: 5\' 8"  (1.727 m)     Physical Exam Vitals and nursing note reviewed.  Constitutional:      General: He is not in acute distress.    Appearance: Normal appearance. He is not ill-appearing, toxic-appearing or diaphoretic.  HENT:     Head: Normocephalic and atraumatic.     Nose: Nose normal.     Mouth/Throat:     Mouth: Mucous membranes are moist.     Pharynx: Oropharynx is clear.  Eyes:     General: No scleral icterus.    Extraocular Movements: Extraocular movements intact.  Cardiovascular:     Rate and Rhythm: Normal rate and regular rhythm.     Heart sounds: Normal heart sounds. No murmur heard.   No friction rub. No gallop.  Pulmonary:     Effort: Pulmonary effort is normal. No respiratory distress.     Breath sounds: Normal breath sounds. No wheezing, rhonchi or rales.  Abdominal:     General: Abdomen is flat. Bowel sounds are normal. There is no distension.     Palpations: Abdomen is soft.     Tenderness: There is no abdominal tenderness. There is no guarding or rebound.  Musculoskeletal:      Cervical back: Neck supple.     Right lower leg: No edema.     Left lower leg: No edema.  Skin:    General: Skin is warm and dry.     Coloration: Skin is not jaundiced or pale.  Neurological:     General: No focal deficit present.     Mental Status: He is alert and oriented to person, place, and time. Mental status is at baseline.  Psychiatric:        Mood and Affect: Mood normal.        Behavior: Behavior normal.        Thought Content: Thought content normal.        Judgment: Judgment normal.     Assessment:  Mr. DAINEL ARCIDIACONO is a 59 y.o. male  who presents today for Esophagogastroduodenoscopy and Colonoscopy for right lower quadrant pain, constipation and dysphagia.  Plan:  Esophagogastroduodenoscopy  and Colonoscopy with possible intervention today  Esophagogastroduodenoscopy and colonoscopy with possible biopsy, control of bleeding, polypectomy, and interventions as necessary has been discussed with the patient/patient representative. Informed consent was obtained from the patient/patient representative after explaining the indication, nature, and risks of the procedure including but not limited to death, bleeding, perforation, missed neoplasm/lesions, cardiorespiratory compromise, and reaction to medications. Opportunity for questions was given and appropriate answers were provided. Patient/patient representative has verbalized understanding is amenable to undergoing the procedure.   Annamaria Helling, DO  [facility]  Portions of the record may have been created with voice recognition software. Occasional wrong-word or 'sound-a-like' substitutions may have occurred due to the inherent limitations of voice recognition software.  Read the chart carefully and recognize, using context, where substitutions may have occurred.

## 2021-05-08 NOTE — Anesthesia Preprocedure Evaluation (Addendum)
Anesthesia Evaluation  Patient identified by MRN, date of birth, ID band Patient awake    Reviewed: Allergy & Precautions, NPO status , Patient's Chart, lab work & pertinent test results  History of Anesthesia Complications (+) history of anesthetic complications  Airway Mallampati: II  TM Distance: >3 FB Neck ROM: Full    Dental   Pulmonary former smoker,    Pulmonary exam normal        Cardiovascular hypertension, + Past MI (NSTEMI) and + Peripheral Vascular Disease  Normal cardiovascular exam     Neuro/Psych  Headaches, Seizures -, Well Controlled,   Neuromuscular disease (PN) negative psych ROS   GI/Hepatic Neg liver ROS, GERD  ,  Endo/Other  negative endocrine ROS  Renal/GU negative Renal ROS  negative genitourinary   Musculoskeletal  (+) Arthritis ,   Abdominal   Peds  Hematology  (+) anemia , Metastatic squamous cell cancer Tonsillar cancer DVT   Anesthesia Other Findings 2019 MAC 3 grade 2 view  EKG 12/2019 Sinus tach, PVCs  Cath 12/2019 Prox RCA to Mid RCA lesion is 45% stenosed. Prox Cx lesion is 60% stenosed with 75% stenosed side branch in 1st Mrg. Prox LAD to Mid LAD lesion is 65% stenosed. 1st Diag lesion is 55% stenosed. Medical management recommended  Echo 05/2019 1. Left ventricular ejection fraction, by visual estimation, is 60 to  65%. The left ventricle has normal function. Normal left ventricular size.  There is no left ventricular hypertrophy.  2. Global right ventricle has normal systolic function.The right  ventricular size is normal. No increase in right ventricular wall  thickness.  3. Left atrial size was normal.  4. Right atrial size was normal.  5. Moderate thickening of the mitral valve leaflet(s).  6. The mitral valve is normal in structure. Mild mitral valve  regurgitation. No evidence of mitral stenosis.  7. The tricuspid valve is normal in structure. Tricuspid  valve  regurgitation was not visualized by color flow Doppler.  8. The aortic valve is tricuspid Aortic valve regurgitation is trivial by  color flow Doppler. Mild to moderate aortic valve sclerosis/calcification  without any evidence of aortic stenosis.  9. The pulmonic valve was normal in structure. Pulmonic valve  regurgitation is not visualized by color flow Doppler.  10. Normal pulmonary artery systolic pressure.  11. The inferior vena cava is normal in size with greater than 50%  respiratory variability, suggesting right atrial pressure of 3 mmHg.   Reproductive/Obstetrics                            Anesthesia Physical Anesthesia Plan  ASA: 3  Anesthesia Plan: General   Post-op Pain Management:    Induction:   PONV Risk Score and Plan:   Airway Management Planned: Natural Airway  Additional Equipment:   Intra-op Plan:   Post-operative Plan:   Informed Consent: I have reviewed the patients History and Physical, chart, labs and discussed the procedure including the risks, benefits and alternatives for the proposed anesthesia with the patient or authorized representative who has indicated his/her understanding and acceptance.       Plan Discussed with: CRNA  Anesthesia Plan Comments: (K+ 5.2 yesterday, Cr 1.16)       Anesthesia Quick Evaluation

## 2021-05-08 NOTE — Transfer of Care (Signed)
Immediate Anesthesia Transfer of Care Note  Patient: Timothy Garrison  Procedure(s) Performed: COLONOSCOPY ESOPHAGOGASTRODUODENOSCOPY (EGD)  Patient Location: PACU  Anesthesia Type:General  Level of Consciousness: drowsy  Airway & Oxygen Therapy: Patient Spontanous Breathing  Post-op Assessment: Post -op Vital signs reviewed and stable  Post vital signs: Reviewed and stable  Last Vitals:  Vitals Value Taken Time  BP 132/62 05/08/21 0943  Temp    Pulse 65 05/08/21 0944  Resp 17 05/08/21 0944  SpO2 100 % 05/08/21 0944  Vitals shown include unvalidated device data.  Last Pain:  Vitals:   05/08/21 0756  TempSrc: Temporal  PainSc: 0-No pain         Complications: No notable events documented.

## 2021-05-08 NOTE — Op Note (Addendum)
[facility] Patient Name: Timothy Garrison Procedure Date: 05/08/2021 8:48 AM MRN: [id number] Account #: [id number] Date of Birth: 05/10/1961 Admit Type: Outpatient Age: 60 Room: [facility] ENDO ROOM 1 Gender: Male Note Status: Finalized Instrument Name: Upper Endoscope [id number] Procedure:             Upper GI endoscopy Indications:           Dysphagia Providers:             Annamaria Helling DO, DO Referring MD:          Einar Pheasant, MD (Referring MD) Medicines:             Monitored Anesthesia Care Complications:         No immediate complications. Estimated blood loss:                         Minimal. Procedure:             Pre-Anesthesia Assessment:                        - Prior to the procedure, a History and Physical was                         performed, and patient medications and allergies were                         reviewed. The patient is competent. The risks and                         benefits of the procedure and the sedation options and                         risks were discussed with the patient. All questions                         were answered and informed consent was obtained.                         Patient identification and proposed procedure were                         verified by the physician, the nurse, the anesthetist                         and the technician in the endoscopy suite. Mental                         Status Examination: alert and oriented. Airway                         Examination: normal oropharyngeal airway and neck                         mobility. Respiratory Examination: clear to                         auscultation. CV Examination: RRR, no murmurs, no S3  or S4. Prophylactic Antibiotics: The patient does not                         require prophylactic antibiotics. Prior                         Anticoagulants: The patient has taken Eliquis                          (apixaban), last dose was 2 days prior to procedure.                         ASA Grade Assessment: III - A patient with severe                         systemic disease. After reviewing the risks and                         benefits, the patient was deemed in satisfactory                         condition to undergo the procedure. The anesthesia                         plan was to use monitored anesthesia care (MAC).                         Immediately prior to administration of medications,                         the patient was re-assessed for adequacy to receive                         sedatives. The heart rate, respiratory rate, oxygen                         saturations, blood pressure, adequacy of pulmonary                         ventilation, and response to care were monitored                         throughout the procedure. The physical status of the                         patient was re-assessed after the procedure.                        After obtaining informed consent, the endoscope was                         passed under direct vision. Throughout the procedure,                         the patient's blood pressure, pulse, and oxygen                         saturations were monitored continuously. The Endoscope  was introduced through the mouth, and advanced to the                         second part of duodenum. The upper GI endoscopy was                         accomplished without difficulty. The patient tolerated                         the procedure well. Findings:      The duodenal bulb, first portion of the duodenum and second portion of       the duodenum were normal. Estimated blood loss was minimal.      The entire examined stomach was normal. Biopsies were taken with a cold       forceps for Helicobacter pylori testing. Estimated blood loss was       minimal.      The Z-line was regular and was found 40 cm from the incisors.       Esophagogastric landmarks were identified: the gastroesophageal junction       was found at 40 cm from the incisors.      Normal mucosa was found in the entire esophagus. Biopsies were taken       with a cold forceps for histology. Estimated blood loss was minimal. A       guidewire was placed and the scope was withdrawn. Dilation was performed       with a Savary dilator with no resistance at 51 Fr. The dilation site was       examined following endoscope reinsertion and showed no change. Estimated       blood loss: none.      A small non-bleeding diverticulum was found in the second portion of the       duodenum. Impression:            - Normal duodenal bulb, first portion of the duodenum                         and second portion of the duodenum.                        - Normal stomach. Biopsied.                        - Z-line regular, 40 cm from the incisors.                        - Esophagogastric landmarks identified.                        - Normal mucosa was found in the entire esophagus.                         Biopsied. Dilated.                        - Non-bleeding duodenal diverticulum. Recommendation:        - Discharge patient to home.                        - Soft diet today.                        -  Continue present medications.                        - Await pathology results.                        - Return to referring physician as previously                         scheduled. Procedure Code(s):     --- Professional ---                        [phone], Esophagogastroduodenoscopy, flexible,                         transoral; with insertion of guide wire followed by                         passage of dilator(s) through esophagus over guide wire                        [id number], 59,51, Esophagogastroduodenoscopy, flexible,                         transoral; with biopsy, single or multiple Diagnosis Code(s):     --- Professional ---                        R13.10, Dysphagia,  unspecified CPT copyright 2019 [employer]. All rights reserved. The codes documented in this report are preliminary and upon coder review may  be revised to meet current compliance requirements. Attending Participation:      I personally performed the entire procedure. Volney American, DO Annamaria Helling DO, DO 05/08/2021 9:46:07 AM This report has been signed electronically. Number of Addenda: 0 Note Initiated On: 05/08/2021 8:48 AM Estimated Blood Loss:  Estimated blood loss was minimal.      [facility]

## 2021-05-08 NOTE — Interval H&P Note (Signed)
History and Physical Interval Note: Preprocedure H&P from 05/08/21  was reviewed and there was no interval change after seeing and examining the patient.  Written consent was obtained from the patient after discussion of risks, benefits, and alternatives. Patient has consented to proceed with Esophagogastroduodenoscopy and Colonoscopy with possible intervention   05/08/2021 8:03 AM  Timothy Garrison  has presented today for surgery, with the diagnosis of dysphagia change in [facility].  The various methods of treatment have been discussed with the patient and family. After consideration of risks, benefits and other options for treatment, the patient has consented to  Procedure(s): COLONOSCOPY (N/A) ESOPHAGOGASTRODUODENOSCOPY (EGD) (N/A) as a surgical intervention.  The patient's history has been reviewed, patient examined, no change in status, stable for surgery.  I have reviewed the patient's chart and labs.  Questions were answered to the patient's satisfaction.     Annamaria Helling

## 2021-05-08 NOTE — Op Note (Signed)
[facility] Gastroenterology Patient Name: Timothy Garrison Procedure Date: 05/08/2021 8:48 AM MRN: [id number] Account #: [id number] Date of Birth: 06/28/1961 Admit Type: Outpatient Age: 60 Room: [facility] ENDO ROOM 1 Gender: Male Note Status: Finalized Instrument Name: Jasper Riling [id number] Procedure:             Colonoscopy Indications:           Abdominal pain in the right lower quadrant, Change in                         bowel habits, Constipation Providers:             Rueben Bash, DO Referring MD:          Einar Pheasant, MD (Referring MD) Medicines:             Monitored Anesthesia Care Complications:         No immediate complications. Estimated blood loss: None. Procedure:             Pre-Anesthesia Assessment:                        - Prior to the procedure, a History and Physical was                         performed, and patient medications and allergies were                         reviewed. The patient is competent. The risks and                         benefits of the procedure and the sedation options and                         risks were discussed with the patient. All questions                         were answered and informed consent was obtained.                         Patient identification and proposed procedure were                         verified by the physician, the nurse, the anesthetist                         and the technician in the endoscopy suite. Mental                         Status Examination: alert and oriented. Airway                         Examination: normal oropharyngeal airway and neck                         mobility. Respiratory Examination: clear to                         auscultation. CV Examination: RRR, no murmurs, no S3  or S4. Prophylactic Antibiotics: The patient does not                         require prophylactic antibiotics. Prior                         Anticoagulants: The  patient has taken Eliquis                         (apixaban), last dose was 2 days prior to procedure.                         ASA Grade Assessment: III - A patient with severe                         systemic disease. After reviewing the risks and                         benefits, the patient was deemed in satisfactory                         condition to undergo the procedure. The anesthesia                         plan was to use monitored anesthesia care (MAC).                         Immediately prior to administration of medications,                         the patient was re-assessed for adequacy to receive                         sedatives. The heart rate, respiratory rate, oxygen                         saturations, blood pressure, adequacy of pulmonary                         ventilation, and response to care were monitored                         throughout the procedure. The physical status of the                         patient was re-assessed after the procedure.                        After obtaining informed consent, the colonoscope was                         passed under direct vision. Throughout the procedure,                         the patient's blood pressure, pulse, and oxygen                         saturations were monitored continuously. The  Colonoscope was introduced through the anus and                         advanced to the the terminal ileum, with                         identification of the appendiceal orifice and IC                         valve. The colonoscopy was performed without                         difficulty. The patient tolerated the procedure well.                         The quality of the bowel preparation was evaluated                         using the BBPS [facility] Bowel Preparation Scale) with                         scores of: Right Colon = 3, Transverse Colon = 3 and                         Left Colon = 3 (entire  mucosa seen well with no                         residual staining, small fragments of stool or opaque                         liquid). The total BBPS score equals 9. The terminal                         ileum, ileocecal valve, appendiceal orifice, and                         rectum were photographed. Findings:      The perianal and digital rectal examinations were normal. Pertinent       negatives include normal sphincter tone.      The terminal ileum appeared normal. Estimated blood loss: none.      Non-bleeding internal hemorrhoids were found during retroflexion. The       hemorrhoids were Grade I (internal hemorrhoids that do not prolapse).      The exam was otherwise without abnormality on direct and retroflexion       views. Impression:            - The examined portion of the ileum was normal.                        - Non-bleeding internal hemorrhoids.                        - The examination was otherwise normal on direct and                         retroflexion views.                        -  No specimens collected. Recommendation:        - Discharge patient to home.                        - Resume previous diet.                        - Resume Eliquis (apixaban) at prior dose tomorrow.                         Refer to referring physician for further adjustment of                         therapy.                        - Repeat colonoscopy in 10 years for surveillance                         based on pathology results.                        - Return to referring physician as previously                         scheduled. Procedure Code(s):     --- Professional ---                        [phone], Colonoscopy, flexible; diagnostic, including                         collection of specimen(s) by brushing or washing, when                         performed (separate procedure) Diagnosis Code(s):     --- Professional ---                        K64.0, First degree hemorrhoids                         R10.31, Right lower quadrant pain                        R19.4, Change in bowel habit                        K59.00, Constipation, unspecified CPT copyright 2019 American Medical Association. All rights reserved. The codes documented in this report are preliminary and upon coder review may  be revised to meet current compliance requirements. Attending Participation:      I personally performed the entire procedure. Volney American, DO Annamaria Helling DO, DO 05/08/2021 9:50:08 AM This report has been signed electronically. Number of Addenda: 0 Note Initiated On: 05/08/2021 8:48 AM Scope Withdrawal Time: 0 hours 12 minutes 15 seconds  Total Procedure Duration: 0 hours 24 minutes 49 seconds  Estimated Blood Loss:  Estimated blood loss: none.      [facility]

## 2021-05-08 NOTE — Anesthesia Postprocedure Evaluation (Signed)
Anesthesia Post Note  Patient: Timothy Garrison  Procedure(s) Performed: COLONOSCOPY ESOPHAGOGASTRODUODENOSCOPY (EGD)  Patient location during evaluation: PACU Anesthesia Type: General Level of consciousness: awake and alert Pain management: pain level controlled Vital Signs Assessment: post-procedure vital signs reviewed and stable Respiratory status: spontaneous breathing, nonlabored ventilation, respiratory function stable and patient connected to nasal cannula oxygen Cardiovascular status: blood pressure returned to baseline and stable Postop Assessment: no apparent nausea or vomiting Anesthetic complications: no   No notable events documented.   Last Vitals:  Vitals:   05/08/21 0943 05/08/21 1003  BP: 132/62 (!) 141/88  Pulse: 64   Resp: 18   Temp: (!) 36.3 C   SpO2: 100%     Last Pain:  Vitals:   05/08/21 1003  TempSrc:   PainSc: 0-No pain                 [place]

## 2021-05-09 ENCOUNTER — Ambulatory Visit: Payer: Medicare Other

## 2021-05-09 ENCOUNTER — Encounter: Payer: Self-pay | Admitting: Gastroenterology

## 2021-05-09 LAB — SURGICAL PATHOLOGY

## 2021-05-11 ENCOUNTER — Encounter: Payer: Self-pay | Admitting: Internal Medicine

## 2021-05-11 NOTE — Assessment & Plan Note (Signed)
S/p fem fem bypass.  Followed by [facility].  On eliquis.  Continue pravastatin.  Tolerating 80mg dose.   

## 2021-05-11 NOTE — Assessment & Plan Note (Signed)
S/p XRT and chemo.  Followed by oncology.  

## 2021-05-11 NOTE — Assessment & Plan Note (Signed)
No upper symptoms related.  On prilosec.  

## 2021-05-11 NOTE — Assessment & Plan Note (Signed)
Continue pravastatin.  Low cholesterol diet and exercise.  Follow lipid panel and liver function tests.   

## 2021-05-11 NOTE — Assessment & Plan Note (Signed)
Continue lisinopril.  Follow pressures.  Follow metabolic panel. Discussed elevated blood pressure.  Desires no adjustment in medication.  Follow.

## 2021-05-11 NOTE — Assessment & Plan Note (Signed)
Seeing neurosurgery.   MRI -   multilevel degenerative changes most concerning for facet arthropathy. PT.  Discussed possible pain clinic referral.

## 2021-05-11 NOTE — Assessment & Plan Note (Signed)
Follow cbc.  

## 2021-05-11 NOTE — Assessment & Plan Note (Signed)
Continues on lamictal.  No recent seizures.  Follow.  

## 2021-05-16 ENCOUNTER — Other Ambulatory Visit: Payer: Self-pay | Admitting: *Deleted

## 2021-05-16 DIAGNOSIS — E875 Hyperkalemia: Secondary | ICD-10-CM

## 2021-05-19 ENCOUNTER — Other Ambulatory Visit: Payer: Self-pay

## 2021-05-19 ENCOUNTER — Other Ambulatory Visit (INDEPENDENT_AMBULATORY_CARE_PROVIDER_SITE_OTHER): Payer: Medicare Other

## 2021-05-19 DIAGNOSIS — E875 Hyperkalemia: Secondary | ICD-10-CM

## 2021-05-19 LAB — POTASSIUM: Potassium: 4.9 mEq/L (ref 3.5–5.1)

## 2021-05-22 DIAGNOSIS — Z95828 Presence of other vascular implants and grafts: Secondary | ICD-10-CM | POA: Diagnosis not present

## 2021-05-22 DIAGNOSIS — I745 Embolism and thrombosis of iliac artery: Secondary | ICD-10-CM | POA: Diagnosis not present

## 2021-05-22 DIAGNOSIS — I6523 Occlusion and stenosis of bilateral carotid arteries: Secondary | ICD-10-CM | POA: Diagnosis not present

## 2021-05-22 DIAGNOSIS — G6289 Other specified polyneuropathies: Secondary | ICD-10-CM | POA: Diagnosis not present

## 2021-05-22 DIAGNOSIS — I739 Peripheral vascular disease, unspecified: Secondary | ICD-10-CM | POA: Diagnosis not present

## 2021-05-22 DIAGNOSIS — Z923 Personal history of irradiation: Secondary | ICD-10-CM | POA: Diagnosis not present

## 2021-06-02 ENCOUNTER — Other Ambulatory Visit: Payer: Self-pay | Admitting: Internal Medicine

## 2021-06-20 ENCOUNTER — Ambulatory Visit (INDEPENDENT_AMBULATORY_CARE_PROVIDER_SITE_OTHER): Payer: Medicare Other

## 2021-06-20 VITALS — Ht 68.0 in | Wt 150.0 lb

## 2021-06-20 DIAGNOSIS — Z0000 Encounter for general adult medical examination without abnormal findings: Secondary | ICD-10-CM | POA: Diagnosis not present

## 2021-06-20 DIAGNOSIS — I6523 Occlusion and stenosis of bilateral carotid arteries: Secondary | ICD-10-CM | POA: Diagnosis not present

## 2021-06-20 NOTE — Patient Instructions (Addendum)
Mr. Timothy Garrison , Thank you for taking time to come for your Medicare Wellness Visit. I appreciate your ongoing commitment to your health goals. Please review the following plan we discussed and let me know if I can assist you in the future.   These are the goals we discussed:  Goals       Patient Stated     Lower cholesterol (pt-stated)      Healthy diet Stay active        This is a list of the screening recommended for you and due dates:  Health Maintenance  Topic Date Due   Zoster (Shingles) Vaccine (1 of 2) 09/20/2021*   Flu Shot  11/14/2021*   COVID-19 Vaccine (3 - Pfizer risk series) 02/14/2022*   Pneumococcal Vaccination (1 - PCV) 02/24/2022*   Tetanus Vaccine  06/20/2022*   Colon Cancer Screening  05/09/2031   Hepatitis C Screening: USPSTF Recommendation to screen - Ages 18-79 yo.  Completed   HIV Screening  Completed   HPV Vaccine  Aged Out  *Topic was postponed. The date shown is not the original due date.    Advanced directives: End of life planning; Advance aging; Advanced directives discussed.  Copy of current HCPOA/Living Will requested.    Conditions/risks identified: none new  Follow up in one year for your annual wellness visit   Preventive Care 40-64 Years, Male Preventive care refers to lifestyle choices and visits with your health care provider that can promote health and wellness. What does preventive care include? A yearly physical exam. This is also called an annual well check. Dental exams once or twice a year. Routine eye exams. Ask your health care provider how often you should have your eyes checked. Personal lifestyle choices, including: Daily care of your teeth and gums. Regular physical activity. Eating a healthy diet. Avoiding tobacco and drug use. Limiting alcohol use. Practicing safe sex. Taking low-dose aspirin every day starting at age 23. What happens during an annual well check? The services and screenings done by your health care  provider during your annual well check will depend on your age, overall health, lifestyle risk factors, and family history of disease. Counseling  Your health care provider may ask you questions about your: Alcohol use. Tobacco use. Drug use. Emotional well-being. Home and relationship well-being. Sexual activity. Eating habits. Work and work Statistician. Screening  You may have the following tests or measurements: Height, weight, and BMI. Blood pressure. Lipid and cholesterol levels. These may be checked every 5 years, or more frequently if you are over 2 years old. Skin check. Lung cancer screening. You may have this screening every year starting at age 23 if you have a 30-pack-year history of smoking and currently smoke or have quit within the past 15 years. Fecal occult blood test (FOBT) of the stool. You may have this test every year starting at age 59. Flexible sigmoidoscopy or colonoscopy. You may have a sigmoidoscopy every 5 years or a colonoscopy every 10 years starting at age 43. Prostate cancer screening. Recommendations will vary depending on your family history and other risks. Hepatitis C blood test. Hepatitis B blood test. Sexually transmitted disease (STD) testing. Diabetes screening. This is done by checking your blood sugar (glucose) after you have not eaten for a while (fasting). You may have this done every 1-3 years. Discuss your test results, treatment options, and if necessary, the need for more tests with your health care provider. Vaccines  Your health care provider may recommend certain  vaccines, such as: Influenza vaccine. This is recommended every year. Tetanus, diphtheria, and acellular pertussis (Tdap, Td) vaccine. You may need a Td booster every 10 years. Zoster vaccine. You may need this after age 26. Pneumococcal 13-valent conjugate (PCV13) vaccine. You may need this if you have certain conditions and have not been vaccinated. Pneumococcal  polysaccharide (PPSV23) vaccine. You may need one or two doses if you smoke cigarettes or if you have certain conditions. Talk to your health care provider about which screenings and vaccines you need and how often you need them. This information is not intended to replace advice given to you by your health care provider. Make sure you discuss any questions you have with your health care provider. Document Released: 08/30/2015 Document Revised: 04/22/2016 Document Reviewed: 06/04/2015 Elsevier Interactive Patient Education  2017 [place] Prevention in the Home Falls can cause injuries. They can happen to people of all ages. There are many things you can do to make your home safe and to help prevent falls. What can I do on the outside of my home? Regularly fix the edges of walkways and driveways and fix any cracks. Remove anything that might make you trip as you walk through a door, such as a raised step or threshold. Trim any bushes or trees on the path to your home. Use bright outdoor lighting. Clear any walking paths of anything that might make someone trip, such as rocks or tools. Regularly check to see if handrails are loose or broken. Make sure that both sides of any steps have handrails. Any raised decks and porches should have guardrails on the edges. Have any leaves, snow, or ice cleared regularly. Use sand or salt on walking paths during winter. Clean up any spills in your garage right away. This includes oil or grease spills. What can I do in the bathroom? Use night lights. Install grab bars by the toilet and in the tub and shower. Do not use towel bars as grab bars. Use non-skid mats or decals in the tub or shower. If you need to sit down in the shower, use a plastic, non-slip stool. Keep the floor dry. Clean up any water that spills on the floor as soon as it happens. Remove soap buildup in the tub or shower regularly. Attach bath mats securely with double-sided  non-slip rug tape. Do not have throw rugs and other things on the floor that can make you trip. What can I do in the bedroom? Use night lights. Make sure that you have a light by your bed that is easy to reach. Do not use any sheets or blankets that are too big for your bed. They should not hang down onto the floor. Have a firm chair that has side arms. You can use this for support while you get dressed. Do not have throw rugs and other things on the floor that can make you trip. What can I do in the kitchen? Clean up any spills right away. Avoid walking on wet floors. Keep items that you use a lot in easy-to-reach places. If you need to reach something above you, use a strong step stool that has a grab bar. Keep electrical cords out of the way. Do not use floor polish or wax that makes floors slippery. If you must use wax, use non-skid floor wax. Do not have throw rugs and other things on the floor that can make you trip. What can I do with my stairs? Do not leave  any items on the stairs. Make sure that there are handrails on both sides of the stairs and use them. Fix handrails that are broken or loose. Make sure that handrails are as long as the stairways. Check any carpeting to make sure that it is firmly attached to the stairs. Fix any carpet that is loose or worn. Avoid having throw rugs at the top or bottom of the stairs. If you do have throw rugs, attach them to the floor with carpet tape. Make sure that you have a light switch at the top of the stairs and the bottom of the stairs. If you do not have them, ask someone to add them for you. What else can I do to help prevent falls? Wear shoes that: Do not have high heels. Have rubber bottoms. Are comfortable and fit you well. Are closed at the toe. Do not wear sandals. If you use a stepladder: Make sure that it is fully opened. Do not climb a closed stepladder. Make sure that both sides of the stepladder are locked into place. Ask  someone to hold it for you, if possible. Clearly mark and make sure that you can see: Any grab bars or handrails. First and last steps. Where the edge of each step is. Use tools that help you move around (mobility aids) if they are needed. These include: Canes. Walkers. Scooters. Crutches. Turn on the lights when you go into a dark area. Replace any light bulbs as soon as they burn out. Set up your furniture so you have a clear path. Avoid moving your furniture around. If any of your floors are uneven, fix them. If there are any pets around you, be aware of where they are. Review your medicines with your doctor. Some medicines can make you feel dizzy. This can increase your chance of falling. Ask your doctor what other things that you can do to help prevent falls. This information is not intended to replace advice given to you by your health care provider. Make sure you discuss any questions you have with your health care provider. Document Released: 05/30/2009 Document Revised: 01/09/2016 Document Reviewed: 09/07/2014 Elsevier Interactive Patient Education  2017 [employer].

## 2021-06-20 NOTE — Progress Notes (Signed)
Subjective:   Timothy Garrison is a 60 y.o. male who presents for Medicare Annual/Subsequent preventive examination.  Review of Systems    No ROS.  Medicare Wellness Virtual Visit.  Visual/audio telehealth visit, UTA vital signs.   See social history for additional risk factors.   Cardiac Risk Factors include: advanced age (>48men, >5 women)     Objective:    Today's Vitals   06/20/21 0904  Weight: 150 lb (68 kg)  Height: 5\' 8"  (1.727 m)   Body mass index is 22.81 kg/m.  Advanced Directives 06/20/2021 05/08/2021 12/24/2019 12/23/2019 10/30/2019 08/04/2019 05/23/2019  Does Patient Have a Medical Advance Directive? Yes Yes No;Yes No No Yes Yes  Type of Paramedic of [place];Living will [place];Living will Living will - - Living will Living will  Does patient want to make changes to medical advance directive? No - Patient declined - No - Patient declined - - No - Patient declined No - Patient declined  Copy of [place] in Chart? No - copy requested - - - - No - copy requested -  Would patient like information on creating a medical advance directive? - - No - Patient declined No - Patient declined No - Patient declined No - Patient declined -    Current Medications (verified) Outpatient Encounter Medications as of 06/20/2021  Medication Sig   aspirin EC 81 MG tablet Take 81 mg by mouth daily. Swallow whole.   ELIQUIS 5 MG TABS tablet TAKE 1 TABLET BY MOUTH EVERY 12 HOURS   lamoTRIgine (LAMICTAL) 100 MG tablet Take 100 mg by mouth 2 (two) times daily.   lamoTRIgine (LAMICTAL) 25 MG tablet Take 25 mg by mouth 2 (two) times daily.   lisinopril (ZESTRIL) 30 MG tablet TAKE 1 TABLET BY MOUTH DAILY   Multiple Vitamin (MULTIVITAMIN) capsule Take by mouth.   nitroGLYCERIN (NITROSTAT) 0.4 MG SL tablet Place 0.4 mg under the tongue every 5 (five) minutes as needed for chest pain.   omeprazole (PRILOSEC) 20 MG capsule TAKE 1 CAPSULE  BY MOUTH TWICE DAILY   pravastatin (PRAVACHOL) 80 MG tablet Take 1 tablet (80 mg total) by mouth at bedtime.   No facility-administered encounter medications on file as of 06/20/2021.    Allergies (verified) Atorvastatin   History: Past Medical History:  Diagnosis Date   Basal cell carcinoma (BCC) of back    Basal cell carcinoma (BCC) of nasal sidewall    Dizziness    GERD (gastroesophageal reflux disease)    H/O blood clots    Headache    migraines   Hypercholesterolemia    Hypertension    hx of HBP readings   Metastatic squamous cell carcinoma 2018   bx cervical lymph node   Seizures ([place]) 2021   Tonsillar cancer (H[facility])    Vascular abnormality    VTE (venous thromboembolism) 2018   associated with chemotherapy   Past Surgical History:  Procedure Laterality Date   COLONOSCOPY     COLONOSCOPY N/A 05/08/2021   Procedure: COLONOSCOPY;  Surgeon: Annamaria Helling, DO;  Location: [facility] ENDOSCOPY;  Service: Gastroenterology;  Laterality: N/A;   endovascular balloon angioplasty femoral/popliteal artery w/stent Left 11/08/2017   ESOPHAGOGASTRODUODENOSCOPY N/A 05/08/2021   Procedure: ESOPHAGOGASTRODUODENOSCOPY (EGD);  Surgeon: Annamaria Helling, DO;  Location[facility] ENDOSCOPY;  Service: Gastroenterology;  Laterality: N/A;   ESOPHAGOGASTRODUODENOSCOPY (EGD) WITH PROPOFOL N/A 05/24/2015   Procedure: ESOPHAGOGASTRODUODENOSCOPY (EGD) WITH PROPOFOL;  Surgeon: Manya Silvas, MD;  Location: [facility]  ENDOSCOPY;  Service: Endoscopy;  Laterality: N/A;   FEMORAL-FEMORAL BYPASS GRAFT     IR IMAGING GUIDED PORT INSERTION  12/09/2016   LEFT HEART CATH AND CORONARY ANGIOGRAPHY N/A 12/25/2019   Procedure: LEFT HEART CATH AND CORONARY ANGIOGRAPHY;  Surgeon: Corey Skains, MD;  Location: [place] CV LAB;  Service: Cardiovascular;  Laterality: N/A;   SAVORY DILATION N/A 05/24/2015   Procedure: SAVORY DILATION;  Surgeon: Manya Silvas, MD;  Location: [facility] ENDOSCOPY;  Service: Endoscopy;   Laterality: N/A;   Family History  Problem Relation Age of Onset   Hyperlipidemia Mother    Heart disease Mother    Hypertension Mother    Alzheimer's disease Mother    Prostate cancer Father    Stroke Father    Social History   Socioeconomic History   Marital status: Married    Spouse name: Not on file   Number of children: Not on file   Years of education: Not on file   Highest education level: Not on file  Occupational History   Occupation: Dealer  Tobacco Use   Smoking status: Former    Types: Cigarettes    Quit date: 11/11/1996    Years since quitting: 24.6   Smokeless tobacco: Never  Vaping Use   Vaping Use: Never used  Substance and Sexual Activity   Alcohol use: Yes    Comment: occasional   Drug use: No   Sexual activity: Yes  Other Topics Concern   Not on file  Social History Narrative   Not on file   Social Determinants of Health   Financial Resource Strain: Low Risk    Difficulty of Paying Living Expenses: Not hard at all  Food Insecurity: Not on file  Transportation Needs: No Transportation Needs   Lack of Transportation (Medical): No   Lack of Transportation (Non-Medical): No  Physical Activity: Insufficiently Active   Days of Exercise per Week: 4 days   Minutes of Exercise per Session: 20 min  Stress: No Stress Concern Present   Feeling of Stress : Not at all  Social Connections: Unknown   Frequency of Communication with Friends and Family: More than three times a week   Frequency of Social Gatherings with Friends and Family: More than three times a week   Attends Religious Services: More than 4 times per year   Active Member of [employer] or Organizations: Not on file   Attends Archivist Meetings: Not on file   Marital Status: Married    Tobacco Counseling Counseling given: Not Answered   Clinical Intake:  Pre-visit preparation completed: Yes        Diabetes: No  How often do you need to have someone help you when you  read instructions, pamphlets, or other written materials from your doctor or pharmacy?: 1 - Never   Interpreter Needed?: No      Activities of Daily Living In your present state of health, do you have any difficulty performing the following activities: 06/20/2021  Hearing? N  Vision? N  Difficulty concentrating or making decisions? N  Walking or climbing stairs? N  Comment Paces self when walking. Breaks when necessary.  Dressing or bathing? N  Doing errands, shopping? N  Preparing Food and eating ? N  Using the Toilet? N  In the past six months, have you accidently leaked urine? N  Do you have problems with loss of bowel control? N  Managing your Medications? N  Managing your Finances? N  Housekeeping or managing  your Housekeeping? N  Some recent data might be hidden    Patient Care Team: Einar Pheasant, MD as PCP - General (Internal Medicine)  Indicate any recent Medical Services you may have received from other than [facility] providers in the past year (date may be approximate).     Assessment:   This is a routine wellness examination for Shishir.  Virtual Visit via Telephone Note  I connected with  Virginia Rochester on 06/20/21 at  9:00 AM EDT by telephone and verified that I am speaking with the correct person using two identifiers.  Location: Patient: home Provider: office Persons participating in the virtual visit: patient/Nurse Health Advisor   I discussed the limitations, risks, security and privacy concerns of performing an evaluation and management service by telephone and the availability of in person appointments. The patient expressed understanding and agreed to proceed.  Interactive audio and video telecommunications were attempted between this nurse and patient, however failed, due to patient having technical difficulties OR patient did not have access to video capability.  We continued and completed visit with audio only.  Some vital signs may be absent or  patient reported.   OBrien-Blaney, Tieshia Rettinger L, LPN   Hearing/Vision screen Hearing Screening - Comments:: Patient is able to hear conversational tones without difficulty.  No issues reported. Vision Screening - Comments:: Followed by Dr. Matilde Sprang  Wears reader glasses  They have seen their ophthalmologist in the last 12 months.   Dietary issues and exercise activities discussed: Current Exercise Habits: Home exercise routine, Type of exercise: calisthenics (recumbent bike), Time (Minutes): 25, Frequency (Times/Week): 4, Weekly Exercise (Minutes/Week): 100, Intensity: Mild Lean meat  Good water intake   Goals Addressed               This Visit's Progress     Patient Stated     Lower cholesterol (pt-stated)   On track     Healthy diet Stay active       Depression Screen PHQ 2/9 Scores 06/20/2021 05/08/2020 04/25/2019 12/08/2016 07/02/2016 04/18/2015 09/12/2013  PHQ - 2 Score 0 0 0 0 0 0 1    Fall Risk Fall Risk  06/20/2021 02/24/2021 05/08/2020 04/25/2019 03/02/2017  Falls in the past year? 0 0 0 0 No  Number falls in past yr: 0 0 0 - -  Injury with Fall? - 0 - - -  Follow up Falls evaluation completed Falls evaluation completed Falls evaluation completed Falls evaluation completed -    FALL RISK PREVENTION PERTAINING TO THE HOME: Any stairs in or around the home? Yes  If so, are there any without handrails? No  Home free of loose throw rugs in walkways, pet beds, electrical cords, etc? Yes  Adequate lighting in your home to reduce risk of falls? Yes   ASSISTIVE DEVICES UTILIZED TO PREVENT FALLS: Life alert? No  Use of a cane, walker or w/c? Yes , as needed Grab bars in the bathroom? Yes  Shower chair or bench in shower? Yes  Elevated toilet seat or a handicapped toilet? No   TIMED UP AND GO: Was the test performed? No .   Cognitive Function:  Patient is alert and oriented x3.    6CIT Screen 06/20/2021 05/08/2020  What Year? 0 points 0 points  What month? 0 points 0 points   What time? 0 points -  Count back from 20 0 points 0 points  Months in reverse 0 points 0 points  Repeat phrase 0 points 0 points  Total  Score 0 -    Immunizations Immunization History  Administered Date(s) Administered   Influenza-Unspecified 06/17/2018   PFIZER(Purple Top)SARS-COV-2 Vaccination 04/23/2020, 05/14/2020   TDAP status: Due, Education has been provided regarding the importance of this vaccine. Advised may receive this vaccine at local pharmacy or Health Dept. Aware to provide a copy of the vaccination record if obtained from local pharmacy or Health Dept. Verbalized acceptance and understanding. Deferred.   Shingrix Completed?: No.    Education has been provided regarding the importance of this vaccine. Patient has been advised to call insurance company to determine out of pocket expense if they have not yet received this vaccine. Advised may also receive vaccine at local pharmacy or Health Dept. Verbalized acceptance and understanding.  Screening Tests Health Maintenance  Topic Date Due   Zoster Vaccines- Shingrix (1 of 2) 09/20/2021 (Originally 07/02/1979)   INFLUENZA VACCINE  11/14/2021 (Originally 03/17/2021)   COVID-19 Vaccine (3 - Pfizer risk series) 02/14/2022 (Originally 06/11/2020)   Pneumococcal Vaccine 73-51 Years old (1 - PCV) 02/24/2022 (Originally 07/01/1966)   TETANUS/TDAP  06/20/2022 (Originally 07/02/1979)   COLONOSCOPY (Pts 45-64yrs Insurance coverage will need to be confirmed)  05/09/2031   Hepatitis C Screening  Completed   HIV Screening  Completed   HPV VACCINES  Aged Out    Health Maintenance  There are no preventive care reminders to display for this patient.  Lung Cancer Screening: (Low Dose CT Chest recommended if Age 80-80 years, 30 pack-year currently smoking OR have quit w/in 15years.) does not qualify.   Vision Screening: Recommended annual ophthalmology exams for early detection of glaucoma and other disorders of the eye. Is the  patient up to date with their annual eye exam?  Yes   Dental Screening: Recommended annual dental exams for proper oral hygiene  Community Resource Referral / Chronic Care Management: CRR required this visit?  No   CCM required this visit?  No      Plan:   Keep all routine maintenance appointments.   I have personally reviewed and noted the following in the patient's chart:   Medical and social history Use of alcohol, tobacco or illicit drugs  Current medications and supplements including opioid prescriptions. Patient is not currently taking opioid prescriptions. Functional ability and status Nutritional status Physical activity Advanced directives List of other physicians Hospitalizations, surgeries, and ER visits in previous 12 months Vitals Screenings to include cognitive, depression, and falls Referrals and appointments  In addition, I have reviewed and discussed with patient certain preventive protocols, quality metrics, and best practice recommendations. A written personalized care plan for preventive services as well as general preventive health recommendations were provided to patient.     Varney Biles, LPN   [id number]/09/[id number]

## 2021-06-25 DIAGNOSIS — L821 Other seborrheic keratosis: Secondary | ICD-10-CM | POA: Diagnosis not present

## 2021-06-25 DIAGNOSIS — L578 Other skin changes due to chronic exposure to nonionizing radiation: Secondary | ICD-10-CM | POA: Diagnosis not present

## 2021-06-25 DIAGNOSIS — D0339 Melanoma in situ of other parts of face: Secondary | ICD-10-CM | POA: Diagnosis not present

## 2021-06-25 DIAGNOSIS — L570 Actinic keratosis: Secondary | ICD-10-CM | POA: Diagnosis not present

## 2021-06-25 DIAGNOSIS — D225 Melanocytic nevi of trunk: Secondary | ICD-10-CM | POA: Diagnosis not present

## 2021-06-25 DIAGNOSIS — L738 Other specified follicular disorders: Secondary | ICD-10-CM | POA: Diagnosis not present

## 2021-06-25 DIAGNOSIS — Z85820 Personal history of malignant melanoma of skin: Secondary | ICD-10-CM | POA: Diagnosis not present

## 2021-06-25 DIAGNOSIS — D239 Other benign neoplasm of skin, unspecified: Secondary | ICD-10-CM | POA: Diagnosis not present

## 2021-06-25 DIAGNOSIS — Z85828 Personal history of other malignant neoplasm of skin: Secondary | ICD-10-CM | POA: Diagnosis not present

## 2021-06-25 DIAGNOSIS — D485 Neoplasm of uncertain behavior of skin: Secondary | ICD-10-CM | POA: Diagnosis not present

## 2021-06-25 DIAGNOSIS — C441121 Basal cell carcinoma of skin of right upper eyelid, including canthus: Secondary | ICD-10-CM | POA: Diagnosis not present

## 2021-06-25 DIAGNOSIS — Z859 Personal history of malignant neoplasm, unspecified: Secondary | ICD-10-CM | POA: Diagnosis not present

## 2021-07-03 ENCOUNTER — Other Ambulatory Visit: Payer: Self-pay | Admitting: Internal Medicine

## 2021-07-31 ENCOUNTER — Other Ambulatory Visit: Payer: Self-pay | Admitting: Internal Medicine

## 2021-08-04 ENCOUNTER — Other Ambulatory Visit: Payer: Self-pay | Admitting: Internal Medicine

## 2021-08-04 NOTE — Telephone Encounter (Signed)
I have not been refilling this medication. Please see who has been following and refilling.  Will need to send rx to MD following.

## 2021-08-04 NOTE — Telephone Encounter (Signed)
I have not been refilling the medication.  Please see who he has been seeing and who has been refilling and notify pharmacy - I do not want him to miss a dose.

## 2021-08-04 NOTE — Telephone Encounter (Signed)
RX Refill: lamictal Last Seen: 05-07-21 Last Ordered: 05-22-20 historical provider Next Appt: 10-30-21

## 2021-09-08 ENCOUNTER — Ambulatory Visit: Payer: Medicare Other | Admitting: Internal Medicine

## 2021-09-08 DIAGNOSIS — C441121 Basal cell carcinoma of skin of right upper eyelid, including canthus: Secondary | ICD-10-CM | POA: Diagnosis not present

## 2021-09-08 DIAGNOSIS — H0289 Other specified disorders of eyelid: Secondary | ICD-10-CM | POA: Diagnosis not present

## 2021-09-08 DIAGNOSIS — D0339 Melanoma in situ of other parts of face: Secondary | ICD-10-CM | POA: Diagnosis not present

## 2021-09-08 DIAGNOSIS — L578 Other skin changes due to chronic exposure to nonionizing radiation: Secondary | ICD-10-CM | POA: Diagnosis not present

## 2021-09-08 DIAGNOSIS — L988 Other specified disorders of the skin and subcutaneous tissue: Secondary | ICD-10-CM | POA: Diagnosis not present

## 2021-10-29 ENCOUNTER — Other Ambulatory Visit: Payer: Self-pay | Admitting: Internal Medicine

## 2021-10-30 ENCOUNTER — Encounter: Payer: Self-pay | Admitting: Internal Medicine

## 2021-10-30 ENCOUNTER — Other Ambulatory Visit: Payer: Self-pay

## 2021-10-30 ENCOUNTER — Ambulatory Visit (INDEPENDENT_AMBULATORY_CARE_PROVIDER_SITE_OTHER): Payer: Medicare Other | Admitting: Internal Medicine

## 2021-10-30 VITALS — BP 128/78 | HR 88 | Temp 97.9°F | Resp 16 | Ht 68.0 in | Wt 158.8 lb

## 2021-10-30 DIAGNOSIS — E7800 Pure hypercholesterolemia, unspecified: Secondary | ICD-10-CM | POA: Diagnosis not present

## 2021-10-30 DIAGNOSIS — Z125 Encounter for screening for malignant neoplasm of prostate: Secondary | ICD-10-CM

## 2021-10-30 DIAGNOSIS — R1310 Dysphagia, unspecified: Secondary | ICD-10-CM

## 2021-10-30 DIAGNOSIS — I700 Atherosclerosis of aorta: Secondary | ICD-10-CM | POA: Diagnosis not present

## 2021-10-30 DIAGNOSIS — I70219 Atherosclerosis of native arteries of extremities with intermittent claudication, unspecified extremity: Secondary | ICD-10-CM

## 2021-10-30 DIAGNOSIS — I739 Peripheral vascular disease, unspecified: Secondary | ICD-10-CM

## 2021-10-30 DIAGNOSIS — D649 Anemia, unspecified: Secondary | ICD-10-CM

## 2021-10-30 DIAGNOSIS — M542 Cervicalgia: Secondary | ICD-10-CM

## 2021-10-30 DIAGNOSIS — Z85819 Personal history of malignant neoplasm of unspecified site of lip, oral cavity, and pharynx: Secondary | ICD-10-CM | POA: Diagnosis not present

## 2021-10-30 DIAGNOSIS — I10 Essential (primary) hypertension: Secondary | ICD-10-CM | POA: Diagnosis not present

## 2021-10-30 DIAGNOSIS — K219 Gastro-esophageal reflux disease without esophagitis: Secondary | ICD-10-CM | POA: Diagnosis not present

## 2021-10-30 DIAGNOSIS — R42 Dizziness and giddiness: Secondary | ICD-10-CM

## 2021-10-30 DIAGNOSIS — G40909 Epilepsy, unspecified, not intractable, without status epilepticus: Secondary | ICD-10-CM

## 2021-10-30 LAB — CBC WITH DIFFERENTIAL/PLATELET
Basophils Absolute: 0.1 10*3/uL (ref 0.0–0.1)
Basophils Relative: 0.9 % (ref 0.0–3.0)
Eosinophils Absolute: 0.1 10*3/uL (ref 0.0–0.7)
Eosinophils Relative: 2.1 % (ref 0.0–5.0)
HCT: 34 % — ABNORMAL LOW (ref 39.0–52.0)
Hemoglobin: 10.9 g/dL — ABNORMAL LOW (ref 13.0–17.0)
Lymphocytes Relative: 24.1 % (ref 12.0–46.0)
Lymphs Abs: 1.6 10*3/uL (ref 0.7–4.0)
MCHC: 32.2 g/dL (ref 30.0–36.0)
MCV: 79.7 fl (ref 78.0–100.0)
Monocytes Absolute: 0.7 10*3/uL (ref 0.1–1.0)
Monocytes Relative: 10.3 % (ref 3.0–12.0)
Neutro Abs: 4.1 10*3/uL (ref 1.4–7.7)
Neutrophils Relative %: 62.6 % (ref 43.0–77.0)
Platelets: 480 10*3/uL — ABNORMAL HIGH (ref 150.0–400.0)
RBC: 4.26 Mil/uL (ref 4.22–5.81)
RDW: 15.3 % (ref 11.5–15.5)
WBC: 6.6 10*3/uL (ref 4.0–10.5)

## 2021-10-30 LAB — HEPATIC FUNCTION PANEL
ALT: 17 U/L (ref 0–53)
AST: 21 U/L (ref 0–37)
Albumin: 4.5 g/dL (ref 3.5–5.2)
Alkaline Phosphatase: 72 U/L (ref 39–117)
Bilirubin, Direct: 0.1 mg/dL (ref 0.0–0.3)
Total Bilirubin: 0.3 mg/dL (ref 0.2–1.2)
Total Protein: 7.4 g/dL (ref 6.0–8.3)

## 2021-10-30 LAB — BASIC METABOLIC PANEL
BUN: 18 mg/dL (ref 6–23)
CO2: 27 mEq/L (ref 19–32)
Calcium: 10.1 mg/dL (ref 8.4–10.5)
Chloride: 101 mEq/L (ref 96–112)
Creatinine, Ser: 1.35 mg/dL (ref 0.40–1.50)
GFR: 56.74 mL/min — ABNORMAL LOW (ref >60.00)
Glucose, Bld: 96 mg/dL (ref 70–99)
Potassium: 5.2 mEq/L — ABNORMAL HIGH (ref 3.5–5.1)
Sodium: 137 mEq/L (ref 135–145)

## 2021-10-30 LAB — LIPID PANEL
Cholesterol: 231 mg/dL — ABNORMAL HIGH (ref 0–200)
HDL: 46.5 mg/dL (ref >39.00)
LDL Cholesterol: 149 mg/dL — ABNORMAL HIGH (ref 0–99)
NonHDL: 184.86
Total CHOL/HDL Ratio: 5
Triglycerides: 178 mg/dL — ABNORMAL HIGH (ref 0.0–149.0)
VLDL: 35.6 mg/dL (ref 0.0–40.0)

## 2021-10-30 LAB — PSA: PSA: 1.33 ng/mL (ref 0.10–4.00)

## 2021-10-30 NOTE — Assessment & Plan Note (Signed)
S/p fem fem bypass.  Followed by [facility].  On eliquis.  Continue pravastatin.  Tolerating 80mg dose.   

## 2021-10-30 NOTE — Progress Notes (Signed)
[name], male   DOB: 1960-08-29, 61 y.o.   MRN: [id number] ? ? ?Subjective:  ? ? [name], male    DOB: 05-01-1961, 61 y.o.   MRN: [id number] ? ?This visit occurred during the SARS-CoV-2 public health emergency.  Safety protocols were in place, including screening questions prior to the visit, additional usage of staff PPE, and extensive cleaning of exam room while observing appropriate contact time as indicated for disinfecting solutions.  ? ?Patient here for a scheduled follow up.  ? ?Chief Complaint  ?Patient presents with  ? Hyperlipidemia  ? Gastroesophageal Reflux  ? Hypertension  ? .  ? ?HPI ?History of acute aorta iliac artery occlusion for which he underwent endovascular stent placement as well as a femoral to femoral bypass graft. Also required angioplasty of the femoral anastomosis.  Continues on eliquis, pravastatin and aspirin.  Evaluated 05/2021 vascular surgery.  Recommended f/u lower extremity ABI testing and f/u in one year.  Also (per his report) had carotids checked and were ok.  Also saw [facility] for neck and shoulder pain.  Having issues with his neck.  Notices some increased neck pain - with increase movement of his head.  Riding tractor, and working - increased light headedness.  Was not sure if related to his neck.  No headache.  No vision change.  Has been to PT for his neck and has had vestibular rehab.  No chest pain or sob.  No increased heart rate or palpitations.  No abdominal pain.  Bowels moving if takes miralax. S/p colonoscopy 04/2021.  Has had issues with dysphagia.  Has been evaluated by GI.  Is stable.  He watches what he eats.   ? ? ?Past Medical History:  ?Diagnosis Date  ? Basal cell carcinoma (BCC) of back   ? Basal cell carcinoma (BCC) of nasal sidewall   ? Dizziness   ? GERD (gastroesophageal reflux disease)   ? H/O blood clots   ? Headache   ? migraines  ? Hypercholesterolemia   ? Hypertension   ? hx of HBP readings  ? Metastatic squamous  cell carcinoma 2018  ? bx cervical lymph node  ? Seizures ([place]) 2021  ? Tonsillar cancer ([place])   ? Vascular abnormality   ? VTE (venous thromboembolism) 2018  ? associated with chemotherapy  ? ?Past Surgical History:  ?Procedure Laterality Date  ? COLONOSCOPY    ? COLONOSCOPY N/A 05/08/2021  ? Procedure: COLONOSCOPY;  Surgeon: Annamaria Helling, DO;  Location: [facility] ENDOSCOPY;  Service: Gastroenterology;  Laterality: N/A;  ? endovascular balloon angioplasty femoral/popliteal artery w/stent Left 11/08/2017  ? ESOPHAGOGASTRODUODENOSCOPY N/A 05/08/2021  ? Procedure: ESOPHAGOGASTRODUODENOSCOPY (EGD);  Surgeon: Annamaria Helling, DO;  Location: [facility] ENDOSCOPY;  Service: Gastroenterology;  Laterality: N/A;  ? ESOPHAGOGASTRODUODENOSCOPY (EGD) WITH PROPOFOL N/A 05/24/2015  ? Procedure: ESOPHAGOGASTRODUODENOSCOPY (EGD) WITH PROPOFOL;  Surgeon: Manya Silvas, MD;  Location: [facility] ENDOSCOPY;  Service: Endoscopy;  Laterality: N/A;  ? FEMORAL-FEMORAL BYPASS GRAFT    ? IR IMAGING GUIDED PORT INSERTION  12/09/2016  ? LEFT HEART CATH AND CORONARY ANGIOGRAPHY N/A 12/25/2019  ? Procedure: LEFT HEART CATH AND CORONARY ANGIOGRAPHY;  Surgeon: Corey Skains, MD;  Location: [place] CV LAB;  Service: Cardiovascular;  Laterality: N/A;  ? SAVORY DILATION N/A 05/24/2015  ? Procedure: SAVORY DILATION;  Surgeon: Manya Silvas, MD;  Location: [facility] ENDOSCOPY;  Service: Endoscopy;  Laterality: N/A;  ? ?Family History  ?Problem Relation Age of Onset  ?  Hyperlipidemia Mother   ? Heart disease Mother   ? Hypertension Mother   ? Alzheimer's disease Mother   ? Prostate cancer Father   ? Stroke Father   ? ?Social History  ? ?Socioeconomic History  ? Marital status: Married  ?  Spouse name: Not on file  ? Number of children: Not on file  ? Years of education: Not on file  ? Highest education level: Not on file  ?Occupational History  ? Occupation: Dealer  ?Tobacco Use  ? Smoking status: Former  ?  Types: Cigarettes  ?  Quit date:  11/11/1996  ?  Years since quitting: 24.9  ? Smokeless tobacco: Never  ?Vaping Use  ? Vaping Use: Never used  ?Substance and Sexual Activity  ? Alcohol use: Yes  ?  Comment: occasional  ? Drug use: No  ? Sexual activity: Yes  ?Other Topics Concern  ? Not on file  ?Social History Narrative  ? Not on file  ? ?Social Determinants of Health  ? ?Financial Resource Strain: Low Risk   ? Difficulty of Paying Living Expenses: Not hard at all  ?Food Insecurity: Not on file  ?Transportation Needs: No Transportation Needs  ? Lack of Transportation (Medical): No  ? Lack of Transportation (Non-Medical): No  ?Physical Activity: Insufficiently Active  ? Days of Exercise per Week: 4 days  ? Minutes of Exercise per Session: 20 min  ?Stress: No Stress Concern Present  ? Feeling of Stress : Not at all  ?Social Connections: Unknown  ? Frequency of Communication with Friends and Family: More than three times a week  ? Frequency of Social Gatherings with Friends and Family: More than three times a week  ? Attends Religious Services: More than 4 times per year  ? Active Member of Clubs or Organizations: Not on file  ? Attends Archivist Meetings: Not on file  ? Marital Status: Married  ? ? ? ?Review of Systems  ?Constitutional:  Negative for appetite change and unexpected weight change.  ?HENT:  Negative for congestion and sinus pressure.   ?Respiratory:  Negative for cough, chest tightness and shortness of breath.   ?Cardiovascular:  Negative for chest pain, palpitations and leg swelling.  ?Gastrointestinal:  Negative for abdominal pain, diarrhea, nausea and vomiting.  ?Genitourinary:  Negative for difficulty urinating and dysuria.  ?Musculoskeletal:  Positive for neck pain. Negative for joint swelling and myalgias.  ?Skin:  Negative for color change and rash.  ?Neurological:  Negative for dizziness and headaches.  ?     Intermittent light headedness.   ?Psychiatric/Behavioral:  Negative for agitation and dysphoric mood.   ? ?    ?Objective:  ?  ? ?BP 128/78   Pulse 88   Temp 97.9 ?F (36.6 ?C)   Resp 16   Ht '5\' 8"'$  (1.727 m)   Wt 158 lb 12.8 oz (72 kg)   SpO2 99%   BMI 24.15 kg/m?  ?Wt Readings from Last 3 Encounters:  ?10/30/21 158 lb 12.8 oz (72 kg)  ?06/20/21 150 lb (68 kg)  ?05/08/21 150 lb (68 kg)  ? ? ?Physical Exam ?Constitutional:   ?   General: He is not in acute distress. ?   Appearance: Normal appearance. He is well-developed.  ?HENT:  ?   Head: Normocephalic and atraumatic.  ?   Right Ear: External ear normal.  ?   Left Ear: External ear normal.  ?Eyes:  ?   General: No scleral icterus.    ?  Right eye: No discharge.     ?   Left eye: No discharge.  ?Cardiovascular:  ?   Rate and Rhythm: Normal rate and regular rhythm.  ?Pulmonary:  ?   Effort: Pulmonary effort is normal. No respiratory distress.  ?   Breath sounds: Normal breath sounds.  ?Abdominal:  ?   General: Bowel sounds are normal.  ?   Palpations: Abdomen is soft.  ?   Tenderness: There is no abdominal tenderness.  ?Musculoskeletal:     ?   General: No swelling or tenderness.  ?   Cervical back: Neck supple. No tenderness.  ?Lymphadenopathy:  ?   Cervical: No cervical adenopathy.  ?Skin: ?   Findings: No erythema or rash.  ?Neurological:  ?   Mental Status: He is alert.  ?Psychiatric:     ?   Mood and Affect: Mood normal.     ?   Behavior: Behavior normal.  ? ? ? ?Outpatient Encounter Medications as of 10/30/2021  ?Medication Sig  ? aspirin EC 81 MG tablet Take 81 mg by mouth daily. Swallow whole.  ? ELIQUIS 5 MG TABS tablet TAKE 1 TABLET BY MOUTH EVERY 12 HOURS  ? lamoTRIgine (LAMICTAL) 100 MG tablet Take 100 mg by mouth 2 (two) times daily.  ? lamoTRIgine (LAMICTAL) 25 MG tablet Take 25 mg by mouth daily.  ? Multiple Vitamin (MULTIVITAMIN) capsule Take by mouth.  ? nitroGLYCERIN (NITROSTAT) 0.4 MG SL tablet Place 0.4 mg under the tongue every 5 (five) minutes as needed for chest pain.  ? omeprazole (PRILOSEC) 20 MG capsule TAKE 1 CAPSULE BY MOUTH TWICE DAILY  ?  pravastatin (PRAVACHOL) 80 MG tablet TAKE 1 TABLET BY MOUTH AT BEDTIME  ? [DISCONTINUED] lisinopril (ZESTRIL) 30 MG tablet TAKE 1 TABLET BY MOUTH DAILY  ? ?No facility-administered encounter medications on file as

## 2021-10-31 ENCOUNTER — Other Ambulatory Visit
Admission: RE | Admit: 2021-10-31 | Discharge: 2021-10-31 | Disposition: A | Discharge status: Home / Self Care | Payer: Medicare Other | Attending: Internal Medicine | Admitting: Internal Medicine

## 2021-10-31 ENCOUNTER — Telehealth: Payer: Self-pay

## 2021-10-31 DIAGNOSIS — D649 Anemia, unspecified: Secondary | ICD-10-CM

## 2021-10-31 DIAGNOSIS — E875 Hyperkalemia: Secondary | ICD-10-CM | POA: Insufficient documentation

## 2021-10-31 LAB — IRON AND TIBC
Iron: 25 ug/dL — ABNORMAL LOW (ref 45–182)
Saturation Ratios: 8 % — ABNORMAL LOW (ref 17.9–39.5)
TIBC: 333 ug/dL (ref 250–450)
UIBC: 308 ug/dL

## 2021-10-31 LAB — POTASSIUM: Potassium: 4.7 mmol/L (ref 3.5–5.1)

## 2021-10-31 LAB — VITAMIN B12: Vitamin B-12: 355 pg/mL (ref 180–914)

## 2021-10-31 LAB — FERRITIN: Ferritin: 7 ng/mL — ABNORMAL LOW (ref 24–336)

## 2021-10-31 NOTE — Telephone Encounter (Addendum)
Pt returning call.Marland KitchenMarland KitchenAdvise pt of note below.... Pt understood.Marland KitchenMarland KitchenLarena Glassman spoke with pt as well... Pt understood.... Pt stated that he is not taking any potassium supplements or salt substitutes. Pt stated that he been doing the same thing that like always...  ?

## 2021-10-31 NOTE — Telephone Encounter (Signed)
LMTCB regarding labs 

## 2021-10-31 NOTE — Telephone Encounter (Signed)
-----   Message from Timothy Pheasant, MD sent at 10/31/2021  5:26 AM EDT ----- ?Notify - potassium is slightly elevated.  Kidney function slightly decreased.  Need to make sure staying hydrated.  Need to confirm not taking any potassium supplements or using an increased amount of salt substitutes. Need to recheck stat potassium at [facility] (today if possible).  Cholesterol has improved from last check, but still elevated. Continue cholesterol medication.  Let him know there is an injectable cholesterol medication.  If agreeable, can d/w him regarding starting repatha.  PSA ok.  Hgb has decreased.  With f/u potassium check, would like to check iron studies and B12.  Liver panel wnl.  ?

## 2021-10-31 NOTE — Telephone Encounter (Signed)
Labs ordered for [facility] mall ?

## 2021-10-31 NOTE — Addendum Note (Signed)
Addended by: Lars Masson on: 10/31/2021 01:38 PM ? ? Modules accepted: Orders ? ?

## 2021-11-01 ENCOUNTER — Telehealth: Payer: Self-pay | Admitting: Internal Medicine

## 2021-11-01 ENCOUNTER — Encounter: Payer: Self-pay | Admitting: Internal Medicine

## 2021-11-01 DIAGNOSIS — R42 Dizziness and giddiness: Secondary | ICD-10-CM | POA: Insufficient documentation

## 2021-11-01 NOTE — Assessment & Plan Note (Signed)
Some leg pain with increased exertion.  Stable.  Sits and rest.  Followed by vascular.   ?

## 2021-11-01 NOTE — Assessment & Plan Note (Signed)
S/p fem fem bypass.  Followed by [facility].  On eliquis.  Continue pravastatin.  Tolerating 80mg dose.   

## 2021-11-01 NOTE — Assessment & Plan Note (Signed)
Continue pravastatin.  Low cholesterol diet and exercise.  Follow lipid panel and liver function tests.   

## 2021-11-01 NOTE — Assessment & Plan Note (Signed)
Follow cbc.  

## 2021-11-01 NOTE — Assessment & Plan Note (Signed)
No upper symptoms related.  On prilosec.  

## 2021-11-01 NOTE — Assessment & Plan Note (Signed)
Continue lisinopril.  Follow pressures.  Follow metabolic panel.  

## 2021-11-01 NOTE — Assessment & Plan Note (Signed)
S/p XRT and chemo.  Followed by oncology.  

## 2021-11-01 NOTE — Telephone Encounter (Signed)
He has seen Dr Manuella Ghazi for seizures.  Needs f/u appt for light headedness. Please call and schedule.  Also, see lab result note.  ?

## 2021-11-01 NOTE — Assessment & Plan Note (Signed)
Had previously required esophageal stretching.  Reflux controlled with prilosec.  Discussed small bites, chewing food well, etc.  Continue f/u with oncology.  ?

## 2021-11-01 NOTE — Assessment & Plan Note (Signed)
Occasional light headedness as outlined. Noticed with increased neck discomfort and with increased twisting and turing.  Discussed continuing PT exercises and vestibular exercises.  Also discussed f/u with neurology.   ?

## 2021-11-01 NOTE — Assessment & Plan Note (Signed)
Continues on lamictal.  No recent seizures.  Follow.  

## 2021-11-01 NOTE — Assessment & Plan Note (Signed)
Has seen neurosurgery.   MRI -   multilevel degenerative changes most concerning for facet arthropathy. PT.  Have discussed possible pain clinic referral.  Was questioning if this is contributing to intermittent light headedness - noticed when working and twisting and turning neck a lot.  Continue exercises - PT and vestibular.   ? ?

## 2021-11-06 NOTE — Telephone Encounter (Signed)
S/w Dr Manuella Ghazi office, scheduled patient for 11/07/21 at 1:15pm for light headedness. ?S/w patient, gave him information for appointment. ?Pt stated probably will call Dr Manuella Ghazi and reschedule that. Didn't think it would be soo quick.  ? ?I asked patient if he wanted me to re-schedule. Pt stated he would do it. ?

## 2021-11-27 ENCOUNTER — Other Ambulatory Visit: Payer: Self-pay | Admitting: *Deleted

## 2021-11-27 DIAGNOSIS — D649 Anemia, unspecified: Secondary | ICD-10-CM

## 2021-11-30 ENCOUNTER — Other Ambulatory Visit: Payer: Self-pay | Admitting: Family

## 2021-12-02 ENCOUNTER — Other Ambulatory Visit (INDEPENDENT_AMBULATORY_CARE_PROVIDER_SITE_OTHER): Payer: Medicare Other

## 2021-12-02 DIAGNOSIS — D649 Anemia, unspecified: Secondary | ICD-10-CM

## 2021-12-02 DIAGNOSIS — H25813 Combined forms of age-related cataract, bilateral: Secondary | ICD-10-CM | POA: Diagnosis not present

## 2021-12-02 LAB — CBC WITH DIFFERENTIAL/PLATELET
Basophils Absolute: 0 10*3/uL (ref 0.0–0.1)
Basophils Relative: 0.7 % (ref 0.0–3.0)
Eosinophils Absolute: 0.1 10*3/uL (ref 0.0–0.7)
Eosinophils Relative: 2.1 % (ref 0.0–5.0)
HCT: 33.1 % — ABNORMAL LOW (ref 39.0–52.0)
Hemoglobin: 10.6 g/dL — ABNORMAL LOW (ref 13.0–17.0)
Lymphocytes Relative: 27.6 % (ref 12.0–46.0)
Lymphs Abs: 1.8 10*3/uL (ref 0.7–4.0)
MCHC: 32.1 g/dL (ref 30.0–36.0)
MCV: 78.9 fl (ref 78.0–100.0)
Monocytes Absolute: 0.9 10*3/uL (ref 0.1–1.0)
Monocytes Relative: 13.5 % — ABNORMAL HIGH (ref 3.0–12.0)
Neutro Abs: 3.6 10*3/uL (ref 1.4–7.7)
Neutrophils Relative %: 56.1 % (ref 43.0–77.0)
Platelets: 449 10*3/uL — ABNORMAL HIGH (ref 150.0–400.0)
RBC: 4.2 Mil/uL — ABNORMAL LOW (ref 4.22–5.81)
RDW: 16.7 % — ABNORMAL HIGH (ref 11.5–15.5)
WBC: 6.4 10*3/uL (ref 4.0–10.5)

## 2021-12-02 LAB — IBC + FERRITIN
Ferritin: 8.8 ng/mL — ABNORMAL LOW (ref 22.0–322.0)
Iron: 84 ug/dL (ref 42–165)
Saturation Ratios: 27.1 % (ref 20.0–50.0)
TIBC: 309.4 ug/dL (ref 250.0–450.0)
Transferrin: 221 mg/dL (ref 212.0–360.0)

## 2021-12-13 ENCOUNTER — Other Ambulatory Visit: Payer: Self-pay | Admitting: Internal Medicine

## 2021-12-13 DIAGNOSIS — Z85819 Personal history of malignant neoplasm of unspecified site of lip, oral cavity, and pharynx: Secondary | ICD-10-CM

## 2021-12-13 DIAGNOSIS — D649 Anemia, unspecified: Secondary | ICD-10-CM

## 2021-12-13 NOTE — Progress Notes (Signed)
Order placed for hematology referral.  

## 2021-12-18 ENCOUNTER — Telehealth: Payer: Self-pay | Admitting: *Deleted

## 2021-12-18 NOTE — Telephone Encounter (Signed)
Patient was a previous patient for Dr. Janese Banks for head and neck cancer and now is having anemia.  Patient needs another appointment but it will be a follow-up since we have already seen him in the past.  Dr. Janese Banks had said that the patient can come on May 8 at 1 PM.  Anderson Malta called patient and let him know the date and time and patient is agreeable to come on that date.  If we need labs drawn we will do it after Dr. Janese Banks sees him. ?

## 2021-12-22 ENCOUNTER — Encounter: Payer: Self-pay | Admitting: Oncology

## 2021-12-22 ENCOUNTER — Other Ambulatory Visit: Payer: Medicare Other

## 2021-12-22 ENCOUNTER — Inpatient Hospital Stay: Payer: Medicare Other | Attending: Oncology | Admitting: Oncology

## 2021-12-22 VITALS — BP 145/84 | HR 91 | Temp 97.6°F | Resp 16 | Wt 159.0 lb

## 2021-12-22 DIAGNOSIS — Z85828 Personal history of other malignant neoplasm of skin: Secondary | ICD-10-CM | POA: Insufficient documentation

## 2021-12-22 DIAGNOSIS — Z79899 Other long term (current) drug therapy: Secondary | ICD-10-CM | POA: Diagnosis not present

## 2021-12-22 DIAGNOSIS — Z8042 Family history of malignant neoplasm of prostate: Secondary | ICD-10-CM | POA: Insufficient documentation

## 2021-12-22 DIAGNOSIS — E7800 Pure hypercholesterolemia, unspecified: Secondary | ICD-10-CM | POA: Diagnosis not present

## 2021-12-22 DIAGNOSIS — Z85818 Personal history of malignant neoplasm of other sites of lip, oral cavity, and pharynx: Secondary | ICD-10-CM | POA: Diagnosis not present

## 2021-12-22 DIAGNOSIS — I10 Essential (primary) hypertension: Secondary | ICD-10-CM | POA: Diagnosis not present

## 2021-12-22 DIAGNOSIS — D509 Iron deficiency anemia, unspecified: Secondary | ICD-10-CM | POA: Insufficient documentation

## 2021-12-22 DIAGNOSIS — D508 Other iron deficiency anemias: Secondary | ICD-10-CM

## 2021-12-22 DIAGNOSIS — K219 Gastro-esophageal reflux disease without esophagitis: Secondary | ICD-10-CM | POA: Insufficient documentation

## 2021-12-22 DIAGNOSIS — Z87891 Personal history of nicotine dependence: Secondary | ICD-10-CM | POA: Insufficient documentation

## 2021-12-23 ENCOUNTER — Encounter: Payer: Self-pay | Admitting: Oncology

## 2021-12-23 DIAGNOSIS — D509 Iron deficiency anemia, unspecified: Secondary | ICD-10-CM | POA: Insufficient documentation

## 2021-12-25 ENCOUNTER — Encounter: Payer: Self-pay | Admitting: Oncology

## 2021-12-25 NOTE — Progress Notes (Signed)
? ? ? ?Hematology/Oncology Consult note ?[place]  ?[phone] [phone] ? ?Patient Care Team: ?Einar Pheasant, MD as PCP - General (Internal Medicine)  ? ?Name of the patient: Timothy Garrison  ?[id number]  ?[birth year]  ? ?Date of visit: 12/25/21 ? ?Diagnosis-history of head and neck cancer in remission ?Iron deficiency anemia ? ?Chief complaint/ Reason for visit-history of head and neck cancer now referred for anemia ? ?Heme/Onc history: Patient is a 61 yr old male with h/o Stage I HPV positive oropharyngeal cancer in 2018. He only received 2 weekly doses of cisplatin back then following which he developed limb threatenign acute aortic thrombosis and required intervention by vascular surgery. RT was cut short by 1 week back then. PET showed NED in October 2018 after treatment.  ?  ? ?Interval history- Patient has now been referred for his anemia.  His most recent labs from 12/02/2021 showed white cell count of 6.4, H&H of 10.6/33.1 with an MCV of 78 and a platelet count of 449.  Iron studies showed a low ferritin of 8.8 B12 levels were normal at 355.  Overall his hemoglobin is gradually drifted down from 12 a year ago to presently 10.6.  He denies any blood loss in his stool or urine.  States that he has an appointment coming up with GI soon. ?He currently reports ongoing fatigue ? ?ECOG PS- 1 ?Pain scale- 0 ? ? ?Review of systems- Review of Systems  ?Constitutional:  Positive for malaise/fatigue. Negative for chills, fever and weight loss.  ?HENT:  Negative for congestion, ear discharge and nosebleeds.   ?Eyes:  Negative for blurred vision.  ?Respiratory:  Negative for cough, hemoptysis, sputum production, shortness of breath and wheezing.   ?Cardiovascular:  Negative for chest pain, palpitations, orthopnea and claudication.  ?Gastrointestinal:  Negative for abdominal pain, blood in stool, constipation, diarrhea, heartburn, melena, nausea and vomiting.  ?Genitourinary:   Negative for dysuria, flank pain, frequency, hematuria and urgency.  ?Musculoskeletal:  Negative for back pain, joint pain and myalgias.  ?Skin:  Negative for rash.  ?Neurological:  Negative for dizziness, tingling, focal weakness, seizures, weakness and headaches.  ?Endo/Heme/Allergies:  Does not bruise/bleed easily.  ?Psychiatric/Behavioral:  Negative for depression and suicidal ideas. The patient does not have insomnia.    ? ? ? ?Allergies  ?Allergen Reactions  ? Atorvastatin   ? ? ? ?Past Medical History:  ?Diagnosis Date  ? Basal cell carcinoma (BCC) of back   ? Basal cell carcinoma (BCC) of nasal sidewall   ? Dizziness   ? GERD (gastroesophageal reflux disease)   ? H/O blood clots   ? Headache   ? migraines  ? Hypercholesterolemia   ? Hypertension   ? hx of HBP readings  ? Metastatic squamous cell carcinoma 2018  ? bx cervical lymph node  ? Seizures ([place]) 2021  ? Tonsillar cancer ([place])   ? Vascular abnormality   ? VTE (venous thromboembolism) 2018  ? associated with chemotherapy  ? ? ? ?Past Surgical History:  ?Procedure Laterality Date  ? COLONOSCOPY    ? COLONOSCOPY N/A 05/08/2021  ? Procedure: COLONOSCOPY;  Surgeon: Annamaria Helling, DO;  Location: [facility] ENDOSCOPY;  Service: Gastroenterology;  Laterality: N/A;  ? endovascular balloon angioplasty femoral/popliteal artery w/stent Left 11/08/2017  ? ESOPHAGOGASTRODUODENOSCOPY N/A 05/08/2021  ? Procedure: ESOPHAGOGASTRODUODENOSCOPY (EGD);  Surgeon: Annamaria Helling, DO;  Location: [facility] ENDOSCOPY;  Service: Gastroenterology;  Laterality: N/A;  ? ESOPHAGOGASTRODUODENOSCOPY (EGD) WITH PROPOFOL N/A 05/24/2015  ? Procedure: ESOPHAGOGASTRODUODENOSCOPY (EGD)  WITH PROPOFOL;  Surgeon: Manya Silvas, MD;  Location: [facility] ENDOSCOPY;  Service: Endoscopy;  Laterality: N/A;  ? FEMORAL-FEMORAL BYPASS GRAFT    ? IR IMAGING GUIDED PORT INSERTION  12/09/2016  ? LEFT HEART CATH AND CORONARY ANGIOGRAPHY N/A 12/25/2019  ? Procedure: LEFT HEART CATH AND CORONARY  ANGIOGRAPHY;  Surgeon: Corey Skains, MD;  Location: [place] CV LAB;  Service: Cardiovascular;  Laterality: N/A;  ? SAVORY DILATION N/A 05/24/2015  ? Procedure: SAVORY DILATION;  Surgeon: Manya Silvas, MD;  Location: [facility] ENDOSCOPY;  Service: Endoscopy;  Laterality: N/A;  ? ? ?Social History  ? ?Socioeconomic History  ? Marital status: Married  ?  Spouse name: Not on file  ? Number of children: Not on file  ? Years of education: Not on file  ? Highest education level: Not on file  ?Occupational History  ? Occupation: Dealer  ?Tobacco Use  ? Smoking status: Former  ?  Types: Cigarettes  ?  Quit date: 11/11/1996  ?  Years since quitting: 25.1  ? Smokeless tobacco: Never  ?Vaping Use  ? Vaping Use: Never used  ?Substance and Sexual Activity  ? Alcohol use: Yes  ?  Comment: occasional  ? Drug use: No  ? Sexual activity: Yes  ?Other Topics Concern  ? Not on file  ?Social History Narrative  ? Not on file  ? ?Social Determinants of Health  ? ?Financial Resource Strain: Low Risk   ? Difficulty of Paying Living Expenses: Not hard at all  ?Food Insecurity: Not on file  ?Transportation Needs: No Transportation Needs  ? Lack of Transportation (Medical): No  ? Lack of Transportation (Non-Medical): No  ?Physical Activity: Insufficiently Active  ? Days of Exercise per Week: 4 days  ? Minutes of Exercise per Session: 20 min  ?Stress: No Stress Concern Present  ? Feeling of Stress : Not at all  ?Social Connections: Unknown  ? Frequency of Communication with Friends and Family: More than three times a week  ? Frequency of Social Gatherings with Friends and Family: More than three times a week  ? Attends Religious Services: More than 4 times per year  ? Active Member of Clubs or Organizations: Not on file  ? Attends Archivist Meetings: Not on file  ? Marital Status: Married  ?Intimate Partner Violence: Not At Risk  ? Fear of Current or Ex-Partner: No  ? Emotionally Abused: No  ? Physically Abused: No  ?  Sexually Abused: No  ? ? ?Family History  ?Problem Relation Age of Onset  ? Hyperlipidemia Mother   ? Heart disease Mother   ? Hypertension Mother   ? Alzheimer's disease Mother   ? Prostate cancer Father   ? Stroke Father   ? ? ? ?Current Outpatient Medications:  ?  aspirin EC 81 MG tablet, Take 81 mg by mouth daily. Swallow whole., Disp: , Rfl:  ?  ELIQUIS 5 MG TABS tablet, TAKE 1 TABLET BY MOUTH EVERY 12 HOURS, Disp: 180 tablet, Rfl: 1 ?  lamoTRIgine (LAMICTAL) 100 MG tablet, Take 100 mg by mouth 2 (two) times daily., Disp: , Rfl:  ?  lisinopril (ZESTRIL) 30 MG tablet, TAKE 1 TABLET BY MOUTH DAILY, Disp: 30 tablet, Rfl: 3 ?  Multiple Vitamin (MULTIVITAMIN) capsule, Take by mouth., Disp: , Rfl:  ?  omeprazole (PRILOSEC) 20 MG capsule, TAKE 1 CAPSULE BY MOUTH TWICE DAILY, Disp: 180 capsule, Rfl: 2 ?  pravastatin (PRAVACHOL) 80 MG tablet, TAKE 1 TABLET BY MOUTH  AT BEDTIME, Disp: 90 tablet, Rfl: 1 ?  lamoTRIgine (LAMICTAL) 25 MG tablet, Take 25 mg by mouth daily. (Patient not taking: Reported on 12/22/2021), Disp: , Rfl:  ?  nitroGLYCERIN (NITROSTAT) 0.4 MG SL tablet, Place 0.4 mg under the tongue every 5 (five) minutes as needed for chest pain. (Patient not taking: Reported on 12/22/2021), Disp: , Rfl:  ? ?Physical exam:  ?Vitals:  ? 12/22/21 1300  ?BP: (!) 145/84  ?Pulse: 91  ?Resp: 16  ?Temp: 97.6 ?F (36.4 ?C)  ?SpO2: 100%  ?Weight: 159 lb (72.1 kg)  ? ?Physical Exam ?Constitutional:   ?   General: He is not in acute distress. ?Cardiovascular:  ?   Rate and Rhythm: Normal rate and regular rhythm.  ?   Heart sounds: Normal heart sounds.  ?Pulmonary:  ?   Effort: Pulmonary effort is normal.  ?   Breath sounds: Normal breath sounds.  ?Abdominal:  ?   General: Bowel sounds are normal.  ?   Palpations: Abdomen is soft.  ?Skin: ?   General: Skin is warm and dry.  ?Neurological:  ?   Mental Status: He is alert and oriented to person, place, and time.  ?  ? ? ?  Latest Ref Rng & Units 10/31/2021  ?  1:49 PM  ?CMP  ?Potassium  3.5 - 5.1 mmol/L 4.7    ? ? ?  Latest Ref Rng & Units 12/02/2021  ?  8:54 AM  ?CBC  ?WBC 4.0 - 10.5 K/uL 6.4    ?Hemoglobin 13.0 - 17.0 g/dL 10.6    ?Hematocrit 39.0 - 52.0 % 33.1    ?Platelets 150.0 - 400.0 K/uL 449.0

## 2021-12-28 ENCOUNTER — Other Ambulatory Visit: Payer: Self-pay | Admitting: Internal Medicine

## 2021-12-28 ENCOUNTER — Other Ambulatory Visit: Payer: Self-pay | Admitting: Family

## 2021-12-29 ENCOUNTER — Inpatient Hospital Stay: Payer: Medicare Other

## 2021-12-29 VITALS — BP 156/96 | HR 104 | Temp 98.0°F | Resp 20

## 2021-12-29 DIAGNOSIS — Z85818 Personal history of malignant neoplasm of other sites of lip, oral cavity, and pharynx: Secondary | ICD-10-CM | POA: Diagnosis not present

## 2021-12-29 DIAGNOSIS — E7800 Pure hypercholesterolemia, unspecified: Secondary | ICD-10-CM | POA: Diagnosis not present

## 2021-12-29 DIAGNOSIS — Z87891 Personal history of nicotine dependence: Secondary | ICD-10-CM | POA: Diagnosis not present

## 2021-12-29 DIAGNOSIS — K219 Gastro-esophageal reflux disease without esophagitis: Secondary | ICD-10-CM | POA: Diagnosis not present

## 2021-12-29 DIAGNOSIS — I10 Essential (primary) hypertension: Secondary | ICD-10-CM | POA: Diagnosis not present

## 2021-12-29 DIAGNOSIS — D508 Other iron deficiency anemias: Secondary | ICD-10-CM

## 2021-12-29 DIAGNOSIS — Z85828 Personal history of other malignant neoplasm of skin: Secondary | ICD-10-CM | POA: Diagnosis not present

## 2021-12-29 DIAGNOSIS — D509 Iron deficiency anemia, unspecified: Secondary | ICD-10-CM | POA: Diagnosis not present

## 2021-12-29 DIAGNOSIS — Z79899 Other long term (current) drug therapy: Secondary | ICD-10-CM | POA: Diagnosis not present

## 2021-12-29 MED ORDER — SODIUM CHLORIDE 0.9 % IV SOLN
510.0000 mg | INTRAVENOUS | Status: DC
  Administered 2021-12-29: 510 mg via INTRAVENOUS
  Filled 2021-12-29: qty 510

## 2021-12-29 MED ORDER — SODIUM CHLORIDE 0.9 % IV SOLN
Freq: Once | INTRAVENOUS | Status: AC
  Filled 2021-12-29: qty 250

## 2021-12-29 NOTE — Patient Instructions (Signed)

## 2022-01-05 ENCOUNTER — Inpatient Hospital Stay: Payer: Medicare Other

## 2022-01-05 VITALS — BP 143/88 | HR 80 | Temp 98.3°F | Resp 18

## 2022-01-05 DIAGNOSIS — Z85828 Personal history of other malignant neoplasm of skin: Secondary | ICD-10-CM | POA: Diagnosis not present

## 2022-01-05 DIAGNOSIS — Z87891 Personal history of nicotine dependence: Secondary | ICD-10-CM | POA: Diagnosis not present

## 2022-01-05 DIAGNOSIS — E7800 Pure hypercholesterolemia, unspecified: Secondary | ICD-10-CM | POA: Diagnosis not present

## 2022-01-05 DIAGNOSIS — Z79899 Other long term (current) drug therapy: Secondary | ICD-10-CM | POA: Diagnosis not present

## 2022-01-05 DIAGNOSIS — Z85818 Personal history of malignant neoplasm of other sites of lip, oral cavity, and pharynx: Secondary | ICD-10-CM | POA: Diagnosis not present

## 2022-01-05 DIAGNOSIS — I10 Essential (primary) hypertension: Secondary | ICD-10-CM | POA: Diagnosis not present

## 2022-01-05 DIAGNOSIS — D509 Iron deficiency anemia, unspecified: Secondary | ICD-10-CM | POA: Diagnosis not present

## 2022-01-05 DIAGNOSIS — D508 Other iron deficiency anemias: Secondary | ICD-10-CM

## 2022-01-05 DIAGNOSIS — K219 Gastro-esophageal reflux disease without esophagitis: Secondary | ICD-10-CM | POA: Diagnosis not present

## 2022-01-05 MED ORDER — SODIUM CHLORIDE 0.9 % IV SOLN
Freq: Once | INTRAVENOUS | Status: AC
  Filled 2022-01-05: qty 250

## 2022-01-05 MED ORDER — SODIUM CHLORIDE 0.9 % IV SOLN
510.0000 mg | INTRAVENOUS | Status: DC
  Administered 2022-01-05: 510 mg via INTRAVENOUS
  Filled 2022-01-05: qty 510

## 2022-01-05 NOTE — Patient Instructions (Signed)

## 2022-01-30 ENCOUNTER — Other Ambulatory Visit: Payer: Self-pay | Admitting: Internal Medicine

## 2022-02-05 DIAGNOSIS — R42 Dizziness and giddiness: Secondary | ICD-10-CM | POA: Diagnosis not present

## 2022-02-05 DIAGNOSIS — Z87898 Personal history of other specified conditions: Secondary | ICD-10-CM | POA: Diagnosis not present

## 2022-02-05 DIAGNOSIS — I70219 Atherosclerosis of native arteries of extremities with intermittent claudication, unspecified extremity: Secondary | ICD-10-CM | POA: Diagnosis not present

## 2022-02-05 DIAGNOSIS — Z8673 Personal history of transient ischemic attack (TIA), and cerebral infarction without residual deficits: Secondary | ICD-10-CM | POA: Diagnosis not present

## 2022-02-05 DIAGNOSIS — R790 Abnormal level of blood mineral: Secondary | ICD-10-CM | POA: Diagnosis not present

## 2022-02-05 DIAGNOSIS — E781 Pure hyperglyceridemia: Secondary | ICD-10-CM | POA: Diagnosis not present

## 2022-02-23 DIAGNOSIS — K222 Esophageal obstruction: Secondary | ICD-10-CM | POA: Diagnosis not present

## 2022-02-23 DIAGNOSIS — K5710 Diverticulosis of small intestine without perforation or abscess without bleeding: Secondary | ICD-10-CM | POA: Diagnosis not present

## 2022-02-23 DIAGNOSIS — K219 Gastro-esophageal reflux disease without esophagitis: Secondary | ICD-10-CM | POA: Diagnosis not present

## 2022-02-23 DIAGNOSIS — R1319 Other dysphagia: Secondary | ICD-10-CM | POA: Diagnosis not present

## 2022-02-23 DIAGNOSIS — D509 Iron deficiency anemia, unspecified: Secondary | ICD-10-CM | POA: Diagnosis not present

## 2022-02-27 ENCOUNTER — Other Ambulatory Visit: Payer: Self-pay | Admitting: Family

## 2022-03-04 ENCOUNTER — Ambulatory Visit (INDEPENDENT_AMBULATORY_CARE_PROVIDER_SITE_OTHER): Payer: Medicare Other | Admitting: Internal Medicine

## 2022-03-04 ENCOUNTER — Encounter: Payer: Self-pay | Admitting: Internal Medicine

## 2022-03-04 ENCOUNTER — Telehealth: Payer: Self-pay | Admitting: Internal Medicine

## 2022-03-04 VITALS — BP 138/80 | HR 80 | Temp 98.2°F | Resp 17 | Ht 68.0 in | Wt 157.6 lb

## 2022-03-04 DIAGNOSIS — D649 Anemia, unspecified: Secondary | ICD-10-CM | POA: Diagnosis not present

## 2022-03-04 DIAGNOSIS — E7800 Pure hypercholesterolemia, unspecified: Secondary | ICD-10-CM | POA: Diagnosis not present

## 2022-03-04 DIAGNOSIS — K219 Gastro-esophageal reflux disease without esophagitis: Secondary | ICD-10-CM

## 2022-03-04 DIAGNOSIS — I70219 Atherosclerosis of native arteries of extremities with intermittent claudication, unspecified extremity: Secondary | ICD-10-CM | POA: Diagnosis not present

## 2022-03-04 DIAGNOSIS — Z0000 Encounter for general adult medical examination without abnormal findings: Secondary | ICD-10-CM

## 2022-03-04 DIAGNOSIS — I10 Essential (primary) hypertension: Secondary | ICD-10-CM

## 2022-03-04 DIAGNOSIS — Z85819 Personal history of malignant neoplasm of unspecified site of lip, oral cavity, and pharynx: Secondary | ICD-10-CM

## 2022-03-04 DIAGNOSIS — I700 Atherosclerosis of aorta: Secondary | ICD-10-CM

## 2022-03-04 DIAGNOSIS — G40909 Epilepsy, unspecified, not intractable, without status epilepticus: Secondary | ICD-10-CM

## 2022-03-04 LAB — HEPATIC FUNCTION PANEL
ALT: 26 U/L (ref 0–53)
AST: 25 U/L (ref 0–37)
Albumin: 4.7 g/dL (ref 3.5–5.2)
Alkaline Phosphatase: 70 U/L (ref 39–117)
Bilirubin, Direct: 0.1 mg/dL (ref 0.0–0.3)
Total Bilirubin: 0.5 mg/dL (ref 0.2–1.2)
Total Protein: 7.4 g/dL (ref 6.0–8.3)

## 2022-03-04 LAB — BASIC METABOLIC PANEL
BUN: 20 mg/dL (ref 6–23)
CO2: 25 mEq/L (ref 19–32)
Calcium: 9.8 mg/dL (ref 8.4–10.5)
Chloride: 99 mEq/L (ref 96–112)
Creatinine, Ser: 1.28 mg/dL (ref 0.40–1.50)
GFR: 60.34 mL/min (ref >60.00)
Glucose, Bld: 94 mg/dL (ref 70–99)
Potassium: 4.8 mEq/L (ref 3.5–5.1)
Sodium: 134 mEq/L — ABNORMAL LOW (ref 135–145)

## 2022-03-04 LAB — LIPID PANEL
Cholesterol: 236 mg/dL — ABNORMAL HIGH (ref 0–200)
HDL: 43.4 mg/dL (ref >39.00)
LDL Cholesterol: 164 mg/dL — ABNORMAL HIGH (ref 0–99)
NonHDL: 192.27
Total CHOL/HDL Ratio: 5
Triglycerides: 142 mg/dL (ref 0.0–149.0)
VLDL: 28.4 mg/dL (ref 0.0–40.0)

## 2022-03-04 LAB — TSH: TSH: 2.06 u[IU]/mL (ref 0.35–5.50)

## 2022-03-04 MED ORDER — LISINOPRIL 30 MG PO TABS: 30.0000 mg | ORAL_TABLET | Freq: Every day | ORAL | 3 refills | Status: DC

## 2022-03-04 NOTE — Assessment & Plan Note (Signed)
Continue pravastatin.  Low cholesterol diet and exercise.  Follow lipid panel and liver function tests.   

## 2022-03-04 NOTE — Assessment & Plan Note (Signed)
S/p fem fem bypass.  Followed by [facility].  On eliquis.  Continue pravastatin.  Tolerating 80mg dose.   

## 2022-03-04 NOTE — Assessment & Plan Note (Signed)
No upper symptoms related.  On prilosec.  

## 2022-03-04 NOTE — Assessment & Plan Note (Signed)
Continue lisinopril.  Follow pressures.  Follow metabolic panel.  

## 2022-03-04 NOTE — Assessment & Plan Note (Signed)
Continues on lamictal.  No recent seizures.  Follow. Check lamictal level next labs.

## 2022-03-04 NOTE — Assessment & Plan Note (Signed)
Physical today 03/04/22.  Colonoscopy 04/2021.  Just saw GI.  Planning capsule study.  PSA 10/30/21 - 1.33.

## 2022-03-04 NOTE — Telephone Encounter (Signed)
Letter dictated for jury duty

## 2022-03-04 NOTE — Progress Notes (Signed)
Patient ID: TADEN WITTER, male   DOB: 09-04-1960, 61 y.o.   MRN: [id number]   Subjective:    Patient ID: JESSON FOSKEY, male    DOB: 1961/04/15, 61 y.o.   MRN: [id number]   Patient here for his physical exam.   Chief Complaint  Patient presents with   Follow-up    Yearly CPE   .   HPI He reports he is doing relatively well.  Tries to stay active.  No chest pain or sob reported.  No cough or congestion.  No acid reflux.  Takes miralax q 2-3 days. Keeps bowels moving.  Blood pressures averaging 130/75-80 on outside checks.  Seeing GI for IDA. Endoscopy and colonoscopy 04/2021 - reassuring.  Planning for capsule endoscopy next week.  Sees neurology - f/u unprovoked convulsive seizure - continues on lamotrigine.  Seeing hemaotology - low iron - receiving infusions.     Past Medical History:  Diagnosis Date   Basal cell carcinoma (BCC) of back    Basal cell carcinoma (BCC) of nasal sidewall    Dizziness    GERD (gastroesophageal reflux disease)    H/O blood clots    Headache    migraines   Hypercholesterolemia    Hypertension    hx of HBP readings   Metastatic squamous cell carcinoma 2018   bx cervical lymph node   Seizures ([place]) 2021   Tonsillar cancer (HCC)    Vascular abnormality    VTE (venous thromboembolism) 2018   associated with chemotherapy   Past Surgical History:  Procedure Laterality Date   COLONOSCOPY     COLONOSCOPY N/A 05/08/2021   Procedure: COLONOSCOPY;  Surgeon: Annamaria Helling, DO;  Location: [facility] ENDOSCOPY;  Service: Gastroenterology;  Laterality: N/A;   endovascular balloon angioplasty femoral/popliteal artery w/stent Left 11/08/2017   ESOPHAGOGASTRODUODENOSCOPY N/A 05/08/2021   Procedure: ESOPHAGOGASTRODUODENOSCOPY (EGD);  Surgeon: Annamaria Helling, DO;  Location: [facility] ENDOSCOPY;  Service: Gastroenterology;  Laterality: N/A;   ESOPHAGOGASTRODUODENOSCOPY (EGD) WITH PROPOFOL N/A 05/24/2015   Procedure: ESOPHAGOGASTRODUODENOSCOPY (EGD) WITH  PROPOFOL;  Surgeon: Manya Silvas, MD;  Location: [facility] ENDOSCOPY;  Service: Endoscopy;  Laterality: N/A;   FEMORAL-FEMORAL BYPASS GRAFT     IR IMAGING GUIDED PORT INSERTION  12/09/2016   LEFT HEART CATH AND CORONARY ANGIOGRAPHY N/A 12/25/2019   Procedure: LEFT HEART CATH AND CORONARY ANGIOGRAPHY;  Surgeon: Corey Skains, MD;  Location: [place] CV LAB;  Service: Cardiovascular;  Laterality: N/A;   SAVORY DILATION N/A 05/24/2015   Procedure: SAVORY DILATION;  Surgeon: Manya Silvas, MD;  Location: [facility] ENDOSCOPY;  Service: Endoscopy;  Laterality: N/A;   Family History  Problem Relation Age of Onset   Hyperlipidemia Mother    Heart disease Mother    Hypertension Mother    Alzheimer's disease Mother    Prostate cancer Father    Stroke Father    Social History   Socioeconomic History   Marital status: Married    Spouse name: Not on file   Number of children: Not on file   Years of education: Not on file   Highest education level: Not on file  Occupational History   Occupation: Dealer  Tobacco Use   Smoking status: Former    Types: Cigarettes    Quit date: 11/11/1996    Years since quitting: 25.3   Smokeless tobacco: Never  Vaping Use   Vaping Use: Never used  Substance and Sexual Activity   Alcohol use: Yes    Comment: occasional   Drug  use: No   Sexual activity: Yes  Other Topics Concern   Not on file  Social History Narrative   Not on file   Social Determinants of Health   Financial Resource Strain: Low Risk  (06/20/2021)   Overall Financial Resource Strain (CARDIA)    Difficulty of Paying Living Expenses: Not hard at all  Food Insecurity: No Food Insecurity (05/08/2020)   Hunger Vital Sign    Worried About Running Out of Food in the Last Year: Never true    Ran Out of Food in the Last Year: Never true  Transportation Needs: No Transportation Needs (06/20/2021)   PRAPARE - Hydrologist (Medical): No    Lack of  Transportation (Non-Medical): No  Physical Activity: Insufficiently Active (06/20/2021)   Exercise Vital Sign    Days of Exercise per Week: 4 days    Minutes of Exercise per Session: 20 min  Stress: No Stress Concern Present (06/20/2021)   [place]    Feeling of Stress : Not at all  Social Connections: Unknown (06/20/2021)   Social Connection and Isolation Panel [NHANES]    Frequency of Communication with Friends and Family: More than three times a week    Frequency of Social Gatherings with Friends and Family: More than three times a week    Attends Religious Services: More than 4 times per year    Active Member of [employer] or Organizations: Not on file    Attends Archivist Meetings: Not on file    Marital Status: Married     Review of Systems  Constitutional:  Negative for appetite change and unexpected weight change.  HENT:  Negative for congestion, sinus pressure and sore throat.   Eyes:  Negative for pain and visual disturbance.  Respiratory:  Negative for cough, chest tightness and shortness of breath.   Cardiovascular:  Negative for chest pain, palpitations and leg swelling.  Gastrointestinal:  Negative for abdominal pain, diarrhea, nausea and vomiting.  Genitourinary:  Negative for difficulty urinating and dysuria.  Musculoskeletal:  Negative for joint swelling and myalgias.  Skin:  Negative for color change and rash.  Neurological:  Negative for headaches.       No increased dizziness/light headedness.   Hematological:  Negative for adenopathy. Does not bruise/bleed easily.  Psychiatric/Behavioral:  Negative for agitation and dysphoric mood.        Objective:     BP 138/80 (BP Location: Left Arm, Patient Position: Sitting, Cuff Size: Large)   Pulse 80   Temp 98.2 F (36.8 C) (Temporal)   Resp 17   Ht '5\' 8"'$  (1.727 m)   Wt 157 lb 9.6 oz (71.5 kg)   SpO2 98%   BMI 23.96 kg/m  Wt Readings  from Last 3 Encounters:  03/04/22 157 lb 9.6 oz (71.5 kg)  12/22/21 159 lb (72.1 kg)  10/30/21 158 lb 12.8 oz (72 kg)    Physical Exam Constitutional:      General: He is not in acute distress.    Appearance: He is well-developed.  HENT:     Head: Normocephalic and atraumatic.     Nose: Nose normal.     Mouth/Throat:     Pharynx: No oropharyngeal exudate.  Eyes:     General:        Right eye: No discharge.        Left eye: No discharge.     Conjunctiva/sclera: Conjunctivae normal.  Neck:  Thyroid: No thyromegaly.  Cardiovascular:     Rate and Rhythm: Normal rate and regular rhythm.  Pulmonary:     Effort: No respiratory distress.     Breath sounds: Normal breath sounds. No wheezing.  Abdominal:     General: Bowel sounds are normal.     Palpations: Abdomen is soft.     Tenderness: There is no abdominal tenderness.  Genitourinary:    Penis: Normal.      Rectum: Normal.  Musculoskeletal:        General: No tenderness.     Cervical back: Neck supple.  Lymphadenopathy:     Cervical: No cervical adenopathy.  Skin:    General: Skin is warm and dry.     Findings: No rash.  Neurological:     Mental Status: He is alert and oriented to person, place, and time.  Psychiatric:        Behavior: Behavior normal.      Outpatient Encounter Medications as of 03/04/2022  Medication Sig   aspirin EC 81 MG tablet Take 81 mg by mouth daily. Swallow whole.   ELIQUIS 5 MG TABS tablet TAKE 1 TABLET BY MOUTH EVERY 12 HOURS   lamoTRIgine (LAMICTAL) 100 MG tablet Take 100 mg by mouth 2 (two) times daily.   Multiple Vitamin (MULTIVITAMIN) capsule Take by mouth.   nitroGLYCERIN (NITROSTAT) 0.4 MG SL tablet Place 0.4 mg under the tongue every 5 (five) minutes as needed for chest pain.   omeprazole (PRILOSEC) 20 MG capsule TAKE 1 CAPSULE BY MOUTH TWICE DAILY   pravastatin (PRAVACHOL) 80 MG tablet TAKE 1 TABLET BY MOUTH AT BEDTIME   [DISCONTINUED] lisinopril (ZESTRIL) 30 MG tablet TAKE 1  TABLET BY MOUTH DAILY   lisinopril (ZESTRIL) 30 MG tablet Take 1 tablet (30 mg total) by mouth daily.   [DISCONTINUED] lamoTRIgine (LAMICTAL) 25 MG tablet Take 25 mg by mouth daily. (Patient not taking: Reported on 03/04/2022)   No facility-administered encounter medications on file as of 03/04/2022.     Lab Results  Component Value Date   WBC 6.4 12/02/2021   HGB 10.6 (L) 12/02/2021   HCT 33.1 (L) 12/02/2021   PLT 449.0 (H) 12/02/2021   GLUCOSE 94 03/04/2022   CHOL 236 (H) 03/04/2022   TRIG 142.0 03/04/2022   HDL 43.40 03/04/2022   LDLDIRECT 150.0 04/02/2020   LDLCALC 164 (H) 03/04/2022   ALT 26 03/04/2022   AST 25 03/04/2022   NA 134 (L) 03/04/2022   K 4.8 03/04/2022   CL 99 03/04/2022   CREATININE 1.28 03/04/2022   BUN 20 03/04/2022   CO2 25 03/04/2022   TSH 2.06 03/04/2022   PSA 1.33 10/30/2021   INR 1.1 12/23/2019   HGBA1C 6.0 (H) 05/23/2019       Assessment & Plan:   Problem List Items Addressed This Visit     Anemia    Seeing hematology for IDA. Receiving iron infusions.  Seeing GI.  S/p EGD and colonoscopy.  Planning for capsule study.       Aortic occlusion (HCC)    S/p fem fem bypass.  Followed by [facility].  On eliquis.  Continue pravastatin.  Tolerating '80mg'$  dose.        Relevant Medications   lisinopril (ZESTRIL) 30 MG tablet   Atherosclerotic PVD with intermittent claudication (HCC)    S/p fem fem bypass.  Followed by [facility].  On eliquis.  Continue pravastatin.  Tolerating '80mg'$  dose.        Relevant Medications   lisinopril (  ZESTRIL) 30 MG tablet   Essential hypertension, benign    Continue lisinopril.  Follow pressures.  Follow metabolic panel.        Relevant Medications   lisinopril (ZESTRIL) 30 MG tablet   Other Relevant Orders   Basic Metabolic Panel (BMET) (Completed)   GERD (gastroesophageal reflux disease)    No upper symptoms related.  On prilosec.       Health care maintenance    Physical today 03/04/22.  Colonoscopy 04/2021.  Just saw  GI.  Planning capsule study.  PSA 10/30/21 - 1.33.        History of malignant neoplasm of oropharynx    S/p XRT and chemo.  Followed by oncology.       Hypercholesterolemia    Continue pravastatin.  Low cholesterol diet and exercise.  Follow lipid panel and liver function tests.        Relevant Medications   lisinopril (ZESTRIL) 30 MG tablet   Other Relevant Orders   Lipid Profile (Completed)   Hepatic function panel (Completed)   TSH (Completed)   Seizure disorder (HCC)    Continues on lamictal.  No recent seizures.  Follow. Check lamictal level next labs.       Other Visit Diagnoses     Routine general medical examination at a health care facility    -  Primary        Einar Pheasant, MD

## 2022-03-04 NOTE — Assessment & Plan Note (Signed)
S/p XRT and chemo.  Followed by oncology.  

## 2022-03-04 NOTE — Assessment & Plan Note (Signed)
Seeing hematology for IDA. Receiving iron infusions.  Seeing GI.  S/p EGD and colonoscopy.  Planning for capsule study.

## 2022-03-05 ENCOUNTER — Other Ambulatory Visit: Payer: Self-pay

## 2022-03-05 DIAGNOSIS — E871 Hypo-osmolality and hyponatremia: Secondary | ICD-10-CM

## 2022-03-11 DIAGNOSIS — D509 Iron deficiency anemia, unspecified: Secondary | ICD-10-CM | POA: Diagnosis not present

## 2022-03-17 ENCOUNTER — Inpatient Hospital Stay: Payer: Medicare Other | Attending: Oncology

## 2022-03-17 ENCOUNTER — Inpatient Hospital Stay: Payer: Medicare Other | Admitting: Oncology

## 2022-03-17 ENCOUNTER — Encounter: Payer: Self-pay | Admitting: Oncology

## 2022-03-17 VITALS — BP 144/86 | HR 85 | Temp 96.7°F | Resp 18 | Wt 158.4 lb

## 2022-03-17 DIAGNOSIS — I10 Essential (primary) hypertension: Secondary | ICD-10-CM | POA: Diagnosis not present

## 2022-03-17 DIAGNOSIS — D508 Other iron deficiency anemias: Secondary | ICD-10-CM

## 2022-03-17 DIAGNOSIS — E7800 Pure hypercholesterolemia, unspecified: Secondary | ICD-10-CM | POA: Diagnosis not present

## 2022-03-17 DIAGNOSIS — K219 Gastro-esophageal reflux disease without esophagitis: Secondary | ICD-10-CM | POA: Diagnosis not present

## 2022-03-17 DIAGNOSIS — Z85818 Personal history of malignant neoplasm of other sites of lip, oral cavity, and pharynx: Secondary | ICD-10-CM | POA: Insufficient documentation

## 2022-03-17 DIAGNOSIS — Z87891 Personal history of nicotine dependence: Secondary | ICD-10-CM | POA: Diagnosis not present

## 2022-03-17 DIAGNOSIS — Z7901 Long term (current) use of anticoagulants: Secondary | ICD-10-CM | POA: Diagnosis not present

## 2022-03-17 DIAGNOSIS — Z7982 Long term (current) use of aspirin: Secondary | ICD-10-CM | POA: Insufficient documentation

## 2022-03-17 DIAGNOSIS — Z86718 Personal history of other venous thrombosis and embolism: Secondary | ICD-10-CM | POA: Insufficient documentation

## 2022-03-17 DIAGNOSIS — D509 Iron deficiency anemia, unspecified: Secondary | ICD-10-CM | POA: Diagnosis not present

## 2022-03-17 DIAGNOSIS — Z8042 Family history of malignant neoplasm of prostate: Secondary | ICD-10-CM | POA: Diagnosis not present

## 2022-03-17 DIAGNOSIS — Z85828 Personal history of other malignant neoplasm of skin: Secondary | ICD-10-CM | POA: Insufficient documentation

## 2022-03-17 LAB — CBC
HCT: 39.6 % (ref 39.0–52.0)
Hemoglobin: 13.2 g/dL (ref 13.0–17.0)
MCH: 27.6 pg (ref 26.0–34.0)
MCHC: 33.3 g/dL (ref 30.0–36.0)
MCV: 82.8 fL (ref 80.0–100.0)
Platelets: 337 10*3/uL (ref 150–400)
RBC: 4.78 MIL/uL (ref 4.22–5.81)
RDW: 16.2 % — ABNORMAL HIGH (ref 11.5–15.5)
WBC: 7 10*3/uL (ref 4.0–10.5)
nRBC: 0 % (ref 0.0–0.2)

## 2022-03-17 LAB — IRON AND TIBC
Iron: 55 ug/dL (ref 45–182)
Saturation Ratios: 22 % (ref 17.9–39.5)
TIBC: 245 ug/dL — ABNORMAL LOW (ref 250–450)
UIBC: 190 ug/dL

## 2022-03-17 LAB — FERRITIN: Ferritin: 138 ng/mL (ref 24–336)

## 2022-03-17 NOTE — Progress Notes (Signed)
Hematology/Oncology Consult note [facility]  Telephone:([phone] [phone]  Patient Care Team: Einar Pheasant, MD as PCP - General (Internal Medicine)   Name of the patient: Timothy Garrison  [id number]  1961/04/05   Date of visit: 03/17/22  Diagnosis- history of head and neck cancer in remission Iron deficiency anemia  Chief complaint/ Reason for visit-routine follow-up of iron deficiency anemia  Heme/Onc history: Patient has now been referred for his anemia.  His most recent labs from 12/02/2021 showed white cell count of 6.4, H&H of 10.6/33.1 with an MCV of 78 and a platelet count of 449.  Iron studies showed a low ferritin of 8.8 B12 levels were normal at 355.  Overall his hemoglobin is gradually drifted down from 12 a year ago to presently 10.6.  He denies any blood loss in his stool or urine.  Patient underwent EGD and colonoscopy which did not show any active bleeding.  Also underwent capsule endoscopy in July 2023 which did not show any evidence of active bleeding  Interval history-does report improvement in his energy levels after receiving IV iron.  Denies any blood loss in stool or urine  ECOG PS- 1 Pain scale- 0   Review of systems- Review of Systems  Constitutional:  Negative for chills, fever, malaise/fatigue and weight loss.  HENT:  Negative for congestion, ear discharge and nosebleeds.   Eyes:  Negative for blurred vision.  Respiratory:  Negative for cough, hemoptysis, sputum production, shortness of breath and wheezing.   Cardiovascular:  Negative for chest pain, palpitations, orthopnea and claudication.  Gastrointestinal:  Negative for abdominal pain, blood in stool, constipation, diarrhea, heartburn, melena, nausea and vomiting.  Genitourinary:  Negative for dysuria, flank pain, frequency, hematuria and urgency.  Musculoskeletal:  Negative for back pain, joint pain and myalgias.  Skin:  Negative for rash.  Neurological:   Negative for dizziness, tingling, focal weakness, seizures, weakness and headaches.  Endo/Heme/Allergies:  Does not bruise/bleed easily.  Psychiatric/Behavioral:  Negative for depression and suicidal ideas. The patient does not have insomnia.       Allergies  Allergen Reactions   Atorvastatin      Past Medical History:  Diagnosis Date   Basal cell carcinoma (BCC) of back    Basal cell carcinoma (BCC) of nasal sidewall    Dizziness    GERD (gastroesophageal reflux disease)    H/O blood clots    Headache    migraines   Hypercholesterolemia    Hypertension    hx of HBP readings   Metastatic squamous cell carcinoma 2018   bx cervical lymph node   Seizures ([place]) 2021   Tonsillar cancer (HCC)    Vascular abnormality    VTE (venous thromboembolism) 2018   associated with chemotherapy     Past Surgical History:  Procedure Laterality Date   COLONOSCOPY     COLONOSCOPY N/A 05/08/2021   Procedure: COLONOSCOPY;  Surgeon: Annamaria Helling, DO;  Location: [facility] ENDOSCOPY;  Service: Gastroenterology;  Laterality: N/A;   endovascular balloon angioplasty femoral/popliteal artery w/stent Left 11/08/2017   ESOPHAGOGASTRODUODENOSCOPY N/A 05/08/2021   Procedure: ESOPHAGOGASTRODUODENOSCOPY (EGD);  Surgeon: Annamaria Helling, DO;  Location: [facility] ENDOSCOPY;  Service: Gastroenterology;  Laterality: N/A;   ESOPHAGOGASTRODUODENOSCOPY (EGD) WITH PROPOFOL N/A 05/24/2015   Procedure: ESOPHAGOGASTRODUODENOSCOPY (EGD) WITH PROPOFOL;  Surgeon: Manya Silvas, MD;  Location: [facility] ENDOSCOPY;  Service: Endoscopy;  Laterality: N/A;   FEMORAL-FEMORAL BYPASS GRAFT     IR IMAGING GUIDED PORT INSERTION  12/09/2016  LEFT HEART CATH AND CORONARY ANGIOGRAPHY N/A 12/25/2019   Procedure: LEFT HEART CATH AND CORONARY ANGIOGRAPHY;  Surgeon: Corey Skains, MD;  Location: [place] CV LAB;  Service: Cardiovascular;  Laterality: N/A;   SAVORY DILATION N/A 05/24/2015   Procedure: SAVORY DILATION;   Surgeon: Manya Silvas, MD;  Location: [facility] ENDOSCOPY;  Service: Endoscopy;  Laterality: N/A;    Social History   Socioeconomic History   Marital status: Married    Spouse name: Not on file   Number of children: Not on file   Years of education: Not on file   Highest education level: Not on file  Occupational History   Occupation: Dealer  Tobacco Use   Smoking status: Former    Types: Cigarettes    Quit date: 11/11/1996    Years since quitting: 25.3   Smokeless tobacco: Never  Vaping Use   Vaping Use: Never used  Substance and Sexual Activity   Alcohol use: Yes    Comment: occasional   Drug use: No   Sexual activity: Yes  Other Topics Concern   Not on file  Social History Narrative   Not on file   Social Determinants of Health   Financial Resource Strain: Low Risk  (06/20/2021)   Overall Financial Resource Strain (CARDIA)    Difficulty of Paying Living Expenses: Not hard at all  Food Insecurity: No Food Insecurity (05/08/2020)   Hunger Vital Sign    Worried About Running Out of Food in the Last Year: Never true    [place] in the Last Year: Never true  Transportation Needs: No Transportation Needs (06/20/2021)   PRAPARE - Hydrologist (Medical): No    Lack of Transportation (Non-Medical): No  Physical Activity: Insufficiently Active (06/20/2021)   Exercise Vital Sign    Days of Exercise per Week: 4 days    Minutes of Exercise per Session: 20 min  Stress: No Stress Concern Present (06/20/2021)   [place]    Feeling of Stress : Not at all  Social Connections: Unknown (06/20/2021)   Social Connection and Isolation Panel [NHANES]    Frequency of Communication with Friends and Family: More than three times a week    Frequency of Social Gatherings with Friends and Family: More than three times a week    Attends Religious Services: More than 4 times per year    Active  Member of [employer] or Organizations: Not on file    Attends Archivist Meetings: Not on file    Marital Status: Married  Intimate Partner Violence: Not At Risk (06/20/2021)   Humiliation, Afraid, Rape, and Kick questionnaire    Fear of Current or Ex-Partner: No    Emotionally Abused: No    Physically Abused: No    Sexually Abused: No    Family History  Problem Relation Age of Onset   Hyperlipidemia Mother    Heart disease Mother    Hypertension Mother    Alzheimer's disease Mother    Prostate cancer Father    Stroke Father      Current Outpatient Medications:    aspirin EC 81 MG tablet, Take 81 mg by mouth daily. Swallow whole., Disp: , Rfl:    ELIQUIS 5 MG TABS tablet, TAKE 1 TABLET BY MOUTH EVERY 12 HOURS, Disp: 180 tablet, Rfl: 1   lamoTRIgine (LAMICTAL) 100 MG tablet, Take 100 mg by mouth 2 (two) times daily., Disp: ,  Rfl:    lisinopril (ZESTRIL) 30 MG tablet, Take 1 tablet (30 mg total) by mouth daily., Disp: 90 tablet, Rfl: 3   Multiple Vitamin (MULTIVITAMIN) capsule, Take by mouth., Disp: , Rfl:    omeprazole (PRILOSEC) 20 MG capsule, TAKE 1 CAPSULE BY MOUTH TWICE DAILY, Disp: 180 capsule, Rfl: 2   polyethylene glycol-electrolytes (NULYTELY) 420 g solution, Take by mouth., Disp: , Rfl:    pravastatin (PRAVACHOL) 80 MG tablet, TAKE 1 TABLET BY MOUTH AT BEDTIME, Disp: 90 tablet, Rfl: 1   nitroGLYCERIN (NITROSTAT) 0.4 MG SL tablet, Place 0.4 mg under the tongue every 5 (five) minutes as needed for chest pain. (Patient not taking: Reported on 03/17/2022), Disp: , Rfl:   Physical exam:  Vitals:   03/17/22 1128  BP: (!) 144/86  Pulse: 85  Resp: 18  Temp: (!) 96.7 F (35.9 C)  SpO2: 99%  Weight: 158 lb 6.4 oz (71.8 kg)   Physical Exam Cardiovascular:     Rate and Rhythm: Normal rate and regular rhythm.     Heart sounds: Normal heart sounds.  Pulmonary:     Effort: Pulmonary effort is normal.     Breath sounds: Normal breath sounds.  Abdominal:     General:  Bowel sounds are normal.     Palpations: Abdomen is soft.  Skin:    General: Skin is warm and dry.  Neurological:     Mental Status: He is alert and oriented to person, place, and time.         Latest Ref Rng & Units 03/04/2022    8:31 AM  CMP  Glucose 70 - 99 mg/dL 94   BUN 6 - 23 mg/dL 20   Creatinine 0.40 - 1.50 mg/dL 1.28   Sodium 135 - 145 mEq/L 134   Potassium 3.5 - 5.1 mEq/L 4.8   Chloride 96 - 112 mEq/L 99   CO2 19 - 32 mEq/L 25   Calcium 8.4 - 10.5 mg/dL 9.8   Total Protein 6.0 - 8.3 g/dL 7.4   Total Bilirubin 0.2 - 1.2 mg/dL 0.5   Alkaline Phos 39 - 117 U/L 70   AST 0 - 37 U/L 25   ALT 0 - 53 U/L 26       Latest Ref Rng & Units 03/17/2022   10:57 AM  CBC  WBC 4.0 - 10.5 K/uL 7.0   Hemoglobin 13.0 - 17.0 g/dL 13.2   Hematocrit 39.0 - 52.0 % 39.6   Platelets 150 - 400 K/uL 337      Assessment and plan- Patient is a 61 y.o. male here for routine follow-up of iron deficiency anemia  After receiving IV iron patient's hemoglobin is improvedFrom 10.6-13.2.  Ferritin levels are normal at 138 with an iron saturation of 22%.  He does not require any IV iron at this time.  We will repeat CBC ferritin and iron studies in 3 in 6 months and I will see him back in 6 months   Visit Diagnosis 1. Other iron deficiency anemia      Dr. Randa Evens, MD, MPH [facility] at [facility] [phone] 03/17/2022 7:20 PM

## 2022-03-18 DIAGNOSIS — Z85828 Personal history of other malignant neoplasm of skin: Secondary | ICD-10-CM | POA: Diagnosis not present

## 2022-03-18 DIAGNOSIS — L821 Other seborrheic keratosis: Secondary | ICD-10-CM | POA: Diagnosis not present

## 2022-03-18 DIAGNOSIS — D225 Melanocytic nevi of trunk: Secondary | ICD-10-CM | POA: Diagnosis not present

## 2022-03-18 DIAGNOSIS — L578 Other skin changes due to chronic exposure to nonionizing radiation: Secondary | ICD-10-CM | POA: Diagnosis not present

## 2022-03-18 DIAGNOSIS — L570 Actinic keratosis: Secondary | ICD-10-CM | POA: Diagnosis not present

## 2022-03-18 DIAGNOSIS — Z872 Personal history of diseases of the skin and subcutaneous tissue: Secondary | ICD-10-CM | POA: Diagnosis not present

## 2022-03-18 DIAGNOSIS — D485 Neoplasm of uncertain behavior of skin: Secondary | ICD-10-CM | POA: Diagnosis not present

## 2022-03-18 DIAGNOSIS — Z859 Personal history of malignant neoplasm, unspecified: Secondary | ICD-10-CM | POA: Diagnosis not present

## 2022-03-18 DIAGNOSIS — Z85820 Personal history of malignant melanoma of skin: Secondary | ICD-10-CM | POA: Diagnosis not present

## 2022-03-27 ENCOUNTER — Other Ambulatory Visit (INDEPENDENT_AMBULATORY_CARE_PROVIDER_SITE_OTHER): Payer: Medicare Other

## 2022-03-27 DIAGNOSIS — E871 Hypo-osmolality and hyponatremia: Secondary | ICD-10-CM

## 2022-03-27 LAB — SODIUM: Sodium: 137 mEq/L (ref 135–145)

## 2022-04-28 DIAGNOSIS — L988 Other specified disorders of the skin and subcutaneous tissue: Secondary | ICD-10-CM | POA: Diagnosis not present

## 2022-04-28 DIAGNOSIS — C44629 Squamous cell carcinoma of skin of left upper limb, including shoulder: Secondary | ICD-10-CM | POA: Diagnosis not present

## 2022-04-30 ENCOUNTER — Other Ambulatory Visit: Payer: Self-pay | Admitting: Internal Medicine

## 2022-05-12 DIAGNOSIS — D485 Neoplasm of uncertain behavior of skin: Secondary | ICD-10-CM | POA: Diagnosis not present

## 2022-05-12 DIAGNOSIS — Q828 Other specified congenital malformations of skin: Secondary | ICD-10-CM | POA: Diagnosis not present

## 2022-05-28 DIAGNOSIS — I70219 Atherosclerosis of native arteries of extremities with intermittent claudication, unspecified extremity: Secondary | ICD-10-CM | POA: Diagnosis not present

## 2022-05-28 DIAGNOSIS — Z95828 Presence of other vascular implants and grafts: Secondary | ICD-10-CM | POA: Diagnosis not present

## 2022-05-28 DIAGNOSIS — I6523 Occlusion and stenosis of bilateral carotid arteries: Secondary | ICD-10-CM | POA: Diagnosis not present

## 2022-05-28 DIAGNOSIS — I739 Peripheral vascular disease, unspecified: Secondary | ICD-10-CM | POA: Diagnosis not present

## 2022-06-05 ENCOUNTER — Ambulatory Visit (INDEPENDENT_AMBULATORY_CARE_PROVIDER_SITE_OTHER): Payer: Medicare Other | Admitting: Internal Medicine

## 2022-06-05 ENCOUNTER — Encounter: Payer: Self-pay | Admitting: Internal Medicine

## 2022-06-05 VITALS — BP 142/88 | HR 89 | Temp 97.7°F | Resp 17 | Ht 68.0 in | Wt 162.0 lb

## 2022-06-05 DIAGNOSIS — K219 Gastro-esophageal reflux disease without esophagitis: Secondary | ICD-10-CM | POA: Diagnosis not present

## 2022-06-05 DIAGNOSIS — I70219 Atherosclerosis of native arteries of extremities with intermittent claudication, unspecified extremity: Secondary | ICD-10-CM

## 2022-06-05 DIAGNOSIS — D649 Anemia, unspecified: Secondary | ICD-10-CM

## 2022-06-05 DIAGNOSIS — I10 Essential (primary) hypertension: Secondary | ICD-10-CM | POA: Diagnosis not present

## 2022-06-05 DIAGNOSIS — I700 Atherosclerosis of aorta: Secondary | ICD-10-CM | POA: Diagnosis not present

## 2022-06-05 DIAGNOSIS — E7800 Pure hypercholesterolemia, unspecified: Secondary | ICD-10-CM | POA: Diagnosis not present

## 2022-06-05 DIAGNOSIS — Z85819 Personal history of malignant neoplasm of unspecified site of lip, oral cavity, and pharynx: Secondary | ICD-10-CM

## 2022-06-05 DIAGNOSIS — G40909 Epilepsy, unspecified, not intractable, without status epilepticus: Secondary | ICD-10-CM

## 2022-06-05 LAB — LIPID PANEL
Cholesterol: 234 mg/dL — ABNORMAL HIGH (ref 0–200)
HDL: 44.7 mg/dL (ref >39.00)
LDL Cholesterol: 160 mg/dL — ABNORMAL HIGH (ref 0–99)
NonHDL: 189.68
Total CHOL/HDL Ratio: 5
Triglycerides: 149 mg/dL (ref 0.0–149.0)
VLDL: 29.8 mg/dL (ref 0.0–40.0)

## 2022-06-05 LAB — BASIC METABOLIC PANEL
BUN: 22 mg/dL (ref 6–23)
CO2: 25 mEq/L (ref 19–32)
Calcium: 10 mg/dL (ref 8.4–10.5)
Chloride: 100 mEq/L (ref 96–112)
Creatinine, Ser: 1.28 mg/dL (ref 0.40–1.50)
GFR: 60.23 mL/min (ref >60.00)
Glucose, Bld: 95 mg/dL (ref 70–99)
Potassium: 4.8 mEq/L (ref 3.5–5.1)
Sodium: 137 mEq/L (ref 135–145)

## 2022-06-05 LAB — HEPATIC FUNCTION PANEL
ALT: 24 U/L (ref 0–53)
AST: 20 U/L (ref 0–37)
Albumin: 4.6 g/dL (ref 3.5–5.2)
Alkaline Phosphatase: 69 U/L (ref 39–117)
Bilirubin, Direct: 0.1 mg/dL (ref 0.0–0.3)
Total Bilirubin: 0.4 mg/dL (ref 0.2–1.2)
Total Protein: 7.2 g/dL (ref 6.0–8.3)

## 2022-06-05 NOTE — Progress Notes (Signed)
[name], male   DOB: [birth year], 61 y.o.   MRN: [id number]   Subjective:    [name], male    DOB: [birth year], 61 y.o.   MRN: [id number]   Patient here for  Chief Complaint  Patient presents with   Follow-up    3 mth f/u   .   HPI Here to follow up regarding hypercholesterolemia, IDA and hypertension.  Miralax - to keep bowels moving.  Continues on lamotrigine for seizures.  Followed by hematology for IDA. Had capsule endoscopy 02/2022 - normal small intestine with no evidence of small bowels bleeding or lesions.  Had f/u with Dr Janese Banks 03/17/22 - hgb 13.2.  recommended f/u in 6 months.  Followed by vascular surgery - just evaluated 05/28/22 - stable - f/u one year.  No chest pain.  Breathing stable.  No abdominal pain reported.  States blood pressure ok.    Past Medical History:  Diagnosis Date   Basal cell carcinoma (BCC) of back    Basal cell carcinoma (BCC) of nasal sidewall    Dizziness    GERD (gastroesophageal reflux disease)    H/O blood clots    Headache    migraines   Hypercholesterolemia    Hypertension    hx of HBP readings   Metastatic squamous cell carcinoma 2018   bx cervical lymph node   Seizures ([place]) 2021   Tonsillar cancer (HCC)    Vascular abnormality    VTE (venous thromboembolism) 2018   associated with chemotherapy   Past Surgical History:  Procedure Laterality Date   COLONOSCOPY     COLONOSCOPY N/A 05/08/2021   Procedure: COLONOSCOPY;  Surgeon: Annamaria Helling, DO;  Location: [facility] ENDOSCOPY;  Service: Gastroenterology;  Laterality: N/A;   endovascular balloon angioplasty femoral/popliteal artery w/stent Left 11/08/2017   ESOPHAGOGASTRODUODENOSCOPY N/A 05/08/2021   Procedure: ESOPHAGOGASTRODUODENOSCOPY (EGD);  Surgeon: Annamaria Helling, DO;  Location: [facility] ENDOSCOPY;  Service: Gastroenterology;  Laterality: N/A;   ESOPHAGOGASTRODUODENOSCOPY (EGD) WITH PROPOFOL N/A 05/24/2015   Procedure:  ESOPHAGOGASTRODUODENOSCOPY (EGD) WITH PROPOFOL;  Surgeon: Manya Silvas, MD;  Location: [facility] ENDOSCOPY;  Service: Endoscopy;  Laterality: N/A;   FEMORAL-FEMORAL BYPASS GRAFT     IR IMAGING GUIDED PORT INSERTION  12/09/2016   LEFT HEART CATH AND CORONARY ANGIOGRAPHY N/A 12/25/2019   Procedure: LEFT HEART CATH AND CORONARY ANGIOGRAPHY;  Surgeon: Corey Skains, MD;  Location: [place] CV LAB;  Service: Cardiovascular;  Laterality: N/A;   SAVORY DILATION N/A 05/24/2015   Procedure: SAVORY DILATION;  Surgeon: Manya Silvas, MD;  Location: [facility] ENDOSCOPY;  Service: Endoscopy;  Laterality: N/A;   Family History  Problem Relation Age of Onset   Hyperlipidemia Mother    Heart disease Mother    Hypertension Mother    Alzheimer's disease Mother    Prostate cancer Father    Stroke Father    Social History   Socioeconomic History   Marital status: Married    Spouse name: Not on file   Number of children: Not on file   Years of education: Not on file   Highest education level: Not on file  Occupational History   Occupation: Dealer  Tobacco Use   Smoking status: Former    Types: Cigarettes    Quit date: 11/11/1996    Years since quitting: 25.6   Smokeless tobacco: Never  Vaping Use   Vaping Use: Never used  Substance and Sexual Activity   Alcohol use: Yes  Comment: occasional   Drug use: No   Sexual activity: Yes  Other Topics Concern   Not on file  Social History Narrative   Not on file   Social Determinants of Health   Financial Resource Strain: Low Risk  (06/20/2021)   Overall Financial Resource Strain (CARDIA)    Difficulty of Paying Living Expenses: Not hard at all  Food Insecurity: No Food Insecurity (05/08/2020)   Hunger Vital Sign    Worried About Running Out of Food in the Last Year: Never true    Ran Out of Food in the Last Year: Never true  Transportation Needs: No Transportation Needs (06/20/2021)   PRAPARE - Hydrologist  (Medical): No    Lack of Transportation (Non-Medical): No  Physical Activity: Insufficiently Active (06/20/2021)   Exercise Vital Sign    Days of Exercise per Week: 4 days    Minutes of Exercise per Session: 20 min  Stress: No Stress Concern Present (06/20/2021)   [place]    Feeling of Stress : Not at all  Social Connections: Unknown (06/20/2021)   Social Connection and Isolation Panel [NHANES]    Frequency of Communication with Friends and Family: More than three times a week    Frequency of Social Gatherings with Friends and Family: More than three times a week    Attends Religious Services: More than 4 times per year    Active Member of [employer] or Organizations: Not on file    Attends Archivist Meetings: Not on file    Marital Status: Married     Review of Systems  Constitutional:  Negative for appetite change and unexpected weight change.  HENT:  Negative for congestion and sinus pressure.   Respiratory:  Negative for cough, chest tightness and shortness of breath.   Cardiovascular:  Negative for chest pain, palpitations and leg swelling.  Gastrointestinal:  Negative for abdominal pain, diarrhea, nausea and vomiting.  Genitourinary:  Negative for difficulty urinating and dysuria.  Musculoskeletal:  Negative for joint swelling and myalgias.  Skin:  Negative for color change and rash.  Neurological:  Negative for dizziness and headaches.  Psychiatric/Behavioral:  Negative for agitation and dysphoric mood.        Objective:     BP (!) 142/88   Pulse 89   Temp 97.7 F (36.5 C) (Oral)   Resp 17   Ht '5\' 8"'$  (1.727 m)   Wt 162 lb (73.5 kg)   SpO2 98%   BMI 24.63 kg/m  Wt Readings from Last 3 Encounters:  06/05/22 162 lb (73.5 kg)  03/17/22 158 lb 6.4 oz (71.8 kg)  03/04/22 157 lb 9.6 oz (71.5 kg)    Physical Exam Constitutional:      General: He is not in acute distress.    Appearance:  Normal appearance. He is well-developed.  HENT:     Head: Normocephalic and atraumatic.     Right Ear: External ear normal.     Left Ear: External ear normal.  Eyes:     General: No scleral icterus.       Right eye: No discharge.        Left eye: No discharge.  Cardiovascular:     Rate and Rhythm: Normal rate and regular rhythm.  Pulmonary:     Effort: Pulmonary effort is normal. No respiratory distress.     Breath sounds: Normal breath sounds.  Abdominal:     General:  Bowel sounds are normal.     Palpations: Abdomen is soft.     Tenderness: There is no abdominal tenderness.  Musculoskeletal:        General: No swelling or tenderness.     Cervical back: Neck supple. No tenderness.  Lymphadenopathy:     Cervical: No cervical adenopathy.  Skin:    Findings: No erythema or rash.  Neurological:     Mental Status: He is alert.  Psychiatric:        Mood and Affect: Mood normal.        Behavior: Behavior normal.      Outpatient Encounter Medications as of 06/05/2022  Medication Sig   aspirin EC 81 MG tablet Take 81 mg by mouth daily. Swallow whole.   ELIQUIS 5 MG TABS tablet TAKE 1 TABLET BY MOUTH EVERY 12 HOURS   lamoTRIgine (LAMICTAL) 100 MG tablet Take 100 mg by mouth 2 (two) times daily.   lisinopril (ZESTRIL) 30 MG tablet Take 1 tablet (30 mg total) by mouth daily.   Multiple Vitamin (MULTIVITAMIN) capsule Take by mouth.   nitroGLYCERIN (NITROSTAT) 0.4 MG SL tablet Place 0.4 mg under the tongue every 5 (five) minutes as needed for chest pain.   omeprazole (PRILOSEC) 20 MG capsule TAKE 1 CAPSULE BY MOUTH TWICE DAILY   pravastatin (PRAVACHOL) 80 MG tablet TAKE 1 TABLET BY MOUTH AT BEDTIME   [DISCONTINUED] polyethylene glycol-electrolytes (NULYTELY) 420 g solution Take by mouth. (Patient not taking: Reported on 06/05/2022)   No facility-administered encounter medications on file as of 06/05/2022.     Lab Results  Component Value Date   WBC 7.0 03/17/2022   HGB 13.2  03/17/2022   HCT 39.6 03/17/2022   PLT 337 03/17/2022   GLUCOSE 95 06/05/2022   CHOL 234 (H) 06/05/2022   TRIG 149.0 06/05/2022   HDL 44.70 06/05/2022   LDLDIRECT 150.0 04/02/2020   LDLCALC 160 (H) 06/05/2022   ALT 24 06/05/2022   AST 20 06/05/2022   NA 137 06/05/2022   K 4.8 06/05/2022   CL 100 06/05/2022   CREATININE 1.28 06/05/2022   BUN 22 06/05/2022   CO2 25 06/05/2022   TSH 2.06 03/04/2022   PSA 1.33 10/30/2021   INR 1.1 12/23/2019   HGBA1C 6.0 (H) 05/23/2019       Assessment & Plan:   Problem List Items Addressed This Visit     Anemia    Seeing hematology for IDA. Receiving iron infusions.  Seeing GI.  S/p EGD and colonoscopy.  Had capsule endoscopy 02/2022 - normal small intestine with no evidence of small bowels bleeding or lesions.  Had f/u with Dr Janese Banks 03/17/22 - hgb 13.2.  recommended f/u in 6 months.        Aortic occlusion (HCC)    S/p fem fem bypass.  Followed by [facility].  On eliquis.  Continue pravastatin.  Tolerating '80mg'$  dose.        Atherosclerotic PVD with intermittent claudication (HCC)    S/p fem fem bypass.  Followed by [facility].  On eliquis.  Continue pravastatin.  Tolerating '80mg'$  dose.        Essential hypertension, benign - Primary    Continue lisinopril.  Discussed elevation in office.  Desires not to change medication. Follow pressures.  Follow metabolic panel.        Relevant Orders   Basic metabolic panel (Completed)   GERD (gastroesophageal reflux disease)    No upper symptoms related.  On prilosec.  History of malignant neoplasm of oropharynx    S/p XRT and chemo.  Followed by oncology.       Hypercholesterolemia    Continue pravastatin.  Low cholesterol diet and exercise.  Follow lipid panel and liver function tests.        Relevant Orders   Lipid panel (Completed)   Hepatic function panel (Completed)   Seizure disorder ([place])    Continues on lamictal.  No recent seizures.  Follow.         Einar Pheasant, MD

## 2022-06-08 ENCOUNTER — Telehealth: Payer: Self-pay | Admitting: Internal Medicine

## 2022-06-08 NOTE — Telephone Encounter (Signed)
Patient would like to know more about Repatha, He is interested in taking. Patient stated he was speaking to Judson Roch about this medication.

## 2022-06-11 NOTE — Telephone Encounter (Signed)
Lvm for pt to return call.

## 2022-06-14 ENCOUNTER — Encounter: Payer: Self-pay | Admitting: Internal Medicine

## 2022-06-14 NOTE — Assessment & Plan Note (Signed)
Seeing hematology for IDA. Receiving iron infusions.  Seeing GI.  S/p EGD and colonoscopy.  Had capsule endoscopy 02/2022 - normal small intestine with no evidence of small bowels bleeding or lesions.  Had f/u with Dr Janese Banks 03/17/22 - hgb 13.2.  recommended f/u in 6 months.

## 2022-06-14 NOTE — Assessment & Plan Note (Signed)
No upper symptoms related.  On prilosec.

## 2022-06-14 NOTE — Assessment & Plan Note (Signed)
S/p XRT and chemo.  Followed by oncology.

## 2022-06-14 NOTE — Assessment & Plan Note (Signed)
Continues on lamictal.  No recent seizures.  Follow.

## 2022-06-14 NOTE — Assessment & Plan Note (Signed)
S/p fem fem bypass.  Followed by [facility].  On eliquis.  Continue pravastatin.  Tolerating '80mg'$  dose.

## 2022-06-14 NOTE — Assessment & Plan Note (Signed)
Continue lisinopril.  Discussed elevation in office.  Desires not to change medication. Follow pressures.  Follow metabolic panel.

## 2022-06-14 NOTE — Assessment & Plan Note (Signed)
Continue pravastatin.  Low cholesterol diet and exercise.  Follow lipid panel and liver function tests.   

## 2022-06-17 ENCOUNTER — Other Ambulatory Visit: Payer: Medicare Other

## 2022-06-22 ENCOUNTER — Ambulatory Visit (INDEPENDENT_AMBULATORY_CARE_PROVIDER_SITE_OTHER): Payer: Medicare Other

## 2022-06-22 ENCOUNTER — Encounter: Payer: Self-pay | Admitting: Internal Medicine

## 2022-06-22 ENCOUNTER — Other Ambulatory Visit: Payer: Self-pay

## 2022-06-22 VITALS — Ht 68.0 in | Wt 162.0 lb

## 2022-06-22 DIAGNOSIS — Z0000 Encounter for general adult medical examination without abnormal findings: Secondary | ICD-10-CM | POA: Diagnosis not present

## 2022-06-22 MED ORDER — LISINOPRIL 40 MG PO TABS: 40.0000 mg | ORAL_TABLET | Freq: Every day | ORAL | 1 refills | Status: DC

## 2022-06-22 NOTE — Telephone Encounter (Signed)
Reviewed blood pressures.  The blood pressures are running a little higher than goal.  He is currently on lisinopril '30mg'$  q day.  If agreeable, see if will increase lisinopril to '40mg'$  q day.  Need to continue to check and record blood pressures.

## 2022-06-22 NOTE — Patient Instructions (Addendum)
Timothy Garrison , Thank you for taking time to come for your Medicare Wellness Visit. I appreciate your ongoing commitment to your health goals. Please review the following plan we discussed and let me know if I can assist you in the future.   These are the goals we discussed:  Goals       Patient Stated     Lower cholesterol (pt-stated)      Healthy diet. Stay active.        This is a list of the screening recommended for you and due dates:  Health Maintenance  Topic Date Due   Tetanus Vaccine  07/17/2022*   Zoster (Shingles) Vaccine (1 of 2) 09/05/2022*   COVID-19 Vaccine (3 - Pfizer risk series) 09/16/2022*   Flu Shot  11/15/2022*   Medicare Annual Wellness Visit  06/23/2023   Colon Cancer Screening  05/09/2031   Hepatitis C Screening: USPSTF Recommendation to screen - Ages 18-79 yo.  Completed   HIV Screening  Completed   HPV Vaccine  Aged Out  *Topic was postponed. The date shown is not the original due date.   Preventive Care 40-64 Years, Male Preventive care refers to lifestyle choices and visits with your health care provider that can promote health and wellness. What does preventive care include? A yearly physical exam. This is also called an annual well check. Dental exams once or twice a year. Routine eye exams. Ask your health care provider how often you should have your eyes checked. Personal lifestyle choices, including: Daily care of your teeth and gums. Regular physical activity. Eating a healthy diet. Avoiding tobacco and drug use. Limiting alcohol use. Practicing safe sex. Taking low-dose aspirin every day starting at age 65. What happens during an annual well check? The services and screenings done by your health care provider during your annual well check will depend on your age, overall health, lifestyle risk factors, and family history of disease. Counseling  Your health care provider may ask you questions about your: Alcohol use. Tobacco use. Drug  use. Emotional well-being. Home and relationship well-being. Sexual activity. Eating habits. Work and work Statistician. Screening  You may have the following tests or measurements: Height, weight, and BMI. Blood pressure. Lipid and cholesterol levels. These may be checked every 5 years, or more frequently if you are over 7 years old. Skin check. Lung cancer screening. You may have this screening every year starting at age 62 if you have a 30-pack-year history of smoking and currently smoke or have quit within the past 15 years. Fecal occult blood test (FOBT) of the stool. You may have this test every year starting at age 76. Flexible sigmoidoscopy or colonoscopy. You may have a sigmoidoscopy every 5 years or a colonoscopy every 10 years starting at age 65. Prostate cancer screening. Recommendations will vary depending on your family history and other risks. Hepatitis C blood test. Hepatitis B blood test. Sexually transmitted disease (STD) testing. Diabetes screening. This is done by checking your blood sugar (glucose) after you have not eaten for a while (fasting). You may have this done every 1-3 years. Discuss your test results, treatment options, and if necessary, the need for more tests with your health care provider. Vaccines  Your health care provider may recommend certain vaccines, such as: Influenza vaccine. This is recommended every year. Tetanus, diphtheria, and acellular pertussis (Tdap, Td) vaccine. You may need a Td booster every 10 years. Zoster vaccine. You may need this after age 28. Pneumococcal 13-valent conjugate (  PCV13) vaccine. You may need this if you have certain conditions and have not been vaccinated. Pneumococcal polysaccharide (PPSV23) vaccine. You may need one or two doses if you smoke cigarettes or if you have certain conditions. Talk to your health care provider about which screenings and vaccines you need and how often you need them. This information is  not intended to replace advice given to you by your health care provider. Make sure you discuss any questions you have with your health care provider. Document Released: 08/30/2015 Document Revised: 04/22/2016 Document Reviewed: 06/04/2015 Elsevier Interactive Patient Education  2017 [place] Prevention in the Home Falls can cause injuries. They can happen to people of all ages. There are many things you can do to make your home safe and to help prevent falls. What can I do on the outside of my home? Regularly fix the edges of walkways and driveways and fix any cracks. Remove anything that might make you trip as you walk through a door, such as a raised step or threshold. Trim any bushes or trees on the path to your home. Use bright outdoor lighting. Clear any walking paths of anything that might make someone trip, such as rocks or tools. Regularly check to see if handrails are loose or broken. Make sure that both sides of any steps have handrails. Any raised decks and porches should have guardrails on the edges. Have any leaves, snow, or ice cleared regularly. Use sand or salt on walking paths during winter. Clean up any spills in your garage right away. This includes oil or grease spills. What can I do in the bathroom? Use night lights. Install grab bars by the toilet and in the tub and shower. Do not use towel bars as grab bars. Use non-skid mats or decals in the tub or shower. If you need to sit down in the shower, use a plastic, non-slip stool. Keep the floor dry. Clean up any water that spills on the floor as soon as it happens. Remove soap buildup in the tub or shower regularly. Attach bath mats securely with double-sided non-slip rug tape. Do not have throw rugs and other things on the floor that can make you trip. What can I do in the bedroom? Use night lights. Make sure that you have a light by your bed that is easy to reach. Do not use any sheets or blankets that  are too big for your bed. They should not hang down onto the floor. Have a firm chair that has side arms. You can use this for support while you get dressed. Do not have throw rugs and other things on the floor that can make you trip. What can I do in the kitchen? Clean up any spills right away. Avoid walking on wet floors. Keep items that you use a lot in easy-to-reach places. If you need to reach something above you, use a strong step stool that has a grab bar. Keep electrical cords out of the way. Do not use floor polish or wax that makes floors slippery. If you must use wax, use non-skid floor wax. Do not have throw rugs and other things on the floor that can make you trip. What can I do with my stairs? Do not leave any items on the stairs. Make sure that there are handrails on both sides of the stairs and use them. Fix handrails that are broken or loose. Make sure that handrails are as long as the stairways. Check any  carpeting to make sure that it is firmly attached to the stairs. Fix any carpet that is loose or worn. Avoid having throw rugs at the top or bottom of the stairs. If you do have throw rugs, attach them to the floor with carpet tape. Make sure that you have a light switch at the top of the stairs and the bottom of the stairs. If you do not have them, ask someone to add them for you. What else can I do to help prevent falls? Wear shoes that: Do not have high heels. Have rubber bottoms. Are comfortable and fit you well. Are closed at the toe. Do not wear sandals. If you use a stepladder: Make sure that it is fully opened. Do not climb a closed stepladder. Make sure that both sides of the stepladder are locked into place. Ask someone to hold it for you, if possible. Clearly mark and make sure that you can see: Any grab bars or handrails. First and last steps. Where the edge of each step is. Use tools that help you move around (mobility aids) if they are needed. These  include: Canes. Walkers. Scooters. Crutches. Turn on the lights when you go into a dark area. Replace any light bulbs as soon as they burn out. Set up your furniture so you have a clear path. Avoid moving your furniture around. If any of your floors are uneven, fix them. If there are any pets around you, be aware of where they are. Review your medicines with your doctor. Some medicines can make you feel dizzy. This can increase your chance of falling. Ask your doctor what other things that you can do to help prevent falls. This information is not intended to replace advice given to you by your health care provider. Make sure you discuss any questions you have with your health care provider. Document Released: 05/30/2009 Document Revised: 01/09/2016 Document Reviewed: 09/07/2014 Elsevier Interactive Patient Education  2017 [employer].

## 2022-06-22 NOTE — Telephone Encounter (Signed)
Pt advised Agreeable to increase to '40mg'$  Sent to [facility] per pt request

## 2022-06-22 NOTE — Progress Notes (Signed)
Subjective:   Timothy Garrison is a 61 y.o. male who presents for Medicare Annual/Subsequent preventive examination.  Review of Systems    No ROS.  Medicare Wellness Virtual Visit.  Visual/audio telehealth visit, UTA vital signs.   See social history for additional risk factors.   Cardiac Risk Factors include: advanced age (>82mn, >>85women);male gender     Objective:    Today's Vitals   06/22/22 0935  Weight: 162 lb (73.5 kg)  Height: '5\' 8"'$  (1.727 m)   Body mass index is 24.63 kg/m.     06/22/2022    9:39 AM 03/17/2022   11:24 AM 12/22/2021   12:56 PM 06/20/2021    9:26 AM 05/08/2021    7:54 AM 12/24/2019    1:41 PM 12/23/2019    7:38 PM  Advanced Directives  Does Patient Have a Medical Advance Directive? No Yes Yes Yes Yes No;Yes No  Type of Advance Directive  Living will [place]   Does patient want to make changes to medical advance directive?    No - Patient declined  No - Patient declined   Copy of HS[place]ierin Chart?    No - copy requested     Would patient like information on creating a medical advance directive? No - Patient declined No - Patient declined No - Patient declined   No - Patient declined No - Patient declined    Current Medications (verified) Outpatient Encounter Medications as of 06/22/2022  Medication Sig   aspirin EC 81 MG tablet Take 81 mg by mouth daily. Swallow whole.   ELIQUIS 5 MG TABS tablet TAKE 1 TABLET BY MOUTH EVERY 12 HOURS   lamoTRIgine (LAMICTAL) 100 MG tablet Take 100 mg by mouth 2 (two) times daily.   lisinopril (ZESTRIL) 30 MG tablet Take 1 tablet (30 mg total) by mouth daily.   Multiple Vitamin (MULTIVITAMIN) capsule Take by mouth.   nitroGLYCERIN (NITROSTAT) 0.4 MG SL tablet Place 0.4 mg under the tongue every 5 (five) minutes as needed for chest pain.   omeprazole (PRILOSEC) 20 MG capsule TAKE 1 CAPSULE BY MOUTH TWICE  DAILY   pravastatin (PRAVACHOL) 80 MG tablet TAKE 1 TABLET BY MOUTH AT BEDTIME   No facility-administered encounter medications on file as of 06/22/2022.    Allergies (verified) Atorvastatin   History: Past Medical History:  Diagnosis Date   Basal cell carcinoma (BCC) of back    Basal cell carcinoma (BCC) of nasal sidewall    Dizziness    GERD (gastroesophageal reflux disease)    H/O blood clots    Headache    migraines   Hypercholesterolemia    Hypertension    hx of HBP readings   Metastatic squamous cell carcinoma 2018   bx cervical lymph node   Seizures [place]   Tonsillar cancer (HCC)    Vascular abnormality    VTE (venous thromboembolism) 2018   associated with chemotherapy   Past Surgical History:  Procedure Laterality Date   COLONOSCOPY     COLONOSCOPY N/A 05/08/2021   Procedure: COLONOSCOPY;  Surgeon: RAnnamaria Helling DO;  [facility];  Service: Gastroenterology;  Laterality: N/A;   endovascular balloon angioplasty femoral/popliteal artery w/stent Left 11/08/2017   ESOPHAGOGASTRODUODENOSCOPY N/A 05/08/2021   Procedure: ESOPHAGOGASTRODUODENOSCOPY (EGD);  Surgeon: RAnnamaria Helling DO;  [facility];  Service: Gastroenterology;  Laterality: N/A;   ESOPHAGOGASTRODUODENOSCOPY (EGD) WITH PROPOFOL N/A 05/24/2015  Procedure: ESOPHAGOGASTRODUODENOSCOPY (EGD) WITH PROPOFOL;  Surgeon: Manya Silvas, MD;  Location: [facility] END[facility]COPY;  Service: Endoscopy;  Laterality: N/A;   FEMORAL-FEMORAL BYPASS GRAFT     IR IMAGING GUIDED PORT INSERTION  12/09/2016   LEFT HEART CATH AND CORONARY ANGIOGRAPHY N/A 12/25/2019   Procedure: LEFT HEART CATH AND CORONARY ANGIOGRAPHY;  Surgeon: Corey Skains, MD;  Location: [place] CV LAB;  Service: Cardiovascular;  Laterality: N/A;   SAVORY DILATION N/A 05/24/2015   Procedure: SAVORY DILATION;  Surgeon: Manya Silvas, MD;  Location: [facility] ENDOSCOPY;  Service: Endoscopy;  Laterality: N/A;   Family  History  Problem Relation Age of Onset   Hyperlipidemia Mother    Heart disease Mother    Hypertension Mother    Alzheimer's disease Mother    Prostate cancer Father    Stroke Father    Social History   Socioeconomic History   Marital status: Married    Spouse name: Not on file   Number of children: Not on file   Years of education: Not on file   Highest education level: Not on file  Occupational History   Occupation: Dealer  Tobacco Use   Smoking status: Former    Types: Cigarettes    Quit date: 11/11/1996    Years since quitting: 25.6   Smokeless tobacco: Never  Vaping Use   Vaping Use: Never used  Substance and Sexual Activity   Alcohol use: Yes    Comment: occasional   Drug use: No   Sexual activity: Yes  Other Topics Concern   Not on file  Social History Narrative   Not on file   Social Determinants of Health   Financial Resource Strain: Low Risk  (06/22/2022)   Overall Financial Resource Strain (CARDIA)    Difficulty of Paying Living Expenses: Not hard at all  Food Insecurity: No Food Insecurity (06/22/2022)   Hunger Vital Sign    Worried About Running Out of Food in the Last Year: Never true    [place] in the Last Year: Never true  Transportation Needs: No Transportation Needs (06/22/2022)   PRAPARE - Hydrologist (Medical): No    Lack of Transportation (Non-Medical): No  Physical Activity: Insufficiently Active (06/22/2022)   Exercise Vital Sign    Days of Exercise per Week: 4 days    Minutes of Exercise per Session: 30 min  Stress: No Stress Concern Present (06/22/2022)   [place]    Feeling of Stress : Not at all  Social Connections: Unknown (06/22/2022)   Social Connection and Isolation Panel [NHANES]    Frequency of Communication with Friends and Family: More than three times a week    Frequency of Social Gatherings with Friends and Family: More  than three times a week    Attends Religious Services: More than 4 times per year    Active Member of [employer] or Organizations: Not on file    Attends Archivist Meetings: Not on file    Marital Status: Married    Tobacco Counseling Counseling given: Not Answered   Clinical Intake:  Pre-visit preparation completed: Yes        Diabetes: No  How often do you need to have someone help you when you read instructions, pamphlets, or other written materials from your doctor or pharmacy?: 1 - Never   Interpreter Needed?: No      Activities of Daily Living  06/22/2022    9:41 AM  In your present state of health, do you have any difficulty performing the following activities:  Hearing? 0  Vision? 0  Difficulty concentrating or making decisions? 0  Walking or climbing stairs? 0  Dressing or bathing? 0  Doing errands, shopping? 0  Preparing Food and eating ? N  Using the Toilet? N  In the past six months, have you accidently leaked urine? N  Do you have problems with loss of bowel control? N  Managing your Medications? N  Managing your Finances? N  Housekeeping or managing your Housekeeping? N    Patient Care Team: Einar Pheasant, MD as PCP - General (Internal Medicine)  Indicate any recent Medical Services you may have received from other than [facility] providers in the past year (date may be approximate).     Assessment:   This is a routine wellness examination for [place].  I connected with  Virginia Rochester on 06/22/22 by a audio enabled telemedicine application and verified that I am speaking with the correct person using two identifiers.  Patient Location: Home  Provider Location: Office/Clinic  I discussed the limitations of evaluation and management by telemedicine. The patient expressed understanding and agreed to proceed.   Hearing/Vision screen Hearing Screening - Comments:: Patient is able to hear conversational tones without difficulty.  No  issues reported.  Vision Screening - Comments:: Followed by Dr. Gloriann Loan Wears reader glasses They have seen their ophthalmologist in the last 12 months.    Dietary issues and exercise activities discussed: Current Exercise Habits: Home exercise routine, Time (Minutes): 30, Frequency (Times/Week): 4, Weekly Exercise (Minutes/Week): 120, Intensity: Mild   Goals Addressed               This Visit's Progress     Patient Stated     Lower cholesterol (pt-stated)        Healthy diet. Stay active.       Depression Screen    06/22/2022    9:38 AM 03/04/2022    8:01 AM 06/20/2021    9:21 AM 05/08/2020    9:10 AM 04/25/2019    8:16 AM 12/08/2016   11:56 AM 07/02/2016    1:25 PM  PHQ 2/9 Scores  PHQ - 2 Score 0 0 0 0 0 0 0    Fall Risk    06/22/2022    9:41 AM 03/04/2022    8:01 AM 06/20/2021    9:21 AM 02/24/2021    7:27 AM 05/08/2020    9:13 AM  Fall Risk   Falls in the past year? 0 0 0 0 0  Number falls in past yr: 0 0 0 0 0  Injury with Fall? 0 0  0   Risk for fall due to : No Fall Risks No Fall Risks     Follow up Falls evaluation completed Falls evaluation completed Falls evaluation completed Falls evaluation completed Falls evaluation completed    [place]: Home free of loose throw rugs in walkways, pet beds, electrical cords, etc? Yes  Adequate lighting in your home to reduce risk of falls? Yes   ASSISTIVE DEVICES UTILIZED TO PREVENT FALLS: Life alert? No  Use of a cane, walker or w/c? No  Grab bars in the bathroom? Yes  Shower chair or bench in shower? No  Elevated toilet seat or a handicapped toilet? No   TIMED UP AND GO: Was the test performed? No .   Cognitive Function:  06/22/2022    9:43 AM 06/20/2021    9:11 AM 05/08/2020    9:21 AM  6CIT Screen  What Year? 0 points 0 points 0 points  What month? 0 points 0 points 0 points  What time? 0 points 0 points   Count back from 20 0 points 0 points 0 points  Months  in reverse 0 points 0 points 0 points  Repeat phrase 0 points 0 points 0 points  Total Score 0 points 0 points     Immunizations Immunization History  Administered Date(s) Administered   Influenza-Unspecified 06/17/2018   PFIZER(Purple Top)SARS-COV-2 Vaccination 04/23/2020, 05/14/2020   TDAP status: Due, Education has been provided regarding the importance of this vaccine. Advised may receive this vaccine at local pharmacy or Health Dept. Aware to provide a copy of the vaccination record if obtained from local pharmacy or Health Dept. Verbalized acceptance and understanding.  Screening Tests Health Maintenance  Topic Date Due   TETANUS/TDAP  07/17/2022 (Originally 07/02/1980)   Zoster Vaccines- Shingrix (1 of 2) 09/05/2022 (Originally 07/02/1980)   COVID-19 Vaccine (3 - Pfizer risk series) 09/16/2022 (Originally 06/11/2020)   INFLUENZA VACCINE  11/15/2022 (Originally 03/17/2022)   Medicare Annual Wellness (AWV)  06/23/2023   COLONOSCOPY (Pts 45-28yr Insurance coverage will need to be confirmed)  05/09/2031   Hepatitis C Screening  Completed   HIV Screening  Completed   HPV VACCINES  Aged Out    Health Maintenance There are no preventive care reminders to display for this patient.  Lung Cancer Screening: (Low Dose CT Chest recommended if Age 62-80years, 30 pack-year currently smoking OR have quit w/in 15years.) does not qualify.   Hepatitis C Screening: Completed 2020.  Vision Screening: Recommended annual ophthalmology exams for early detection of glaucoma and other disorders of the eye.  Dental Screening: Recommended annual dental exams for proper oral hygiene. Visits every 6 months.   Community Resource Referral / Chronic Care Management: CRR required this visit?  No   CCM required this visit?  No      Plan:     I have personally reviewed and noted the following in the patient's chart:   Medical and social history Use of alcohol, tobacco or illicit drugs  Current  medications and supplements including opioid prescriptions. Patient is not currently taking opioid prescriptions. Functional ability and status Nutritional status Physical activity Advanced directives List of other physicians Hospitalizations, surgeries, and ER visits in previous 12 months Vitals Screenings to include cognitive, depression, and falls Referrals and appointments  In addition, I have reviewed and discussed with patient certain preventive protocols, quality metrics, and best practice recommendations. A written personalized care plan for preventive services as well as general preventive health recommendations were provided to patient.     DLeta Jungling LPN   [id number]

## 2022-06-22 NOTE — Telephone Encounter (Signed)
Called pt x2 lvm x2 for pt to return call.

## 2022-07-15 DIAGNOSIS — H93239 Hyperacusis, unspecified ear: Secondary | ICD-10-CM | POA: Diagnosis not present

## 2022-07-15 DIAGNOSIS — H903 Sensorineural hearing loss, bilateral: Secondary | ICD-10-CM | POA: Diagnosis not present

## 2022-08-02 ENCOUNTER — Other Ambulatory Visit: Payer: Self-pay | Admitting: Internal Medicine

## 2022-08-17 HISTORY — PX: OTHER SURGICAL HISTORY: SHX169

## 2022-09-08 ENCOUNTER — Encounter: Payer: Self-pay | Admitting: Internal Medicine

## 2022-09-08 ENCOUNTER — Ambulatory Visit (INDEPENDENT_AMBULATORY_CARE_PROVIDER_SITE_OTHER): Payer: Medicare Other | Admitting: Internal Medicine

## 2022-09-08 VITALS — BP 128/74 | HR 88 | Temp 97.9°F | Resp 16 | Ht 68.0 in | Wt 161.6 lb

## 2022-09-08 DIAGNOSIS — K219 Gastro-esophageal reflux disease without esophagitis: Secondary | ICD-10-CM | POA: Diagnosis not present

## 2022-09-08 DIAGNOSIS — I70219 Atherosclerosis of native arteries of extremities with intermittent claudication, unspecified extremity: Secondary | ICD-10-CM | POA: Diagnosis not present

## 2022-09-08 DIAGNOSIS — E7800 Pure hypercholesterolemia, unspecified: Secondary | ICD-10-CM | POA: Diagnosis not present

## 2022-09-08 DIAGNOSIS — Z85819 Personal history of malignant neoplasm of unspecified site of lip, oral cavity, and pharynx: Secondary | ICD-10-CM

## 2022-09-08 DIAGNOSIS — D649 Anemia, unspecified: Secondary | ICD-10-CM

## 2022-09-08 DIAGNOSIS — I10 Essential (primary) hypertension: Secondary | ICD-10-CM

## 2022-09-08 DIAGNOSIS — L989 Disorder of the skin and subcutaneous tissue, unspecified: Secondary | ICD-10-CM | POA: Diagnosis not present

## 2022-09-08 DIAGNOSIS — G40909 Epilepsy, unspecified, not intractable, without status epilepticus: Secondary | ICD-10-CM

## 2022-09-08 LAB — HEPATIC FUNCTION PANEL
ALT: 22 U/L (ref 0–53)
AST: 19 U/L (ref 0–37)
Albumin: 4.4 g/dL (ref 3.5–5.2)
Alkaline Phosphatase: 60 U/L (ref 39–117)
Bilirubin, Direct: 0.1 mg/dL (ref 0.0–0.3)
Total Bilirubin: 0.3 mg/dL (ref 0.2–1.2)
Total Protein: 7.3 g/dL (ref 6.0–8.3)

## 2022-09-08 LAB — BASIC METABOLIC PANEL
BUN: 18 mg/dL (ref 6–23)
CO2: 28 mEq/L (ref 19–32)
Calcium: 9.8 mg/dL (ref 8.4–10.5)
Chloride: 99 mEq/L (ref 96–112)
Creatinine, Ser: 1.25 mg/dL (ref 0.40–1.50)
GFR: 61.85 mL/min (ref >60.00)
Glucose, Bld: 100 mg/dL — ABNORMAL HIGH (ref 70–99)
Potassium: 4.7 mEq/L (ref 3.5–5.1)
Sodium: 135 mEq/L (ref 135–145)

## 2022-09-08 LAB — LIPID PANEL
Cholesterol: 256 mg/dL — ABNORMAL HIGH (ref 0–200)
HDL: 43.2 mg/dL (ref >39.00)
NonHDL: 212.82
Total CHOL/HDL Ratio: 6
Triglycerides: 238 mg/dL — ABNORMAL HIGH (ref 0.0–149.0)
VLDL: 47.6 mg/dL — ABNORMAL HIGH (ref 0.0–40.0)

## 2022-09-08 LAB — LDL CHOLESTEROL, DIRECT: Direct LDL: 163 mg/dL

## 2022-09-08 NOTE — Progress Notes (Unsigned)
[name], male   DOB: 1961/05/22, 62 y.o.   MRN: [id number]   Subjective:    [name], male    DOB: 1961-05-16, 62 y.o.   MRN: [id number]   Patient here for  Chief Complaint  Patient presents with   Medical Management of Chronic Issues   .   HPI Here to follow up regarding hypercholesterolemia, IDA and hypertension.  Miralax - to keep bowels moving.  Continues on lamotrigine for seizures.  Followed by hematology for IDA. Had capsule endoscopy 02/2022 - normal small intestine with no evidence of small bowels bleeding or lesions.  Had f/u with Dr Janese Banks 03/17/22 - hgb 13.2.  recommended f/u in 6 months.  Followed by vascular surgery - just evaluated 05/28/22 - stable - f/u one year.  No chest pain.  Breathing stable.  No abdominal pain reported.  Does report feeling his lower abdomen - protrudes more.  No pain.  No change since evaluation by vascular. States blood pressure ok. Increased lisinopril to '40mg'$  q day - 06/2022.  Blood pressure is doing better.  Some increased stress/anxiety.  States he gets something on his mind and is "hard to get off".  Discussed therapist.  Request information.  No depression.    Past Medical History:  Diagnosis Date   Basal cell carcinoma (BCC) of back    Basal cell carcinoma (BCC) of nasal sidewall    Dizziness    GERD (gastroesophageal reflux disease)    H/O blood clots    Headache    migraines   Hypercholesterolemia    Hypertension    hx of HBP readings   Metastatic squamous cell carcinoma 2018   bx cervical lymph node   Seizures ([place]) 2021   Tonsillar cancer (HCC)    Vascular abnormality    VTE (venous thromboembolism) 2018   associated with chemotherapy   Past Surgical History:  Procedure Laterality Date   COLONOSCOPY     COLONOSCOPY N/A 05/08/2021   Procedure: COLONOSCOPY;  Surgeon: Annamaria Helling, DO;  Location: [facility] ENDOSCOPY;  Service: Gastroenterology;  Laterality: N/A;   endovascular balloon  angioplasty femoral/popliteal artery w/stent Left 11/08/2017   ESOPHAGOGASTRODUODENOSCOPY N/A 05/08/2021   Procedure: ESOPHAGOGASTRODUODENOSCOPY (EGD);  Surgeon: Annamaria Helling, DO;  Location: [facility] ENDOSCOPY;  Service: Gastroenterology;  Laterality: N/A;   ESOPHAGOGASTRODUODENOSCOPY (EGD) WITH PROPOFOL N/A 05/24/2015   Procedure: ESOPHAGOGASTRODUODENOSCOPY (EGD) WITH PROPOFOL;  Surgeon: Manya Silvas, MD;  Location: [facility] ENDOSCOPY;  Service: Endoscopy;  Laterality: N/A;   FEMORAL-FEMORAL BYPASS GRAFT     IR IMAGING GUIDED PORT INSERTION  12/09/2016   LEFT HEART CATH AND CORONARY ANGIOGRAPHY N/A 12/25/2019   Procedure: LEFT HEART CATH AND CORONARY ANGIOGRAPHY;  Surgeon: Corey Skains, MD;  Location: [place] CV LAB;  Service: Cardiovascular;  Laterality: N/A;   SAVORY DILATION N/A 05/24/2015   Procedure: SAVORY DILATION;  Surgeon: Manya Silvas, MD;  Location: [facility] ENDOSCOPY;  Service: Endoscopy;  Laterality: N/A;   Family History  Problem Relation Age of Onset   Hyperlipidemia Mother    Heart disease Mother    Hypertension Mother    Alzheimer's disease Mother    Prostate cancer Father    Stroke Father    Social History   Socioeconomic History   Marital status: Married    Spouse name: Not on file   Number of children: Not on file   Years of education: Not on file   Highest education level: Not on file  Occupational  History   Occupation: Dealer  Tobacco Use   Smoking status: Former    Types: Cigarettes    Quit date: 11/11/1996    Years since quitting: 25.8   Smokeless tobacco: Never  Vaping Use   Vaping Use: Never used  Substance and Sexual Activity   Alcohol use: Yes    Comment: occasional   Drug use: No   Sexual activity: Yes  Other Topics Concern   Not on file  Social History Narrative   Not on file   Social Determinants of Health   Financial Resource Strain: Low Risk  (06/22/2022)   Overall Financial Resource Strain (CARDIA)    Difficulty of  Paying Living Expenses: Not hard at all  Food Insecurity: No Food Insecurity (06/22/2022)   Hunger Vital Sign    Worried About Running Out of Food in the Last Year: Never true    [place] in the Last Year: Never true  Transportation Needs: No Transportation Needs (06/22/2022)   PRAPARE - Hydrologist (Medical): No    Lack of Transportation (Non-Medical): No  Physical Activity: Insufficiently Active (06/22/2022)   Exercise Vital Sign    Days of Exercise per Week: 4 days    Minutes of Exercise per Session: 30 min  Stress: No Stress Concern Present (06/22/2022)   [place]    Feeling of Stress : Not at all  Social Connections: Unknown (06/22/2022)   Social Connection and Isolation Panel [NHANES]    Frequency of Communication with Friends and Family: More than three times a week    Frequency of Social Gatherings with Friends and Family: More than three times a week    Attends Religious Services: More than 4 times per year    Active Member of [employer] or Organizations: Not on file    Attends Archivist Meetings: Not on file    Marital Status: Married     Review of Systems  Constitutional:  Negative for appetite change and unexpected weight change.  HENT:  Negative for congestion and sinus pressure.   Respiratory:  Negative for cough, chest tightness and shortness of breath.   Cardiovascular:  Negative for chest pain, palpitations and leg swelling.  Gastrointestinal:  Negative for abdominal pain, diarrhea, nausea and vomiting.  Genitourinary:  Negative for difficulty urinating and dysuria.  Musculoskeletal:  Negative for joint swelling and myalgias.  Skin:  Negative for color change and rash.  Neurological:  Negative for dizziness and headaches.  Psychiatric/Behavioral:  Negative for agitation and dysphoric mood.        Increased stress / anxiety as outlined.        Objective:      BP 128/74   Pulse 88   Temp 97.9 F (36.6 C)   Resp 16   Ht '5\' 8"'$  (1.727 m)   Wt 161 lb 9.6 oz (73.3 kg)   SpO2 99%   BMI 24.57 kg/m  Wt Readings from Last 3 Encounters:  09/08/22 161 lb 9.6 oz (73.3 kg)  06/22/22 162 lb (73.5 kg)  06/05/22 162 lb (73.5 kg)    Physical Exam Constitutional:      General: He is not in acute distress.    Appearance: Normal appearance. He is well-developed.  HENT:     Head: Normocephalic and atraumatic.     Right Ear: External ear normal.     Left Ear: External ear normal.  Eyes:  General: No scleral icterus.       Right eye: No discharge.        Left eye: No discharge.  Cardiovascular:     Rate and Rhythm: Normal rate and regular rhythm.  Pulmonary:     Effort: Pulmonary effort is normal. No respiratory distress.     Breath sounds: Normal breath sounds.  Abdominal:     General: Bowel sounds are normal.     Palpations: Abdomen is soft.     Tenderness: There is no abdominal tenderness.  Musculoskeletal:        General: No swelling or tenderness.     Cervical back: Neck supple. No tenderness.  Lymphadenopathy:     Cervical: No cervical adenopathy.  Skin:    Findings: No erythema or rash.  Neurological:     Mental Status: He is alert.  Psychiatric:        Mood and Affect: Mood normal.        Behavior: Behavior normal.      Outpatient Encounter Medications as of 09/08/2022  Medication Sig   lisinopril (ZESTRIL) 40 MG tablet Take 1 tablet (40 mg total) by mouth daily.   aspirin EC 81 MG tablet Take 81 mg by mouth daily. Swallow whole.   ELIQUIS 5 MG TABS tablet TAKE 1 TABLET BY MOUTH EVERY 12 HOURS   lamoTRIgine (LAMICTAL) 100 MG tablet Take 100 mg by mouth 2 (two) times daily.   Multiple Vitamin (MULTIVITAMIN) capsule Take by mouth.   nitroGLYCERIN (NITROSTAT) 0.4 MG SL tablet Place 0.4 mg under the tongue every 5 (five) minutes as needed for chest pain.   omeprazole (PRILOSEC) 20 MG capsule TAKE 1 CAPSULE BY MOUTH TWICE  DAILY   pravastatin (PRAVACHOL) 80 MG tablet TAKE 1 TABLET BY MOUTH AT BEDTIME   No facility-administered encounter medications on file as of 09/08/2022.     Lab Results  Component Value Date   WBC 7.0 03/17/2022   HGB 13.2 03/17/2022   HCT 39.6 03/17/2022   PLT 337 03/17/2022   GLUCOSE 95 06/05/2022   CHOL 234 (H) 06/05/2022   TRIG 149.0 06/05/2022   HDL 44.70 06/05/2022   LDLDIRECT 150.0 04/02/2020   LDLCALC 160 (H) 06/05/2022   ALT 24 06/05/2022   AST 20 06/05/2022   NA 137 06/05/2022   K 4.8 06/05/2022   CL 100 06/05/2022   CREATININE 1.28 06/05/2022   BUN 22 06/05/2022   CO2 25 06/05/2022   TSH 2.06 03/04/2022   PSA 1.33 10/30/2021   INR 1.1 12/23/2019   HGBA1C 6.0 (H) 05/23/2019       Assessment & Plan:   Problem List Items Addressed This Visit     Essential hypertension, benign - Primary   Relevant Orders   Basic metabolic panel   Hypercholesterolemia   Relevant Orders   Lipid panel   Hepatic function panel    Einar Pheasant, MD

## 2022-09-09 ENCOUNTER — Other Ambulatory Visit: Payer: Self-pay

## 2022-09-09 ENCOUNTER — Encounter: Payer: Self-pay | Admitting: Internal Medicine

## 2022-09-09 DIAGNOSIS — L989 Disorder of the skin and subcutaneous tissue, unspecified: Secondary | ICD-10-CM | POA: Insufficient documentation

## 2022-09-09 MED ORDER — REPATHA SURECLICK 140 MG/ML ~~LOC~~ SOAJ: 140.0000 mg | SUBCUTANEOUS | 2 refills | Status: DC

## 2022-09-09 NOTE — Assessment & Plan Note (Signed)
No upper symptoms related.  On prilosec.

## 2022-09-09 NOTE — Assessment & Plan Note (Signed)
Continues on lamictal.  No recent seizures.  Follow.

## 2022-09-09 NOTE — Assessment & Plan Note (Signed)
S/p XRT and chemo.  Followed by oncology.

## 2022-09-09 NOTE — Assessment & Plan Note (Signed)
S/p fem fem bypass.  Followed by [facility].  On eliquis.  Continue pravastatin.  Tolerating '80mg'$  dose.

## 2022-09-09 NOTE — Assessment & Plan Note (Signed)
Appt with dermatology.

## 2022-09-09 NOTE — Assessment & Plan Note (Signed)
Seeing hematology for IDA. Receiving iron infusions.  Seeing GI.  S/p EGD and colonoscopy.  Had capsule endoscopy 02/2022 - normal small intestine with no evidence of small bowels bleeding or lesions.  Had f/u with Dr Janese Banks 03/17/22 - hgb 13.2.  recommended f/u in 6 months.

## 2022-09-09 NOTE — Assessment & Plan Note (Signed)
Continue pravastatin.  Low cholesterol diet and exercise.  Follow lipid panel and liver function tests.   

## 2022-09-09 NOTE — Assessment & Plan Note (Signed)
Continue lisinopril.  Blood pressure better on '40mg'$ .  Continue to follow pressures.  Follow metabolic panel.  No changes in medication.

## 2022-09-16 ENCOUNTER — Other Ambulatory Visit: Payer: Self-pay

## 2022-09-17 ENCOUNTER — Inpatient Hospital Stay: Payer: Medicare Other | Attending: Oncology

## 2022-09-17 DIAGNOSIS — D509 Iron deficiency anemia, unspecified: Secondary | ICD-10-CM | POA: Insufficient documentation

## 2022-09-17 DIAGNOSIS — Z79899 Other long term (current) drug therapy: Secondary | ICD-10-CM | POA: Insufficient documentation

## 2022-09-17 DIAGNOSIS — I10 Essential (primary) hypertension: Secondary | ICD-10-CM | POA: Insufficient documentation

## 2022-09-17 DIAGNOSIS — Z85828 Personal history of other malignant neoplasm of skin: Secondary | ICD-10-CM | POA: Insufficient documentation

## 2022-09-17 DIAGNOSIS — Z7982 Long term (current) use of aspirin: Secondary | ICD-10-CM | POA: Insufficient documentation

## 2022-09-17 DIAGNOSIS — Z86718 Personal history of other venous thrombosis and embolism: Secondary | ICD-10-CM | POA: Diagnosis not present

## 2022-09-17 DIAGNOSIS — K219 Gastro-esophageal reflux disease without esophagitis: Secondary | ICD-10-CM | POA: Insufficient documentation

## 2022-09-17 DIAGNOSIS — Z8042 Family history of malignant neoplasm of prostate: Secondary | ICD-10-CM | POA: Insufficient documentation

## 2022-09-17 DIAGNOSIS — Z87891 Personal history of nicotine dependence: Secondary | ICD-10-CM | POA: Insufficient documentation

## 2022-09-17 DIAGNOSIS — D508 Other iron deficiency anemias: Secondary | ICD-10-CM

## 2022-09-17 DIAGNOSIS — Z7901 Long term (current) use of anticoagulants: Secondary | ICD-10-CM | POA: Diagnosis not present

## 2022-09-17 DIAGNOSIS — E7800 Pure hypercholesterolemia, unspecified: Secondary | ICD-10-CM | POA: Diagnosis not present

## 2022-09-17 LAB — CBC WITH DIFFERENTIAL/PLATELET
Abs Immature Granulocytes: 0.03 10*3/uL (ref 0.00–0.07)
Basophils Absolute: 0.1 10*3/uL (ref 0.0–0.1)
Basophils Relative: 1 %
Eosinophils Absolute: 0.1 10*3/uL (ref 0.0–0.5)
Eosinophils Relative: 1 %
HCT: 38.5 % — ABNORMAL LOW (ref 39.0–52.0)
Hemoglobin: 13.1 g/dL (ref 13.0–17.0)
Immature Granulocytes: 0 %
Lymphocytes Relative: 23 %
Lymphs Abs: 1.8 10*3/uL (ref 0.7–4.0)
MCH: 27.7 pg (ref 26.0–34.0)
MCHC: 34 g/dL (ref 30.0–36.0)
MCV: 81.4 fL (ref 80.0–100.0)
Monocytes Absolute: 0.7 10*3/uL (ref 0.1–1.0)
Monocytes Relative: 9 %
Neutro Abs: 5.3 10*3/uL (ref 1.7–7.7)
Neutrophils Relative %: 66 %
Platelets: 362 10*3/uL (ref 150–400)
RBC: 4.73 MIL/uL (ref 4.22–5.81)
RDW: 13.2 % (ref 11.5–15.5)
WBC: 8 10*3/uL (ref 4.0–10.5)
nRBC: 0 % (ref 0.0–0.2)

## 2022-09-17 LAB — IRON AND TIBC
Iron: 53 ug/dL (ref 45–182)
Saturation Ratios: 21 % (ref 17.9–39.5)
TIBC: 248 ug/dL — ABNORMAL LOW (ref 250–450)
UIBC: 195 ug/dL

## 2022-09-17 LAB — FERRITIN: Ferritin: 54 ng/mL (ref 24–336)

## 2022-09-18 ENCOUNTER — Encounter: Payer: Self-pay | Admitting: Oncology

## 2022-09-18 ENCOUNTER — Inpatient Hospital Stay (HOSPITAL_BASED_OUTPATIENT_CLINIC_OR_DEPARTMENT_OTHER): Payer: Medicare Other | Admitting: Oncology

## 2022-09-18 DIAGNOSIS — D508 Other iron deficiency anemias: Secondary | ICD-10-CM

## 2022-09-18 NOTE — Progress Notes (Unsigned)
Patient scheduled for virtual visit today to follow up on labs. Patient denies concerns today.

## 2022-09-20 ENCOUNTER — Encounter: Payer: Self-pay | Admitting: Oncology

## 2022-09-20 NOTE — Progress Notes (Signed)
I connected with Timothy Garrison on 09/20/22 at  3:00 PM EST by video enabled telemedicine visit and verified that I am speaking with the correct person using two identifiers.   I discussed the limitations, risks, security and privacy concerns of performing an evaluation and management service by telemedicine and the availability of in-person appointments. I also discussed with the patient that there may be a patient responsible charge related to this service. The patient expressed understanding and agreed to proceed.  Other persons participating in the visit and their role in the encounter:  none  Patient's location:  home Provider's location:  work  Risk analyst Complaint:  routine f/u of iron deficiency anemia  History of present illness: Patient is a 62 yr old male with h/o Stage I HPV positive oropharyngeal cancer in 2018. He only received 2 weekly doses of cisplatin back then following which he developed limb threatenign acute aortic thrombosis and required intervention by vascular surgery. RT was cut short by 1 week back then. PET showed NED in October 2018 after treatment.   Patient has now been referred for his anemia.  His most recent labs from 12/02/2021 showed white cell count of 6.4, H&H of 10.6/33.1 with an MCV of 78 and a platelet count of 449.  Iron studies showed a low ferritin of 8.8 B12 levels were normal at 355.  Overall his hemoglobin is gradually drifted down from 12 a year ago to presently 10.6.  He denies any blood loss in his stool or urine.   Patient underwent EGD and colonoscopy which did not show any active bleeding.  Also underwent capsule endoscopy in July 2023 which did not show any evidence of active bleeding      Interval history denies any blood loss in stool or urine. Doing well overall   Review of Systems  Constitutional:  Negative for chills, fever, malaise/fatigue and weight loss.  HENT:  Negative for congestion, ear discharge and nosebleeds.   Eyes:  Negative for  blurred vision.  Respiratory:  Negative for cough, hemoptysis, sputum production, shortness of breath and wheezing.   Cardiovascular:  Negative for chest pain, palpitations, orthopnea and claudication.  Gastrointestinal:  Negative for abdominal pain, blood in stool, constipation, diarrhea, heartburn, melena, nausea and vomiting.  Genitourinary:  Negative for dysuria, flank pain, frequency, hematuria and urgency.  Musculoskeletal:  Negative for back pain, joint pain and myalgias.  Skin:  Negative for rash.  Neurological:  Negative for dizziness, tingling, focal weakness, seizures, weakness and headaches.  Endo/Heme/Allergies:  Does not bruise/bleed easily.  Psychiatric/Behavioral:  Negative for depression and suicidal ideas. The patient does not have insomnia.     Allergies  Allergen Reactions   Atorvastatin     Past Medical History:  Diagnosis Date   Basal cell carcinoma (BCC) of back    Basal cell carcinoma (BCC) of nasal sidewall    Dizziness    GERD (gastroesophageal reflux disease)    H/O blood clots    Headache    migraines   Hypercholesterolemia    Hypertension    hx of HBP readings   Metastatic squamous cell carcinoma 2018   bx cervical lymph node   Seizures ([place]) 2021   Tonsillar cancer (HCC)    Vascular abnormality    VTE (venous thromboembolism) 2018   associated with chemotherapy    Past Surgical History:  Procedure Laterality Date   COLONOSCOPY     COLONOSCOPY N/A 05/08/2021   Procedure: COLONOSCOPY;  Surgeon: Annamaria Helling, DO;  Location:  [facility] ENDOSCOPY;  Service: Gastroenterology;  Laterality: N/A;   endovascular balloon angioplasty femoral/popliteal artery w/stent Left 11/08/2017   ESOPHAGOGASTRODUODENOSCOPY N/A 05/08/2021   Procedure: ESOPHAGOGASTRODUODENOSCOPY (EGD);  Surgeon: Annamaria Helling, DO;  Location: [facility] ENDOSCOPY;  Service: Gastroenterology;  Laterality: N/A;   ESOPHAGOGASTRODUODENOSCOPY (EGD) WITH PROPOFOL N/A 05/24/2015    Procedure: ESOPHAGOGASTRODUODENOSCOPY (EGD) WITH PROPOFOL;  Surgeon: Manya Silvas, MD;  Location: [facility] ENDOSCOPY;  Service: Endoscopy;  Laterality: N/A;   FEMORAL-FEMORAL BYPASS GRAFT     IR IMAGING GUIDED PORT INSERTION  12/09/2016   LEFT HEART CATH AND CORONARY ANGIOGRAPHY N/A 12/25/2019   Procedure: LEFT HEART CATH AND CORONARY ANGIOGRAPHY;  Surgeon: Corey Skains, MD;  Location: [place] CV LAB;  Service: Cardiovascular;  Laterality: N/A;   SAVORY DILATION N/A 05/24/2015   Procedure: SAVORY DILATION;  Surgeon: Manya Silvas, MD;  Location: [facility] ENDOSCOPY;  Service: Endoscopy;  Laterality: N/A;    Social History   Socioeconomic History   Marital status: Married    Spouse name: Not on file   Number of children: Not on file   Years of education: Not on file   Highest education level: Not on file  Occupational History   Occupation: Dealer  Tobacco Use   Smoking status: Former    Types: Cigarettes    Quit date: 11/11/1996    Years since quitting: 25.8   Smokeless tobacco: Never  Vaping Use   Vaping Use: Never used  Substance and Sexual Activity   Alcohol use: Yes    Comment: occasional   Drug use: No   Sexual activity: Yes  Other Topics Concern   Not on file  Social History Narrative   Not on file   Social Determinants of Health   Financial Resource Strain: Low Risk  (06/22/2022)   Overall Financial Resource Strain (CARDIA)    Difficulty of Paying Living Expenses: Not hard at all  Food Insecurity: No Food Insecurity (06/22/2022)   Hunger Vital Sign    Worried About Running Out of Food in the Last Year: Never true    [place] in the Last Year: Never true  Transportation Needs: No Transportation Needs (06/22/2022)   PRAPARE - Hydrologist (Medical): No    Lack of Transportation (Non-Medical): No  Physical Activity: Insufficiently Active (06/22/2022)   Exercise Vital Sign    Days of Exercise per Week: 4 days    Minutes  of Exercise per Session: 30 min  Stress: No Stress Concern Present (06/22/2022)   [place]    Feeling of Stress : Not at all  Social Connections: Unknown (06/22/2022)   Social Connection and Isolation Panel [NHANES]    Frequency of Communication with Friends and Family: More than three times a week    Frequency of Social Gatherings with Friends and Family: More than three times a week    Attends Religious Services: More than 4 times per year    Active Member of [employer] or Organizations: Not on file    Attends Archivist Meetings: Not on file    Marital Status: Married  Intimate Partner Violence: Not At Risk (06/22/2022)   Humiliation, Afraid, Rape, and Kick questionnaire    Fear of Current or Ex-Partner: No    Emotionally Abused: No    Physically Abused: No    Sexually Abused: No    Family History  Problem Relation Age of Onset   Hyperlipidemia Mother  Heart disease Mother    Hypertension Mother    Alzheimer's disease Mother    Prostate cancer Father    Stroke Father      Current Outpatient Medications:    aspirin EC 81 MG tablet, Take 81 mg by mouth daily. Swallow whole., Disp: , Rfl:    ELIQUIS 5 MG TABS tablet, TAKE 1 TABLET BY MOUTH EVERY 12 HOURS, Disp: 180 tablet, Rfl: 1   Evolocumab (REPATHA SURECLICK) 161 MG/ML SOAJ, Inject 140 mg into the skin every 14 (fourteen) days. (Patient not taking: Reported on 09/18/2022), Disp: 2 mL, Rfl: 2   lamoTRIgine (LAMICTAL) 100 MG tablet, Take 100 mg by mouth 2 (two) times daily., Disp: , Rfl:    lisinopril (ZESTRIL) 40 MG tablet, Take 1 tablet (40 mg total) by mouth daily., Disp: 90 tablet, Rfl: 1   Multiple Vitamin (MULTIVITAMIN) capsule, Take by mouth., Disp: , Rfl:    nitroGLYCERIN (NITROSTAT) 0.4 MG SL tablet, Place 0.4 mg under the tongue every 5 (five) minutes as needed for chest pain., Disp: , Rfl:    omeprazole (PRILOSEC) 20 MG capsule, TAKE 1 CAPSULE BY  MOUTH TWICE DAILY, Disp: 180 capsule, Rfl: 2   pravastatin (PRAVACHOL) 80 MG tablet, TAKE 1 TABLET BY MOUTH AT BEDTIME, Disp: 90 tablet, Rfl: 3  No results found.  No images are attached to the encounter.      Latest Ref Rng & Units 09/08/2022    7:42 AM  CMP  Glucose 70 - 99 mg/dL 100   BUN 6 - 23 mg/dL 18   Creatinine 0.40 - 1.50 mg/dL 1.25   Sodium 135 - 145 mEq/L 135   Potassium 3.5 - 5.1 mEq/L 4.7   Chloride 96 - 112 mEq/L 99   CO2 19 - 32 mEq/L 28   Calcium 8.4 - 10.5 mg/dL 9.8   Total Protein 6.0 - 8.3 g/dL 7.3   Total Bilirubin 0.2 - 1.2 mg/dL 0.3   Alkaline Phos 39 - 117 U/L 60   AST 0 - 37 U/L 19   ALT 0 - 53 U/L 22       Latest Ref Rng & Units 09/17/2022    1:11 PM  CBC  WBC 4.0 - 10.5 K/uL 8.0   Hemoglobin 13.0 - 17.0 g/dL 13.1   Hematocrit 39.0 - 52.0 % 38.5   Platelets 150 - 400 K/uL 362      Observation/objective:appears in no acute distress over video visit today. Breathing is non labored  Assessment and plan: 62 year old male and this is a routine follow-up visit for iron deficiency anemia  He is not presently anemic with hemoglobin of 13.2.  Ferritin levels are normal at 54 with an iron saturation of 21%.  He does not require any IV iron at this time.  He will get CBC ferritin and iron studies checked with Dr. Nicki Reaper in 4 months and I will see him back in 8 months  Follow-up instructions:  I discussed the assessment and treatment plan with the patient. The patient was provided an opportunity to ask questions and all were answered. The patient agreed with the plan and demonstrated an understanding of the instructions.   The patient was advised to call back or seek an in-person evaluation if the symptoms worsen or if the condition fails to improve as anticipated.  I provided 12 minutes of face-to-face video visit time during this encounter, and > 50% was spent counseling as documented under my assessment & plan.  Visit Diagnosis: 1. Other  iron  deficiency anemia     Dr. Randa Evens, MD, MPH [facility] Tel- [phone] 09/20/2022 1:28 PM

## 2022-09-22 DIAGNOSIS — C4441 Basal cell carcinoma of skin of scalp and neck: Secondary | ICD-10-CM | POA: Diagnosis not present

## 2022-09-22 DIAGNOSIS — D485 Neoplasm of uncertain behavior of skin: Secondary | ICD-10-CM | POA: Diagnosis not present

## 2022-09-22 DIAGNOSIS — L821 Other seborrheic keratosis: Secondary | ICD-10-CM | POA: Diagnosis not present

## 2022-09-22 DIAGNOSIS — L578 Other skin changes due to chronic exposure to nonionizing radiation: Secondary | ICD-10-CM | POA: Diagnosis not present

## 2022-09-22 DIAGNOSIS — Z85828 Personal history of other malignant neoplasm of skin: Secondary | ICD-10-CM | POA: Diagnosis not present

## 2022-09-22 DIAGNOSIS — Z859 Personal history of malignant neoplasm, unspecified: Secondary | ICD-10-CM | POA: Diagnosis not present

## 2022-09-22 DIAGNOSIS — C44311 Basal cell carcinoma of skin of nose: Secondary | ICD-10-CM | POA: Diagnosis not present

## 2022-09-22 DIAGNOSIS — L570 Actinic keratosis: Secondary | ICD-10-CM | POA: Diagnosis not present

## 2022-09-22 DIAGNOSIS — Z85820 Personal history of malignant melanoma of skin: Secondary | ICD-10-CM | POA: Diagnosis not present

## 2022-09-24 ENCOUNTER — Ambulatory Visit: Payer: Medicare Other

## 2022-09-24 NOTE — Progress Notes (Signed)
Pt presented for teaching of [place]. I explained everything to pt and demonstrated for him as well. Pt verbalized understanding

## 2022-10-28 DIAGNOSIS — C4491 Basal cell carcinoma of skin, unspecified: Secondary | ICD-10-CM | POA: Diagnosis not present

## 2022-10-28 DIAGNOSIS — L905 Scar conditions and fibrosis of skin: Secondary | ICD-10-CM | POA: Diagnosis not present

## 2022-11-03 DIAGNOSIS — C44311 Basal cell carcinoma of skin of nose: Secondary | ICD-10-CM | POA: Diagnosis not present

## 2022-11-30 ENCOUNTER — Other Ambulatory Visit: Payer: Self-pay | Admitting: Internal Medicine

## 2022-12-30 ENCOUNTER — Other Ambulatory Visit: Payer: Self-pay | Admitting: Internal Medicine

## 2023-01-07 ENCOUNTER — Encounter: Payer: Self-pay | Admitting: Internal Medicine

## 2023-01-07 ENCOUNTER — Ambulatory Visit (INDEPENDENT_AMBULATORY_CARE_PROVIDER_SITE_OTHER): Payer: Medicare Other | Admitting: Internal Medicine

## 2023-01-07 VITALS — BP 128/80 | HR 85 | Temp 97.6°F | Ht 68.0 in | Wt 159.5 lb

## 2023-01-07 DIAGNOSIS — I700 Atherosclerosis of aorta: Secondary | ICD-10-CM

## 2023-01-07 DIAGNOSIS — M79671 Pain in right foot: Secondary | ICD-10-CM | POA: Diagnosis not present

## 2023-01-07 DIAGNOSIS — H9190 Unspecified hearing loss, unspecified ear: Secondary | ICD-10-CM

## 2023-01-07 DIAGNOSIS — I739 Peripheral vascular disease, unspecified: Secondary | ICD-10-CM

## 2023-01-07 DIAGNOSIS — G40909 Epilepsy, unspecified, not intractable, without status epilepticus: Secondary | ICD-10-CM

## 2023-01-07 DIAGNOSIS — I10 Essential (primary) hypertension: Secondary | ICD-10-CM

## 2023-01-07 DIAGNOSIS — M79672 Pain in left foot: Secondary | ICD-10-CM

## 2023-01-07 DIAGNOSIS — K219 Gastro-esophageal reflux disease without esophagitis: Secondary | ICD-10-CM | POA: Diagnosis not present

## 2023-01-07 DIAGNOSIS — D649 Anemia, unspecified: Secondary | ICD-10-CM | POA: Diagnosis not present

## 2023-01-07 DIAGNOSIS — Z85819 Personal history of malignant neoplasm of unspecified site of lip, oral cavity, and pharynx: Secondary | ICD-10-CM

## 2023-01-07 DIAGNOSIS — E7800 Pure hypercholesterolemia, unspecified: Secondary | ICD-10-CM

## 2023-01-07 DIAGNOSIS — I70219 Atherosclerosis of native arteries of extremities with intermittent claudication, unspecified extremity: Secondary | ICD-10-CM

## 2023-01-07 LAB — CBC WITH DIFFERENTIAL/PLATELET
Basophils Absolute: 0 10*3/uL (ref 0.0–0.1)
Basophils Relative: 0.4 % (ref 0.0–3.0)
Eosinophils Absolute: 0.1 10*3/uL (ref 0.0–0.7)
Eosinophils Relative: 1.1 % (ref 0.0–5.0)
HCT: 39.3 % (ref 39.0–52.0)
Hemoglobin: 12.8 g/dL — ABNORMAL LOW (ref 13.0–17.0)
Lymphocytes Relative: 23 % (ref 12.0–46.0)
Lymphs Abs: 1.4 10*3/uL (ref 0.7–4.0)
MCHC: 32.6 g/dL (ref 30.0–36.0)
MCV: 84.2 fl (ref 78.0–100.0)
Monocytes Absolute: 0.7 10*3/uL (ref 0.1–1.0)
Monocytes Relative: 10.8 % (ref 3.0–12.0)
Neutro Abs: 4 10*3/uL (ref 1.4–7.7)
Neutrophils Relative %: 64.7 % (ref 43.0–77.0)
Platelets: 414 10*3/uL — ABNORMAL HIGH (ref 150.0–400.0)
RBC: 4.66 Mil/uL (ref 4.22–5.81)
RDW: 14.2 % (ref 11.5–15.5)
WBC: 6.1 10*3/uL (ref 4.0–10.5)

## 2023-01-07 LAB — IBC + FERRITIN
Ferritin: 31.3 ng/mL (ref 22.0–322.0)
Iron: 68 ug/dL (ref 42–165)
Saturation Ratios: 27.9 % (ref 20.0–50.0)
TIBC: 243.6 ug/dL — ABNORMAL LOW (ref 250.0–450.0)
Transferrin: 174 mg/dL — ABNORMAL LOW (ref 212.0–360.0)

## 2023-01-07 LAB — BASIC METABOLIC PANEL
BUN: 14 mg/dL (ref 6–23)
CO2: 28 mEq/L (ref 19–32)
Calcium: 9.4 mg/dL (ref 8.4–10.5)
Chloride: 101 mEq/L (ref 96–112)
Creatinine, Ser: 1.22 mg/dL (ref 0.40–1.50)
GFR: 63.54 mL/min (ref >60.00)
Glucose, Bld: 101 mg/dL — ABNORMAL HIGH (ref 70–99)
Potassium: 4.8 mEq/L (ref 3.5–5.1)
Sodium: 135 mEq/L (ref 135–145)

## 2023-01-07 LAB — HEPATIC FUNCTION PANEL
ALT: 22 U/L (ref 0–53)
AST: 23 U/L (ref 0–37)
Albumin: 4.2 g/dL (ref 3.5–5.2)
Alkaline Phosphatase: 60 U/L (ref 39–117)
Bilirubin, Direct: 0.1 mg/dL (ref 0.0–0.3)
Total Bilirubin: 0.4 mg/dL (ref 0.2–1.2)
Total Protein: 7.2 g/dL (ref 6.0–8.3)

## 2023-01-07 LAB — LIPID PANEL
Cholesterol: 101 mg/dL (ref 0–200)
HDL: 44.3 mg/dL (ref >39.00)
LDL Cholesterol: 32 mg/dL (ref 0–99)
NonHDL: 56.38
Total CHOL/HDL Ratio: 2
Triglycerides: 121 mg/dL (ref 0.0–149.0)
VLDL: 24.2 mg/dL (ref 0.0–40.0)

## 2023-01-07 MED ORDER — APIXABAN 5 MG PO TABS: 5.0000 mg | ORAL_TABLET | Freq: Two times a day (BID) | ORAL | 1 refills | Status: DC

## 2023-01-07 NOTE — Progress Notes (Signed)
Subjective:    [name], male    DOB: [birth year], 62 y.o.   MRN: [id number]  Patient here for  Chief Complaint  Patient presents with   Follow-up    f/u    HPI Here for f/u appt.  Here to follow up regarding hypercholesterolemia, IDA and hypertension. Continues on lamotrigine for seizures. Followed by hematology for IDA. Had capsule endoscopy 02/2022 - normal small intestine with no evidence of small bowels bleeding or lesions. Had f/u with Dr Smith Robert 09/18/22.  Hgb - 13.2.  ferritin levels wnl.  Did not recommend IV iron at that time.  Recommended f/u cbc and iron studies in 4 months with f/u Dr Smith Robert 8 months.  He tries to stay active.  No chest pain or sob reported.  No cough or congestion.  No abdominal pain or bowel change reported. Blood pressure - per his report - ok.  Mostly 120-130/80s on outside checks.  Has noticed some pain - across the balls of his feet.  Mostly notices if he has been up on his feet a lot.  Does go barefooted.  Some decreased hearing.  Has been checked by Dr Jenne Campus.  Some wax build up.    Past Medical History:  Diagnosis Date   Basal cell carcinoma (BCC) of back    Basal cell carcinoma (BCC) of nasal sidewall    Dizziness    GERD (gastroesophageal reflux disease)    H/O blood clots    Headache    migraines   Hypercholesterolemia    Hypertension    hx of HBP readings   Metastatic squamous cell carcinoma 2018   bx cervical lymph node   Seizures (HCC) 2021   Tonsillar cancer (HCC)    Vascular abnormality    VTE (venous thromboembolism) 2018   associated with chemotherapy   Past Surgical History:  Procedure Laterality Date   COLONOSCOPY     COLONOSCOPY N/A 05/08/2021   Procedure: COLONOSCOPY;  Surgeon: Jaynie Collins, DO;  Location: [facility] ENDOSCOPY;  Service: Gastroenterology;  Laterality: N/A;   endovascular balloon angioplasty femoral/popliteal artery w/stent Left 11/08/2017   ESOPHAGOGASTRODUODENOSCOPY N/A 05/08/2021    Procedure: ESOPHAGOGASTRODUODENOSCOPY (EGD);  Surgeon: Jaynie Collins, DO;  Location[facility] ENDOSCOPY;  Service: Gastroenterology;  Laterality: N/A;   ESOPHAGOGASTRODUODENOSCOPY (EGD) WITH PROPOFOL N/A 05/24/2015   Procedure: ESOPHAGOGASTRODUODENOSCOPY (EGD) WITH PROPOFOL;  Surgeon: Scot Jun, MD;  Location: [facility] ENDOSCOPY;  Service: Endoscopy;  Laterality: N/A;   FEMORAL-FEMORAL BYPASS GRAFT     IR IMAGING GUIDED PORT INSERTION  12/09/2016   LEFT HEART CATH AND CORONARY ANGIOGRAPHY N/A 12/25/2019   Procedure: LEFT HEART CATH AND CORONARY ANGIOGRAPHY;  Surgeon: Lamar Blinks, MD;  Location: [facility] INVASIVE CV LAB;  Service: Cardiovascular;  Laterality: N/A;   SAVORY DILATION N/A 05/24/2015   Procedure: SAVORY DILATION;  Surgeon: Scot Jun, MD;  Location: [facility] ENDOSCOPY;  Service: Endoscopy;  Laterality: N/A;   Family History  Problem Relation Age of Onset   Hyperlipidemia Mother    Heart disease Mother    Hypertension Mother    Alzheimer's disease Mother    Prostate cancer Father    Stroke Father    Social History   Socioeconomic History   Marital status: Married    Spouse name: Not on file   Number of children: Not on file   Years of education: Not on file   Highest education level: Not on file  Occupational History   Occupation: Curator  Tobacco  Use   Smoking status: Former    Types: Cigarettes    Quit date: 11/11/1996    Years since quitting: 26.2   Smokeless tobacco: Never  Vaping Use   Vaping Use: Never used  Substance and Sexual Activity   Alcohol use: Yes    Comment: occasional   Drug use: No   Sexual activity: Yes  Other Topics Concern   Not on file  Social History Narrative   Not on file   Social Determinants of Health   Financial Resource Strain: Low Risk  (06/22/2022)   Overall Financial Resource Strain (CARDIA)    Difficulty of Paying Living Expenses: Not hard at all  Food Insecurity: No Food Insecurity (06/22/2022)   Hunger Vital  Sign    Worried About Running Out of Food in the Last Year: Never true    Ran Out of Food in the Last Year: Never true  Transportation Needs: No Transportation Needs (06/22/2022)   PRAPARE - Administrator, Civil Service (Medical): No    Lack of Transportation (Non-Medical): No  Physical Activity: Insufficiently Active (06/22/2022)   Exercise Vital Sign    Days of Exercise per Week: 4 days    Minutes of Exercise per Session: 30 min  Stress: No Stress Concern Present (06/22/2022)   [employer] of Occupational Health - Occupational Stress Questionnaire    Feeling of Stress : Not at all  Social Connections: Unknown (06/22/2022)   Social Connection and Isolation Panel [NHANES]    Frequency of Communication with Friends and Family: More than three times a week    Frequency of Social Gatherings with Friends and Family: More than three times a week    Attends Religious Services: More than 4 times per year    Active Member of [employer] or Organizations: Not on file    Attends Banker Meetings: Not on file    Marital Status: Married     Review of Systems  Constitutional:  Negative for appetite change and unexpected weight change.  HENT:  Negative for congestion and sinus pressure.        Decreased hearing.  Respiratory:  Negative for cough, chest tightness and shortness of breath.   Cardiovascular:  Negative for chest pain, palpitations and leg swelling.  Gastrointestinal:  Negative for abdominal pain, diarrhea, nausea and vomiting.  Genitourinary:  Negative for difficulty urinating and dysuria.  Musculoskeletal:  Negative for joint swelling and myalgias.       Foot pain as outlined.    Skin:  Negative for color change and rash.  Neurological:  Negative for dizziness and headaches.  Psychiatric/Behavioral:  Negative for agitation and dysphoric mood.        Objective:     BP 128/80   Pulse 85   Temp 97.6 F (36.4 C) (Oral)   Ht 5\' 8"  (1.727 m)   Wt 159 lb  8 oz (72.3 kg)   SpO2 97%   BMI 24.25 kg/m  Wt Readings from Last 3 Encounters:  01/07/23 159 lb 8 oz (72.3 kg)  09/08/22 161 lb 9.6 oz (73.3 kg)  06/22/22 162 lb (73.5 kg)    Physical Exam Vitals reviewed.  Constitutional:      General: He is not in acute distress.    Appearance: Normal appearance. He is well-developed.  HENT:     Head: Normocephalic and atraumatic.     Right Ear: External ear normal.     Left Ear: External ear normal.  Eyes:  General: No scleral icterus.       Right eye: No discharge.        Left eye: No discharge.     Conjunctiva/sclera: Conjunctivae normal.  Cardiovascular:     Rate and Rhythm: Normal rate and regular rhythm.  Pulmonary:     Effort: Pulmonary effort is normal. No respiratory distress.     Breath sounds: Normal breath sounds.  Abdominal:     General: Bowel sounds are normal.     Palpations: Abdomen is soft.     Tenderness: There is no abdominal tenderness.  Musculoskeletal:        General: No swelling or tenderness.     Cervical back: Neck supple. No tenderness.     Comments: Pain - ball of feet.   Lymphadenopathy:     Cervical: No cervical adenopathy.  Skin:    Findings: No erythema or rash.  Neurological:     Mental Status: He is alert.  Psychiatric:        Mood and Affect: Mood normal.        Behavior: Behavior normal.      Outpatient Encounter Medications as of 01/07/2023  Medication Sig   aspirin EC 81 MG tablet Take 81 mg by mouth daily. Swallow whole.   lamoTRIgine (LAMICTAL) 100 MG tablet Take 100 mg by mouth 2 (two) times daily.   lisinopril (ZESTRIL) 40 MG tablet TAKE 1 TABLET BY MOUTH DAILY   Multiple Vitamin (MULTIVITAMIN) capsule Take by mouth.   nitroGLYCERIN (NITROSTAT) 0.4 MG SL tablet Place 0.4 mg under the tongue every 5 (five) minutes as needed for chest pain.   omeprazole (PRILOSEC) 20 MG capsule TAKE 1 CAPSULE BY MOUTH TWICE DAILY   pravastatin (PRAVACHOL) 80 MG tablet TAKE 1 TABLET BY MOUTH AT  BEDTIME   REPATHA SURECLICK 140 MG/ML SOAJ INJECT 140 MG INTO THE SKIN EVERY 14 DAYS   apixaban (ELIQUIS) 5 MG TABS tablet Take 1 tablet (5 mg total) by mouth every 12 (twelve) hours.   [DISCONTINUED] ELIQUIS 5 MG TABS tablet TAKE 1 TABLET BY MOUTH EVERY 12 HOURS   No facility-administered encounter medications on file as of 01/07/2023.     Lab Results  Component Value Date   WBC 6.1 01/07/2023   HGB 12.8 (L) 01/07/2023   HCT 39.3 01/07/2023   PLT 414.0 (H) 01/07/2023   GLUCOSE 101 (H) 01/07/2023   CHOL 101 01/07/2023   TRIG 121.0 01/07/2023   HDL 44.30 01/07/2023   LDLDIRECT 163.0 09/08/2022   LDLCALC 32 01/07/2023   ALT 22 01/07/2023   AST 23 01/07/2023   NA 135 01/07/2023   K 4.8 01/07/2023   CL 101 01/07/2023   CREATININE 1.22 01/07/2023   BUN 14 01/07/2023   CO2 28 01/07/2023   TSH 2.06 03/04/2022   PSA 1.33 10/30/2021   INR 1.1 12/23/2019   HGBA1C 6.0 (H) 05/23/2019    No results found.     Assessment & Plan:  Hypercholesterolemia Assessment & Plan: Continue pravastatin.  Low cholesterol diet and exercise.  Follow lipid panel and liver function tests.    Orders: -     Lipid panel -     Hepatic function panel -     Basic metabolic panel  Anemia, unspecified type Assessment & Plan: Seeing hematology for IDA. Receiving iron infusions.  Seeing GI.  S/p EGD and colonoscopy.  Had capsule endoscopy 02/2022 - normal small intestine with no evidence of small bowel bleeding or lesions.  Did not recommend IV  iron at that time.  Recommended f/u cbc and iron studies in 4 months with f/u Dr Smith Robert 8 months.   Orders: -     CBC with Differential/Platelet -     IBC + Ferritin  Aortic occlusion (HCC) Assessment & Plan: S/p fem fem bypass.  Followed by [facility].  On eliquis.  Continue pravastatin.  Tolerating 80mg  dose.     Atherosclerotic PVD with intermittent claudication (HCC) Assessment & Plan: S/p fem fem bypass.  Followed by [facility].  On eliquis.  Continue pravastatin.   Tolerating 80mg  dose.     Claudication [facility]) Assessment & Plan: Some leg pain with increased exertion.  Stable.  Sits and rest.  Followed by vascular.     Essential hypertension, benign Assessment & Plan: Continue lisinopril.  Blood pressure better on 40mg .  Continue to follow pressures.  Follow metabolic panel.  No changes in medication.    Gastroesophageal reflux disease, unspecified whether esophagitis present Assessment & Plan: No upper symptoms related.  On prilosec.    History of malignant neoplasm of oropharynx Assessment & Plan: S/p XRT and chemo.  Followed by oncology.    Seizure disorder [facility]) Assessment & Plan: Continues on lamictal.  No recent seizures.  Follow.    Hearing loss, unspecified hearing loss type, unspecified laterality Assessment & Plan: Some decreased hearing.  Has been checked by Dr Jenne Campus.  Some wax build up. Debrox as directed. Notify if desires ear irrigation or reevaluation.    Pain in both feet Assessment & Plan: Discussed supports.  Stretches.  Follow.  Will notify me if desires any further intervention.    Other orders -     Apixaban; Take 1 tablet (5 mg total) by mouth every 12 (twelve) hours.  Dispense: 180 tablet; Refill: 1     [place], MD

## 2023-01-07 NOTE — Patient Instructions (Signed)
Debrox - 3-4 drops in each ear daily as needed.

## 2023-01-17 ENCOUNTER — Encounter: Payer: Self-pay | Admitting: Internal Medicine

## 2023-01-17 DIAGNOSIS — M79673 Pain in unspecified foot: Secondary | ICD-10-CM | POA: Insufficient documentation

## 2023-01-17 DIAGNOSIS — H9190 Unspecified hearing loss, unspecified ear: Secondary | ICD-10-CM | POA: Insufficient documentation

## 2023-01-17 NOTE — Assessment & Plan Note (Signed)
Seeing hematology for IDA. Receiving iron infusions.  Seeing GI.  S/p EGD and colonoscopy.  Had capsule endoscopy 02/2022 - normal small intestine with no evidence of small bowel bleeding or lesions.  Did not recommend IV iron at that time.  Recommended f/u cbc and iron studies in 4 months with f/u Dr Smith Robert 8 months.

## 2023-01-17 NOTE — Assessment & Plan Note (Signed)
No upper symptoms related.  On prilosec.  

## 2023-01-17 NOTE — Assessment & Plan Note (Signed)
Some leg pain with increased exertion.  Stable.  Sits and rest.  Followed by vascular.   ?

## 2023-01-17 NOTE — Assessment & Plan Note (Signed)
Continue lisinopril.  Blood pressure better on 40mg.  Continue to follow pressures.  Follow metabolic panel.  No changes in medication.  

## 2023-01-17 NOTE — Assessment & Plan Note (Signed)
Discussed supports.  Stretches.  Follow.  Will notify me if desires any further intervention.

## 2023-01-17 NOTE — Assessment & Plan Note (Signed)
Continues on lamictal.  No recent seizures.  Follow.  

## 2023-01-17 NOTE — Assessment & Plan Note (Signed)
Continue pravastatin.  Low cholesterol diet and exercise.  Follow lipid panel and liver function tests.   

## 2023-01-17 NOTE — Assessment & Plan Note (Addendum)
Some decreased hearing.  Has been checked by Dr Jenne Campus.  Some wax build up. Debrox as directed. Notify if desires ear irrigation or reevaluation.

## 2023-01-17 NOTE — Assessment & Plan Note (Signed)
S/p XRT and chemo.  Followed by oncology.  

## 2023-01-17 NOTE — Assessment & Plan Note (Signed)
S/p fem fem bypass.  Followed by [facility].  On eliquis.  Continue pravastatin.  Tolerating 80mg dose.   

## 2023-01-19 DIAGNOSIS — D485 Neoplasm of uncertain behavior of skin: Secondary | ICD-10-CM | POA: Diagnosis not present

## 2023-01-19 DIAGNOSIS — Z86006 Personal history of melanoma in-situ: Secondary | ICD-10-CM | POA: Diagnosis not present

## 2023-01-19 DIAGNOSIS — D2272 Melanocytic nevi of left lower limb, including hip: Secondary | ICD-10-CM | POA: Diagnosis not present

## 2023-01-19 DIAGNOSIS — Z85828 Personal history of other malignant neoplasm of skin: Secondary | ICD-10-CM | POA: Diagnosis not present

## 2023-01-19 DIAGNOSIS — D0421 Carcinoma in situ of skin of right ear and external auricular canal: Secondary | ICD-10-CM | POA: Diagnosis not present

## 2023-01-19 DIAGNOSIS — L570 Actinic keratosis: Secondary | ICD-10-CM | POA: Diagnosis not present

## 2023-01-19 DIAGNOSIS — D2261 Melanocytic nevi of right upper limb, including shoulder: Secondary | ICD-10-CM | POA: Diagnosis not present

## 2023-01-19 DIAGNOSIS — D225 Melanocytic nevi of trunk: Secondary | ICD-10-CM | POA: Diagnosis not present

## 2023-01-19 DIAGNOSIS — Z85820 Personal history of malignant melanoma of skin: Secondary | ICD-10-CM | POA: Diagnosis not present

## 2023-01-19 DIAGNOSIS — C44319 Basal cell carcinoma of skin of other parts of face: Secondary | ICD-10-CM | POA: Diagnosis not present

## 2023-01-19 DIAGNOSIS — Z08 Encounter for follow-up examination after completed treatment for malignant neoplasm: Secondary | ICD-10-CM | POA: Diagnosis not present

## 2023-01-19 DIAGNOSIS — D2262 Melanocytic nevi of left upper limb, including shoulder: Secondary | ICD-10-CM | POA: Diagnosis not present

## 2023-02-01 DIAGNOSIS — Z8673 Personal history of transient ischemic attack (TIA), and cerebral infarction without residual deficits: Secondary | ICD-10-CM | POA: Diagnosis not present

## 2023-02-01 DIAGNOSIS — R569 Unspecified convulsions: Secondary | ICD-10-CM | POA: Diagnosis not present

## 2023-02-01 DIAGNOSIS — I70219 Atherosclerosis of native arteries of extremities with intermittent claudication, unspecified extremity: Secondary | ICD-10-CM | POA: Diagnosis not present

## 2023-02-01 DIAGNOSIS — Z85819 Personal history of malignant neoplasm of unspecified site of lip, oral cavity, and pharynx: Secondary | ICD-10-CM | POA: Diagnosis not present

## 2023-02-13 DIAGNOSIS — R569 Unspecified convulsions: Secondary | ICD-10-CM | POA: Diagnosis not present

## 2023-02-27 ENCOUNTER — Other Ambulatory Visit (HOSPITAL_COMMUNITY): Payer: Self-pay

## 2023-02-27 ENCOUNTER — Telehealth: Payer: Self-pay | Admitting: Pharmacy Technician

## 2023-02-27 ENCOUNTER — Encounter: Payer: Self-pay | Admitting: Oncology

## 2023-02-27 NOTE — Telephone Encounter (Signed)
Pharmacy Patient Advocate Encounter   Received notification from CoverMyMeds that prior authorization for Repatha SureClick 140MG /ML auto-injectors is required/requested.   Insurance verification completed.   The patient is insured through [facility] .   Per test claim:  PA submitted to [facility] via CoverMyMeds Key/confirmation #/EOC [id number] Status is pending

## 2023-02-28 ENCOUNTER — Other Ambulatory Visit: Payer: Self-pay | Admitting: Internal Medicine

## 2023-03-10 DIAGNOSIS — I10 Essential (primary) hypertension: Secondary | ICD-10-CM | POA: Diagnosis not present

## 2023-03-10 DIAGNOSIS — R5383 Other fatigue: Secondary | ICD-10-CM | POA: Diagnosis not present

## 2023-03-10 DIAGNOSIS — Z888 Allergy status to other drugs, medicaments and biological substances status: Secondary | ICD-10-CM | POA: Diagnosis not present

## 2023-03-10 DIAGNOSIS — Z79899 Other long term (current) drug therapy: Secondary | ICD-10-CM | POA: Diagnosis not present

## 2023-03-10 DIAGNOSIS — I2510 Atherosclerotic heart disease of native coronary artery without angina pectoris: Secondary | ICD-10-CM | POA: Diagnosis not present

## 2023-03-10 DIAGNOSIS — Z1152 Encounter for screening for COVID-19: Secondary | ICD-10-CM | POA: Diagnosis not present

## 2023-03-10 DIAGNOSIS — Z95820 Peripheral vascular angioplasty status with implants and grafts: Secondary | ICD-10-CM | POA: Diagnosis not present

## 2023-03-10 DIAGNOSIS — R079 Chest pain, unspecified: Secondary | ICD-10-CM | POA: Diagnosis not present

## 2023-03-10 DIAGNOSIS — Z87891 Personal history of nicotine dependence: Secondary | ICD-10-CM | POA: Diagnosis not present

## 2023-03-10 DIAGNOSIS — R0789 Other chest pain: Secondary | ICD-10-CM | POA: Diagnosis not present

## 2023-03-10 DIAGNOSIS — Z7901 Long term (current) use of anticoagulants: Secondary | ICD-10-CM | POA: Diagnosis not present

## 2023-03-10 DIAGNOSIS — Z955 Presence of coronary angioplasty implant and graft: Secondary | ICD-10-CM | POA: Diagnosis not present

## 2023-03-10 DIAGNOSIS — I739 Peripheral vascular disease, unspecified: Secondary | ICD-10-CM | POA: Diagnosis not present

## 2023-03-10 DIAGNOSIS — E785 Hyperlipidemia, unspecified: Secondary | ICD-10-CM | POA: Diagnosis not present

## 2023-03-11 DIAGNOSIS — R079 Chest pain, unspecified: Secondary | ICD-10-CM | POA: Diagnosis not present

## 2023-03-17 DIAGNOSIS — R0789 Other chest pain: Secondary | ICD-10-CM | POA: Diagnosis not present

## 2023-03-17 DIAGNOSIS — I10 Essential (primary) hypertension: Secondary | ICD-10-CM | POA: Diagnosis not present

## 2023-03-17 DIAGNOSIS — R079 Chest pain, unspecified: Secondary | ICD-10-CM | POA: Diagnosis not present

## 2023-03-19 ENCOUNTER — Other Ambulatory Visit (HOSPITAL_COMMUNITY): Payer: Self-pay

## 2023-03-19 NOTE — Telephone Encounter (Signed)
Pharmacy Patient Advocate Encounter  Received notification from [facility] that Prior Authorization for Repatha SureClick 140MG /ML auto-injectors  has been APPROVED from 02/27/2023 to 08/17/2023. Ran test claim, Copay is $was filled at patient's pharmacy on 02/28/2023  PA #/Case ID/Reference #: [id number]

## 2023-03-22 DIAGNOSIS — D0421 Carcinoma in situ of skin of right ear and external auricular canal: Secondary | ICD-10-CM | POA: Diagnosis not present

## 2023-03-22 DIAGNOSIS — C44319 Basal cell carcinoma of skin of other parts of face: Secondary | ICD-10-CM | POA: Diagnosis not present

## 2023-03-29 DIAGNOSIS — I700 Atherosclerosis of aorta: Secondary | ICD-10-CM | POA: Diagnosis not present

## 2023-03-29 DIAGNOSIS — I2510 Atherosclerotic heart disease of native coronary artery without angina pectoris: Secondary | ICD-10-CM | POA: Diagnosis not present

## 2023-03-29 DIAGNOSIS — R931 Abnormal findings on diagnostic imaging of heart and coronary circulation: Secondary | ICD-10-CM | POA: Diagnosis not present

## 2023-03-29 DIAGNOSIS — R0789 Other chest pain: Secondary | ICD-10-CM | POA: Diagnosis not present

## 2023-04-07 DIAGNOSIS — I77810 Thoracic aortic ectasia: Secondary | ICD-10-CM | POA: Diagnosis not present

## 2023-04-07 DIAGNOSIS — R0789 Other chest pain: Secondary | ICD-10-CM | POA: Diagnosis not present

## 2023-04-07 DIAGNOSIS — R079 Chest pain, unspecified: Secondary | ICD-10-CM | POA: Diagnosis not present

## 2023-04-07 DIAGNOSIS — R9439 Abnormal result of other cardiovascular function study: Secondary | ICD-10-CM | POA: Diagnosis not present

## 2023-04-07 DIAGNOSIS — I2510 Atherosclerotic heart disease of native coronary artery without angina pectoris: Secondary | ICD-10-CM | POA: Diagnosis not present

## 2023-04-07 DIAGNOSIS — I358 Other nonrheumatic aortic valve disorders: Secondary | ICD-10-CM | POA: Diagnosis not present

## 2023-04-07 DIAGNOSIS — E785 Hyperlipidemia, unspecified: Secondary | ICD-10-CM | POA: Diagnosis not present

## 2023-04-07 DIAGNOSIS — I3481 Nonrheumatic mitral (valve) annulus calcification: Secondary | ICD-10-CM | POA: Diagnosis not present

## 2023-04-07 DIAGNOSIS — I10 Essential (primary) hypertension: Secondary | ICD-10-CM | POA: Diagnosis not present

## 2023-04-07 DIAGNOSIS — I359 Nonrheumatic aortic valve disorder, unspecified: Secondary | ICD-10-CM | POA: Diagnosis not present

## 2023-04-16 DIAGNOSIS — I10 Essential (primary) hypertension: Secondary | ICD-10-CM | POA: Diagnosis not present

## 2023-04-16 DIAGNOSIS — Z7901 Long term (current) use of anticoagulants: Secondary | ICD-10-CM | POA: Diagnosis not present

## 2023-04-16 DIAGNOSIS — Z7982 Long term (current) use of aspirin: Secondary | ICD-10-CM | POA: Diagnosis not present

## 2023-04-16 DIAGNOSIS — Z8249 Family history of ischemic heart disease and other diseases of the circulatory system: Secondary | ICD-10-CM | POA: Diagnosis not present

## 2023-04-16 DIAGNOSIS — E785 Hyperlipidemia, unspecified: Secondary | ICD-10-CM | POA: Diagnosis not present

## 2023-04-16 DIAGNOSIS — I2510 Atherosclerotic heart disease of native coronary artery without angina pectoris: Secondary | ICD-10-CM | POA: Diagnosis not present

## 2023-04-16 DIAGNOSIS — R079 Chest pain, unspecified: Secondary | ICD-10-CM | POA: Diagnosis not present

## 2023-04-16 DIAGNOSIS — I739 Peripheral vascular disease, unspecified: Secondary | ICD-10-CM | POA: Diagnosis not present

## 2023-04-16 DIAGNOSIS — R9439 Abnormal result of other cardiovascular function study: Secondary | ICD-10-CM | POA: Diagnosis not present

## 2023-04-16 DIAGNOSIS — Z87891 Personal history of nicotine dependence: Secondary | ICD-10-CM | POA: Diagnosis not present

## 2023-04-16 DIAGNOSIS — Z79899 Other long term (current) drug therapy: Secondary | ICD-10-CM | POA: Diagnosis not present

## 2023-04-16 DIAGNOSIS — I25110 Atherosclerotic heart disease of native coronary artery with unstable angina pectoris: Secondary | ICD-10-CM | POA: Diagnosis not present

## 2023-04-17 ENCOUNTER — Encounter: Payer: Self-pay | Admitting: Internal Medicine

## 2023-04-17 DIAGNOSIS — I25119 Atherosclerotic heart disease of native coronary artery with unspecified angina pectoris: Secondary | ICD-10-CM | POA: Insufficient documentation

## 2023-04-17 DIAGNOSIS — I2510 Atherosclerotic heart disease of native coronary artery without angina pectoris: Secondary | ICD-10-CM | POA: Insufficient documentation

## 2023-05-06 ENCOUNTER — Encounter: Payer: Medicare Other | Attending: Cardiology | Admitting: *Deleted

## 2023-05-06 DIAGNOSIS — I2089 Other forms of angina pectoris: Secondary | ICD-10-CM

## 2023-05-06 NOTE — Progress Notes (Signed)
Initial phone call completed. Diagnosis can be found in [facility] 8/30. EP Orientation scheduled for Thursday 10/3 at 9:30.

## 2023-05-07 DIAGNOSIS — I10 Essential (primary) hypertension: Secondary | ICD-10-CM | POA: Diagnosis not present

## 2023-05-07 DIAGNOSIS — E785 Hyperlipidemia, unspecified: Secondary | ICD-10-CM | POA: Diagnosis not present

## 2023-05-07 DIAGNOSIS — I25110 Atherosclerotic heart disease of native coronary artery with unstable angina pectoris: Secondary | ICD-10-CM | POA: Diagnosis not present

## 2023-05-07 DIAGNOSIS — I739 Peripheral vascular disease, unspecified: Secondary | ICD-10-CM | POA: Diagnosis not present

## 2023-05-18 DIAGNOSIS — I745 Embolism and thrombosis of iliac artery: Secondary | ICD-10-CM | POA: Diagnosis not present

## 2023-05-18 DIAGNOSIS — I70219 Atherosclerosis of native arteries of extremities with intermittent claudication, unspecified extremity: Secondary | ICD-10-CM | POA: Diagnosis not present

## 2023-05-18 DIAGNOSIS — I6521 Occlusion and stenosis of right carotid artery: Secondary | ICD-10-CM | POA: Diagnosis not present

## 2023-05-18 DIAGNOSIS — I6523 Occlusion and stenosis of bilateral carotid arteries: Secondary | ICD-10-CM | POA: Diagnosis not present

## 2023-05-18 DIAGNOSIS — Z95828 Presence of other vascular implants and grafts: Secondary | ICD-10-CM | POA: Diagnosis not present

## 2023-05-19 ENCOUNTER — Ambulatory Visit: Payer: Medicare Other | Admitting: Oncology

## 2023-05-19 ENCOUNTER — Ambulatory Visit: Payer: Medicare Other

## 2023-05-19 ENCOUNTER — Encounter: Payer: Self-pay | Admitting: Internal Medicine

## 2023-05-19 ENCOUNTER — Inpatient Hospital Stay: Payer: Medicare Other | Attending: Oncology

## 2023-05-19 ENCOUNTER — Encounter: Payer: Self-pay | Admitting: Oncology

## 2023-05-19 ENCOUNTER — Inpatient Hospital Stay: Payer: Medicare Other | Admitting: Oncology

## 2023-05-19 ENCOUNTER — Ambulatory Visit (INDEPENDENT_AMBULATORY_CARE_PROVIDER_SITE_OTHER): Payer: Medicare Other | Admitting: Internal Medicine

## 2023-05-19 ENCOUNTER — Other Ambulatory Visit: Payer: Medicare Other

## 2023-05-19 VITALS — BP 136/88 | HR 91 | Temp 97.9°F | Resp 16 | Ht 68.0 in | Wt 163.8 lb

## 2023-05-19 VITALS — BP 136/88 | HR 90 | Temp 98.3°F | Wt 161.0 lb

## 2023-05-19 DIAGNOSIS — I25119 Atherosclerotic heart disease of native coronary artery with unspecified angina pectoris: Secondary | ICD-10-CM | POA: Diagnosis not present

## 2023-05-19 DIAGNOSIS — E7800 Pure hypercholesterolemia, unspecified: Secondary | ICD-10-CM

## 2023-05-19 DIAGNOSIS — Z85819 Personal history of malignant neoplasm of unspecified site of lip, oral cavity, and pharynx: Secondary | ICD-10-CM | POA: Diagnosis not present

## 2023-05-19 DIAGNOSIS — I70219 Atherosclerosis of native arteries of extremities with intermittent claudication, unspecified extremity: Secondary | ICD-10-CM

## 2023-05-19 DIAGNOSIS — I10 Essential (primary) hypertension: Secondary | ICD-10-CM | POA: Diagnosis not present

## 2023-05-19 DIAGNOSIS — D649 Anemia, unspecified: Secondary | ICD-10-CM | POA: Diagnosis not present

## 2023-05-19 DIAGNOSIS — K219 Gastro-esophageal reflux disease without esophagitis: Secondary | ICD-10-CM | POA: Diagnosis not present

## 2023-05-19 DIAGNOSIS — Z87891 Personal history of nicotine dependence: Secondary | ICD-10-CM | POA: Insufficient documentation

## 2023-05-19 DIAGNOSIS — I700 Atherosclerosis of aorta: Secondary | ICD-10-CM | POA: Diagnosis not present

## 2023-05-19 DIAGNOSIS — D509 Iron deficiency anemia, unspecified: Secondary | ICD-10-CM | POA: Diagnosis not present

## 2023-05-19 DIAGNOSIS — M542 Cervicalgia: Secondary | ICD-10-CM | POA: Diagnosis not present

## 2023-05-19 DIAGNOSIS — Z85818 Personal history of malignant neoplasm of other sites of lip, oral cavity, and pharynx: Secondary | ICD-10-CM | POA: Insufficient documentation

## 2023-05-19 DIAGNOSIS — Z0000 Encounter for general adult medical examination without abnormal findings: Secondary | ICD-10-CM | POA: Diagnosis not present

## 2023-05-19 DIAGNOSIS — F419 Anxiety disorder, unspecified: Secondary | ICD-10-CM | POA: Insufficient documentation

## 2023-05-19 DIAGNOSIS — Z8042 Family history of malignant neoplasm of prostate: Secondary | ICD-10-CM | POA: Diagnosis not present

## 2023-05-19 DIAGNOSIS — G40909 Epilepsy, unspecified, not intractable, without status epilepticus: Secondary | ICD-10-CM

## 2023-05-19 DIAGNOSIS — D508 Other iron deficiency anemias: Secondary | ICD-10-CM

## 2023-05-19 LAB — CBC WITH DIFFERENTIAL/PLATELET
Abs Immature Granulocytes: 0.03 10*3/uL (ref 0.00–0.07)
Basophils Absolute: 0 10*3/uL (ref 0.0–0.1)
Basophils Relative: 0 %
Eosinophils Absolute: 0 10*3/uL (ref 0.0–0.5)
Eosinophils Relative: 0 %
HCT: 41.4 % (ref 39.0–52.0)
Hemoglobin: 13.4 g/dL (ref 13.0–17.0)
Immature Granulocytes: 0 %
Lymphocytes Relative: 20 %
Lymphs Abs: 1.7 10*3/uL (ref 0.7–4.0)
MCH: 26.8 pg (ref 26.0–34.0)
MCHC: 32.4 g/dL (ref 30.0–36.0)
MCV: 82.8 fL (ref 80.0–100.0)
Monocytes Absolute: 0.6 10*3/uL (ref 0.1–1.0)
Monocytes Relative: 8 %
Neutro Abs: 6.1 10*3/uL (ref 1.7–7.7)
Neutrophils Relative %: 72 %
Platelets: 384 10*3/uL (ref 150–400)
RBC: 5 MIL/uL (ref 4.22–5.81)
RDW: 13.9 % (ref 11.5–15.5)
WBC: 8.5 10*3/uL (ref 4.0–10.5)
nRBC: 0 % (ref 0.0–0.2)

## 2023-05-19 LAB — IRON AND TIBC
Iron: 47 ug/dL (ref 45–182)
Saturation Ratios: 15 % — ABNORMAL LOW (ref 17.9–39.5)
TIBC: 315 ug/dL (ref 250–450)
UIBC: 268 ug/dL

## 2023-05-19 LAB — FERRITIN: Ferritin: 15 ng/mL — ABNORMAL LOW (ref 24–336)

## 2023-05-19 MED ORDER — OMEPRAZOLE 20 MG PO CPDR: 20.0000 mg | DELAYED_RELEASE_CAPSULE | Freq: Two times a day (BID) | ORAL | 3 refills | Status: DC

## 2023-05-19 MED ORDER — BUSPIRONE HCL 5 MG PO TABS: 5.0000 mg | ORAL_TABLET | Freq: Every day | ORAL | 1 refills | Status: DC | PRN

## 2023-05-19 MED ORDER — LISINOPRIL 40 MG PO TABS: 40.0000 mg | ORAL_TABLET | Freq: Every day | ORAL | 3 refills | Status: DC

## 2023-05-19 MED ORDER — APIXABAN 5 MG PO TABS: 5.0000 mg | ORAL_TABLET | Freq: Two times a day (BID) | ORAL | 1 refills | Status: DC

## 2023-05-19 NOTE — Assessment & Plan Note (Signed)
Had f/u with cardiology 05/07/23 - f/u CAD and PAD. Reported minimal anginal symptoms.  Recommended continuing aspirin, pravastatin, PCSK9 inhibitor.  Added imdur 30mg  daily.  Did not tolerate.  Reports does not feel he needs.  Denies any chest pain currently. Cardiac rehab starts tomorrow.

## 2023-05-19 NOTE — Assessment & Plan Note (Signed)
Has seen neurosurgery.   MRI -   multilevel degenerative changes most concerning for facet arthropathy. PT.  Still with neck pain.  When increased, associated light headedness.  Continue exercises - PT and vestibular.  Given increased pain and persistent pain, refer to Dr Yves Dill.

## 2023-05-19 NOTE — Assessment & Plan Note (Signed)
No upper symptoms related.  On prilosec.  

## 2023-05-19 NOTE — Assessment & Plan Note (Addendum)
Physical today 05/19/23.  Colonoscopy 04/2021.  PSA 10/30/21 - 1.33.  check psa with next labs

## 2023-05-19 NOTE — Assessment & Plan Note (Signed)
S/p XRT and chemo.  Followed by oncology.  

## 2023-05-19 NOTE — Assessment & Plan Note (Signed)
S/p fem fem bypass.  Followed by [facility].  On eliquis.  Continue pravastatin and repatha.

## 2023-05-19 NOTE — Progress Notes (Signed)
Subjective:    [name], male    DOB: Nov 18, 1960, 62 y.o.   MRN: [id number]  Patient here for  Chief Complaint  Patient presents with   Annual Exam    HPI Here for physical exam. Saw vascular surgery 05/18/23 - s/p right common iliac artery stent placement as well as a righ external iliac to left common femoral artery bypass graft. Stable.  Carotids - moderte right sided disease.  Recommended continuing antiplatelet, statin and anticoagulation.  Plan f/u in one year. Had f/u with cardiology 05/07/23 - f/u CAD and PAD. Reported minimal anginal symptoms.  Recommended continuing aspirin, pravastatin, PCSK9 inhibitor.  Added imdur 30mg  daily.  Did not tolerate.  Reports does not feel he needs.  Denies any chest pain currently. Cardiac rehab starts tomorrow. Saw neurology 02/01/23 - f/u history of seizure.  Recommended tapering lamictal. EEG - per report ok.  Off lamictal. F/u one year. Followed by hematology for IDA. Had capsule endoscopy 02/2022 - normal small intestine with no evidence of small bowels bleeding or lesions. Had f/u with Dr Smith Robert 09/18/22.  Hgb - 13.2.  ferritin levels wnl.  Did not recommend IV iron at that time.  Recommended f/u cbc and iron studies in 4 months with f/u Dr Smith Robert 8 months. Due to get labs today. Reports increased stress with the above testing,etc.  Increased anxiety.  Feels needs something to help with the anxiety.  No depression.  Would like initially to have something to take prn.  No chest pain reported.  Breathing stable.  Bowels moving.  Persistent increased neck pain.  When the neck pain is increased, he will have increased light headedness.  Has seen NSU.  MRI reviewed.  Discussed f/u with physiatry given persistent pain.    Past Medical History:  Diagnosis Date   Basal cell carcinoma (BCC) of back    Basal cell carcinoma (BCC) of nasal sidewall    Dizziness    GERD (gastroesophageal reflux disease)    H/O blood clots    Headache    migraines    Hypercholesterolemia    Hypertension    hx of HBP readings   Metastatic squamous cell carcinoma 2018   bx cervical lymph node   Seizures (HCC) 2021   Tonsillar cancer (HCC)    Vascular abnormality    VTE (venous thromboembolism) 2018   associated with chemotherapy   Past Surgical History:  Procedure Laterality Date   COLONOSCOPY     COLONOSCOPY N/A 05/08/2021   Procedure: COLONOSCOPY;  Surgeon: Jaynie Collins, DO;  Location: [facility] ENDOSCOPY;  Service: Gastroenterology;  Laterality: N/A;   endovascular balloon angioplasty femoral/popliteal artery w/stent Left 11/08/2017   ESOPHAGOGASTRODUODENOSCOPY N/A 05/08/2021   Procedure: ESOPHAGOGASTRODUODENOSCOPY (EGD);  Surgeon: Jaynie Collins, DO;  Location: [facility] ENDOSCOPY;  Service: Gastroenterology;  Laterality: N/A;   ESOPHAGOGASTRODUODENOSCOPY (EGD) WITH PROPOFOL N/A 05/24/2015   Procedure: ESOPHAGOGASTRODUODENOSCOPY (EGD) WITH PROPOFOL;  Surgeon: Scot Jun, MD;  Location: [facility] ENDOSCOPY;  Service: Endoscopy;  Laterality: N/A;   FEMORAL-FEMORAL BYPASS GRAFT     IR IMAGING GUIDED PORT INSERTION  12/09/2016   LEFT HEART CATH AND CORONARY ANGIOGRAPHY N/A 12/25/2019   Procedure: LEFT HEART CATH AND CORONARY ANGIOGRAPHY;  Surgeon: Lamar Blinks, MD;  Location: [facility] INVASIVE CV LAB;  Service: Cardiovascular;  Laterality: N/A;   SAVORY DILATION N/A 05/24/2015   Procedure: SAVORY DILATION;  Surgeon: Scot Jun, MD;  Location: [facility] ENDOSCOPY;  Service: Endoscopy;  Laterality: N/A;   Family  History  Problem Relation Age of Onset   Hyperlipidemia Mother    Heart disease Mother    Hypertension Mother    Alzheimer's disease Mother    Prostate cancer Father    Stroke Father    Social History   Socioeconomic History   Marital status: Married    Spouse name: Not on file   Number of children: Not on file   Years of education: Not on file   Highest education level: Not on file  Occupational History   Occupation:  Curator  Tobacco Use   Smoking status: Former    Current packs/day: 0.00    Types: Cigarettes    Quit date: 11/11/1996    Years since quitting: 26.5   Smokeless tobacco: Never  Vaping Use   Vaping status: Never Used  Substance and Sexual Activity   Alcohol use: Yes    Comment: occasional   Drug use: No   Sexual activity: Yes  Other Topics Concern   Not on file  Social History Narrative   Not on file   Social Determinants of Health   Financial Resource Strain: Low Risk  (06/22/2022)   Overall Financial Resource Strain (CARDIA)    Difficulty of Paying Living Expenses: Not hard at all  Food Insecurity: No Food Insecurity (06/22/2022)   Hunger Vital Sign    Worried About Running Out of Food in the Last Year: Never true    Ran Out of Food in the Last Year: Never true  Transportation Needs: No Transportation Needs (06/22/2022)   PRAPARE - Administrator, Civil Service (Medical): No    Lack of Transportation (Non-Medical): No  Physical Activity: Insufficiently Active (06/22/2022)   Exercise Vital Sign    Days of Exercise per Week: 4 days    Minutes of Exercise per Session: 30 min  Stress: No Stress Concern Present (06/22/2022)   [employer] of Occupational Health - Occupational Stress Questionnaire    Feeling of Stress : Not at all  Social Connections: Unknown (06/22/2022)   Social Connection and Isolation Panel [NHANES]    Frequency of Communication with Friends and Family: More than three times a week    Frequency of Social Gatherings with Friends and Family: More than three times a week    Attends Religious Services: More than 4 times per year    Active Member of [employer] or Organizations: Not on file    Attends Banker Meetings: Not on file    Marital Status: Married     Review of Systems  Constitutional:  Negative for appetite change and unexpected weight change.  HENT:  Negative for congestion and sinus pressure.   Respiratory:  Negative for  cough, chest tightness and shortness of breath.   Cardiovascular:  Negative for chest pain, palpitations and leg swelling.  Gastrointestinal:  Negative for abdominal pain, diarrhea, nausea and vomiting.  Genitourinary:  Negative for difficulty urinating and dysuria.  Musculoskeletal:  Positive for neck pain. Negative for myalgias.  Skin:  Negative for color change and rash.  Neurological:  Positive for light-headedness. Negative for headaches.  Psychiatric/Behavioral:         Increased stress and anxiety as outlined.        Objective:     BP 136/88   Pulse 91   Temp 97.9 F (36.6 C)   Resp 16   Ht 5\' 8"  (1.727 m)   Wt 163 lb 12.8 oz (74.3 kg)   SpO2 98%   BMI 24.91  kg/m  Wt Readings from Last 3 Encounters:  05/19/23 161 lb (73 kg)  05/19/23 163 lb 12.8 oz (74.3 kg)  01/07/23 159 lb 8 oz (72.3 kg)    Physical Exam Vitals reviewed.  Constitutional:      General: He is not in acute distress.    Appearance: Normal appearance. He is well-developed.  HENT:     Head: Normocephalic and atraumatic.     Right Ear: External ear normal.     Left Ear: External ear normal.  Eyes:     General: No scleral icterus.       Right eye: No discharge.        Left eye: No discharge.     Conjunctiva/sclera: Conjunctivae normal.  Cardiovascular:     Rate and Rhythm: Normal rate and regular rhythm.  Pulmonary:     Effort: Pulmonary effort is normal. No respiratory distress.     Breath sounds: Normal breath sounds.  Abdominal:     General: Bowel sounds are normal.     Palpations: Abdomen is soft.     Tenderness: There is no abdominal tenderness.  Musculoskeletal:        General: No swelling or tenderness.     Cervical back: Neck supple. No tenderness.  Lymphadenopathy:     Cervical: No cervical adenopathy.  Skin:    Findings: No erythema or rash.  Neurological:     Mental Status: He is alert.  Psychiatric:        Mood and Affect: Mood normal.        Behavior: Behavior normal.       Outpatient Encounter Medications as of 05/19/2023  Medication Sig   busPIRone (BUSPAR) 5 MG tablet Take 1 tablet (5 mg total) by mouth daily as needed.   pravastatin (PRAVACHOL) 40 MG tablet Take 1 tablet by mouth daily.   apixaban (ELIQUIS) 5 MG TABS tablet Take 1 tablet (5 mg total) by mouth every 12 (twelve) hours.   aspirin EC 81 MG tablet Take 81 mg by mouth daily. Swallow whole.   lisinopril (ZESTRIL) 40 MG tablet Take 1 tablet (40 mg total) by mouth daily.   Multiple Vitamin (MULTIVITAMIN) capsule Take by mouth.   nitroGLYCERIN (NITROSTAT) 0.4 MG SL tablet Place 0.4 mg under the tongue every 5 (five) minutes as needed for chest pain.   omeprazole (PRILOSEC) 20 MG capsule Take 1 capsule (20 mg total) by mouth 2 (two) times daily.   REPATHA SURECLICK 140 MG/ML SOAJ INJECT 140 MG INTO THE SKIN EVERY 14 DAYS   [DISCONTINUED] apixaban (ELIQUIS) 5 MG TABS tablet Take 1 tablet (5 mg total) by mouth every 12 (twelve) hours.   [DISCONTINUED] lamoTRIgine (LAMICTAL) 100 MG tablet Take 100 mg by mouth 2 (two) times daily.   [DISCONTINUED] lisinopril (ZESTRIL) 40 MG tablet TAKE 1 TABLET BY MOUTH DAILY   [DISCONTINUED] omeprazole (PRILOSEC) 20 MG capsule TAKE 1 CAPSULE BY MOUTH TWICE DAILY   [DISCONTINUED] pravastatin (PRAVACHOL) 80 MG tablet TAKE 1 TABLET BY MOUTH AT BEDTIME   No facility-administered encounter medications on file as of 05/19/2023.     Lab Results  Component Value Date   WBC 8.5 05/19/2023   HGB 13.4 05/19/2023   HCT 41.4 05/19/2023   PLT 384 05/19/2023   GLUCOSE 101 (H) 01/07/2023   CHOL 101 01/07/2023   TRIG 121.0 01/07/2023   HDL 44.30 01/07/2023   LDLDIRECT 163.0 09/08/2022   LDLCALC 32 01/07/2023   ALT 22 01/07/2023   AST 23 01/07/2023  NA 135 01/07/2023   K 4.8 01/07/2023   CL 101 01/07/2023   CREATININE 1.22 01/07/2023   BUN 14 01/07/2023   CO2 28 01/07/2023   TSH 2.06 03/04/2022   PSA 1.33 10/30/2021   INR 1.1 12/23/2019   HGBA1C 6.0 (H)  05/23/2019       Assessment & Plan:  Routine general medical examination at a health care facility  Health care maintenance Assessment & Plan: Physical today 05/19/23.  Colonoscopy 04/2021.  PSA 10/30/21 - 1.33.  check psa with next labs    Neck pain Assessment & Plan: Has seen neurosurgery.   MRI -   multilevel degenerative changes most concerning for facet arthropathy. PT.  Still with neck pain.  When increased, associated light headedness.  Continue exercises - PT and vestibular.  Given increased pain and persistent pain, refer to Dr Yves Dill.    Orders: -     Ambulatory referral to Orthopedic Surgery  Anemia, unspecified type Assessment & Plan: Followed by hematology for IDA. Had capsule endoscopy 02/2022 - normal small intestine with no evidence of small bowels bleeding or lesions. Had f/u with Dr Smith Robert 09/18/22.  Hgb - 13.2.  ferritin levels wnl.  Did not recommend IV iron at that time.  Recommended f/u cbc and iron studies in 4 months with f/u Dr Smith Robert 8 months. Due to get labs today.    Aortic occlusion [facility]) Assessment & Plan: S/p fem fem bypass.  Followed by [facility].  On eliquis.  Continue pravastatin and repatha.    Atherosclerotic PVD with intermittent claudication (HCC) Assessment & Plan: S/p fem fem bypass.  Followed by [facility].  On eliquis.  Continue pravastatin and repatha.    Coronary artery disease involving native coronary artery of native heart with angina pectoris [facility]) Assessment & Plan: Had f/u with cardiology 05/07/23 - f/u CAD and PAD. Reported minimal anginal symptoms.  Recommended continuing aspirin, pravastatin, PCSK9 inhibitor.  Added imdur 30mg  daily.  Did not tolerate.  Reports does not feel he needs.  Denies any chest pain currently. Cardiac rehab starts tomorrow.   Essential hypertension, benign Assessment & Plan: Continue lisinopril.  Blood pressure as outlined. Continue to follow pressures.  Follow metabolic panel.  No changes in medication.     Gastroesophageal reflux disease, unspecified whether esophagitis present Assessment & Plan: No upper symptoms related.  On prilosec.    History of malignant neoplasm of oropharynx Assessment & Plan: S/p XRT and chemo.  Followed by oncology.    Hypercholesterolemia Assessment & Plan: Continue pravastatin and repatha. Low cholesterol diet and exercise.  Follow lipid panel and liver function tests.     Seizure disorder [facility]) Assessment & Plan: No recent seizures.  Saw neurology.  S/p EEG.  Tapered off lamictal. Follow.    Anxiety Assessment & Plan: Discussed increased anxiety and stress.  Discussed treatment options.  Discussed counseling.  Wants to hold on counseling.  Does feel needs something to help level things out.  Trial of buspar as directed.  Follow.    Other orders -     Apixaban; Take 1 tablet (5 mg total) by mouth every 12 (twelve) hours.  Dispense: 180 tablet; Refill: 1 -     Lisinopril; Take 1 tablet (40 mg total) by mouth daily.  Dispense: 90 tablet; Refill: 3 -     Omeprazole; Take 1 capsule (20 mg total) by mouth 2 (two) times daily.  Dispense: 180 capsule; Refill: 3 -     busPIRone HCl; Take 1 tablet (5  mg total) by mouth daily as needed.  Dispense: 30 tablet; Refill: 1     Dale [place], MD

## 2023-05-19 NOTE — Assessment & Plan Note (Signed)
Followed by hematology for IDA. Had capsule endoscopy 02/2022 - normal small intestine with no evidence of small bowels bleeding or lesions. Had f/u with Dr Smith Robert 09/18/22.  Hgb - 13.2.  ferritin levels wnl.  Did not recommend IV iron at that time.  Recommended f/u cbc and iron studies in 4 months with f/u Dr Smith Robert 8 months. Due to get labs today.

## 2023-05-19 NOTE — Assessment & Plan Note (Signed)
No recent seizures.  Saw neurology.  S/p EEG.  Tapered off lamictal. Follow.

## 2023-05-19 NOTE — Assessment & Plan Note (Signed)
Continue pravastatin and repatha. Low cholesterol diet and exercise.  Follow lipid panel and liver function tests.

## 2023-05-19 NOTE — Assessment & Plan Note (Signed)
Continue lisinopril.  Blood pressure as outlined. Continue to follow pressures.  Follow metabolic panel.  No changes in medication.

## 2023-05-19 NOTE — Assessment & Plan Note (Signed)
Discussed increased anxiety and stress.  Discussed treatment options.  Discussed counseling.  Wants to hold on counseling.  Does feel needs something to help level things out.  Trial of buspar as directed.  Follow.

## 2023-05-19 NOTE — Progress Notes (Signed)
Hematology/Oncology Consult note [facility]  Telephone:([phone] [phone]  Patient Care Team: Dale Maxton, MD as PCP - General (Internal Medicine)   Name of the patient: Timothy Garrison  [id number]  Jan 24, 1961   Date of visit: 05/19/23  Diagnosis-history of a neck cancer History of iron deficiency anemia  Chief complaint/ Reason for visit-routine follow-up of iron deficiency anemia  Heme/Onc history:  Patient is a 62  yr old male with h/o Stage I HPV positive oropharyngeal cancer in 2018. He only received 2 weekly doses of cisplatin back then following which he developed limb threatenign acute aortic thrombosis and required intervention by vascular surgery. RT was cut short by 1 week back then. PET showed NED in October 2018 after treatment.    Patient has now been referred for his anemia.  His most recent labs from 12/02/2021 showed white cell count of 6.4, H&H of 10.6/33.1 with an MCV of 78 and a platelet count of 449.  Iron studies showed a low ferritin of 8.8 B12 levels were normal at 355.  Overall his hemoglobin is gradually drifted down from 12 a year ago to presently 10.6.  He denies any blood loss in his stool or urine.   Patient underwent EGD and colonoscopy which did not show any active bleeding.  Also underwent capsule endoscopy in July 2023 which did not show any evidence of active bleeding  Interval history-he follows up with vascular surgery for his prior history of aortic thrombosis.  He was also found to have carotid stenosis which may require intervention in the future.  Otherwise he is doing well and denies any specific complaints at this time  ECOG PS- 1 Pain scale- 0   Review of systems- Review of Systems  Constitutional:  Negative for chills, fever, malaise/fatigue and weight loss.  HENT:  Negative for congestion, ear discharge and nosebleeds.   Eyes:  Negative for blurred vision.  Respiratory:  Negative for cough,  hemoptysis, sputum production, shortness of breath and wheezing.   Cardiovascular:  Negative for chest pain, palpitations, orthopnea and claudication.  Gastrointestinal:  Negative for abdominal pain, blood in stool, constipation, diarrhea, heartburn, melena, nausea and vomiting.  Genitourinary:  Negative for dysuria, flank pain, frequency, hematuria and urgency.  Musculoskeletal:  Negative for back pain, joint pain and myalgias.  Skin:  Negative for rash.  Neurological:  Negative for dizziness, tingling, focal weakness, seizures, weakness and headaches.  Endo/Heme/Allergies:  Does not bruise/bleed easily.  Psychiatric/Behavioral:  Negative for depression and suicidal ideas. The patient does not have insomnia.       Allergies  Allergen Reactions   Atorvastatin      Past Medical History:  Diagnosis Date   Basal cell carcinoma (BCC) of back    Basal cell carcinoma (BCC) of nasal sidewall    Dizziness    GERD (gastroesophageal reflux disease)    H/O blood clots    Headache    migraines   Hypercholesterolemia    Hypertension    hx of HBP readings   Metastatic squamous cell carcinoma 2018   bx cervical lymph node   Seizures (HCC) 2021   Tonsillar cancer (HCC)    Vascular abnormality    VTE (venous thromboembolism) 2018   associated with chemotherapy     Past Surgical History:  Procedure Laterality Date   COLONOSCOPY     COLONOSCOPY N/A 05/08/2021   Procedure: COLONOSCOPY;  Surgeon: Jaynie Collins, DO;  Location: [facility] ENDOSCOPY;  Service: Gastroenterology;  Laterality: N/A;   endovascular balloon angioplasty femoral/popliteal artery w/stent Left 11/08/2017   ESOPHAGOGASTRODUODENOSCOPY N/A 05/08/2021   Procedure: ESOPHAGOGASTRODUODENOSCOPY (EGD);  Surgeon: Jaynie Collins, DO;  Location: [facility] ENDOSCOPY;  Service: Gastroenterology;  Laterality: N/A;   ESOPHAGOGASTRODUODENOSCOPY (EGD) WITH PROPOFOL N/A 05/24/2015   Procedure: ESOPHAGOGASTRODUODENOSCOPY (EGD) WITH  PROPOFOL;  Surgeon: Scot Jun, MD;  Location: [facility] ENDOSCOPY;  Service: Endoscopy;  Laterality: N/A;   FEMORAL-FEMORAL BYPASS GRAFT     IR IMAGING GUIDED PORT INSERTION  12/09/2016   LEFT HEART CATH AND CORONARY ANGIOGRAPHY N/A 12/25/2019   Procedure: LEFT HEART CATH AND CORONARY ANGIOGRAPHY;  Surgeon: Lamar Blinks, MD;  Location: [facility] INVASIVE CV LAB;  Service: Cardiovascular;  Laterality: N/A;   SAVORY DILATION N/A 05/24/2015   Procedure: SAVORY DILATION;  Surgeon: Scot Jun, MD;  Location: [facility] ENDOSCOPY;  Service: Endoscopy;  Laterality: N/A;    Social History   Socioeconomic History   Marital status: Married    Spouse name: Not on file   Number of children: Not on file   Years of education: Not on file   Highest education level: Not on file  Occupational History   Occupation: Curator  Tobacco Use   Smoking status: Former    Current packs/day: 0.00    Types: Cigarettes    Quit date: 11/11/1996    Years since quitting: 26.5   Smokeless tobacco: Never  Vaping Use   Vaping status: Never Used  Substance and Sexual Activity   Alcohol use: Yes    Comment: occasional   Drug use: No   Sexual activity: Yes  Other Topics Concern   Not on file  Social History Narrative   Not on file   Social Determinants of Health   Financial Resource Strain: Low Risk  (06/22/2022)   Overall Financial Resource Strain (CARDIA)    Difficulty of Paying Living Expenses: Not hard at all  Food Insecurity: No Food Insecurity (06/22/2022)   Hunger Vital Sign    Worried About Running Out of Food in the Last Year: Never true    Ran Out of Food in the Last Year: Never true  Transportation Needs: No Transportation Needs (06/22/2022)   PRAPARE - Administrator, Civil Service (Medical): No    Lack of Transportation (Non-Medical): No  Physical Activity: Insufficiently Active (06/22/2022)   Exercise Vital Sign    Days of Exercise per Week: 4 days    Minutes of Exercise per  Session: 30 min  Stress: No Stress Concern Present (06/22/2022)   [employer] of Occupational Health - Occupational Stress Questionnaire    Feeling of Stress : Not at all  Social Connections: Unknown (06/22/2022)   Social Connection and Isolation Panel [NHANES]    Frequency of Communication with Friends and Family: More than three times a week    Frequency of Social Gatherings with Friends and Family: More than three times a week    Attends Religious Services: More than 4 times per year    Active Member of [employer] or Organizations: Not on file    Attends Banker Meetings: Not on file    Marital Status: Married  Intimate Partner Violence: Not At Risk (06/22/2022)   Humiliation, Afraid, Rape, and Kick questionnaire    Fear of Current or Ex-Partner: No    Emotionally Abused: No    Physically Abused: No    Sexually Abused: No    Family History  Problem Relation Age of Onset   Hyperlipidemia Mother  Heart disease Mother    Hypertension Mother    Alzheimer's disease Mother    Prostate cancer Father    Stroke Father      Current Outpatient Medications:    apixaban (ELIQUIS) 5 MG TABS tablet, Take 1 tablet (5 mg total) by mouth every 12 (twelve) hours., Disp: 180 tablet, Rfl: 1   aspirin EC 81 MG tablet, Take 81 mg by mouth daily. Swallow whole., Disp: , Rfl:    busPIRone (BUSPAR) 5 MG tablet, Take 1 tablet (5 mg total) by mouth daily as needed., Disp: 30 tablet, Rfl: 1   lisinopril (ZESTRIL) 40 MG tablet, Take 1 tablet (40 mg total) by mouth daily., Disp: 90 tablet, Rfl: 3   Multiple Vitamin (MULTIVITAMIN) capsule, Take by mouth., Disp: , Rfl:    nitroGLYCERIN (NITROSTAT) 0.4 MG SL tablet, Place 0.4 mg under the tongue every 5 (five) minutes as needed for chest pain., Disp: , Rfl:    omeprazole (PRILOSEC) 20 MG capsule, Take 1 capsule (20 mg total) by mouth 2 (two) times daily., Disp: 180 capsule, Rfl: 3   pravastatin (PRAVACHOL) 40 MG tablet, Take 1 tablet by mouth  daily., Disp: , Rfl:    REPATHA SURECLICK 140 MG/ML SOAJ, INJECT 140 MG INTO THE SKIN EVERY 14 DAYS, Disp: 2 mL, Rfl: 2  Physical exam:  Vitals:   05/19/23 1004  BP: 136/88  Pulse: 90  Temp: 98.3 F (36.8 C)  TempSrc: Tympanic  SpO2: 100%  Weight: 161 lb (73 kg)   Physical Exam Cardiovascular:     Rate and Rhythm: Normal rate and regular rhythm.     Heart sounds: Normal heart sounds.  Pulmonary:     Effort: Pulmonary effort is normal.     Breath sounds: Normal breath sounds.  Skin:    General: Skin is warm and dry.  Neurological:     Mental Status: He is alert and oriented to person, place, and time.         Latest Ref Rng & Units 01/07/2023    8:39 AM  CMP  Glucose 70 - 99 mg/dL 829   BUN 6 - 23 mg/dL 14   Creatinine 5.62 - 1.50 mg/dL 1.30   Sodium 865 - 784 mEq/L 135   Potassium 3.5 - 5.1 mEq/L 4.8   Chloride 96 - 112 mEq/L 101   CO2 19 - 32 mEq/L 28   Calcium 8.4 - 10.5 mg/dL 9.4   Total Protein 6.0 - 8.3 g/dL 7.2   Total Bilirubin 0.2 - 1.2 mg/dL 0.4   Alkaline Phos 39 - 117 U/L 60   AST 0 - 37 U/L 23   ALT 0 - 53 U/L 22       Latest Ref Rng & Units 05/19/2023    9:49 AM  CBC  WBC 4.0 - 10.5 K/uL 8.5   Hemoglobin 13.0 - 17.0 g/dL 69.6   Hematocrit 29.5 - 52.0 % 41.4   Platelets 150 - 400 K/uL 384      Assessment and plan- Patient is a 62 y.o. male here for routine follow-up of iron deficiency anemia  Patient is not presently anemic with an H&H of 13.4/41.4.  He last received IV iron over a year ago.  However today his ferritin levels came back at 15 with an iron saturation of 15% after he had left the clinic.  He will reach out to him if he would be interested in getting IV iron at this time.  Initially my plan was for  him to follow-up with his primary care doctor but in view of his continued iron deficiency anemia I think it would be reasonable to get CBC ferritin and iron studies in 6 months in 1 year and see him back in 1 year   Visit Diagnosis 1.  Iron deficiency anemia, unspecified iron deficiency anemia type      Dr. Owens Shark, MD, MPH [facility] [phone] 05/19/2023 3:41 PM

## 2023-05-20 ENCOUNTER — Encounter: Payer: Medicare Other | Attending: Cardiology

## 2023-05-20 VITALS — Ht 67.5 in | Wt 164.4 lb

## 2023-05-20 DIAGNOSIS — Z5189 Encounter for other specified aftercare: Secondary | ICD-10-CM | POA: Insufficient documentation

## 2023-05-20 DIAGNOSIS — I2089 Other forms of angina pectoris: Secondary | ICD-10-CM | POA: Insufficient documentation

## 2023-05-20 NOTE — Progress Notes (Signed)
Cardiac Individual Treatment Plan  Patient Details  Name: Timothy Garrison MRN: [id number] Date of Birth: November 27, 1960 Referring Provider:   Flowsheet Row Cardiac Rehab from 05/20/2023 in [facility] Cardiac and Pulmonary Rehab  Referring Provider Vevelyn Francois, MD       Initial Encounter Date:  Flowsheet Row Cardiac Rehab from 05/20/2023 in [facility] Cardiac and Pulmonary Rehab  Date 05/20/23       Visit Diagnosis: Chronic stable angina (HCC)  Patient's Home Medications on Admission:  Current Outpatient Medications:    apixaban (ELIQUIS) 5 MG TABS tablet, Take 1 tablet (5 mg total) by mouth every 12 (twelve) hours., Disp: 180 tablet, Rfl: 1   aspirin EC 81 MG tablet, Take 81 mg by mouth daily. Swallow whole., Disp: , Rfl:    busPIRone (BUSPAR) 5 MG tablet, Take 1 tablet (5 mg total) by mouth daily as needed., Disp: 30 tablet, Rfl: 1   lisinopril (ZESTRIL) 40 MG tablet, Take 1 tablet (40 mg total) by mouth daily., Disp: 90 tablet, Rfl: 3   Multiple Vitamin (MULTIVITAMIN) capsule, Take by mouth., Disp: , Rfl:    nitroGLYCERIN (NITROSTAT) 0.4 MG SL tablet, Place 0.4 mg under the tongue every 5 (five) minutes as needed for chest pain., Disp: , Rfl:    omeprazole (PRILOSEC) 20 MG capsule, Take 1 capsule (20 mg total) by mouth 2 (two) times daily., Disp: 180 capsule, Rfl: 3   pravastatin (PRAVACHOL) 40 MG tablet, Take 1 tablet by mouth daily., Disp: , Rfl:    REPATHA SURECLICK 140 MG/ML SOAJ, INJECT 140 MG INTO THE SKIN EVERY 14 DAYS, Disp: 2 mL, Rfl: 2  Past Medical History: Past Medical History:  Diagnosis Date   Basal cell carcinoma (BCC) of back    Basal cell carcinoma (BCC) of nasal sidewall    Dizziness    GERD (gastroesophageal reflux disease)    H/O blood clots    Headache    migraines   Hypercholesterolemia    Hypertension    hx of HBP readings   Metastatic squamous cell carcinoma 2018   bx cervical lymph node   Seizures (HCC) 2021   Tonsillar cancer (HCC)    Vascular  abnormality    VTE (venous thromboembolism) 2018   associated with chemotherapy    Tobacco Use: Social History   Tobacco Use  Smoking Status Former   Current packs/day: 0.00   Types: Cigarettes   Quit date: 11/11/1996   Years since quitting: 26.5  Smokeless Tobacco Never    Labs: Review Flowsheet  More data exists      Latest Ref Rng & Units 10/30/2021 03/04/2022 06/05/2022 09/08/2022 01/07/2023  Labs for ITP Cardiac and Pulmonary Rehab  Cholestrol 0 - 200 mg/dL 409  811  914  782  956   LDL (calc) 0 - 99 mg/dL 213  086  578  - 32   Direct LDL mg/dL - - - 469.6  -  HDL-C >29.52 mg/dL 84.13  24.40  10.27  25.36  44.30   Trlycerides 0.0 - 149.0 mg/dL 644.0  347.4  259.5  638.7  121.0     Details             Exercise Target Goals: Exercise Program Goal: Individual exercise prescription set using results from initial 6 min walk test and THRR while considering  patient's activity barriers and safety.   Exercise Prescription Goal: Initial exercise prescription builds to 30-45 minutes a day of aerobic activity, 2-3 days per week.  Home exercise guidelines will  be given to patient during program as part of exercise prescription that the participant will acknowledge.   Education: Aerobic Exercise: - Group verbal and visual presentation on the components of exercise prescription. Introduces F.I.T.T principle from ACSM for exercise prescriptions.  Reviews F.I.T.T. principles of aerobic exercise including progression. Written material given at graduation. Flowsheet Row Cardiac Rehab from 05/20/2023 in [facility] Cardiac and Pulmonary Rehab  Education need identified 05/20/23       Education: Resistance Exercise: - Group verbal and visual presentation on the components of exercise prescription. Introduces F.I.T.T principle from ACSM for exercise prescriptions  Reviews F.I.T.T. principles of resistance exercise including progression. Written material given at graduation.    Education:  Exercise & Equipment Safety: - Individual verbal instruction and demonstration of equipment use and safety with use of the equipment. Flowsheet Row Cardiac Rehab from 05/20/2023 in [facility] Cardiac and Pulmonary Rehab  Date 05/20/23  Educator MB  Instruction Review Code 1- Verbalizes Understanding       Education: Exercise Physiology & General Exercise Guidelines: - Group verbal and written instruction with models to review the exercise physiology of the cardiovascular system and associated critical values. Provides general exercise guidelines with specific guidelines to those with heart or lung disease.    Education: Flexibility, Balance, Mind/Body Relaxation: - Group verbal and visual presentation with interactive activity on the components of exercise prescription. Introduces F.I.T.T principle from ACSM for exercise prescriptions. Reviews F.I.T.T. principles of flexibility and balance exercise training including progression. Also discusses the mind body connection.  Reviews various relaxation techniques to help reduce and manage stress (i.e. Deep breathing, progressive muscle relaxation, and visualization). Balance handout provided to take home. Written material given at graduation.   Activity Barriers & Risk Stratification:  Activity Barriers & Cardiac Risk Stratification - 05/20/23 1453       Activity Barriers & Cardiac Risk Stratification   Activity Barriers Other (comment);Neck/Spine Problems    Comments leg pain with walking    Cardiac Risk Stratification High             6 Minute Walk:  6 Minute Walk     Row Name 05/20/23 1452         6 Minute Walk   Phase Initial     Distance 1330 feet     Walk Time 6 minutes     # of Rest Breaks 0     MPH 2.52     METS 3.9     RPE 12     Perceived Dyspnea  0     VO2 Peak 13.64     Symptoms Yes (comment)     Comments 4/10 Leg pain     Resting HR 85 bpm     Resting BP 146/86     Resting Oxygen Saturation  97 %     Exercise  Oxygen Saturation  during 6 min walk 99 %     Max Ex. HR 107 bpm     Max Ex. BP 170/92     2 Minute Post BP 158/92              Oxygen Initial Assessment:   Oxygen Re-Evaluation:   Oxygen Discharge (Final Oxygen Re-Evaluation):   Initial Exercise Prescription:  Initial Exercise Prescription - 05/20/23 1400       Date of Initial Exercise RX and Referring Provider   Date 05/20/23    Referring Provider Vevelyn Francois, MD      Oxygen   Maintain Oxygen Saturation 88% or  higher      Treadmill   MPH 2.5    Grade 0    Minutes 15    METs 2.91      Recumbant Bike   Level 3    RPM 50    Watts 45    Minutes 15    METs 3.9      NuStep   Level 3    SPM 80    Minutes 15    METs 3.9      Elliptical   Level 2    Speed 3    Minutes 15    METs 3.9      Prescription Details   Frequency (times per week) 2    Duration Progress to 30 minutes of continuous aerobic without signs/symptoms of physical distress      Intensity   THRR 40-80% of Max Heartrate 114-143    Ratings of Perceived Exertion 11-13    Perceived Dyspnea 0-4      Progression   Progression Continue to progress workloads to maintain intensity without signs/symptoms of physical distress.      Resistance Training   Training Prescription Yes    Weight 5 lb    Reps 10-15             Perform Capillary Blood Glucose checks as needed.  Exercise Prescription Changes:   Exercise Prescription Changes     Row Name 05/20/23 1400             Response to Exercise   Blood Pressure (Admit) 146/86       Blood Pressure (Exercise) 170/92       Blood Pressure (Exit) 146/82       Heart Rate (Admit) 85 bpm       Heart Rate (Exercise) 107 bpm       Heart Rate (Exit) 72 bpm       Oxygen Saturation (Admit) 97 %       Oxygen Saturation (Exercise) 99 %       Oxygen Saturation (Exit) 99 %       Rating of Perceived Exertion (Exercise) 12       Perceived Dyspnea (Exercise) 0       Symptoms 4/10 leg pain        Comments results       Duration Progress to 30 minutes of  aerobic without signs/symptoms of physical distress       Intensity THRR New         Progression   Progression Continue to progress workloads to maintain intensity without signs/symptoms of physical distress.       Average METs 3.9                Exercise Comments:   Exercise Goals and Review:   Exercise Goals     Row Name 05/20/23 1456             Exercise Goals   Increase Physical Activity Yes       Intervention Provide advice, education, support and counseling about physical activity/exercise needs.;Develop an individualized exercise prescription for aerobic and resistive training based on initial evaluation findings, risk stratification, comorbidities and participant's personal goals.       Expected Outcomes Short Term: Attend rehab on a regular basis to increase amount of physical activity.;Long Term: Exercising regularly at least 3-5 days a week.;Long Term: Add in home exercise to make exercise part of routine and to increase amount of physical activity.  Increase Strength and Stamina Yes       Intervention Provide advice, education, support and counseling about physical activity/exercise needs.;Develop an individualized exercise prescription for aerobic and resistive training based on initial evaluation findings, risk stratification, comorbidities and participant's personal goals.       Expected Outcomes Short Term: Increase workloads from initial exercise prescription for resistance, speed, and METs.;Short Term: Perform resistance training exercises routinely during rehab and add in resistance training at home;Long Term: Improve cardiorespiratory fitness, muscular endurance and strength as measured by increased METs and functional capacity ( )       Able to understand and use rate of perceived exertion (RPE) scale Yes       Intervention Provide education and explanation on how to use RPE scale        Expected Outcomes Short Term: Able to use RPE daily in rehab to express subjective intensity level;Long Term:  Able to use RPE to guide intensity level when exercising independently       Able to understand and use Dyspnea scale Yes       Intervention Provide education and explanation on how to use Dyspnea scale       Expected Outcomes Short Term: Able to use Dyspnea scale daily in rehab to express subjective sense of shortness of breath during exertion;Long Term: Able to use Dyspnea scale to guide intensity level when exercising independently       Knowledge and understanding of Target Heart Rate Range (THRR) Yes       Intervention Provide education and explanation of THRR including how the numbers were predicted and where they are located for reference       Expected Outcomes Short Term: Able to state/look up THRR;Long Term: Able to use THRR to govern intensity when exercising independently;Short Term: Able to use daily as guideline for intensity in rehab       Able to check pulse independently Yes       Intervention Provide education and demonstration on how to check pulse in carotid and radial arteries.;Review the importance of being able to check your own pulse for safety during independent exercise       Expected Outcomes Short Term: Able to explain why pulse checking is important during independent exercise;Long Term: Able to check pulse independently and accurately       Understanding of Exercise Prescription Yes       Intervention Provide education, explanation, and written materials on patient's individual exercise prescription       Expected Outcomes Short Term: Able to explain program exercise prescription;Long Term: Able to explain home exercise prescription to exercise independently                Exercise Goals Re-Evaluation :   Discharge Exercise Prescription (Final Exercise Prescription Changes):  Exercise Prescription Changes - 05/20/23 1400       Response to Exercise    Blood Pressure (Admit) 146/86    Blood Pressure (Exercise) 170/92    Blood Pressure (Exit) 146/82    Heart Rate (Admit) 85 bpm    Heart Rate (Exercise) 107 bpm    Heart Rate (Exit) 72 bpm    Oxygen Saturation (Admit) 97 %    Oxygen Saturation (Exercise) 99 %    Oxygen Saturation (Exit) 99 %    Rating of Perceived Exertion (Exercise) 12    Perceived Dyspnea (Exercise) 0    Symptoms 4/10 leg pain    Comments results    Duration Progress to 30 minutes  of  aerobic without signs/symptoms of physical distress    Intensity THRR New      Progression   Progression Continue to progress workloads to maintain intensity without signs/symptoms of physical distress.    Average METs 3.9             Nutrition:  Target Goals: Understanding of nutrition guidelines, daily intake of sodium 1500mg , cholesterol 200mg , calories 30% from fat and 7% or less from saturated fats, daily to have 5 or more servings of fruits and vegetables.  Education: All About Nutrition: -Group instruction provided by verbal, written material, interactive activities, discussions, models, and posters to present general guidelines for heart healthy nutrition including fat, fiber, MyPlate, the role of sodium in heart healthy nutrition, utilization of the nutrition label, and utilization of this knowledge for meal planning. Follow up email sent as well. Written material given at graduation. Flowsheet Row Cardiac Rehab from 05/20/2023 in [facility] Cardiac and Pulmonary Rehab  Education need identified 05/20/23       Biometrics:  Pre Biometrics - 05/20/23 1457       Pre Biometrics   Height 5' 7.5" (1.715 m)    Weight 164 lb 6.4 oz (74.6 kg)    Waist Circumference 36.6 inches    Hip Circumference 38 inches    Waist to Hip Ratio 0.96 %    BMI (Calculated) 25.35    Single Leg Stand 21 seconds              Nutrition Therapy Plan and Nutrition Goals:  Nutrition Therapy & Goals - 05/20/23 1206       Nutrition  Therapy   Diet Cardiac, low na    Protein (specify units) 90    Fiber 30 grams    Whole Grain Foods 3 servings    Saturated Fats 15 max. grams    Fruits and Vegetables 5 servings/day    Sodium 2 grams      Personal Nutrition Goals   Nutrition Goal Choose complex carbs and pair with healthy protein    Personal Goal #2 Include colorful produce at large meals or when eating out    Personal Goal #3 Make better snack choices    Comments Patient drinking mostly water. Sweet tea a few times a week, 6pack of beer per week. He is happy with the reductions he has made before coming to Rehab in these areas. Commended him on these changes and encouraged him to continue to work on them. He reports he eats 3 meals per day, wife cooks most of his food. He reports she is health conscious and has been helping him to reduce sodium, saturated fat, and sugary foods. Reviewed mediterranean diet handout, educated on types of fats, sources, and how to read a label. Reminded him to watch his sodium intake and choose low sodium varieties of foods he likes. He asked about iron as well. Spoke with him about using iron fortified foods like cereal, using Vitamin C to help absorption and avoiding competing nutrients like calcium at the same times. Built out several meals and snacks with foods he likes with focus on including more colorful produce and complex carbs paired with healthy protein.      Intervention Plan   Intervention Prescribe, educate and counsel regarding individualized specific dietary modifications aiming towards targeted core components such as weight, hypertension, lipid management, diabetes, heart failure and other comorbidities.;Nutrition handout(s) given to patient.    Expected Outcomes Short Term Goal: Understand basic principles of dietary content,  such as calories, fat, sodium, cholesterol and nutrients.;Short Term Goal: A plan has been developed with personal nutrition goals set during dietitian  appointment.;Long Term Goal: Adherence to prescribed nutrition plan.             Nutrition Assessments:  MEDIFICTS Score Key: >=70 Need to make dietary changes  40-70 Heart Healthy Diet <= 40 Therapeutic Level Cholesterol Diet  Flowsheet Row Cardiac Rehab from 05/20/2023 in [facility] Cardiac and Pulmonary Rehab  Picture Your Plate Total Score on Admission 67      Picture Your Plate Scores: <29 Unhealthy dietary pattern with much room for improvement. 41-50 Dietary pattern unlikely to meet recommendations for good health and room for improvement. 51-60 More healthful dietary pattern, with some room for improvement.  >60 Healthy dietary pattern, although there may be some specific behaviors that could be improved.    Nutrition Goals Re-Evaluation:   Nutrition Goals Discharge (Final Nutrition Goals Re-Evaluation):   Psychosocial: Target Goals: Acknowledge presence or absence of significant depression and/or stress, maximize coping skills, provide positive support system. Participant is able to verbalize types and ability to use techniques and skills needed for reducing stress and depression.   Education: Stress, Anxiety, and Depression - Group verbal and visual presentation to define topics covered.  Reviews how body is impacted by stress, anxiety, and depression.  Also discusses healthy ways to reduce stress and to treat/manage anxiety and depression.  Written material given at graduation.   Education: Sleep Hygiene -Provides group verbal and written instruction about how sleep can affect your health.  Define sleep hygiene, discuss sleep cycles and impact of sleep habits. Review good sleep hygiene tips.    Initial Review & Psychosocial Screening:  Initial Psych Review & Screening - 05/06/23 1337       Initial Review   Current issues with None Identified      Family Dynamics   Good Support System? Yes   wife, friends     Barriers   Psychosocial barriers to participate in  program There are no identifiable barriers or psychosocial needs.      Screening Interventions   Interventions Encouraged to exercise;To provide support and resources with identified psychosocial needs    Expected Outcomes Short Term goal: Utilizing psychosocial counselor, staff and physician to assist with identification of specific Stressors or current issues interfering with healing process. Setting desired goal for each stressor or current issue identified.;Long Term Goal: Stressors or current issues are controlled or eliminated.;Short Term goal: Identification and review with participant of any Quality of Life or Depression concerns found by scoring the questionnaire.;Long Term goal: The participant improves quality of Life and PHQ9 Scores as seen by post scores and/or verbalization of changes             Quality of Life Scores:   Quality of Life - 05/20/23 1458       Quality of Life   Select Quality of Life      Quality of Life Scores   Health/Function Pre 22.7 %    Socioeconomic Pre 23.56 %    Psych/Spiritual Pre 24.86 %    Family Pre 27.6 %    GLOBAL Pre 24.03 %            Scores of 19 and below usually indicate a poorer quality of life in these areas.  A difference of  2-3 points is a clinically meaningful difference.  A difference of 2-3 points in the total score of the Quality of Life Index  has been associated with significant improvement in overall quality of life, self-image, physical symptoms, and general health in studies assessing change in quality of life.  PHQ-9: Review Flowsheet  More data exists      06/22/2022 03/04/2022 06/20/2021 05/08/2020 04/25/2019  Depression screen PHQ 2/9  Decreased Interest 0 0 0 0 0  Down, Depressed, Hopeless 0 0 0 0 0  PHQ - 2 Score 0 0 0 0 0    Details           Interpretation of Total Score  Total Score Depression Severity:  1-4 = Minimal depression, 5-9 = Mild depression, 10-14 = Moderate depression, 15-19 = Moderately  severe depression, 20-27 = Severe depression   Psychosocial Evaluation and Intervention:  Psychosocial Evaluation - 05/06/23 1355       Psychosocial Evaluation & Interventions   Interventions Encouraged to exercise with the program and follow exercise prescription    Comments Luie is coming to cardiac rehab with stable angina. He meets with his cardiologist this week to discuss options for his blockage. He currently does not struggle with chest pain but knows what medication to take if he starts. 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 His wife and friends are a great support system for him. He does have leg pain with walking which can hinder his activites but he states that he just rests some and then gets back to what he was doing. He mentions he has a positive outlook and is ready to start the program to learn what he can/should be doing.    Expected Outcomes Short: attend cardiac rehab for education and exercise. Long: Develop and maintain positive self care habits.    Continue Psychosocial Services  Follow up required by staff             Psychosocial Re-Evaluation:   Psychosocial Discharge (Final Psychosocial Re-Evaluation):   Vocational Rehabilitation: Provide vocational rehab assistance to qualifying candidates.   Vocational Rehab Evaluation & Intervention:  Vocational Rehab - 05/06/23 1336       Initial Vocational Rehab Evaluation & Intervention   Assessment shows need for Vocational Rehabilitation No             Education: Education Goals: Education classes will be provided on a variety of topics geared toward better understanding of heart health and risk factor modification. Participant will state understanding/return demonstration of topics presented as noted by education test scores.  Learning Barriers/Preferences:  Learning Barriers/Preferences - 05/06/23 1336       Learning Barriers/Preferences   Learning Barriers None    Learning Preferences None              General Cardiac Education Topics:  AED/CPR: - Group verbal and written instruction with the use of models to demonstrate the basic use of the AED with the basic ABC's of resuscitation.   Anatomy and Cardiac Procedures: - Group verbal and visual presentation and models provide information about basic cardiac anatomy and function. Reviews the testing methods done to diagnose heart disease and the outcomes of the test results. Describes the treatment choices: Medical Management, Angioplasty, or Coronary Bypass Surgery for treating various heart conditions including Myocardial Infarction, Angina, Valve Disease, and Cardiac Arrhythmias.  Written material given at graduation.   Medication Safety: - Group verbal and visual instruction to review commonly prescribed medications for heart and lung disease. Reviews the medication, class of the drug, and side effects. Includes the steps to properly store meds and maintain the prescription regimen.  Written material given at graduation.   Intimacy: - Group verbal instruction through game format to discuss how heart and lung disease can affect sexual intimacy. Written material given at graduation..   Know Your Numbers and Heart Failure: - Group verbal and visual instruction to discuss disease risk factors for cardiac and pulmonary disease and treatment options.  Reviews associated critical values for Overweight/Obesity, Hypertension, Cholesterol, and Diabetes.  Discusses basics of heart failure: signs/symptoms and treatments.  Introduces Heart Failure Zone chart for action plan for heart failure.  Written material given at graduation.   Infection Prevention: - Provides verbal and written material to individual with discussion of infection control including proper hand washing and proper equipment cleaning during exercise session. Flowsheet Row Cardiac Rehab from 05/20/2023 in [facility] Cardiac and Pulmonary Rehab  Date 05/20/23  Educator MB   Instruction Review Code 1- Verbalizes Understanding       Falls Prevention: - Provides verbal and written material to individual with discussion of falls prevention and safety. Flowsheet Row Cardiac Rehab from 05/20/2023 in [facility] Cardiac and Pulmonary Rehab  Date 05/20/23  Educator MB  Instruction Review Code 1- Verbalizes Understanding       Other: -Provides group and verbal instruction on various topics (see comments)   Knowledge Questionnaire Score:  Knowledge Questionnaire Score - 05/20/23 1500       Knowledge Questionnaire Score   Pre Score 24/26             Core Components/Risk Factors/Patient Goals at Admission:  Personal Goals and Risk Factors at Admission - 05/20/23 1501       Core Components/Risk Factors/Patient Goals on Admission    Weight Management Yes;Weight Maintenance    Intervention Weight Management: Develop a combined nutrition and exercise program designed to reach desired caloric intake, while maintaining appropriate intake of nutrient and fiber, sodium and fats, and appropriate energy expenditure required for the weight goal.;Weight Management: Provide education and appropriate resources to help participant work on and attain dietary goals.;Weight Management/Obesity: Establish reasonable short term and long term weight goals.    Admit Weight 164 lb 6.4 oz (74.6 kg)    Goal Weight: Short Term 164 lb 6.4 oz (74.6 kg)    Goal Weight: Long Term 164 lb 6.4 oz (74.6 kg)    Expected Outcomes Short Term: Continue to assess and modify interventions until short term weight is achieved;Long Term: Adherence to nutrition and physical activity/exercise program aimed toward attainment of established weight goal;Weight Maintenance: Understanding of the daily nutrition guidelines, which includes 25-35% calories from fat, 7% or less cal from saturated fats, less than 200mg  cholesterol, less than 1.5gm of sodium, & 5 or more servings of fruits and vegetables  daily;Understanding recommendations for meals to include 15-35% energy as protein, 25-35% energy from fat, 35-60% energy from carbohydrates, less than 200mg  of dietary cholesterol, 20-35 gm of total fiber daily;Understanding of distribution of calorie intake throughout the day with the consumption of 4-5 meals/snacks    Hypertension Yes    Intervention Provide education on lifestyle modifcations including regular physical activity/exercise, weight management, moderate sodium restriction and increased consumption of fresh fruit, vegetables, and low fat dairy, alcohol moderation, and smoking cessation.;Monitor prescription use compliance.    Expected Outcomes Short Term: Continued assessment and intervention until BP is < 140/8mm HG in hypertensive participants. < 130/17mm HG in hypertensive participants with diabetes, heart failure or chronic kidney disease.;Long Term: Maintenance of blood pressure at goal levels.    Lipids Yes    Intervention  Provide education and support for participant on nutrition & aerobic/resistive exercise along with prescribed medications to achieve LDL 70mg , HDL >40mg .    Expected Outcomes Short Term: Participant states understanding of desired cholesterol values and is compliant with medications prescribed. Participant is following exercise prescription and nutrition guidelines.;Long Term: Cholesterol controlled with medications as prescribed, with individualized exercise RX and with personalized nutrition plan. Value goals: LDL < 70mg , HDL > 40 mg.             Education:Diabetes - Individual verbal and written instruction to review signs/symptoms of diabetes, desired ranges of glucose level fasting, after meals and with exercise. Acknowledge that pre and post exercise glucose checks will be done for 3 sessions at entry of program.   Core Components/Risk Factors/Patient Goals Review:    Core Components/Risk Factors/Patient Goals at Discharge (Final Review):    ITP  Comments:  ITP Comments     Row Name 05/06/23 1343 05/20/23 1451         ITP Comments Initial phone call completed. Diagnosis can be found in [facility] 8/30. EP Orientation scheduled for Thursday 10/3 at 9:30. Completed and gym orientation. Initial ITP created and sent for review to Dr. Bethann Punches, Medical Director.               Comments: Initial ITP

## 2023-05-20 NOTE — Progress Notes (Signed)
Assessment start time: 11:03 AM  Digestive issues/concerns: no known food allergies   24-hours Recall: B: cheerios L: chicken D: [place] sandwich  Beverages Sweet tea a few times per week, water (~64oz) Alcohol 6pack per week Caffeine none  Supplements MVI Intake Patterns consistent with his eating habits, usually gets 3 meals per day  Education r/t nutrition plan Patient drinking mostly water. Sweet tea a few times a week, 6pack of beer per week. He is happy with the reductions he has made before coming to Rehab in these areas. Commended him on these changes and encouraged him to continue to work on them. He reports he eats 3 meals per day, wife cooks most of his food. He reports she is health conscious and has been helping him to reduce sodium, saturated fat, and sugary foods. Reviewed mediterranean diet handout, educated on types of fats, sources, and how to read a label. Reminded him to watch his sodium intake and choose low sodium varieties of foods he likes. He asked about iron as well. Spoke with him about using iron fortified foods like cereal, using Vitamin C to help absorption and avoiding competing nutrients like calcium at the same times. Built out several meals and snacks with foods he likes with focus on including more colorful produce and complex carbs paired with healthy protein.    Goal 1: Choose complex carbs and pair with healthy protein  Goal 2: Include colorful produce at large meals or when eating out Goal 3: Make better snack choices   End time 11:48 AM

## 2023-05-20 NOTE — Patient Instructions (Signed)
Patient Instructions  Patient Details  Name: Timothy Garrison MRN: [id number] Date of Birth: [birth year] Referring Provider:  Midge Minium, MD  Below are your personal goals for exercise, nutrition, and risk factors. Our goal is to help you stay on track towards obtaining and maintaining these goals. We will be discussing your progress on these goals with you throughout the program.  Initial Exercise Prescription:  Initial Exercise Prescription - 05/20/23 1400       Date of Initial Exercise RX and Referring Provider   Date 05/20/23    Referring Provider Vevelyn Francois, MD      Oxygen   Maintain Oxygen Saturation 88% or higher      Treadmill   MPH 2.5    Grade 0    Minutes 15    METs 2.91      Recumbant Bike   Level 3    RPM 50    Watts 45    Minutes 15    METs 3.9      NuStep   Level 3    SPM 80    Minutes 15    METs 3.9      Elliptical   Level 2    Speed 3    Minutes 15    METs 3.9      Prescription Details   Frequency (times per week) 2    Duration Progress to 30 minutes of continuous aerobic without signs/symptoms of physical distress      Intensity   THRR 40-80% of Max Heartrate 114-143    Ratings of Perceived Exertion 11-13    Perceived Dyspnea 0-4      Progression   Progression Continue to progress workloads to maintain intensity without signs/symptoms of physical distress.      Resistance Training   Training Prescription Yes    Weight 5 lb    Reps 10-15             Exercise Goals: Frequency: Be able to perform aerobic exercise two to three times per week in program working toward 2-5 days per week of home exercise.  Intensity: Work with a perceived exertion of 11 (fairly light) - 15 (hard) while following your exercise prescription.  We will make changes to your prescription with you as you progress through the program.   Duration: Be able to do 30 to 45 minutes of continuous aerobic exercise in addition to a 5 minute warm-up and a 5  minute cool-down routine.   Nutrition Goals: Your personal nutrition goals will be established when you do your nutrition analysis with the dietician.  The following are general nutrition guidelines to follow: Cholesterol < 200mg /day Sodium < 1500mg /day Fiber: Men over 50 yrs - 30 grams per day  Personal Goals:  Personal Goals and Risk Factors at Admission - 05/20/23 1501       Core Components/Risk Factors/Patient Goals on Admission    Weight Management Yes;Weight Maintenance    Intervention Weight Management: Develop a combined nutrition and exercise program designed to reach desired caloric intake, while maintaining appropriate intake of nutrient and fiber, sodium and fats, and appropriate energy expenditure required for the weight goal.;Weight Management: Provide education and appropriate resources to help participant work on and attain dietary goals.;Weight Management/Obesity: Establish reasonable short term and long term weight goals.    Admit Weight 164 lb 6.4 oz (74.6 kg)    Goal Weight: Short Term 164 lb 6.4 oz (74.6 kg)    Goal Weight: Long Term 164 lb  6.4 oz (74.6 kg)    Expected Outcomes Short Term: Continue to assess and modify interventions until short term weight is achieved;Long Term: Adherence to nutrition and physical activity/exercise program aimed toward attainment of established weight goal;Weight Maintenance: Understanding of the daily nutrition guidelines, which includes 25-35% calories from fat, 7% or less cal from saturated fats, less than 200mg  cholesterol, less than 1.5gm of sodium, & 5 or more servings of fruits and vegetables daily;Understanding recommendations for meals to include 15-35% energy as protein, 25-35% energy from fat, 35-60% energy from carbohydrates, less than 200mg  of dietary cholesterol, 20-35 gm of total fiber daily;Understanding of distribution of calorie intake throughout the day with the consumption of 4-5 meals/snacks    Hypertension Yes     Intervention Provide education on lifestyle modifcations including regular physical activity/exercise, weight management, moderate sodium restriction and increased consumption of fresh fruit, vegetables, and low fat dairy, alcohol moderation, and smoking cessation.;Monitor prescription use compliance.    Expected Outcomes Short Term: Continued assessment and intervention until BP is < 140/44mm HG in hypertensive participants. < 130/54mm HG in hypertensive participants with diabetes, heart failure or chronic kidney disease.;Long Term: Maintenance of blood pressure at goal levels.    Lipids Yes    Intervention Provide education and support for participant on nutrition & aerobic/resistive exercise along with prescribed medications to achieve LDL 70mg , HDL >40mg .    Expected Outcomes Short Term: Participant states understanding of desired cholesterol values and is compliant with medications prescribed. Participant is following exercise prescription and nutrition guidelines.;Long Term: Cholesterol controlled with medications as prescribed, with individualized exercise RX and with personalized nutrition plan. Value goals: LDL < 70mg , HDL > 40 mg.             Tobacco Use Initial Evaluation: Social History   Tobacco Use  Smoking Status Former   Current packs/day: 0.00   Types: Cigarettes   Quit date: 11/11/1996   Years since quitting: 26.5  Smokeless Tobacco Never    Exercise Goals and Review:  Exercise Goals     Row Name 05/20/23 1456             Exercise Goals   Increase Physical Activity Yes       Intervention Provide advice, education, support and counseling about physical activity/exercise needs.;Develop an individualized exercise prescription for aerobic and resistive training based on initial evaluation findings, risk stratification, comorbidities and participant's personal goals.       Expected Outcomes Short Term: Attend rehab on a regular basis to increase amount of physical  activity.;Long Term: Exercising regularly at least 3-5 days a week.;Long Term: Add in home exercise to make exercise part of routine and to increase amount of physical activity.       Increase Strength and Stamina Yes       Intervention Provide advice, education, support and counseling about physical activity/exercise needs.;Develop an individualized exercise prescription for aerobic and resistive training based on initial evaluation findings, risk stratification, comorbidities and participant's personal goals.       Expected Outcomes Short Term: Increase workloads from initial exercise prescription for resistance, speed, and METs.;Short Term: Perform resistance training exercises routinely during rehab and add in resistance training at home;Long Term: Improve cardiorespiratory fitness, muscular endurance and strength as measured by increased METs and functional capacity ( )       Able to understand and use rate of perceived exertion (RPE) scale Yes       Intervention Provide education and explanation on how to use RPE  scale       Expected Outcomes Short Term: Able to use RPE daily in rehab to express subjective intensity level;Long Term:  Able to use RPE to guide intensity level when exercising independently       Able to understand and use Dyspnea scale Yes       Intervention Provide education and explanation on how to use Dyspnea scale       Expected Outcomes Short Term: Able to use Dyspnea scale daily in rehab to express subjective sense of shortness of breath during exertion;Long Term: Able to use Dyspnea scale to guide intensity level when exercising independently       Knowledge and understanding of Target Heart Rate Range (THRR) Yes       Intervention Provide education and explanation of THRR including how the numbers were predicted and where they are located for reference       Expected Outcomes Short Term: Able to state/look up THRR;Long Term: Able to use THRR to govern intensity when  exercising independently;Short Term: Able to use daily as guideline for intensity in rehab       Able to check pulse independently Yes       Intervention Provide education and demonstration on how to check pulse in carotid and radial arteries.;Review the importance of being able to check your own pulse for safety during independent exercise       Expected Outcomes Short Term: Able to explain why pulse checking is important during independent exercise;Long Term: Able to check pulse independently and accurately       Understanding of Exercise Prescription Yes       Intervention Provide education, explanation, and written materials on patient's individual exercise prescription       Expected Outcomes Short Term: Able to explain program exercise prescription;Long Term: Able to explain home exercise prescription to exercise independently

## 2023-05-24 ENCOUNTER — Other Ambulatory Visit: Payer: Self-pay | Admitting: Family Medicine

## 2023-05-24 DIAGNOSIS — M5030 Other cervical disc degeneration, unspecified cervical region: Secondary | ICD-10-CM | POA: Diagnosis not present

## 2023-05-24 DIAGNOSIS — M5412 Radiculopathy, cervical region: Secondary | ICD-10-CM

## 2023-05-25 ENCOUNTER — Encounter: Payer: Medicare Other | Admitting: *Deleted

## 2023-05-25 ENCOUNTER — Other Ambulatory Visit: Payer: Self-pay | Admitting: *Deleted

## 2023-05-25 DIAGNOSIS — Z5189 Encounter for other specified aftercare: Secondary | ICD-10-CM | POA: Diagnosis not present

## 2023-05-25 DIAGNOSIS — D509 Iron deficiency anemia, unspecified: Secondary | ICD-10-CM

## 2023-05-25 DIAGNOSIS — I2089 Other forms of angina pectoris: Secondary | ICD-10-CM

## 2023-05-25 NOTE — Progress Notes (Signed)
Daily Session Note  Patient Details  Name: TIMARION AGCAOILI MRN: [id number] Date of Birth: 1960-12-04 Referring Provider:   Flowsheet Row Cardiac Rehab from 05/20/2023 in [facility] Cardiac and Pulmonary Rehab  Referring Provider Vevelyn Francois, MD       Encounter Date: 05/25/2023  Check In:  Session Check In - 05/25/23 0952       Check-In   Supervising physician immediately available to respond to emergencies See telemetry face sheet for immediately available ER MD    Location [facility]-Cardiac & Pulmonary Rehab    Staff Present Cora Collum, RN, BSN, CCRP;Margaret Best, MS, Exercise Physiologist;Maxon Conetta BS, , Exercise Physiologist;Noah Tickle, BS, Exercise Physiologist    Virtual Visit No    Medication changes reported     No    Fall or balance concerns reported    No    Warm-up and Cool-down Performed on first and last piece of equipment    Resistance Training Performed Yes    VAD Patient? No    PAD/SET Patient? No      Pain Assessment   Currently in Pain? No/denies                Social History   Tobacco Use  Smoking Status Former   Current packs/day: 0.00   Types: Cigarettes   Quit date: 11/11/1996   Years since quitting: 26.5  Smokeless Tobacco Never    Goals Met:  Exercise tolerated well Personal goals reviewed No report of concerns or symptoms today  Goals Unmet:  Not Applicable  Comments: First full day of exercise!  Patient was oriented to gym and equipment including functions, settings, policies, and procedures.  Patient's individual exercise prescription and treatment plan were reviewed.  All starting workloads were established based on the results of the 6 minute walk test done at initial orientation visit.  The plan for exercise progression was also introduced and progression will be customized based on patient's performance and goals.    Dr. Bethann Punches is Medical Director for [facility] Cardiac Rehabilitation.  Dr. Vida Rigger is Medical  Director for [facility] Pulmonary Rehabilitation.

## 2023-05-27 ENCOUNTER — Encounter: Payer: Medicare Other | Admitting: *Deleted

## 2023-05-27 DIAGNOSIS — I2089 Other forms of angina pectoris: Secondary | ICD-10-CM | POA: Diagnosis not present

## 2023-05-27 DIAGNOSIS — Z5189 Encounter for other specified aftercare: Secondary | ICD-10-CM | POA: Diagnosis not present

## 2023-05-27 NOTE — Progress Notes (Signed)
Daily Session Note  Patient Details  Name: Timothy Garrison MRN: [id number] Date of Birth: 1960/10/19 Referring Provider:   Flowsheet Row Cardiac Rehab from 05/20/2023 in [facility] Cardiac and Pulmonary Rehab  Referring Provider Vevelyn Francois, MD       Encounter Date: 05/27/2023  Check In:  Session Check In - 05/27/23 2952       Check-In   Supervising physician immediately available to respond to emergencies See telemetry face sheet for immediately available ER MD    Location [facility]-Cardiac & Pulmonary Rehab    Staff Present Ronette Deter, BS, Exercise Physiologist;Joseph [place], RCP,RRT,BSRT;Maxon [place] BS, , Exercise Physiologist;Keyon Liller Katrinka Blazing, RN, [place]    Virtual Visit No    Medication changes reported     No    Fall or balance concerns reported    No    Warm-up and Cool-down Performed on first and last piece of equipment    Resistance Training Performed Yes    VAD Patient? No    PAD/SET Patient? No      Pain Assessment   Currently in Pain? No/denies                Social History   Tobacco Use  Smoking Status Former   Current packs/day: 0.00   Types: Cigarettes   Quit date: 11/11/1996   Years since quitting: 26.5  Smokeless Tobacco Never    Goals Met:  Independence with exercise equipment Exercise tolerated well No report of concerns or symptoms today Strength training completed today  Goals Unmet:  Not Applicable  Comments: Pt able to follow exercise prescription today without complaint.  Will continue to monitor for progression.    Dr. Bethann Punches is Medical Director for [facility] Cardiac Rehabilitation.  Dr. Vida Rigger is Medical Director for [facility] Pulmonary Rehabilitation.

## 2023-05-31 ENCOUNTER — Ambulatory Visit
Admission: RE | Admit: 2023-05-31 | Discharge: 2023-05-31 | Disposition: A | Discharge status: Home / Self Care | Payer: Medicare Other | Source: Ambulatory Visit | Attending: Family Medicine | Admitting: Family Medicine

## 2023-05-31 ENCOUNTER — Inpatient Hospital Stay: Payer: Medicare Other

## 2023-05-31 VITALS — BP 136/73 | HR 79 | Temp 97.8°F | Resp 20

## 2023-05-31 DIAGNOSIS — Z87891 Personal history of nicotine dependence: Secondary | ICD-10-CM | POA: Diagnosis not present

## 2023-05-31 DIAGNOSIS — M5412 Radiculopathy, cervical region: Secondary | ICD-10-CM | POA: Diagnosis not present

## 2023-05-31 DIAGNOSIS — D509 Iron deficiency anemia, unspecified: Secondary | ICD-10-CM | POA: Diagnosis not present

## 2023-05-31 DIAGNOSIS — D508 Other iron deficiency anemias: Secondary | ICD-10-CM

## 2023-05-31 DIAGNOSIS — Z85818 Personal history of malignant neoplasm of other sites of lip, oral cavity, and pharynx: Secondary | ICD-10-CM | POA: Diagnosis not present

## 2023-05-31 MED ORDER — SODIUM CHLORIDE 0.9 % IV SOLN
INTRAVENOUS | Status: DC
  Filled 2023-05-31: qty 250

## 2023-05-31 MED ORDER — SODIUM CHLORIDE 0.9 % IV SOLN
510.0000 mg | INTRAVENOUS | Status: DC
  Administered 2023-05-31: 510 mg via INTRAVENOUS
  Filled 2023-05-31: qty 510

## 2023-05-31 NOTE — Progress Notes (Signed)
Pt has been educated and understands. Pt declined to stay 30 mins after iron infusion. VSS.

## 2023-06-01 ENCOUNTER — Encounter: Payer: Medicare Other | Admitting: *Deleted

## 2023-06-01 ENCOUNTER — Other Ambulatory Visit: Payer: Self-pay | Admitting: Internal Medicine

## 2023-06-01 DIAGNOSIS — Z5189 Encounter for other specified aftercare: Secondary | ICD-10-CM | POA: Diagnosis not present

## 2023-06-01 DIAGNOSIS — I2089 Other forms of angina pectoris: Secondary | ICD-10-CM | POA: Diagnosis not present

## 2023-06-01 NOTE — Progress Notes (Signed)
Daily Session Note  Patient Details  Name: LAYTHAN HAYTER MRN: [id number] Date of Birth: 04/01/1961 Referring Provider:   Flowsheet Row Cardiac Rehab from 05/20/2023 in [facility] Cardiac and Pulmonary Rehab  Referring Provider Vevelyn Francois, MD       Encounter Date: 06/01/2023  Check In:  Session Check In - 06/01/23 0945       Check-In   Supervising physician immediately available to respond to emergencies See telemetry face sheet for immediately available ER MD    Location [facility]-Cardiac & Pulmonary Rehab    Staff Present Rory Percy, MS, Exercise Physiologist;Derrien Anschutz, RN, BSN, CCRP;Noah Tickle, BS, Exercise Physiologist;Maxon Conetta BS, , Exercise Physiologist    Virtual Visit No    Medication changes reported     No    Fall or balance concerns reported    No    Warm-up and Cool-down Performed on first and last piece of equipment    Resistance Training Performed Yes    VAD Patient? No    PAD/SET Patient? No      Pain Assessment   Currently in Pain? No/denies                Social History   Tobacco Use  Smoking Status Former   Current packs/day: 0.00   Types: Cigarettes   Quit date: 11/11/1996   Years since quitting: 26.5  Smokeless Tobacco Never    Goals Met:  Independence with exercise equipment Exercise tolerated well No report of concerns or symptoms today  Goals Unmet:  Not Applicable  Comments: Pt able to follow exercise prescription today without complaint.  Will continue to monitor for progression.    Dr. Bethann Punches is Medical Director for [facility] Cardiac Rehabilitation.  Dr. Vida Rigger is Medical Director for [facility] Pulmonary Rehabilitation.

## 2023-06-03 ENCOUNTER — Encounter: Payer: Medicare Other | Admitting: *Deleted

## 2023-06-03 DIAGNOSIS — I2089 Other forms of angina pectoris: Secondary | ICD-10-CM

## 2023-06-03 DIAGNOSIS — Z5189 Encounter for other specified aftercare: Secondary | ICD-10-CM | POA: Diagnosis not present

## 2023-06-03 NOTE — Progress Notes (Signed)
Daily Session Note  Patient Details  Name: Timothy Garrison MRN: [id number] Date of Birth: 08-Apr-1961 Referring Provider:   Flowsheet Row Cardiac Rehab from 05/20/2023 in [facility] Cardiac and Pulmonary Rehab  Referring Provider Vevelyn Francois, MD       Encounter Date: 06/03/2023  Check In:  Session Check In - 06/03/23 0927       Check-In   Supervising physician immediately available to respond to emergencies See telemetry face sheet for immediately available ER MD    Location [facility]-Cardiac & Pulmonary Rehab    Staff Present Cora Collum, RN, BSN, CCRP;Joseph [place], RCP,RRT,BSRT;Maxon [place] BS, , Exercise Physiologist;Zekiah Caruth Katrinka Blazing, RN, [place]    Virtual Visit No    Medication changes reported     No    Fall or balance concerns reported    No    Warm-up and Cool-down Performed on first and last piece of equipment    Resistance Training Performed Yes    VAD Patient? No    PAD/SET Patient? No      Pain Assessment   Currently in Pain? No/denies                Social History   Tobacco Use  Smoking Status Former   Current packs/day: 0.00   Types: Cigarettes   Quit date: 11/11/1996   Years since quitting: 26.5  Smokeless Tobacco Never    Goals Met:  Independence with exercise equipment Exercise tolerated well No report of concerns or symptoms today Strength training completed today  Goals Unmet:  Not Applicable  Comments: Pt able to follow exercise prescription today without complaint.  Will continue to monitor for progression.    Dr. Bethann Punches is Medical Director for [facility] Cardiac Rehabilitation.  Dr. Vida Rigger is Medical Director for [facility] Pulmonary Rehabilitation.

## 2023-06-07 ENCOUNTER — Other Ambulatory Visit: Payer: Medicare Other

## 2023-06-07 ENCOUNTER — Inpatient Hospital Stay: Payer: Medicare Other

## 2023-06-07 VITALS — BP 153/84 | HR 90 | Temp 97.6°F | Resp 18

## 2023-06-07 DIAGNOSIS — Z87891 Personal history of nicotine dependence: Secondary | ICD-10-CM | POA: Diagnosis not present

## 2023-06-07 DIAGNOSIS — D509 Iron deficiency anemia, unspecified: Secondary | ICD-10-CM | POA: Diagnosis not present

## 2023-06-07 DIAGNOSIS — D508 Other iron deficiency anemias: Secondary | ICD-10-CM

## 2023-06-07 DIAGNOSIS — Z85818 Personal history of malignant neoplasm of other sites of lip, oral cavity, and pharynx: Secondary | ICD-10-CM | POA: Diagnosis not present

## 2023-06-07 MED ORDER — SODIUM CHLORIDE 0.9 % IV SOLN
Freq: Once | INTRAVENOUS | Status: AC
  Filled 2023-06-07: qty 250

## 2023-06-07 MED ORDER — SODIUM CHLORIDE 0.9 % IV SOLN
510.0000 mg | INTRAVENOUS | Status: DC
Start: 2023-06-07 — End: 2023-06-07
  Administered 2023-06-07: 510 mg via INTRAVENOUS
  Filled 2023-06-07: qty 510

## 2023-06-08 ENCOUNTER — Encounter: Payer: Medicare Other | Admitting: *Deleted

## 2023-06-08 ENCOUNTER — Other Ambulatory Visit: Payer: Medicare Other

## 2023-06-08 DIAGNOSIS — I2089 Other forms of angina pectoris: Secondary | ICD-10-CM

## 2023-06-08 DIAGNOSIS — Z5189 Encounter for other specified aftercare: Secondary | ICD-10-CM | POA: Diagnosis not present

## 2023-06-08 NOTE — Progress Notes (Signed)
Daily Session Note  Patient Details  Name: Timothy Garrison MRN: [id number] Date of Birth: 26-Nov-1960 Referring Provider:   Flowsheet Row Cardiac Rehab from 05/20/2023 in [facility] Cardiac and Pulmonary Rehab  Referring Provider Vevelyn Francois, MD       Encounter Date: 06/08/2023  Check In:  Session Check In - 06/08/23 0937       Check-In   Supervising physician immediately available to respond to emergencies See telemetry face sheet for immediately available ER MD    Location [facility]-Cardiac & Pulmonary Rehab    Staff Present Cora Collum, RN, BSN, CCRP;Margaret Best, MS, Exercise Physiologist;Noah Tickle, BS, Exercise Physiologist;Maxon Conetta BS, , Exercise Physiologist    Virtual Visit No    Medication changes reported     No    Fall or balance concerns reported    No    Warm-up and Cool-down Performed on first and last piece of equipment    Resistance Training Performed Yes    VAD Patient? No    PAD/SET Patient? No      Pain Assessment   Currently in Pain? No/denies                Social History   Tobacco Use  Smoking Status Former   Current packs/day: 0.00   Types: Cigarettes   Quit date: 11/11/1996   Years since quitting: 26.5  Smokeless Tobacco Never    Goals Met:  Independence with exercise equipment Exercise tolerated well No report of concerns or symptoms today  Goals Unmet:  Not Applicable  Comments: Pt able to follow exercise prescription today without complaint.  Will continue to monitor for progression.    Dr. Bethann Punches is Medical Director for [facility] Cardiac Rehabilitation.  Dr. Vida Rigger is Medical Director for [facility] Pulmonary Rehabilitation.

## 2023-06-10 ENCOUNTER — Encounter: Payer: Medicare Other | Admitting: *Deleted

## 2023-06-10 DIAGNOSIS — I2089 Other forms of angina pectoris: Secondary | ICD-10-CM | POA: Diagnosis not present

## 2023-06-10 DIAGNOSIS — Z5189 Encounter for other specified aftercare: Secondary | ICD-10-CM | POA: Diagnosis not present

## 2023-06-10 NOTE — Progress Notes (Signed)
Daily Session Note  Patient Details  Name: Timothy Garrison MRN: [id number] Date of Birth: [birth year] Referring Provider:   Flowsheet Row Cardiac Rehab from 05/20/2023 in [facility] Cardiac and Pulmonary Rehab  Referring Provider Vevelyn Francois, MD       Encounter Date: 06/10/2023  Check In:  Session Check In - 06/10/23 0929       Check-In   Supervising physician immediately available to respond to emergencies See telemetry face sheet for immediately available ER MD    Location [facility]-Cardiac & Pulmonary Rehab    Staff Present Cora Collum, RN, BSN, CCRP;Joseph Hood, RCP,RRT,BSRT;Maxon [place] BS, , Exercise Physiologist;Libbey Duce Katrinka Blazing, RN, [place]    Virtual Visit No    Medication changes reported     No    Fall or balance concerns reported    No    Warm-up and Cool-down Performed on first and last piece of equipment    Resistance Training Performed Yes    VAD Patient? No    PAD/SET Patient? No      Pain Assessment   Currently in Pain? No/denies                Social History   Tobacco Use  Smoking Status Former   Current packs/day: 0.00   Types: Cigarettes   Quit date: 11/11/1996   Years since quitting: 26.5  Smokeless Tobacco Never    Goals Met:  Independence with exercise equipment Exercise tolerated well No report of concerns or symptoms today Strength training completed today  Goals Unmet:  Not Applicable  Comments: Pt able to follow exercise prescription today without complaint.  Will continue to monitor for progression.    Dr. Bethann Punches is Medical Director for [facility] Cardiac Rehabilitation.  Dr. Vida Rigger is Medical Director for [facility] Pulmonary Rehabilitation.

## 2023-06-15 ENCOUNTER — Encounter: Payer: Medicare Other | Admitting: *Deleted

## 2023-06-15 DIAGNOSIS — I2089 Other forms of angina pectoris: Secondary | ICD-10-CM

## 2023-06-15 DIAGNOSIS — Z5189 Encounter for other specified aftercare: Secondary | ICD-10-CM | POA: Diagnosis not present

## 2023-06-15 NOTE — Progress Notes (Signed)
Daily Session Note  Patient Details  Name: Timothy Garrison MRN: [id number] Date of Birth: 24-Feb-1961 Referring Provider:   Flowsheet Row Cardiac Rehab from 05/20/2023 in [facility] Cardiac and Pulmonary Rehab  Referring Provider Vevelyn Francois, MD       Encounter Date: 06/15/2023  Check In:  Session Check In - 06/15/23 0948       Check-In   Supervising physician immediately available to respond to emergencies See telemetry face sheet for immediately available ER MD    Location [facility]-Cardiac & Pulmonary Rehab    Staff Present Rory Percy, MS, Exercise Physiologist;Emi Lymon, RN, BSN, CCRP;Noah Tickle, BS, Exercise Physiologist;Maxon Conetta BS, , Exercise Physiologist    Virtual Visit No    Medication changes reported     No    Fall or balance concerns reported    No    Warm-up and Cool-down Performed on first and last piece of equipment    Resistance Training Performed Yes    VAD Patient? No    PAD/SET Patient? No      Pain Assessment   Currently in Pain? No/denies                Social History   Tobacco Use  Smoking Status Former   Current packs/day: 0.00   Types: Cigarettes   Quit date: 11/11/1996   Years since quitting: 26.6  Smokeless Tobacco Never    Goals Met:  Independence with exercise equipment Exercise tolerated well No report of concerns or symptoms today  Goals Unmet:  Not Applicable  Comments: Pt able to follow exercise prescription today without complaint.  Will continue to monitor for progression.    Dr. Bethann Punches is Medical Director for [facility] Cardiac Rehabilitation.  Dr. Vida Rigger is Medical Director for [facility] Pulmonary Rehabilitation.

## 2023-06-16 ENCOUNTER — Encounter: Payer: Self-pay | Admitting: *Deleted

## 2023-06-16 DIAGNOSIS — M47812 Spondylosis without myelopathy or radiculopathy, cervical region: Secondary | ICD-10-CM | POA: Diagnosis not present

## 2023-06-16 DIAGNOSIS — I2089 Other forms of angina pectoris: Secondary | ICD-10-CM

## 2023-06-16 DIAGNOSIS — M5412 Radiculopathy, cervical region: Secondary | ICD-10-CM | POA: Diagnosis not present

## 2023-06-16 NOTE — Progress Notes (Signed)
Cardiac Individual Treatment Plan  Patient Details  Name: Timothy Garrison MRN: [id number] Date of Birth: [birth year] Referring Provider:   Flowsheet Row Cardiac Rehab from 05/20/2023 in [facility] Cardiac and Pulmonary Rehab  Referring Provider Vevelyn Francois, MD       Initial Encounter Date:  Flowsheet Row Cardiac Rehab from 05/20/2023 in [facility] Cardiac and Pulmonary Rehab  Date 05/20/23       Visit Diagnosis: Chronic stable angina (HCC)  Patient's Home Medications on Admission:  Current Outpatient Medications:    apixaban (ELIQUIS) 5 MG TABS tablet, Take 1 tablet (5 mg total) by mouth every 12 (twelve) hours., Disp: 180 tablet, Rfl: 1   aspirin EC 81 MG tablet, Take 81 mg by mouth daily. Swallow whole., Disp: , Rfl:    busPIRone (BUSPAR) 5 MG tablet, Take 1 tablet (5 mg total) by mouth daily as needed., Disp: 30 tablet, Rfl: 1   lisinopril (ZESTRIL) 40 MG tablet, Take 1 tablet (40 mg total) by mouth daily., Disp: 90 tablet, Rfl: 3   Multiple Vitamin (MULTIVITAMIN) capsule, Take by mouth., Disp: , Rfl:    nitroGLYCERIN (NITROSTAT) 0.4 MG SL tablet, Place 0.4 mg under the tongue every 5 (five) minutes as needed for chest pain., Disp: , Rfl:    omeprazole (PRILOSEC) 20 MG capsule, Take 1 capsule (20 mg total) by mouth 2 (two) times daily., Disp: 180 capsule, Rfl: 3   pravastatin (PRAVACHOL) 40 MG tablet, Take 1 tablet by mouth daily., Disp: , Rfl:    REPATHA SURECLICK 140 MG/ML SOAJ, INJECT 140 MG INTO THE SKIN EVERY 14 DAYS, Disp: 2 mL, Rfl: 2  Past Medical History: Past Medical History:  Diagnosis Date   Basal cell carcinoma (BCC) of back    Basal cell carcinoma (BCC) of nasal sidewall    Dizziness    GERD (gastroesophageal reflux disease)    H/O blood clots    Headache    migraines   Hypercholesterolemia    Hypertension    hx of HBP readings   Metastatic squamous cell carcinoma 2018   bx cervical lymph node   Seizures (HCC) 2021   Tonsillar cancer (HCC)    Vascular  abnormality    VTE (venous thromboembolism) 2018   associated with chemotherapy    Tobacco Use: Social History   Tobacco Use  Smoking Status Former   Current packs/day: 0.00   Types: Cigarettes   Quit date: 11/11/1996   Years since quitting: 26.6  Smokeless Tobacco Never    Labs: Review Flowsheet  More data exists      Latest Ref Rng & Units 10/30/2021 03/04/2022 06/05/2022 09/08/2022 01/07/2023  Labs for ITP Cardiac and Pulmonary Rehab  Cholestrol 0 - 200 mg/dL 086  578  469  629  528   LDL (calc) 0 - 99 mg/dL 413  244  010  - 32   Direct LDL mg/dL - - - 272.5  -  HDL-C >36.64 mg/dL 40.34  74.25  95.63  87.56  44.30   Trlycerides 0.0 - 149.0 mg/dL 433.2  951.8  841.6  606.3  121.0     Details             Exercise Target Goals: Exercise Program Goal: Individual exercise prescription set using results from initial 6 min walk test and THRR while considering  patient's activity barriers and safety.   Exercise Prescription Goal: Initial exercise prescription builds to 30-45 minutes a day of aerobic activity, 2-3 days per week.  Home exercise guidelines will  be given to patient during program as part of exercise prescription that the participant will acknowledge.   Education: Aerobic Exercise: - Group verbal and visual presentation on the components of exercise prescription. Introduces F.I.T.T principle from ACSM for exercise prescriptions.  Reviews F.I.T.T. principles of aerobic exercise including progression. Written material given at graduation. Flowsheet Row Cardiac Rehab from 05/27/2023 in [facility] Cardiac and Pulmonary Rehab  Education need identified 05/20/23       Education: Resistance Exercise: - Group verbal and visual presentation on the components of exercise prescription. Introduces F.I.T.T principle from ACSM for exercise prescriptions  Reviews F.I.T.T. principles of resistance exercise including progression. Written material given at graduation.    Education:  Exercise & Equipment Safety: - Individual verbal instruction and demonstration of equipment use and safety with use of the equipment. Flowsheet Row Cardiac Rehab from 05/27/2023 in [facility] Cardiac and Pulmonary Rehab  Date 05/20/23  Educator MB  Instruction Review Code 1- Verbalizes Understanding       Education: Exercise Physiology & General Exercise Guidelines: - Group verbal and written instruction with models to review the exercise physiology of the cardiovascular system and associated critical values. Provides general exercise guidelines with specific guidelines to those with heart or lung disease.    Education: Flexibility, Balance, Mind/Body Relaxation: - Group verbal and visual presentation with interactive activity on the components of exercise prescription. Introduces F.I.T.T principle from ACSM for exercise prescriptions. Reviews F.I.T.T. principles of flexibility and balance exercise training including progression. Also discusses the mind body connection.  Reviews various relaxation techniques to help reduce and manage stress (i.e. Deep breathing, progressive muscle relaxation, and visualization). Balance handout provided to take home. Written material given at graduation.   Activity Barriers & Risk Stratification:  Activity Barriers & Cardiac Risk Stratification - 05/20/23 1453       Activity Barriers & Cardiac Risk Stratification   Activity Barriers Other (comment);Neck/Spine Problems    Comments leg pain with walking    Cardiac Risk Stratification High             6 Minute Walk:  6 Minute Walk     Row Name 05/20/23 1452         6 Minute Walk   Phase Initial     Distance 1330 feet     Walk Time 6 minutes     # of Rest Breaks 0     MPH 2.52     METS 3.9     RPE 12     Perceived Dyspnea  0     VO2 Peak 13.64     Symptoms Yes (comment)     Comments 4/10 Leg pain     Resting HR 85 bpm     Resting BP 146/86     Resting Oxygen Saturation  97 %     Exercise  Oxygen Saturation  during 6 min walk 99 %     Max Ex. HR 107 bpm     Max Ex. BP 170/92     2 Minute Post BP 158/92              Oxygen Initial Assessment:   Oxygen Re-Evaluation:   Oxygen Discharge (Final Oxygen Re-Evaluation):   Initial Exercise Prescription:  Initial Exercise Prescription - 05/20/23 1400       Date of Initial Exercise RX and Referring Provider   Date 05/20/23    Referring Provider Vevelyn Francois, MD      Oxygen   Maintain Oxygen Saturation 88% or  higher      Treadmill   MPH 2.5    Grade 0    Minutes 15    METs 2.91      Recumbant Bike   Level 3    RPM 50    Watts 45    Minutes 15    METs 3.9      NuStep   Level 3    SPM 80    Minutes 15    METs 3.9      Elliptical   Level 2    Speed 3    Minutes 15    METs 3.9      Prescription Details   Frequency (times per week) 2    Duration Progress to 30 minutes of continuous aerobic without signs/symptoms of physical distress      Intensity   THRR 40-80% of Max Heartrate 114-143    Ratings of Perceived Exertion 11-13    Perceived Dyspnea 0-4      Progression   Progression Continue to progress workloads to maintain intensity without signs/symptoms of physical distress.      Resistance Training   Training Prescription Yes    Weight 5 lb    Reps 10-15             Perform Capillary Blood Glucose checks as needed.  Exercise Prescription Changes:   Exercise Prescription Changes     Row Name 05/20/23 1400 06/04/23 0800           Response to Exercise   Blood Pressure (Admit) 146/86 122/70      Blood Pressure (Exercise) 170/92 164/86      Blood Pressure (Exit) 146/82 124/78      Heart Rate (Admit) 85 bpm 93 bpm      Heart Rate (Exercise) 107 bpm 132 bpm      Heart Rate (Exit) 72 bpm 111 bpm      Oxygen Saturation (Admit) 97 % --      Oxygen Saturation (Exercise) 99 % --      Oxygen Saturation (Exit) 99 % --      Rating of Perceived Exertion (Exercise) 12 13       Perceived Dyspnea (Exercise) 0 --      Symptoms 4/10 leg pain none      Comments results First two days of exercise      Duration Progress to 30 minutes of  aerobic without signs/symptoms of physical distress Progress to 30 minutes of  aerobic without signs/symptoms of physical distress      Intensity THRR New THRR unchanged        Progression   Progression Continue to progress workloads to maintain intensity without signs/symptoms of physical distress. Continue to progress workloads to maintain intensity without signs/symptoms of physical distress.      Average METs 3.9 3.05        Resistance Training   Training Prescription -- Yes      Weight -- 5 lb      Reps -- 10-15        Interval Training   Interval Training -- No        Treadmill   MPH -- 2.2      Grade -- 0      Minutes -- 30      METs -- 2.69        Recumbant Bike   Level -- 5      Watts -- 37      Minutes -- 15  METs -- 3.57        NuStep   Level -- 3      Minutes -- 15      METs -- 3.1        Oxygen   Maintain Oxygen Saturation -- 88% or higher               Exercise Comments:   Exercise Comments     Row Name 05/25/23 0953           Exercise Comments First full day of exercise!  Patient was oriented to gym and equipment including functions, settings, policies, and procedures.  Patient's individual exercise prescription and treatment plan were reviewed.  All starting workloads were established based on the results of the 6 minute walk test done at initial orientation visit.  The plan for exercise progression was also introduced and progression will be customized based on patient's performance and goals.                Exercise Goals and Review:   Exercise Goals     Row Name 05/20/23 1456             Exercise Goals   Increase Physical Activity Yes       Intervention Provide advice, education, support and counseling about physical activity/exercise needs.;Develop an  individualized exercise prescription for aerobic and resistive training based on initial evaluation findings, risk stratification, comorbidities and participant's personal goals.       Expected Outcomes Short Term: Attend rehab on a regular basis to increase amount of physical activity.;Long Term: Exercising regularly at least 3-5 days a week.;Long Term: Add in home exercise to make exercise part of routine and to increase amount of physical activity.       Increase Strength and Stamina Yes       Intervention Provide advice, education, support and counseling about physical activity/exercise needs.;Develop an individualized exercise prescription for aerobic and resistive training based on initial evaluation findings, risk stratification, comorbidities and participant's personal goals.       Expected Outcomes Short Term: Increase workloads from initial exercise prescription for resistance, speed, and METs.;Short Term: Perform resistance training exercises routinely during rehab and add in resistance training at home;Long Term: Improve cardiorespiratory fitness, muscular endurance and strength as measured by increased METs and functional capacity ( )       Able to understand and use rate of perceived exertion (RPE) scale Yes       Intervention Provide education and explanation on how to use RPE scale       Expected Outcomes Short Term: Able to use RPE daily in rehab to express subjective intensity level;Long Term:  Able to use RPE to guide intensity level when exercising independently       Able to understand and use Dyspnea scale Yes       Intervention Provide education and explanation on how to use Dyspnea scale       Expected Outcomes Short Term: Able to use Dyspnea scale daily in rehab to express subjective sense of shortness of breath during exertion;Long Term: Able to use Dyspnea scale to guide intensity level when exercising independently       Knowledge and understanding of Target Heart Rate Range  (THRR) Yes       Intervention Provide education and explanation of THRR including how the numbers were predicted and where they are located for reference       Expected Outcomes Short Term: Able to state/look up THRR;Long  Term: Able to use THRR to govern intensity when exercising independently;Short Term: Able to use daily as guideline for intensity in rehab       Able to check pulse independently Yes       Intervention Provide education and demonstration on how to check pulse in carotid and radial arteries.;Review the importance of being able to check your own pulse for safety during independent exercise       Expected Outcomes Short Term: Able to explain why pulse checking is important during independent exercise;Long Term: Able to check pulse independently and accurately       Understanding of Exercise Prescription Yes       Intervention Provide education, explanation, and written materials on patient's individual exercise prescription       Expected Outcomes Short Term: Able to explain program exercise prescription;Long Term: Able to explain home exercise prescription to exercise independently                Exercise Goals Re-Evaluation :  Exercise Goals Re-Evaluation     Row Name 05/25/23 0953 06/04/23 0803           Exercise Goal Re-Evaluation   Exercise Goals Review Able to understand and use rate of perceived exertion (RPE) scale;Knowledge and understanding of Target Heart Rate Range (THRR);Able to understand and use Dyspnea scale;Understanding of Exercise Prescription Increase Physical Activity;Understanding of Exercise Prescription;Increase Strength and Stamina      Comments Reviewed RPE and dyspnea scale, THR and program prescription with pt today.  Pt voiced understanding and was given a copy of goals to take home. Timothy Garrison is off to a good start in the program. He was able to walk the treadmill at a speed of 2.2 mph with no incline. He also worked at level 3 on the T4 nustep and  level 5 on the recumbent bike. He was able to tolerate 5 lb hand weights for resistance training as well. We will continue to monitor his progress in the program.      Expected Outcomes Short: Use RPE daily to regulate intensity. Long: Follow program prescription in THR. Short: Continue to follow initial exercise prescription. Long: Continue exercise to improve strength and stamina.               Discharge Exercise Prescription (Final Exercise Prescription Changes):  Exercise Prescription Changes - 06/04/23 0800       Response to Exercise   Blood Pressure (Admit) 122/70    Blood Pressure (Exercise) 164/86    Blood Pressure (Exit) 124/78    Heart Rate (Admit) 93 bpm    Heart Rate (Exercise) 132 bpm    Heart Rate (Exit) 111 bpm    Rating of Perceived Exertion (Exercise) 13    Symptoms none    Comments First two days of exercise    Duration Progress to 30 minutes of  aerobic without signs/symptoms of physical distress    Intensity THRR unchanged      Progression   Progression Continue to progress workloads to maintain intensity without signs/symptoms of physical distress.    Average METs 3.05      Resistance Training   Training Prescription Yes    Weight 5 lb    Reps 10-15      Interval Training   Interval Training No      Treadmill   MPH 2.2    Grade 0    Minutes 30    METs 2.69      Recumbant Bike   Level  5    Watts 37    Minutes 15    METs 3.57      NuStep   Level 3    Minutes 15    METs 3.1      Oxygen   Maintain Oxygen Saturation 88% or higher             Nutrition:  Target Goals: Understanding of nutrition guidelines, daily intake of sodium 1500mg , cholesterol 200mg , calories 30% from fat and 7% or less from saturated fats, daily to have 5 or more servings of fruits and vegetables.  Education: All About Nutrition: -Group instruction provided by verbal, written material, interactive activities, discussions, models, and posters to present general  guidelines for heart healthy nutrition including fat, fiber, MyPlate, the role of sodium in heart healthy nutrition, utilization of the nutrition label, and utilization of this knowledge for meal planning. Follow up email sent as well. Written material given at graduation. Flowsheet Row Cardiac Rehab from 05/27/2023 in [facility] Cardiac and Pulmonary Rehab  Education need identified 05/20/23       Biometrics:  Pre Biometrics - 05/20/23 1457       Pre Biometrics   Height 5' 7.5" (1.715 m)    Weight 164 lb 6.4 oz (74.6 kg)    Waist Circumference 36.6 inches    Hip Circumference 38 inches    Waist to Hip Ratio 0.96 %    BMI (Calculated) 25.35    Single Leg Stand 21 seconds              Nutrition Therapy Plan and Nutrition Goals:  Nutrition Therapy & Goals - 05/20/23 1206       Nutrition Therapy   Diet Cardiac, low na    Protein (specify units) 90    Fiber 30 grams    Whole Grain Foods 3 servings    Saturated Fats 15 max. grams    Fruits and Vegetables 5 servings/day    Sodium 2 grams      Personal Nutrition Goals   Nutrition Goal Choose complex carbs and pair with healthy protein    Personal Goal #2 Include colorful produce at large meals or when eating out    Personal Goal #3 Make better snack choices    Comments Patient drinking mostly water. Sweet tea a few times a week, 6pack of beer per week. He is happy with the reductions he has made before coming to Rehab in these areas. Commended him on these changes and encouraged him to continue to work on them. He reports he eats 3 meals per day, wife cooks most of his food. He reports she is health conscious and has been helping him to reduce sodium, saturated fat, and sugary foods. Reviewed mediterranean diet handout, educated on types of fats, sources, and how to read a label. Reminded him to watch his sodium intake and choose low sodium varieties of foods he likes. He asked about iron as well. Spoke with him about using iron  fortified foods like cereal, using Vitamin C to help absorption and avoiding competing nutrients like calcium at the same times. Built out several meals and snacks with foods he likes with focus on including more colorful produce and complex carbs paired with healthy protein.      Intervention Plan   Intervention Prescribe, educate and counsel regarding individualized specific dietary modifications aiming towards targeted core components such as weight, hypertension, lipid management, diabetes, heart failure and other comorbidities.;Nutrition handout(s) given to patient.    Expected  Outcomes Short Term Goal: Understand basic principles of dietary content, such as calories, fat, sodium, cholesterol and nutrients.;Short Term Goal: A plan has been developed with personal nutrition goals set during dietitian appointment.;Long Term Goal: Adherence to prescribed nutrition plan.             Nutrition Assessments:  MEDIFICTS Score Key: >=70 Need to make dietary changes  40-70 Heart Healthy Diet <= 40 Therapeutic Level Cholesterol Diet  Flowsheet Row Cardiac Rehab from 05/20/2023 in [facility] Cardiac and Pulmonary Rehab  Picture Your Plate Total Score on Admission 67      Picture Your Plate Scores: <75 Unhealthy dietary pattern with much room for improvement. 41-50 Dietary pattern unlikely to meet recommendations for good health and room for improvement. 51-60 More healthful dietary pattern, with some room for improvement.  >60 Healthy dietary pattern, although there may be some specific behaviors that could be improved.    Nutrition Goals Re-Evaluation:  Nutrition Goals Re-Evaluation     Row Name 06/15/23 1043             Goals   Comment Timothy Garrison states that his wife has helped him with his diet. He is working on getting more protein primarily through chicken and has cut out red meats from his diet at this time. He is also working on drinking plenty of water throughout the day.        Expected Outcome Short: Continue to work on protein intake. Long: Continue to work on heart healthy eating patterns discussed with RD.                Nutrition Goals Discharge (Final Nutrition Goals Re-Evaluation):  Nutrition Goals Re-Evaluation - 06/15/23 1043       Goals   Comment Timothy Garrison states that his wife has helped him with his diet. He is working on getting more protein primarily through chicken and has cut out red meats from his diet at this time. He is also working on drinking plenty of water throughout the day.    Expected Outcome Short: Continue to work on protein intake. Long: Continue to work on heart healthy eating patterns discussed with RD.             Psychosocial: Target Goals: Acknowledge presence or absence of significant depression and/or stress, maximize coping skills, provide positive support system. Participant is able to verbalize types and ability to use techniques and skills needed for reducing stress and depression.   Education: Stress, Anxiety, and Depression - Group verbal and visual presentation to define topics covered.  Reviews how body is impacted by stress, anxiety, and depression.  Also discusses healthy ways to reduce stress and to treat/manage anxiety and depression.  Written material given at graduation. Flowsheet Row Cardiac Rehab from 05/27/2023 in [facility] Cardiac and Pulmonary Rehab  Date 05/27/23  Educator SB  Instruction Review Code 1- [employer] Understanding       Education: Sleep Hygiene -Provides group verbal and written instruction about how sleep can affect your health.  Define sleep hygiene, discuss sleep cycles and impact of sleep habits. Review good sleep hygiene tips.    Initial Review & Psychosocial Screening:  Initial Psych Review & Screening - 05/06/23 1337       Initial Review   Current issues with None Identified      Family Dynamics   Good Support System? Yes   wife, friends     Barriers   Psychosocial barriers  to participate in program There are no identifiable barriers  or psychosocial needs.      Screening Interventions   Interventions Encouraged to exercise;To provide support and resources with identified psychosocial needs    Expected Outcomes Short Term goal: Utilizing psychosocial counselor, staff and physician to assist with identification of specific Stressors or current issues interfering with healing process. Setting desired goal for each stressor or current issue identified.;Long Term Goal: Stressors or current issues are controlled or eliminated.;Short Term goal: Identification and review with participant of any Quality of Life or Depression concerns found by scoring the questionnaire.;Long Term goal: The participant improves quality of Life and PHQ9 Scores as seen by post scores and/or verbalization of changes             Quality of Life Scores:   Quality of Life - 05/20/23 1458       Quality of Life   Select Quality of Life      Quality of Life Scores   Health/Function Pre 22.7 %    Socioeconomic Pre 23.56 %    Psych/Spiritual Pre 24.86 %    Family Pre 27.6 %    GLOBAL Pre 24.03 %            Scores of 19 and below usually indicate a poorer quality of life in these areas.  A difference of  2-3 points is a clinically meaningful difference.  A difference of 2-3 points in the total score of the Quality of Life Index has been associated with significant improvement in overall quality of life, self-image, physical symptoms, and general health in studies assessing change in quality of life.  PHQ-9: Review Flowsheet  More data exists      05/25/2023 06/22/2022 03/04/2022 06/20/2021 05/08/2020  Depression screen PHQ 2/9  Decreased Interest 0 0 0 0 0  Down, Depressed, Hopeless 0 0 0 0 0  PHQ - 2 Score 0 0 0 0 0  Altered sleeping 1 - - - -  Tired, decreased energy 1 - - - -  Change in appetite 0 - - - -  Feeling bad or failure about yourself  0 - - - -  Trouble concentrating 0 -  - - -  Moving slowly or fidgety/restless 0 - - - -  Suicidal thoughts 0 - - - -  PHQ-9 Score 2 - - - -  Difficult doing work/chores Not difficult at all - - - -    Details           Interpretation of Total Score  Total Score Depression Severity:  1-4 = Minimal depression, 5-9 = Mild depression, 10-14 = Moderate depression, 15-19 = Moderately severe depression, 20-27 = Severe depression   Psychosocial Evaluation and Intervention:  Psychosocial Evaluation - 05/06/23 1355       Psychosocial Evaluation & Interventions   Interventions Encouraged to exercise with the program and follow exercise prescription    Comments Kirt is coming to cardiac rehab with stable angina. He meets with his cardiologist this week to discuss options for his blockage. He currently does not struggle with chest pain but knows what medication to take if he starts. 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 His wife and friends are a great support system for him. He does have leg pain with walking which can hinder his activites but he states that he just rests some and then gets back to what he was doing. He mentions he has a positive outlook and is ready to start the program to learn what he can/should be  doing.    Expected Outcomes Short: attend cardiac rehab for education and exercise. Long: Develop and maintain positive self care habits.    Continue Psychosocial Services  Follow up required by staff             Psychosocial Re-Evaluation:  Psychosocial Re-Evaluation     Row Name 06/15/23 1039             Psychosocial Re-Evaluation   Current issues with None Identified;Current Sleep Concerns       Comments Timothy Garrison reports no major stressors at this time. 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 He also states that exercise has beneficial for helping him learn his limits. His wife and friends make up a good support system for him. He has not been sleeping well as he wakes up  throughout the night, but he states this is his normal.       Expected Outcomes Short: Continue to relieve stress through healthy avenues. Long: Continue to maintain positive outlook.       Interventions Encouraged to attend Cardiac Rehabilitation for the exercise       Continue Psychosocial Services  Follow up required by staff                Psychosocial Discharge (Final Psychosocial Re-Evaluation):  Psychosocial Re-Evaluation - 06/15/23 1039       Psychosocial Re-Evaluation   Current issues with None Identified;Current Sleep Concerns    Comments Timothy Garrison reports no major stressors at this time. 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 He also states that exercise has beneficial for helping him learn his limits. His wife and friends make up a good support system for him. He has not been sleeping well as he wakes up throughout the night, but he states this is his normal.    Expected Outcomes Short: Continue to relieve stress through healthy avenues. Long: Continue to maintain positive outlook.    Interventions Encouraged to attend Cardiac Rehabilitation for the exercise    Continue Psychosocial Services  Follow up required by staff             Vocational Rehabilitation: Provide vocational rehab assistance to qualifying candidates.   Vocational Rehab Evaluation & Intervention:  Vocational Rehab - 05/06/23 1336       Initial Vocational Rehab Evaluation & Intervention   Assessment shows need for Vocational Rehabilitation No             Education: Education Goals: Education classes will be provided on a variety of topics geared toward better understanding of heart health and risk factor modification. Participant will state understanding/return demonstration of topics presented as noted by education test scores.  Learning Barriers/Preferences:  Learning Barriers/Preferences - 05/06/23 1336       Learning Barriers/Preferences   Learning Barriers  None    Learning Preferences None             General Cardiac Education Topics:  AED/CPR: - Group verbal and written instruction with the use of models to demonstrate the basic use of the AED with the basic ABC's of resuscitation.   Anatomy and Cardiac Procedures: - Group verbal and visual presentation and models provide information about basic cardiac anatomy and function. Reviews the testing methods done to diagnose heart disease and the outcomes of the test results. Describes the treatment choices: Medical Management, Angioplasty, or Coronary Bypass Surgery for treating various heart conditions including Myocardial Infarction, Angina, Valve  Disease, and Cardiac Arrhythmias.  Written material given at graduation.   Medication Safety: - Group verbal and visual instruction to review commonly prescribed medications for heart and lung disease. Reviews the medication, class of the drug, and side effects. Includes the steps to properly store meds and maintain the prescription regimen.  Written material given at graduation.   Intimacy: - Group verbal instruction through game format to discuss how heart and lung disease can affect sexual intimacy. Written material given at graduation..   Know Your Numbers and Heart Failure: - Group verbal and visual instruction to discuss disease risk factors for cardiac and pulmonary disease and treatment options.  Reviews associated critical values for Overweight/Obesity, Hypertension, Cholesterol, and Diabetes.  Discusses basics of heart failure: signs/symptoms and treatments.  Introduces Heart Failure Zone chart for action plan for heart failure.  Written material given at graduation.   Infection Prevention: - Provides verbal and written material to individual with discussion of infection control including proper hand washing and proper equipment cleaning during exercise session. Flowsheet Row Cardiac Rehab from 05/27/2023 in [facility] Cardiac and Pulmonary  Rehab  Date 05/20/23  Educator MB  Instruction Review Code 1- Verbalizes Understanding       Falls Prevention: - Provides verbal and written material to individual with discussion of falls prevention and safety. Flowsheet Row Cardiac Rehab from 05/27/2023 in [facility] Cardiac and Pulmonary Rehab  Date 05/20/23  Educator MB  Instruction Review Code 1- Verbalizes Understanding       Other: -Provides group and verbal instruction on various topics (see comments)   Knowledge Questionnaire Score:  Knowledge Questionnaire Score - 05/20/23 1500       Knowledge Questionnaire Score   Pre Score 24/26             Core Components/Risk Factors/Patient Goals at Admission:  Personal Goals and Risk Factors at Admission - 05/20/23 1501       Core Components/Risk Factors/Patient Goals on Admission    Weight Management Yes;Weight Maintenance    Intervention Weight Management: Develop a combined nutrition and exercise program designed to reach desired caloric intake, while maintaining appropriate intake of nutrient and fiber, sodium and fats, and appropriate energy expenditure required for the weight goal.;Weight Management: Provide education and appropriate resources to help participant work on and attain dietary goals.;Weight Management/Obesity: Establish reasonable short term and long term weight goals.    Admit Weight 164 lb 6.4 oz (74.6 kg)    Goal Weight: Short Term 164 lb 6.4 oz (74.6 kg)    Goal Weight: Long Term 164 lb 6.4 oz (74.6 kg)    Expected Outcomes Short Term: Continue to assess and modify interventions until short term weight is achieved;Long Term: Adherence to nutrition and physical activity/exercise program aimed toward attainment of established weight goal;Weight Maintenance: Understanding of the daily nutrition guidelines, which includes 25-35% calories from fat, 7% or less cal from saturated fats, less than 200mg  cholesterol, less than 1.5gm of sodium, & 5 or more servings  of fruits and vegetables daily;Understanding recommendations for meals to include 15-35% energy as protein, 25-35% energy from fat, 35-60% energy from carbohydrates, less than 200mg  of dietary cholesterol, 20-35 gm of total fiber daily;Understanding of distribution of calorie intake throughout the day with the consumption of 4-5 meals/snacks    Hypertension Yes    Intervention Provide education on lifestyle modifcations including regular physical activity/exercise, weight management, moderate sodium restriction and increased consumption of fresh fruit, vegetables, and low fat dairy, alcohol moderation, and smoking cessation.;Monitor prescription  use compliance.    Expected Outcomes Short Term: Continued assessment and intervention until BP is < 140/49mm HG in hypertensive participants. < 130/69mm HG in hypertensive participants with diabetes, heart failure or chronic kidney disease.;Long Term: Maintenance of blood pressure at goal levels.    Lipids Yes    Intervention Provide education and support for participant on nutrition & aerobic/resistive exercise along with prescribed medications to achieve LDL 70mg , HDL >40mg .    Expected Outcomes Short Term: Participant states understanding of desired cholesterol values and is compliant with medications prescribed. Participant is following exercise prescription and nutrition guidelines.;Long Term: Cholesterol controlled with medications as prescribed, with individualized exercise RX and with personalized nutrition plan. Value goals: LDL < 70mg , HDL > 40 mg.             Education:Diabetes - Individual verbal and written instruction to review signs/symptoms of diabetes, desired ranges of glucose level fasting, after meals and with exercise. Acknowledge that pre and post exercise glucose checks will be done for 3 sessions at entry of program.   Core Components/Risk Factors/Patient Goals Review:   Goals and Risk Factor Review     Row Name 06/15/23 1045              Core Components/Risk Factors/Patient Goals Review   Personal Goals Review Weight Management/Obesity;Hypertension       Review Timothy Garrison is doing well and would like to continue to maintain his weight between 160-165 lbs. He also owns a BP cuff and uses it to check his BP about once or twice a week. He reports that his BP has stayed within normal ranges. He continues to take all of his medications as prescribed.       Expected Outcomes Short: Continue to check BP regularly. Long: Continue to manage lifestyle risk factors.                Core Components/Risk Factors/Patient Goals at Discharge (Final Review):   Goals and Risk Factor Review - 06/15/23 1045       Core Components/Risk Factors/Patient Goals Review   Personal Goals Review Weight Management/Obesity;Hypertension    Review Timothy Garrison is doing well and would like to continue to maintain his weight between 160-165 lbs. He also owns a BP cuff and uses it to check his BP about once or twice a week. He reports that his BP has stayed within normal ranges. He continues to take all of his medications as prescribed.    Expected Outcomes Short: Continue to check BP regularly. Long: Continue to manage lifestyle risk factors.             ITP Comments:  ITP Comments     Row Name 05/06/23 1343 05/20/23 1451 05/25/23 0953 06/16/23 0851     ITP Comments Initial phone call completed. Diagnosis can be found in [facility] 8/30. EP Orientation scheduled for Thursday 10/3 at 9:30. Completed and gym orientation. Initial ITP created and sent for review to Dr. Bethann Punches, Medical Director. First full day of exercise!  Patient was oriented to gym and equipment including functions, settings, policies, and procedures.  Patient's individual exercise prescription and treatment plan were reviewed.  All starting workloads were established based on the results of the 6 minute walk test done at initial orientation visit.  The plan for exercise progression  was also introduced and progression will be customized based on patient's performance and goals. 30 Day review completed. Medical Director ITP review done, changes made as directed, and signed approval by  Medical Director.    new to program             Comments:

## 2023-06-17 ENCOUNTER — Encounter: Payer: Medicare Other | Admitting: *Deleted

## 2023-06-17 DIAGNOSIS — Z5189 Encounter for other specified aftercare: Secondary | ICD-10-CM | POA: Diagnosis not present

## 2023-06-17 DIAGNOSIS — I2089 Other forms of angina pectoris: Secondary | ICD-10-CM | POA: Diagnosis not present

## 2023-06-17 NOTE — Progress Notes (Signed)
Daily Session Note  Patient Details  Name: Timothy Garrison MRN: [id number] Date of Birth: 1961/03/24 Referring Provider:   Flowsheet Row Cardiac Rehab from 05/20/2023 in [facility] Cardiac and Pulmonary Rehab  Referring Provider Vevelyn Francois, MD       Encounter Date: 06/17/2023  Check In:  Session Check In - 06/17/23 0928       Check-In   Supervising physician immediately available to respond to emergencies See telemetry face sheet for immediately available ER MD    Location [facility]-Cardiac & Pulmonary Rehab    Staff Present Cora Collum, RN, BSN, CCRP;Joseph Hood, RCP,RRT,BSRT;Maxon Kearney BS, , Exercise Physiologist;Adamari Frede Katrinka Blazing, RN, [place]    Virtual Visit No    Medication changes reported     No    Fall or balance concerns reported    No    Warm-up and Cool-down Performed on first and last piece of equipment    Resistance Training Performed Yes    VAD Patient? No    PAD/SET Patient? No      Pain Assessment   Currently in Pain? No/denies                Social History   Tobacco Use  Smoking Status Former   Current packs/day: 0.00   Types: Cigarettes   Quit date: 11/11/1996   Years since quitting: 26.6  Smokeless Tobacco Never    Goals Met:  Independence with exercise equipment Exercise tolerated well No report of concerns or symptoms today Strength training completed today  Goals Unmet:  Not Applicable  Comments: Pt able to follow exercise prescription today without complaint.  Will continue to monitor for progression.    Dr. Bethann Punches is Medical Director for [facility] Cardiac Rehabilitation.  Dr. Vida Rigger is Medical Director for [facility] Pulmonary Rehabilitation.

## 2023-06-22 ENCOUNTER — Encounter: Payer: Medicare Other | Attending: Cardiology | Admitting: *Deleted

## 2023-06-22 DIAGNOSIS — Z5189 Encounter for other specified aftercare: Secondary | ICD-10-CM | POA: Insufficient documentation

## 2023-06-22 DIAGNOSIS — I2089 Other forms of angina pectoris: Secondary | ICD-10-CM | POA: Diagnosis not present

## 2023-06-22 NOTE — Progress Notes (Signed)
Daily Session Note  Patient Details  Name: Timothy Garrison MRN: [id number] Date of Birth: 12-15-1960 Referring Provider:   Flowsheet Row Cardiac Rehab from 05/20/2023 in [facility] Cardiac and Pulmonary Rehab  Referring Provider Vevelyn Francois, MD       Encounter Date: 06/22/2023  Check In:  Session Check In - 06/22/23 0930       Check-In   Supervising physician immediately available to respond to emergencies See telemetry face sheet for immediately available ER MD    Location [facility]-Cardiac & Pulmonary Rehab    Staff Present Cora Collum, RN, BSN, CCRP;Margaret Best, MS, Exercise Physiologist;Noah Tickle, BS, Exercise Physiologist;Maxon Conetta BS, , Exercise Physiologist    Virtual Visit No    Medication changes reported     No    Fall or balance concerns reported    No    Warm-up and Cool-down Performed on first and last piece of equipment    Resistance Training Performed Yes    VAD Patient? No    PAD/SET Patient? No      Pain Assessment   Currently in Pain? No/denies                Social History   Tobacco Use  Smoking Status Former   Current packs/day: 0.00   Types: Cigarettes   Quit date: 11/11/1996   Years since quitting: 26.6  Smokeless Tobacco Never    Goals Met:  Independence with exercise equipment Exercise tolerated well No report of concerns or symptoms today  Goals Unmet:  Not Applicable  Comments: Pt able to follow exercise prescription today without complaint.  Will continue to monitor for progression.    Dr. Bethann Punches is Medical Director for [facility] Cardiac Rehabilitation.  Dr. Vida Rigger is Medical Director for [facility] Pulmonary Rehabilitation.

## 2023-06-24 ENCOUNTER — Encounter: Payer: Medicare Other | Admitting: *Deleted

## 2023-06-24 DIAGNOSIS — I2089 Other forms of angina pectoris: Secondary | ICD-10-CM

## 2023-06-24 DIAGNOSIS — Z5189 Encounter for other specified aftercare: Secondary | ICD-10-CM | POA: Diagnosis not present

## 2023-06-24 NOTE — Progress Notes (Signed)
Daily Session Note  Patient Details  Name: ALIOUNE HODGKIN MRN: [id number] Date of Birth: 06/09/1961 Referring Provider:   Flowsheet Row Cardiac Rehab from 05/20/2023 in [facility] Cardiac and Pulmonary Rehab  Referring Provider Vevelyn Francois, MD       Encounter Date: 06/24/2023  Check In:  Session Check In - 06/24/23 0944       Check-In   Supervising physician immediately available to respond to emergencies See telemetry face sheet for immediately available ER MD    Location [facility]-Cardiac & Pulmonary Rehab    Staff Present Ronette Deter, BS, Exercise Physiologist;Susanne Bice, RN, BSN, CCRP;Margaret Best, MS, Exercise Physiologist;Orlean Holtrop Katrinka Blazing, RN, ADN    Virtual Visit No    Medication changes reported     No    Fall or balance concerns reported    No    Warm-up and Cool-down Performed on first and last piece of equipment    Resistance Training Performed Yes    VAD Patient? No    PAD/SET Patient? No      Pain Assessment   Currently in Pain? No/denies                Social History   Tobacco Use  Smoking Status Former   Current packs/day: 0.00   Types: Cigarettes   Quit date: 11/11/1996   Years since quitting: 26.6  Smokeless Tobacco Never    Goals Met:  Independence with exercise equipment Exercise tolerated well No report of concerns or symptoms today Strength training completed today  Goals Unmet:  Not Applicable  Comments: Pt able to follow exercise prescription today without complaint.  Will continue to monitor for progression.    Dr. Bethann Punches is Medical Director for [facility] Cardiac Rehabilitation.  Dr. Vida Rigger is Medical Director for [facility] Pulmonary Rehabilitation.

## 2023-06-25 ENCOUNTER — Ambulatory Visit (INDEPENDENT_AMBULATORY_CARE_PROVIDER_SITE_OTHER): Payer: Medicare Other | Admitting: *Deleted

## 2023-06-25 VITALS — Ht 68.0 in | Wt 163.0 lb

## 2023-06-25 DIAGNOSIS — Z0000 Encounter for general adult medical examination without abnormal findings: Secondary | ICD-10-CM | POA: Diagnosis not present

## 2023-06-25 NOTE — Progress Notes (Signed)
Subjective:   Timothy Garrison is a 62 y.o. male who presents for Medicare Annual/Subsequent preventive examination.  Visit Complete: Virtual I connected with  Timothy Garrison on 06/25/23 by a audio enabled telemedicine application and verified that I am speaking with the correct person using two identifiers.  Patient Location: Home  Provider Location: Home Office  I discussed the limitations of evaluation and management by telemedicine. The patient expressed understanding and agreed to proceed.  Vital Signs: Because this visit was a virtual/telehealth visit, some criteria may be missing or patient reported. Any vitals not documented were not able to be obtained and vitals that have been documented are patient reported.   Cardiac Risk Factors include: advanced age (>7men, >49 women);male gender;dyslipidemia;hypertension;Other (see comment), Risk factor comments: CAD     Objective:    Today's Vitals   06/25/23 0813  Weight: 163 lb (73.9 kg)  Height: 5\' 8"  (1.727 m)   Body mass index is 24.78 kg/m.     06/25/2023    8:26 AM 05/19/2023   10:00 AM 06/22/2022    9:39 AM 03/17/2022   11:24 AM 12/22/2021   12:56 PM 06/20/2021    9:26 AM 05/08/2021    7:54 AM  Advanced Directives  Does Patient Have a Medical Advance Directive? No Yes No Yes Yes Yes Yes  Type of Furniture conservator/restorer;Living will  Living will Living will Healthcare Power of [place];Living will Healthcare Power of [place];Living will  Does patient want to make changes to medical advance directive?      No - Patient declined   Copy of Healthcare Power of Attorney in Chart?  No - copy requested    No - copy requested   Would patient like information on creating a medical advance directive? No - Patient declined  No - Patient declined No - Patient declined No - Patient declined      Current Medications (verified) Outpatient Encounter Medications as of 06/25/2023  Medication Sig   apixaban  (ELIQUIS) 5 MG TABS tablet Take 1 tablet (5 mg total) by mouth every 12 (twelve) hours.   aspirin EC 81 MG tablet Take 81 mg by mouth daily. Swallow whole.   busPIRone (BUSPAR) 5 MG tablet Take 1 tablet (5 mg total) by mouth daily as needed.   lisinopril (ZESTRIL) 40 MG tablet Take 1 tablet (40 mg total) by mouth daily.   Multiple Vitamin (MULTIVITAMIN) capsule Take by mouth.   nitroGLYCERIN (NITROSTAT) 0.4 MG SL tablet Place 0.4 mg under the tongue every 5 (five) minutes as needed for chest pain.   omeprazole (PRILOSEC) 20 MG capsule Take 1 capsule (20 mg total) by mouth 2 (two) times daily.   pravastatin (PRAVACHOL) 40 MG tablet Take 1 tablet by mouth daily.   REPATHA SURECLICK 140 MG/ML SOAJ INJECT 140 MG INTO THE SKIN EVERY 14 DAYS   No facility-administered encounter medications on file as of 06/25/2023.    Allergies (verified) Atorvastatin   History: Past Medical History:  Diagnosis Date   Basal cell carcinoma (BCC) of back    Basal cell carcinoma (BCC) of nasal sidewall    Dizziness    GERD (gastroesophageal reflux disease)    H/O blood clots    Headache    migraines   Hypercholesterolemia    Hypertension    hx of HBP readings   Metastatic squamous cell carcinoma 2018   bx cervical lymph node   Seizures (HCC) 2021   Tonsillar cancer (HCC)  Vascular abnormality    VTE (venous thromboembolism) 2018   associated with chemotherapy   Past Surgical History:  Procedure Laterality Date   COLONOSCOPY     COLONOSCOPY N/A 05/08/2021   Procedure: COLONOSCOPY;  Surgeon: Jaynie Collins, DO;  Location: [facility] ENDOSCOPY;  Service: Gastroenterology;  Laterality: N/A;   endovascular balloon angioplasty femoral/popliteal artery w/stent Left 11/08/2017   ESOPHAGOGASTRODUODENOSCOPY N/A 05/08/2021   Procedure: ESOPHAGOGASTRODUODENOSCOPY (EGD);  Surgeon: Jaynie Collins, DO;  Location[facility] ENDOSCOPY;  Service: Gastroenterology;  Laterality: N/A;   ESOPHAGOGASTRODUODENOSCOPY  (EGD) WITH PROPOFOL N/A 05/24/2015   Procedure: ESOPHAGOGASTRODUODENOSCOPY (EGD) WITH PROPOFOL;  Surgeon: Scot Jun, MD;  Location: [facility] ENDOSCOPY;  Service: Endoscopy;  Laterality: N/A;   FEMORAL-FEMORAL BYPASS GRAFT     IR IMAGING GUIDED PORT INSERTION  12/09/2016   LEFT HEART CATH AND CORONARY ANGIOGRAPHY N/A 12/25/2019   Procedure: LEFT HEART CATH AND CORONARY ANGIOGRAPHY;  Surgeon: Lamar Blinks, MD;  Location: [facility] INVASIVE CV LAB;  Service: Cardiovascular;  Laterality: N/A;   SAVORY DILATION N/A 05/24/2015   Procedure: SAVORY DILATION;  Surgeon: Scot Jun, MD;  Location: [facility] ENDOSCOPY;  Service: Endoscopy;  Laterality: N/A;   skin cancer removed from face   2024   Family History  Problem Relation Age of Onset   Hyperlipidemia Mother    Heart disease Mother    Hypertension Mother    Alzheimer's disease Mother    Prostate cancer Father    Stroke Father    Social History   Socioeconomic History   Marital status: Married    Spouse name: Not on file   Number of children: Not on file   Years of education: Not on file   Highest education level: Not on file  Occupational History   Occupation: Curator  Tobacco Use   Smoking status: Former    Current packs/day: 0.00    Types: Cigarettes    Quit date: 11/11/1996    Years since quitting: 26.6   Smokeless tobacco: Never  Vaping Use   Vaping status: Never Used  Substance and Sexual Activity   Alcohol use: Yes    Comment: occasional   Drug use: No   Sexual activity: Yes  Other Topics Concern   Not on file  Social History Narrative   Married   Social Determinants of Health   Financial Resource Strain: Low Risk  (06/25/2023)   Overall Financial Resource Strain (CARDIA)    Difficulty of Paying Living Expenses: Not hard at all  Food Insecurity: No Food Insecurity (06/25/2023)   Hunger Vital Sign    Worried About Running Out of Food in the Last Year: Never true    Ran Out of Food in the Last Year: Never  true  Transportation Needs: No Transportation Needs (06/25/2023)   PRAPARE - Administrator, Civil Service (Medical): No    Lack of Transportation (Non-Medical): No  Physical Activity: Insufficiently Active (06/25/2023)   Exercise Vital Sign    Days of Exercise per Week: 3 days    Minutes of Exercise per Session: 30 min  Stress: No Stress Concern Present (06/25/2023)   [employer] of Occupational Health - Occupational Stress Questionnaire    Feeling of Stress : Only a little  Social Connections: Moderately Isolated (06/25/2023)   Social Connection and Isolation Panel [NHANES]    Frequency of Communication with Friends and Family: More than three times a week    Frequency of Social Gatherings with Friends and Family: Three times a week  Attends Religious Services: Never    Active Member of Clubs or Organizations: No    Attends Banker Meetings: Never    Marital Status: Married    Tobacco Counseling Counseling given: Not Answered   Clinical Intake:  Pre-visit preparation completed: Yes  Pain : No/denies pain     BMI - recorded: 24.78 Nutritional Status: BMI of 19-24  Normal Nutritional Risks: None Diabetes: No  How often do you need to have someone help you when you read instructions, pamphlets, or other written materials from your doctor or pharmacy?: 1 - Never  Interpreter Needed?: No  Information entered by :: R. Tessah Patchen LPN   Activities of Daily Living    06/25/2023    8:15 AM  In your present state of health, do you have any difficulty performing the following activities:  Hearing? 0  Vision? 0  Comment readers  Difficulty concentrating or making decisions? 0  Walking or climbing stairs? 0  Dressing or bathing? 0  Doing errands, shopping? 0  Preparing Food and eating ? N  Using the Toilet? N  In the past six months, have you accidently leaked urine? N  Do you have problems with loss of bowel control? N  Managing your  Medications? N  Managing your Finances? N  Housekeeping or managing your Housekeeping? N    Patient Care Team: Dale Mora, MD as PCP - General (Internal Medicine)  Indicate any recent Medical Services you may have received from other than [facility] providers in the past year (date may be approximate).     Assessment:   This is a routine wellness examination for [place].  Hearing/Vision screen Hearing Screening - Comments:: No issues Vision Screening - Comments:: readers   Goals Addressed             This Visit's Progress    Patient Stated       Wants to continue to work and stay mobile       Depression Screen    06/25/2023    8:21 AM 05/25/2023   10:04 AM 06/22/2022    9:38 AM 03/04/2022    8:01 AM 06/20/2021    9:21 AM 05/08/2020    9:10 AM 04/25/2019    8:16 AM  PHQ 2/9 Scores  PHQ - 2 Score 0 0 0 0 0 0 0  PHQ- 9 Score 0 2         Fall Risk    06/25/2023    8:17 AM 05/06/2023    1:35 PM 06/22/2022    9:41 AM 03/04/2022    8:01 AM 06/20/2021    9:21 AM  Fall Risk   Falls in the past year? 0 0 0 0 0  Number falls in past yr: 0 0 0 0 0  Injury with Fall? 0 0 0 0   Risk for fall due to : No Fall Risks  No Fall Risks No Fall Risks   Follow up Falls prevention discussed;Falls evaluation completed  Falls evaluation completed Falls evaluation completed Falls evaluation completed    MEDICARE RISK AT HOME: Medicare Risk at Home Any stairs in or around the home?: Yes If so, are there any without handrails?: No Home free of loose throw rugs in walkways, pet beds, electrical cords, etc?: Yes Adequate lighting in your home to reduce risk of falls?: Yes Life alert?: No Use of a cane, walker or w/c?: No Grab bars in the bathroom?: No Shower chair or bench in shower?: Yes Elevated toilet seat  or a handicapped toilet?: Yes  Cognitive Function:        06/25/2023    8:27 AM 06/22/2022    9:43 AM 06/20/2021    9:11 AM 05/08/2020    9:21 AM  6CIT Screen  What Year? 0  points 0 points 0 points 0 points  What month? 0 points 0 points 0 points 0 points  What time? 0 points 0 points 0 points   Count back from 20 0 points 0 points 0 points 0 points  Months in reverse 0 points 0 points 0 points 0 points  Repeat phrase 0 points 0 points 0 points 0 points  Total Score 0 points 0 points 0 points     Immunizations Immunization History  Administered Date(s) Administered   Influenza-Unspecified 06/17/2018   PFIZER(Purple Top)SARS-COV-2 Vaccination 04/23/2020, 05/14/2020    TDAP status: Due, Education has been provided regarding the importance of this vaccine. Advised may receive this vaccine at local pharmacy or Health Dept. Aware to provide a copy of the vaccination record if obtained from local pharmacy or Health Dept. Verbalized acceptance and understanding.  Flu Vaccine status: Due, Education has been provided regarding the importance of this vaccine. Advised may receive this vaccine at local pharmacy or Health Dept. Aware to provide a copy of the vaccination record if obtained from local pharmacy or Health Dept. Verbalized acceptance and understanding. Covid-19 vaccine status: Information provided on how to obtain vaccines.   Qualifies for Shingles Vaccine? Yes   Zostavax completed No   Shingrix Completed?: No.    Education has been provided regarding the importance of this vaccine. Patient has been advised to call insurance company to determine out of pocket expense if they have not yet received this vaccine. Advised may also receive vaccine at local pharmacy or Health Dept. Verbalized acceptance and understanding.  Screening Tests Health Maintenance  Topic Date Due   COVID-19 Vaccine (3 - Pfizer risk series) 06/11/2020   Medicare Annual Wellness (AWV)  06/23/2023   Zoster Vaccines- Shingrix (1 of 2) 08/19/2023 (Originally 07/02/1980)   INFLUENZA VACCINE  11/15/2023 (Originally 03/18/2023)   Colonoscopy  05/09/2031   Hepatitis C Screening  Completed    HIV Screening  Completed   HPV VACCINES  Aged Out   DTaP/Tdap/Td  Discontinued    Health Maintenance  Health Maintenance Due  Topic Date Due   COVID-19 Vaccine (3 - Pfizer risk series) 06/11/2020   Medicare Annual Wellness (AWV)  06/23/2023    Colorectal cancer screening: Type of screening: Colonoscopy. Completed 04/2021. Repeat every 10 years  Lung Cancer Screening: (Low Dose CT Chest recommended if Age 64-80 years, 20 pack-year currently smoking OR have quit w/in 15years.) does not qualify.     Additional Screening:  Hepatitis C Screening: does qualify; Completed 04/2020  Vision Screening: Recommended annual ophthalmology exams for early detection of glaucoma and other disorders of the eye. Is the patient up to date with their annual eye exam?  Yes  Who is the provider or what is the name of the office in which the patient attends annual eye exams? Dr. Alvester Morin If pt is not established with a provider, would they like to be referred to a provider to establish care? No .   Dental Screening: Recommended annual dental exams for proper oral hygiene    Community Resource Referral / Chronic Care Management: CRR required this visit?  No   CCM required this visit?  No     Plan:     I have  personally reviewed and noted the following in the patient's chart:   Medical and social history Use of alcohol, tobacco or illicit drugs  Current medications and supplements including opioid prescriptions. Patient is not currently taking opioid prescriptions. Functional ability and status Nutritional status Physical activity Advanced directives List of other physicians Hospitalizations, surgeries, and ER visits in previous 12 months Vitals Screenings to include cognitive, depression, and falls Referrals and appointments  In addition, I have reviewed and discussed with patient certain preventive protocols, quality metrics, and best practice recommendations. A written personalized care  plan for preventive services as well as general preventive health recommendations were provided to patient.     Sydell Axon, LPN   [id number]   After Visit Summary: (MyChart) Due to this being a telephonic visit, the after visit summary with patients personalized plan was offered to patient via MyChart   Nurse Notes: None

## 2023-06-25 NOTE — Patient Instructions (Signed)
Mr. Puri , Thank you for taking time to come for your Medicare Wellness Visit. I appreciate your ongoing commitment to your health goals. Please review the following plan we discussed and let me know if I can assist you in the future.   Referrals/Orders/Follow-Ups/Clinician Recommendations: Consider updating your vaccines  This is a list of the screening recommended for you and due dates:  Health Maintenance  Topic Date Due   COVID-19 Vaccine (3 - Pfizer risk series) 06/11/2020   Zoster (Shingles) Vaccine (1 of 2) 08/19/2023*   Flu Shot  11/15/2023*   Medicare Annual Wellness Visit  06/24/2024   Colon Cancer Screening  05/09/2031   Hepatitis C Screening  Completed   HIV Screening  Completed   HPV Vaccine  Aged Out   DTaP/Tdap/Td vaccine  Discontinued  *Topic was postponed. The date shown is not the original due date.    Advanced directives: (Declined) Advance directive discussed with you today. Even though you declined this today, please call our office should you change your mind, and we can give you the proper paperwork for you to fill out.  Next Medicare Annual Wellness Visit scheduled for next year: Yes 06/28/24 @ 8:10

## 2023-06-29 DIAGNOSIS — Z5189 Encounter for other specified aftercare: Secondary | ICD-10-CM | POA: Diagnosis not present

## 2023-06-29 DIAGNOSIS — I2089 Other forms of angina pectoris: Secondary | ICD-10-CM | POA: Diagnosis not present

## 2023-06-29 NOTE — Progress Notes (Signed)
Daily Session Note  Patient Details  Name: Timothy Garrison MRN: [id number] Date of Birth: June 08, 1961 Referring Provider:   Flowsheet Row Cardiac Rehab from 05/20/2023 in [facility] Cardiac and Pulmonary Rehab  Referring Provider Vevelyn Francois, MD       Encounter Date: 06/29/2023  Check In:  Session Check In - 06/29/23 0929       Check-In   Supervising physician immediately available to respond to emergencies See telemetry face sheet for immediately available ER MD    Location [facility]-Cardiac & Pulmonary Rehab    Staff Present Maxon Conetta BS, , Exercise Physiologist;Margaret Best, MS, Exercise Physiologist;Davontae Prusinski, RN, BSN;Noah Tickle, BS, Exercise Physiologist    Virtual Visit No    Medication changes reported     No    Fall or balance concerns reported    No    Warm-up and Cool-down Performed on first and last piece of equipment    Resistance Training Performed Yes    VAD Patient? No    PAD/SET Patient? No      Pain Assessment   Currently in Pain? No/denies                Social History   Tobacco Use  Smoking Status Former   Current packs/day: 0.00   Types: Cigarettes   Quit date: 11/11/1996   Years since quitting: 26.6  Smokeless Tobacco Never    Goals Met:  Independence with exercise equipment Exercise tolerated well No report of concerns or symptoms today Strength training completed today  Goals Unmet:  Not Applicable  Comments: Pt able to follow exercise prescription today without complaint.  Will continue to monitor for progression.   Dr. Bethann Punches is Medical Director for [facility] Cardiac Rehabilitation.  Dr. Vida Rigger is Medical Director for [facility] Pulmonary Rehabilitation.

## 2023-07-02 DIAGNOSIS — M5412 Radiculopathy, cervical region: Secondary | ICD-10-CM | POA: Diagnosis not present

## 2023-07-02 DIAGNOSIS — M4802 Spinal stenosis, cervical region: Secondary | ICD-10-CM | POA: Diagnosis not present

## 2023-07-06 ENCOUNTER — Encounter: Payer: Medicare Other | Admitting: *Deleted

## 2023-07-06 DIAGNOSIS — I2089 Other forms of angina pectoris: Secondary | ICD-10-CM

## 2023-07-06 DIAGNOSIS — Z5189 Encounter for other specified aftercare: Secondary | ICD-10-CM | POA: Diagnosis not present

## 2023-07-06 NOTE — Progress Notes (Signed)
Daily Session Note  Patient Details  Name: Timothy Garrison MRN: [id number] Date of Birth: 18-Jan-1961 Referring Provider:   Flowsheet Row Cardiac Rehab from 05/20/2023 in [facility] Cardiac and Pulmonary Rehab  Referring Provider Vevelyn Francois, MD       Encounter Date: 07/06/2023  Check In:  Session Check In - 07/06/23 0943       Check-In   Supervising physician immediately available to respond to emergencies See telemetry face sheet for immediately available ER MD    Location [facility]-Cardiac & Pulmonary Rehab    Staff Present Cora Collum, RN, BSN, CCRP;Noah Tickle, BS, Exercise Physiologist;Margaret Best, MS, Exercise Physiologist;Maxon Conetta BS, , Exercise Physiologist    Virtual Visit No    Medication changes reported     No    Fall or balance concerns reported    No    Warm-up and Cool-down Performed on first and last piece of equipment    Resistance Training Performed Yes    VAD Patient? No    PAD/SET Patient? No      Pain Assessment   Currently in Pain? No/denies                Social History   Tobacco Use  Smoking Status Former   Current packs/day: 0.00   Types: Cigarettes   Quit date: 11/11/1996   Years since quitting: 26.6  Smokeless Tobacco Never    Goals Met:  Independence with exercise equipment Exercise tolerated well No report of concerns or symptoms today  Goals Unmet:  Not Applicable  Comments: Pt able to follow exercise prescription today without complaint.  Will continue to monitor for progression.    Dr. Bethann Punches is Medical Director for [facility] Cardiac Rehabilitation.  Dr. Vida Rigger is Medical Director for [facility] Pulmonary Rehabilitation.

## 2023-07-07 ENCOUNTER — Encounter: Payer: Self-pay | Admitting: *Deleted

## 2023-07-07 DIAGNOSIS — I10 Essential (primary) hypertension: Secondary | ICD-10-CM | POA: Diagnosis not present

## 2023-07-07 DIAGNOSIS — Z789 Other specified health status: Secondary | ICD-10-CM | POA: Diagnosis not present

## 2023-07-07 DIAGNOSIS — I2089 Other forms of angina pectoris: Secondary | ICD-10-CM

## 2023-07-07 DIAGNOSIS — E785 Hyperlipidemia, unspecified: Secondary | ICD-10-CM | POA: Diagnosis not present

## 2023-07-07 DIAGNOSIS — R9439 Abnormal result of other cardiovascular function study: Secondary | ICD-10-CM | POA: Diagnosis not present

## 2023-07-07 DIAGNOSIS — I25110 Atherosclerotic heart disease of native coronary artery with unstable angina pectoris: Secondary | ICD-10-CM | POA: Diagnosis not present

## 2023-07-07 DIAGNOSIS — I739 Peripheral vascular disease, unspecified: Secondary | ICD-10-CM | POA: Diagnosis not present

## 2023-07-07 NOTE — Progress Notes (Signed)
Cardiac Individual Treatment Plan  Patient Details  Name: Timothy Garrison MRN: [id number] Date of Birth: 1960-10-30 Referring Provider:   Flowsheet Row Cardiac Rehab from 05/20/2023 in [facility] Cardiac and Pulmonary Rehab  Referring Provider Vevelyn Francois, MD       Initial Encounter Date:  Flowsheet Row Cardiac Rehab from 05/20/2023 in [facility] Cardiac and Pulmonary Rehab  Date 05/20/23       Visit Diagnosis: Chronic stable angina (HCC)  Patient's Home Medications on Admission:  Current Outpatient Medications:    apixaban (ELIQUIS) 5 MG TABS tablet, Take 1 tablet (5 mg total) by mouth every 12 (twelve) hours., Disp: 180 tablet, Rfl: 1   aspirin EC 81 MG tablet, Take 81 mg by mouth daily. Swallow whole., Disp: , Rfl:    busPIRone (BUSPAR) 5 MG tablet, Take 1 tablet (5 mg total) by mouth daily as needed., Disp: 30 tablet, Rfl: 1   lisinopril (ZESTRIL) 40 MG tablet, Take 1 tablet (40 mg total) by mouth daily., Disp: 90 tablet, Rfl: 3   Multiple Vitamin (MULTIVITAMIN) capsule, Take by mouth., Disp: , Rfl:    nitroGLYCERIN (NITROSTAT) 0.4 MG SL tablet, Place 0.4 mg under the tongue every 5 (five) minutes as needed for chest pain., Disp: , Rfl:    omeprazole (PRILOSEC) 20 MG capsule, Take 1 capsule (20 mg total) by mouth 2 (two) times daily., Disp: 180 capsule, Rfl: 3   pravastatin (PRAVACHOL) 40 MG tablet, Take 1 tablet by mouth daily., Disp: , Rfl:    REPATHA SURECLICK 140 MG/ML SOAJ, INJECT 140 MG INTO THE SKIN EVERY 14 DAYS, Disp: 2 mL, Rfl: 2  Past Medical History: Past Medical History:  Diagnosis Date   Basal cell carcinoma (BCC) of back    Basal cell carcinoma (BCC) of nasal sidewall    Dizziness    GERD (gastroesophageal reflux disease)    H/O blood clots    Headache    migraines   Hypercholesterolemia    Hypertension    hx of HBP readings   Metastatic squamous cell carcinoma 2018   bx cervical lymph node   Seizures (HCC) 2021   Tonsillar cancer (HCC)    Vascular  abnormality    VTE (venous thromboembolism) 2018   associated with chemotherapy    Tobacco Use: Social History   Tobacco Use  Smoking Status Former   Current packs/day: 0.00   Types: Cigarettes   Quit date: 11/11/1996   Years since quitting: 26.6  Smokeless Tobacco Never    Labs: Review Flowsheet  More data exists      Latest Ref Rng & Units 10/30/2021 03/04/2022 06/05/2022 09/08/2022 01/07/2023  Labs for ITP Cardiac and Pulmonary Rehab  Cholestrol 0 - 200 mg/dL 956  213  086  578  469   LDL (calc) 0 - 99 mg/dL 629  528  413  - 32   Direct LDL mg/dL - - - 244.0  -  HDL-C >10.27 mg/dL 25.36  64.40  34.74  25.95  44.30   Trlycerides 0.0 - 149.0 mg/dL 638.7  564.3  329.5  188.4  121.0     Details             Exercise Target Goals: Exercise Program Goal: Individual exercise prescription set using results from initial 6 min walk test and THRR while considering  patient's activity barriers and safety.   Exercise Prescription Goal: Initial exercise prescription builds to 30-45 minutes a day of aerobic activity, 2-3 days per week.  Home exercise guidelines will  be given to patient during program as part of exercise prescription that the participant will acknowledge.   Education: Aerobic Exercise: - Group verbal and visual presentation on the components of exercise prescription. Introduces F.I.T.T principle from ACSM for exercise prescriptions.  Reviews F.I.T.T. principles of aerobic exercise including progression. Written material given at graduation. Flowsheet Row Cardiac Rehab from 05/27/2023 in [facility] Cardiac and Pulmonary Rehab  Education need identified 05/20/23       Education: Resistance Exercise: - Group verbal and visual presentation on the components of exercise prescription. Introduces F.I.T.T principle from ACSM for exercise prescriptions  Reviews F.I.T.T. principles of resistance exercise including progression. Written material given at graduation.    Education:  Exercise & Equipment Safety: - Individual verbal instruction and demonstration of equipment use and safety with use of the equipment. Flowsheet Row Cardiac Rehab from 05/27/2023 in [facility] Cardiac and Pulmonary Rehab  Date 05/20/23  Educator MB  Instruction Review Code 1- Verbalizes Understanding       Education: Exercise Physiology & General Exercise Guidelines: - Group verbal and written instruction with models to review the exercise physiology of the cardiovascular system and associated critical values. Provides general exercise guidelines with specific guidelines to those with heart or lung disease.    Education: Flexibility, Balance, Mind/Body Relaxation: - Group verbal and visual presentation with interactive activity on the components of exercise prescription. Introduces F.I.T.T principle from ACSM for exercise prescriptions. Reviews F.I.T.T. principles of flexibility and balance exercise training including progression. Also discusses the mind body connection.  Reviews various relaxation techniques to help reduce and manage stress (i.e. Deep breathing, progressive muscle relaxation, and visualization). Balance handout provided to take home. Written material given at graduation.   Activity Barriers & Risk Stratification:  Activity Barriers & Cardiac Risk Stratification - 05/20/23 1453       Activity Barriers & Cardiac Risk Stratification   Activity Barriers Other (comment);Neck/Spine Problems    Comments leg pain with walking    Cardiac Risk Stratification High             6 Minute Walk:  6 Minute Walk     Row Name 05/20/23 1452         6 Minute Walk   Phase Initial     Distance 1330 feet     Walk Time 6 minutes     # of Rest Breaks 0     MPH 2.52     METS 3.9     RPE 12     Perceived Dyspnea  0     VO2 Peak 13.64     Symptoms Yes (comment)     Comments 4/10 Leg pain     Resting HR 85 bpm     Resting BP 146/86     Resting Oxygen Saturation  97 %     Exercise  Oxygen Saturation  during 6 min walk 99 %     Max Ex. HR 107 bpm     Max Ex. BP 170/92     2 Minute Post BP 158/92              Oxygen Initial Assessment:   Oxygen Re-Evaluation:   Oxygen Discharge (Final Oxygen Re-Evaluation):   Initial Exercise Prescription:  Initial Exercise Prescription - 05/20/23 1400       Date of Initial Exercise RX and Referring Provider   Date 05/20/23    Referring Provider Vevelyn Francois, MD      Oxygen   Maintain Oxygen Saturation 88% or  higher      Treadmill   MPH 2.5    Grade 0    Minutes 15    METs 2.91      Recumbant Bike   Level 3    RPM 50    Watts 45    Minutes 15    METs 3.9      NuStep   Level 3    SPM 80    Minutes 15    METs 3.9      Elliptical   Level 2    Speed 3    Minutes 15    METs 3.9      Prescription Details   Frequency (times per week) 2    Duration Progress to 30 minutes of continuous aerobic without signs/symptoms of physical distress      Intensity   THRR 40-80% of Max Heartrate 114-143    Ratings of Perceived Exertion 11-13    Perceived Dyspnea 0-4      Progression   Progression Continue to progress workloads to maintain intensity without signs/symptoms of physical distress.      Resistance Training   Training Prescription Yes    Weight 5 lb    Reps 10-15             Perform Capillary Blood Glucose checks as needed.  Exercise Prescription Changes:   Exercise Prescription Changes     Row Name 05/20/23 1400 06/04/23 0800 06/16/23 1100         Response to Exercise   Blood Pressure (Admit) 146/86 122/70 136/70     Blood Pressure (Exercise) 170/92 164/86 158/82     Blood Pressure (Exit) 146/82 124/78 104/62     Heart Rate (Admit) 85 bpm 93 bpm 92 bpm     Heart Rate (Exercise) 107 bpm 132 bpm 145 bpm     Heart Rate (Exit) 72 bpm 111 bpm 109 bpm     Oxygen Saturation (Admit) 97 % -- --     Oxygen Saturation (Exercise) 99 % -- --     Oxygen Saturation (Exit) 99 % -- --      Rating of Perceived Exertion (Exercise) 12 13 14      Perceived Dyspnea (Exercise) 0 -- --     Symptoms 4/10 leg pain none none     Comments results First two days of exercise --     Duration Progress to 30 minutes of  aerobic without signs/symptoms of physical distress Progress to 30 minutes of  aerobic without signs/symptoms of physical distress Continue with 30 min of aerobic exercise without signs/symptoms of physical distress.     Intensity THRR New THRR unchanged THRR unchanged       Progression   Progression Continue to progress workloads to maintain intensity without signs/symptoms of physical distress. Continue to progress workloads to maintain intensity without signs/symptoms of physical distress. Continue to progress workloads to maintain intensity without signs/symptoms of physical distress.     Average METs 3.9 3.05 3.24       Resistance Training   Training Prescription -- Yes Yes     Weight -- 5 lb 5 lb     Reps -- 10-15 10-15       Interval Training   Interval Training -- No No       Treadmill   MPH -- 2.2 2.6     Grade -- 0 0     Minutes -- 30 15     METs -- 2.69 2.99  Recumbant Bike   Level -- 5 7     Watts -- 37 50     Minutes -- 15 15     METs -- 3.57 4.12       NuStep   Level -- 3 4  T6: 3     Minutes -- 15 15     METs -- 3.1 3.5  T6: 2.8       Oxygen   Maintain Oxygen Saturation -- 88% or higher 88% or higher              Exercise Comments:   Exercise Comments     Row Name 05/25/23 0953           Exercise Comments First full day of exercise!  Patient was oriented to gym and equipment including functions, settings, policies, and procedures.  Patient's individual exercise prescription and treatment plan were reviewed.  All starting workloads were established based on the results of the 6 minute walk test done at initial orientation visit.  The plan for exercise progression was also introduced and progression will be customized based on  patient's performance and goals.                Exercise Goals and Review:   Exercise Goals     Row Name 05/20/23 1456             Exercise Goals   Increase Physical Activity Yes       Intervention Provide advice, education, support and counseling about physical activity/exercise needs.;Develop an individualized exercise prescription for aerobic and resistive training based on initial evaluation findings, risk stratification, comorbidities and participant's personal goals.       Expected Outcomes Short Term: Attend rehab on a regular basis to increase amount of physical activity.;Long Term: Exercising regularly at least 3-5 days a week.;Long Term: Add in home exercise to make exercise part of routine and to increase amount of physical activity.       Increase Strength and Stamina Yes       Intervention Provide advice, education, support and counseling about physical activity/exercise needs.;Develop an individualized exercise prescription for aerobic and resistive training based on initial evaluation findings, risk stratification, comorbidities and participant's personal goals.       Expected Outcomes Short Term: Increase workloads from initial exercise prescription for resistance, speed, and METs.;Short Term: Perform resistance training exercises routinely during rehab and add in resistance training at home;Long Term: Improve cardiorespiratory fitness, muscular endurance and strength as measured by increased METs and functional capacity ( )       Able to understand and use rate of perceived exertion (RPE) scale Yes       Intervention Provide education and explanation on how to use RPE scale       Expected Outcomes Short Term: Able to use RPE daily in rehab to express subjective intensity level;Long Term:  Able to use RPE to guide intensity level when exercising independently       Able to understand and use Dyspnea scale Yes       Intervention Provide education and explanation on how  to use Dyspnea scale       Expected Outcomes Short Term: Able to use Dyspnea scale daily in rehab to express subjective sense of shortness of breath during exertion;Long Term: Able to use Dyspnea scale to guide intensity level when exercising independently       Knowledge and understanding of Target Heart Rate Range (THRR) Yes  Intervention Provide education and explanation of THRR including how the numbers were predicted and where they are located for reference       Expected Outcomes Short Term: Able to state/look up THRR;Long Term: Able to use THRR to govern intensity when exercising independently;Short Term: Able to use daily as guideline for intensity in rehab       Able to check pulse independently Yes       Intervention Provide education and demonstration on how to check pulse in carotid and radial arteries.;Review the importance of being able to check your own pulse for safety during independent exercise       Expected Outcomes Short Term: Able to explain why pulse checking is important during independent exercise;Long Term: Able to check pulse independently and accurately       Understanding of Exercise Prescription Yes       Intervention Provide education, explanation, and written materials on patient's individual exercise prescription       Expected Outcomes Short Term: Able to explain program exercise prescription;Long Term: Able to explain home exercise prescription to exercise independently                Exercise Goals Re-Evaluation :  Exercise Goals Re-Evaluation     Row Name 05/25/23 0953 06/04/23 0803 06/16/23 1137         Exercise Goal Re-Evaluation   Exercise Goals Review Able to understand and use rate of perceived exertion (RPE) scale;Knowledge and understanding of Target Heart Rate Range (THRR);Able to understand and use Dyspnea scale;Understanding of Exercise Prescription Increase Physical Activity;Understanding of Exercise Prescription;Increase Strength and  Stamina Increase Physical Activity;Understanding of Exercise Prescription;Increase Strength and Stamina     Comments Reviewed RPE and dyspnea scale, THR and program prescription with pt today.  Pt voiced understanding and was given a copy of goals to take home. Timothy Garrison is off to a good start in the program. He was able to walk the treadmill at a speed of 2.2 mph with no incline. He also worked at level 3 on the T4 nustep and level 5 on the recumbent bike. He was able to tolerate 5 lb hand weights for resistance training as well. We will continue to monitor his progress in the program. Timothy Garrison is doing well in the program. He recently increased his workload on the treadmill by increasing his speed to 2.6 mph with no incline. He also improved to level 4 on the T4 nustep and level 7 on the recumbent bike. He continues to do well with 5 lb hand weights for resistance training as well. We will continue to monitor his progress in the program.     Expected Outcomes Short: Use RPE daily to regulate intensity. Long: Follow program prescription in THR. Short: Continue to follow initial exercise prescription. Long: Continue exercise to improve strength and stamina. Short: Continue to progressively increase treadmill workload by adding incline. Long: Continue exercise to improve strength and stamina.              Discharge Exercise Prescription (Final Exercise Prescription Changes):  Exercise Prescription Changes - 06/16/23 1100       Response to Exercise   Blood Pressure (Admit) 136/70    Blood Pressure (Exercise) 158/82    Blood Pressure (Exit) 104/62    Heart Rate (Admit) 92 bpm    Heart Rate (Exercise) 145 bpm    Heart Rate (Exit) 109 bpm    Rating of Perceived Exertion (Exercise) 14    Symptoms none  Duration Continue with 30 min of aerobic exercise without signs/symptoms of physical distress.    Intensity THRR unchanged      Progression   Progression Continue to progress workloads to maintain  intensity without signs/symptoms of physical distress.    Average METs 3.24      Resistance Training   Training Prescription Yes    Weight 5 lb    Reps 10-15      Interval Training   Interval Training No      Treadmill   MPH 2.6    Grade 0    Minutes 15    METs 2.99      Recumbant Bike   Level 7    Watts 50    Minutes 15    METs 4.12      NuStep   Level 4   T6: 3   Minutes 15    METs 3.5   T6: 2.8     Oxygen   Maintain Oxygen Saturation 88% or higher             Nutrition:  Target Goals: Understanding of nutrition guidelines, daily intake of sodium 1500mg , cholesterol 200mg , calories 30% from fat and 7% or less from saturated fats, daily to have 5 or more servings of fruits and vegetables.  Education: All About Nutrition: -Group instruction provided by verbal, written material, interactive activities, discussions, models, and posters to present general guidelines for heart healthy nutrition including fat, fiber, MyPlate, the role of sodium in heart healthy nutrition, utilization of the nutrition label, and utilization of this knowledge for meal planning. Follow up email sent as well. Written material given at graduation. Flowsheet Row Cardiac Rehab from 05/27/2023 in [facility] Cardiac and Pulmonary Rehab  Education need identified 05/20/23       Biometrics:  Pre Biometrics - 05/20/23 1457       Pre Biometrics   Height 5' 7.5" (1.715 m)    Weight 164 lb 6.4 oz (74.6 kg)    Waist Circumference 36.6 inches    Hip Circumference 38 inches    Waist to Hip Ratio 0.96 %    BMI (Calculated) 25.35    Single Leg Stand 21 seconds              Nutrition Therapy Plan and Nutrition Goals:  Nutrition Therapy & Goals - 05/20/23 1206       Nutrition Therapy   Diet Cardiac, low na    Protein (specify units) 90    Fiber 30 grams    Whole Grain Foods 3 servings    Saturated Fats 15 max. grams    Fruits and Vegetables 5 servings/day    Sodium 2 grams       Personal Nutrition Goals   Nutrition Goal Choose complex carbs and pair with healthy protein    Personal Goal #2 Include colorful produce at large meals or when eating out    Personal Goal #3 Make better snack choices    Comments Patient drinking mostly water. Sweet tea a few times a week, 6pack of beer per week. He is happy with the reductions he has made before coming to Rehab in these areas. Commended him on these changes and encouraged him to continue to work on them. He reports he eats 3 meals per day, wife cooks most of his food. He reports she is health conscious and has been helping him to reduce sodium, saturated fat, and sugary foods. Reviewed mediterranean diet handout, educated on types of fats, sources,  and how to read a label. Reminded him to watch his sodium intake and choose low sodium varieties of foods he likes. He asked about iron as well. Spoke with him about using iron fortified foods like cereal, using Vitamin C to help absorption and avoiding competing nutrients like calcium at the same times. Built out several meals and snacks with foods he likes with focus on including more colorful produce and complex carbs paired with healthy protein.      Intervention Plan   Intervention Prescribe, educate and counsel regarding individualized specific dietary modifications aiming towards targeted core components such as weight, hypertension, lipid management, diabetes, heart failure and other comorbidities.;Nutrition handout(s) given to patient.    Expected Outcomes Short Term Goal: Understand basic principles of dietary content, such as calories, fat, sodium, cholesterol and nutrients.;Short Term Goal: A plan has been developed with personal nutrition goals set during dietitian appointment.;Long Term Goal: Adherence to prescribed nutrition plan.             Nutrition Assessments:  MEDIFICTS Score Key: >=70 Need to make dietary changes  40-70 Heart Healthy Diet <= 40 Therapeutic Level  Cholesterol Diet  Flowsheet Row Cardiac Rehab from 05/20/2023 in [facility] Cardiac and Pulmonary Rehab  Picture Your Plate Total Score on Admission 67      Picture Your Plate Scores: <87 Unhealthy dietary pattern with much room for improvement. 41-50 Dietary pattern unlikely to meet recommendations for good health and room for improvement. 51-60 More healthful dietary pattern, with some room for improvement.  >60 Healthy dietary pattern, although there may be some specific behaviors that could be improved.    Nutrition Goals Re-Evaluation:  Nutrition Goals Re-Evaluation     Row Name 06/15/23 1043             Goals   Comment Timothy Garrison states that his wife has helped him with his diet. He is working on getting more protein primarily through chicken and has cut out red meats from his diet at this time. He is also working on drinking plenty of water throughout the day.       Expected Outcome Short: Continue to work on protein intake. Long: Continue to work on heart healthy eating patterns discussed with RD.                Nutrition Goals Discharge (Final Nutrition Goals Re-Evaluation):  Nutrition Goals Re-Evaluation - 06/15/23 1043       Goals   Comment Timothy Garrison states that his wife has helped him with his diet. He is working on getting more protein primarily through chicken and has cut out red meats from his diet at this time. He is also working on drinking plenty of water throughout the day.    Expected Outcome Short: Continue to work on protein intake. Long: Continue to work on heart healthy eating patterns discussed with RD.             Psychosocial: Target Goals: Acknowledge presence or absence of significant depression and/or stress, maximize coping skills, provide positive support system. Participant is able to verbalize types and ability to use techniques and skills needed for reducing stress and depression.   Education: Stress, Anxiety, and Depression - Group verbal and  visual presentation to define topics covered.  Reviews how body is impacted by stress, anxiety, and depression.  Also discusses healthy ways to reduce stress and to treat/manage anxiety and depression.  Written material given at graduation. Flowsheet Row Cardiac Rehab from 05/27/2023 in [facility] Cardiac  and Pulmonary Rehab  Date 05/27/23  Educator SB  Instruction Review Code 1- [employer] Understanding       Education: Sleep Hygiene -Provides group verbal and written instruction about how sleep can affect your health.  Define sleep hygiene, discuss sleep cycles and impact of sleep habits. Review good sleep hygiene tips.    Initial Review & Psychosocial Screening:  Initial Psych Review & Screening - 05/06/23 1337       Initial Review   Current issues with None Identified      Family Dynamics   Good Support System? Yes   wife, friends     Barriers   Psychosocial barriers to participate in program There are no identifiable barriers or psychosocial needs.      Screening Interventions   Interventions Encouraged to exercise;To provide support and resources with identified psychosocial needs    Expected Outcomes Short Term goal: Utilizing psychosocial counselor, staff and physician to assist with identification of specific Stressors or current issues interfering with healing process. Setting desired goal for each stressor or current issue identified.;Long Term Goal: Stressors or current issues are controlled or eliminated.;Short Term goal: Identification and review with participant of any Quality of Life or Depression concerns found by scoring the questionnaire.;Long Term goal: The participant improves quality of Life and PHQ9 Scores as seen by post scores and/or verbalization of changes             Quality of Life Scores:   Quality of Life - 05/20/23 1458       Quality of Life   Select Quality of Life      Quality of Life Scores   Health/Function Pre 22.7 %    Socioeconomic Pre  23.56 %    Psych/Spiritual Pre 24.86 %    Family Pre 27.6 %    GLOBAL Pre 24.03 %            Scores of 19 and below usually indicate a poorer quality of life in these areas.  A difference of  2-3 points is a clinically meaningful difference.  A difference of 2-3 points in the total score of the Quality of Life Index has been associated with significant improvement in overall quality of life, self-image, physical symptoms, and general health in studies assessing change in quality of life.  PHQ-9: Review Flowsheet  More data exists      06/25/2023 05/25/2023 06/22/2022 03/04/2022 06/20/2021  Depression screen PHQ 2/9  Decreased Interest 0 0 0 0 0  Down, Depressed, Hopeless 0 0 0 0 0  PHQ - 2 Score 0 0 0 0 0  Altered sleeping 0 1 - - -  Tired, decreased energy 0 1 - - -  Change in appetite 0 0 - - -  Feeling bad or failure about yourself  0 0 - - -  Trouble concentrating 0 0 - - -  Moving slowly or fidgety/restless 0 0 - - -  Suicidal thoughts 0 0 - - -  PHQ-9 Score 0 2 - - -  Difficult doing work/chores Not difficult at all Not difficult at all - - -    Details           Interpretation of Total Score  Total Score Depression Severity:  1-4 = Minimal depression, 5-9 = Mild depression, 10-14 = Moderate depression, 15-19 = Moderately severe depression, 20-27 = Severe depression   Psychosocial Evaluation and Intervention:  Psychosocial Evaluation - 05/06/23 1355       Psychosocial Evaluation &  Interventions   Interventions Encouraged to exercise with the program and follow exercise prescription    Comments Carney is coming to cardiac rehab with stable angina. He meets with his cardiologist this week to discuss options for his blockage. He currently does not struggle with chest pain but knows what medication to take if he starts. 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 His wife and friends are a great support system for him. He does have leg pain with walking which can hinder his  activites but he states that he just rests some and then gets back to what he was doing. He mentions he has a positive outlook and is ready to start the program to learn what he can/should be doing.    Expected Outcomes Short: attend cardiac rehab for education and exercise. Long: Develop and maintain positive self care habits.    Continue Psychosocial Services  Follow up required by staff             Psychosocial Re-Evaluation:  Psychosocial Re-Evaluation     Row Name 06/15/23 1039 06/24/23 1027           Psychosocial Re-Evaluation   Current issues with None Identified;Current Sleep Concerns --      Comments Timothy Garrison reports no major stressors at this time. 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 He also states that exercise has beneficial for helping him learn his limits. His wife and friends make up a good support system for him. He has not been sleeping well as he wakes up throughout the night, but he states this is his normal. Timothy Garrison states he has no concerns.  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 His wife is a great support to him.      Expected Outcomes Short: Continue to relieve stress through healthy avenues. Long: Continue to maintain positive outlook. 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 His wife is a great support to him.    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    Vocational Rehab Evaluation & Intervention:  Vocational Rehab - 05/06/23 1336       Initial Vocational Rehab Evaluation & Intervention   Assessment shows need for Vocational Rehabilitation No             Education: Education Goals: Education classes will be provided on a variety of topics geared toward better understanding of heart health and risk factor modification. Participant will state understanding/return demonstration of topics presented as noted by education test scores.  Learning Barriers/Preferences:  Learning Barriers/Preferences - 05/06/23 1336       Learning Barriers/Preferences   Learning Barriers None    Learning Preferences None             General Cardiac Education Topics:  AED/CPR: -  Group verbal and written instruction with the use of models to demonstrate the basic use of the AED with the basic ABC's of resuscitation.   Anatomy and Cardiac Procedures: - Group verbal and visual presentation and models provide information about basic cardiac anatomy and function. Reviews the testing methods done to diagnose heart disease and the outcomes of the test results. Describes the treatment choices: Medical Management, Angioplasty, or Coronary Bypass Surgery for treating various heart conditions including Myocardial Infarction, Angina, Valve Disease, and Cardiac Arrhythmias.  Written material given at graduation.   Medication Safety: - Group verbal and visual instruction to review commonly prescribed medications for heart and lung disease. Reviews the medication, class of the drug, and side effects. Includes the steps to properly store meds and maintain the prescription regimen.  Written material given at graduation.   Intimacy: - Group verbal instruction through game format to discuss how heart and lung disease can affect sexual intimacy. Written material given at graduation..   Know Your Numbers and Heart Failure: - Group verbal and visual instruction to  discuss disease risk factors for cardiac and pulmonary disease and treatment options.  Reviews associated critical values for Overweight/Obesity, Hypertension, Cholesterol, and Diabetes.  Discusses basics of heart failure: signs/symptoms and treatments.  Introduces Heart Failure Zone chart for action plan for heart failure.  Written material given at graduation.   Infection Prevention: - Provides verbal and written material to individual with discussion of infection control including proper hand washing and proper equipment cleaning during exercise session. Flowsheet Row Cardiac Rehab from 05/27/2023 in [facility] Cardiac and Pul[facility] Rehab  Date 05/20/23  Educator MB  Instruction Review Code 1- Verbalizes Understanding       Falls Prevention: - Provides verbal and written material to individual with discussion of falls prevention and safety. Flowsheet Row Cardiac Rehab from 05/27/2023 in [facility] Cardiac [facility] Pulmonary Rehab  Date 05/20/23  Educator MB  Instruction Review Code 1- Verbalizes Understanding       Other: -Provides group and verbal instruction on various topics (see comments)   Knowledge Questionnaire Score:  Knowledge Questionnaire Score - 05/20/23 1500       Knowledge Questionnaire Score   Pre Score 24/26             Core Components/Risk Factors/Patient Goals at Admission:  Personal Goals and Risk Factors at Admission - 05/20/23 1501       Core Components/Risk Factors/Patient Goals on Admission    Weight Management Yes;Weight Maintenance    Intervention Weight Management: Develop a combined nutrition and exercise program designed to reach desired caloric intake, while maintaining appropriate intake of nutrient and fiber, sodium and fats, and appropriate energy expenditure required for the weight goal.;Weight Management: Provide education and appropriate resources to help participant work on and attain dietary goals.;Weight Management/Obesity: Establish reasonable  short term and long term weight goals.    Admit Weight 164 lb 6.4 oz (74.6 kg)    Goal Weight: Short Term 164 lb 6.4 oz (74.6 kg)    Goal Weight: Long Term 164 lb 6.4 oz (74.6 kg)    Expected Outcomes Short Term: Continue to assess and modify interventions until short term weight is achieved;Long Term: Adherence to nutrition and physical activity/exercise program aimed toward attainment of established weight goal;Weight Maintenance: Understanding of the daily nutrition guidelines, which includes 25-35% calories from fat, 7% or less cal from saturated fats, less than 200mg  cholesterol, less than 1.5gm of sodium, & 5 or more servings of fruits and vegetables daily;Understanding  recommendations for meals to include 15-35% energy as protein, 25-35% energy from fat, 35-60% energy from carbohydrates, less than 200mg  of dietary cholesterol, 20-35 gm of total fiber daily;Understanding of distribution of calorie intake throughout the day with the consumption of 4-5 meals/snacks    Hypertension Yes    Intervention Provide education on lifestyle modifcations including regular physical activity/exercise, weight management, moderate sodium restriction and increased consumption of fresh fruit, vegetables, and low fat dairy, alcohol moderation, and smoking cessation.;Monitor prescription use compliance.    Expected Outcomes Short Term: Continued assessment and intervention until BP is < 140/58mm HG in hypertensive participants. < 130/46mm HG in hypertensive participants with diabetes, heart failure or chronic kidney disease.;Long Term: Maintenance of blood pressure at goal levels.    Lipids Yes    Intervention Provide education and support for participant on nutrition & aerobic/resistive exercise along with prescribed medications to achieve LDL 70mg , HDL >40mg .    Expected Outcomes Short Term: Participant states understanding of desired cholesterol values and is compliant with medications prescribed. Participant is  following exercise prescription and nutrition guidelines.;Long Term: Cholesterol controlled with medications as prescribed, with individualized exercise RX and with personalized nutrition plan. Value goals: LDL < 70mg , HDL > 40 mg.             Education:Diabetes - Individual verbal and written instruction to review signs/symptoms of diabetes, desired ranges of glucose level fasting, after meals and with exercise. Acknowledge that pre and post exercise glucose checks will be done for 3 sessions at entry of program.   Core Components/Risk Factors/Patient Goals Review:   Goals and Risk Factor Review     Row Name 06/15/23 1045 06/24/23 1325           Core Components/Risk Factors/Patient Goals Review   Personal Goals Review Weight Management/Obesity;Hypertension Weight Management/Obesity;Hypertension;Lipids      Review Timothy Garrison is doing well and would like to continue to maintain his weight between 160-165 lbs. He also owns a BP cuff and uses it to check his BP about once or twice a week. He reports that his BP has stayed within normal ranges. He continues to take all of his medications as prescribed. Timothy Garrison continues to work on maintaining his weight around 106-165 lbs. His wife is working to help him eat healthy. More fruit, no red meat.  He is compliant with his medications, working on his exercise progression while managing his angina symptoms and managing his hypertension and cholesterol.  His BP is higher on arrival and in a good range at discharge. We reviewed that thie exit BP can become his entry BP as he continues with meds, exercise and nutrition plan. He is compliant withhis choleaterol med.      Expected Outcomes Short: Continue to check BP regularly. Long: Continue to manage lifestyle risk factors. Short: Continue to check BP regularly. and continue to work on exercise progression as limited by anginal symptoms.  Long: Continue to manage lifestyle risk factors.                Core Components/Risk Factors/Patient Goals at Discharge (Final Review):   Goals and Risk Factor Review - 06/24/23 1325       Core Components/Risk Factors/Patient Goals Review   Personal Goals Review Weight Management/Obesity;Hypertension;Lipids    Review Timothy Garrison continues to work on maintaining his weight around 106-165 lbs. His wife is working to help him eat healthy. More fruit, no red meat.  He is compliant with his medications, working on his exercise progression while  managing his angina symptoms and managing his hypertension and cholesterol.  His BP is higher on arrival and in a good range at discharge. We reviewed that thie exit BP can become his entry BP as he continues with meds, exercise and nutrition plan. He is compliant withhis choleaterol med.    Expected Outcomes Short: Continue to check BP regularly. and continue to work on exercise progression as limited by anginal symptoms.  Long: Continue to manage lifestyle risk factors.             ITP Comments:  ITP Comments     Row Name 05/06/23 1343 05/20/23 1451 05/25/23 0953 06/16/23 0851 07/07/23 0929   ITP Comments Initial phone call completed. Diagnosis can be found in [facility] 8/30. EP Orientation scheduled for Thursday 10/3 at 9:30. Completed and gym orientation. Initial ITP created and sent for review to Dr. Bethann Punches, Medical Director. First full day of exercise!  Patient was oriented to gym and equipment including functions, settings, policies, and procedures.  Patient's individual exercise prescription and treatment plan were reviewed.  All starting workloads were established based on the results of the 6 minute walk test done at initial orientation visit.  The plan for exercise progression was also introduced and progression will be customized based on patient's performance and goals. 30 Day review completed. Medical Director ITP review done, changes made as directed, and signed approval by Medical Director.    new to  program 30 Day review completed. Medical Director ITP review done, changes made as directed, and signed approval by Medical Director.            Comments:

## 2023-07-08 ENCOUNTER — Encounter: Payer: Medicare Other | Admitting: *Deleted

## 2023-07-08 DIAGNOSIS — Z5189 Encounter for other specified aftercare: Secondary | ICD-10-CM | POA: Diagnosis not present

## 2023-07-08 DIAGNOSIS — I2089 Other forms of angina pectoris: Secondary | ICD-10-CM | POA: Diagnosis not present

## 2023-07-08 NOTE — Progress Notes (Signed)
Daily Session Note  Patient Details  Name: ROC TEEM MRN: [id number] Date of Birth: 11/07/1960 Referring Provider:   Flowsheet Row Cardiac Rehab from 05/20/2023 in [facility] Cardiac and Pulmonary Rehab  Referring Provider Vevelyn Francois, MD       Encounter Date: 07/08/2023  Check In:  Session Check In - 07/08/23 1006       Check-In   Supervising physician immediately available to respond to emergencies See telemetry face sheet for immediately available ER MD    Location [facility]-Cardiac & Pulmonary Rehab    Staff Present Ronette Deter, BS, Exercise Physiologist;Joseph Homer, RCP,RRT,BSRT;Maxon [place] BS, , Exercise Physiologist;Kenetra Hildenbrand Katrinka Blazing, RN, ADN    Virtual Visit No    Medication changes reported     No    Fall or balance concerns reported    No    Warm-up and Cool-down Performed on first and last piece of equipment    Resistance Training Performed Yes    VAD Patient? No    PAD/SET Patient? No      Pain Assessment   Currently in Pain? No/denies                Social History   Tobacco Use  Smoking Status Former   Current packs/day: 0.00   Types: Cigarettes   Quit date: 11/11/1996   Years since quitting: 26.6  Smokeless Tobacco Never    Goals Met:  Independence with exercise equipment Exercise tolerated well No report of concerns or symptoms today Strength training completed today  Goals Unmet:  Not Applicable  Comments: Pt able to follow exercise prescription today without complaint.  Will continue to monitor for progression.    Dr. Bethann Punches is Medical Director for [facility] Cardiac Rehabilitation.  Dr. Vida Rigger is Medical Director for [facility] Pulmonary Rehabilitation.

## 2023-07-12 ENCOUNTER — Encounter: Payer: Self-pay | Admitting: Internal Medicine

## 2023-07-12 ENCOUNTER — Ambulatory Visit: Payer: Medicare Other | Admitting: Internal Medicine

## 2023-07-12 VITALS — BP 136/84 | HR 90 | Temp 98.0°F | Resp 16 | Ht 68.0 in | Wt 166.0 lb

## 2023-07-12 DIAGNOSIS — Z125 Encounter for screening for malignant neoplasm of prostate: Secondary | ICD-10-CM

## 2023-07-12 DIAGNOSIS — I739 Peripheral vascular disease, unspecified: Secondary | ICD-10-CM | POA: Diagnosis not present

## 2023-07-12 DIAGNOSIS — D649 Anemia, unspecified: Secondary | ICD-10-CM

## 2023-07-12 DIAGNOSIS — M542 Cervicalgia: Secondary | ICD-10-CM | POA: Diagnosis not present

## 2023-07-12 DIAGNOSIS — I10 Essential (primary) hypertension: Secondary | ICD-10-CM | POA: Diagnosis not present

## 2023-07-12 DIAGNOSIS — I25119 Atherosclerotic heart disease of native coronary artery with unspecified angina pectoris: Secondary | ICD-10-CM | POA: Diagnosis not present

## 2023-07-12 DIAGNOSIS — Z85819 Personal history of malignant neoplasm of unspecified site of lip, oral cavity, and pharynx: Secondary | ICD-10-CM

## 2023-07-12 DIAGNOSIS — K219 Gastro-esophageal reflux disease without esophagitis: Secondary | ICD-10-CM

## 2023-07-12 DIAGNOSIS — G40909 Epilepsy, unspecified, not intractable, without status epilepticus: Secondary | ICD-10-CM

## 2023-07-12 DIAGNOSIS — I70219 Atherosclerosis of native arteries of extremities with intermittent claudication, unspecified extremity: Secondary | ICD-10-CM

## 2023-07-12 DIAGNOSIS — E7800 Pure hypercholesterolemia, unspecified: Secondary | ICD-10-CM

## 2023-07-12 DIAGNOSIS — I700 Atherosclerosis of aorta: Secondary | ICD-10-CM | POA: Diagnosis not present

## 2023-07-12 DIAGNOSIS — F419 Anxiety disorder, unspecified: Secondary | ICD-10-CM

## 2023-07-12 NOTE — Assessment & Plan Note (Signed)
No upper symptoms related.  On prilosec.

## 2023-07-12 NOTE — Assessment & Plan Note (Signed)
Buspar worked well.  Just takes prn.  Feeling better.  Notify me if any problems.

## 2023-07-12 NOTE — Assessment & Plan Note (Signed)
No recent seizures.  Saw neurology.  S/p EEG.  Tapered off lamictal. Doing well. Follow.

## 2023-07-12 NOTE — Assessment & Plan Note (Signed)
S/p fem fem bypass.  Followed by [facility].  On eliquis.  Continue pravastatin and repatha.

## 2023-07-12 NOTE — Assessment & Plan Note (Signed)
Followed by hematology for IDA. Had capsule endoscopy 02/2022 - normal small intestine with no evidence of small bowels bleeding or lesions. Had f/u with Dr Smith Robert 09/18/22.  F/u with Dr Smith Robert 05/19/23 - ferritin 15 with iron saturation 15%. Iron infusion 05/31/23 and 06/07/23

## 2023-07-12 NOTE — Assessment & Plan Note (Signed)
Stable.  Sits and rest.  Followed by vascular.  Continue risk factor modification.

## 2023-07-12 NOTE — Assessment & Plan Note (Signed)
Had f/u with cardiology 07/07/23 - f/u triple vessel CAD. Continue aspirin, pravastatin, PCSK9 inhibitor. Going to cardiac rehab.

## 2023-07-12 NOTE — Progress Notes (Signed)
Subjective:    [name], male    DOB: 02-10-1961, 62 y.o.   MRN: [id number]  Patient here for  Chief Complaint  Patient presents with   Medical Management of Chronic Issues    HPI Here for a scheduled follow up - f/u regarding hypertension, hypercholesterolemia and vascular disease. Saw neurology 02/01/23 - f/u history of seizure.  Recommended tapering lamictal. EEG - per report ok.  Off lamictal. F/u one year. Followed by hematology for IDA. Had capsule endoscopy 02/2022 - normal small intestine with no evidence of small bowels bleeding or lesions. Had f/u with Dr Smith Robert 09/18/22. Hgb - 13.2. ferritin levels wnl. Did not recommend IV iron at that time. Recommended f/u cbc and iron studies in 4 months with f/u Dr Smith Robert 8 months. F/u with Dr Smith Robert 05/19/23 - ferritin 15 with iron saturation 15%. Iron infusion 05/31/23 and 06/07/23. Has been having issues with his neck.  Evaluated by Dr Yves Dill [facility]) 05/24/23 - recommended continued home exercise and MRI. MRI of the cervical spine dated 05/31/2023 revealed moderate bilateral foraminal stenosis at C4-5. Recommended ESI. S/p ESI 07/02/23. Has f/u 08/04/23. Had f/u with cardiology 07/07/23 - f/u triple vessel CAD. Continue aspirin, pravastatin, PCSK9 inhibitor. Going to cardiac rehab. Last visit, increased stress.  Started on buspar. Has used buspar on a few occasions. Worked well.  Overall feeling better regarding increased stress.  Stays active.  No chest pain or sob reported.  No cough or congestion.  No abdominal pain or bowel change reported. Legs - stable.    Past Medical History:  Diagnosis Date   Basal cell carcinoma (BCC) of back    Basal cell carcinoma (BCC) of nasal sidewall    Dizziness    GERD (gastroesophageal reflux disease)    H/O blood clots    Headache    migraines   Hypercholesterolemia    Hypertension    hx of HBP readings   Metastatic squamous cell carcinoma 2018   bx cervical lymph node   Seizures  (HCC) 2021   Tonsillar cancer (HCC)    Vascular abnormality    VTE (venous thromboembolism) 2018   associated with chemotherapy   Past Surgical History:  Procedure Laterality Date   COLONOSCOPY     COLONOSCOPY N/A 05/08/2021   Procedure: COLONOSCOPY;  Surgeon: Jaynie Collins, DO;  Location: [facility] ENDOSCOPY;  Service: Gastroenterology;  Laterality: N/A;   endovascular balloon angioplasty femoral/popliteal artery w/stent Left 11/08/2017   ESOPHAGOGASTRODUODENOSCOPY N/A 05/08/2021   Procedure: ESOPHAGOGASTRODUODENOSCOPY (EGD);  Surgeon: Jaynie Collins, DO;  Location: [facility] ENDOSCOPY;  Service: Gastroenterology;  Laterality: N/A;   ESOPHAGOGASTRODUODENOSCOPY (EGD) WITH PROPOFOL N/A 05/24/2015   Procedure: ESOPHAGOGASTRODUODENOSCOPY (EGD) WITH PROPOFOL;  Surgeon: Scot Jun, MD;  Location: [facility] ENDOSCOPY;  Service: Endoscopy;  Laterality: N/A;   FEMORAL-FEMORAL BYPASS GRAFT     IR IMAGING GUIDED PORT INSERTION  12/09/2016   LEFT HEART CATH AND CORONARY ANGIOGRAPHY N/A 12/25/2019   Procedure: LEFT HEART CATH AND CORONARY ANGIOGRAPHY;  Surgeon: Lamar Blinks, MD;  Location: [facility] INVASIVE CV LAB;  Service: Cardiovascular;  Laterality: N/A;   SAVORY DILATION N/A 05/24/2015   Procedure: SAVORY DILATION;  Surgeon: Scot Jun, MD;  Location: [facility] ENDOSCOPY;  Service: Endoscopy;  Laterality: N/A;   skin cancer removed from face   2024   Family History  Problem Relation Age of Onset   Hyperlipidemia Mother    Heart disease Mother    Hypertension Mother    Alzheimer's  disease Mother    Prostate cancer Father    Stroke Father    Social History   Socioeconomic History   Marital status: Married    Spouse name: Not on file   Number of children: Not on file   Years of education: Not on file   Highest education level: Not on file  Occupational History   Occupation: Curator  Tobacco Use   Smoking status: Former    Current packs/day: 0.00    Types: Cigarettes     Quit date: 11/11/1996    Years since quitting: 26.6   Smokeless tobacco: Never  Vaping Use   Vaping status: Never Used  Substance and Sexual Activity   Alcohol use: Yes    Comment: occasional   Drug use: No   Sexual activity: Yes  Other Topics Concern   Not on file  Social History Narrative   Married   Social Determinants of Health   Financial Resource Strain: Low Risk  (06/25/2023)   Overall Financial Resource Strain (CARDIA)    Difficulty of Paying Living Expenses: Not hard at all  Food Insecurity: No Food Insecurity (06/25/2023)   Hunger Vital Sign    Worried About Running Out of Food in the Last Year: Never true    Ran Out of Food in the Last Year: Never true  Transportation Needs: No Transportation Needs (06/25/2023)   PRAPARE - Administrator, Civil Service (Medical): No    Lack of Transportation (Non-Medical): No  Physical Activity: Insufficiently Active (06/25/2023)   Exercise Vital Sign    Days of Exercise per Week: 3 days    Minutes of Exercise per Session: 30 min  Stress: No Stress Concern Present (06/25/2023)   [employer] of Occupational Health - Occupational Stress Questionnaire    Feeling of Stress : Only a little  Social Connections: Moderately Isolated (06/25/2023)   Social Connection and Isolation Panel [NHANES]    Frequency of Communication with Friends and Family: More than three times a week    Frequency of Social Gatherings with Friends and Family: Three times a week    Attends Religious Services: Never    Active Member of Clubs or Organizations: No    Attends Banker Meetings: Never    Marital Status: Married     Review of Systems  Constitutional:  Negative for appetite change and unexpected weight change.  HENT:  Negative for congestion and sinus pressure.   Respiratory:  Negative for cough, chest tightness and shortness of breath.   Cardiovascular:  Negative for chest pain, palpitations and leg swelling.   Gastrointestinal:  Negative for abdominal pain, diarrhea, nausea and vomiting.  Genitourinary:  Negative for difficulty urinating and dysuria.  Musculoskeletal:  Positive for neck pain. Negative for joint swelling and myalgias.  Skin:  Negative for color change and rash.  Neurological:  Negative for dizziness and headaches.  Psychiatric/Behavioral:  Negative for agitation and dysphoric mood.        Objective:     BP 136/84   Pulse 90   Temp 98 F (36.7 C)   Resp 16   Ht 5\' 8"  (1.727 m)   Wt 166 lb (75.3 kg)   SpO2 98%   BMI 25.24 kg/m  Wt Readings from Last 3 Encounters:  07/12/23 166 lb (75.3 kg)  06/25/23 163 lb (73.9 kg)  05/20/23 164 lb 6.4 oz (74.6 kg)    Physical Exam Constitutional:      General: He is not in acute  distress.    Appearance: Normal appearance. He is well-developed.  HENT:     Head: Normocephalic and atraumatic.     Right Ear: External ear normal.     Left Ear: External ear normal.     Mouth/Throat:     Pharynx: No oropharyngeal exudate or posterior oropharyngeal erythema.  Eyes:     General: No scleral icterus.       Right eye: No discharge.        Left eye: No discharge.  Cardiovascular:     Rate and Rhythm: Normal rate and regular rhythm.  Pulmonary:     Effort: Pulmonary effort is normal. No respiratory distress.     Breath sounds: Normal breath sounds.  Abdominal:     General: Bowel sounds are normal.     Palpations: Abdomen is soft.     Tenderness: There is no abdominal tenderness.  Musculoskeletal:        General: No swelling or tenderness.     Cervical back: Neck supple. No tenderness.  Lymphadenopathy:     Cervical: No cervical adenopathy.  Skin:    Findings: No erythema or rash.  Neurological:     Mental Status: He is alert.  Psychiatric:        Mood and Affect: Mood normal.        Behavior: Behavior normal.      Outpatient Encounter Medications as of 07/12/2023  Medication Sig   apixaban (ELIQUIS) 5 MG TABS tablet  Take 1 tablet (5 mg total) by mouth every 12 (twelve) hours.   aspirin EC 81 MG tablet Take 81 mg by mouth daily. Swallow whole.   busPIRone (BUSPAR) 5 MG tablet Take 1 tablet (5 mg total) by mouth daily as needed.   lisinopril (ZESTRIL) 40 MG tablet Take 1 tablet (40 mg total) by mouth daily.   Multiple Vitamin (MULTIVITAMIN) capsule Take by mouth.   nitroGLYCERIN (NITROSTAT) 0.4 MG SL tablet Place 0.4 mg under the tongue every 5 (five) minutes as needed for chest pain.   omeprazole (PRILOSEC) 20 MG capsule Take 1 capsule (20 mg total) by mouth 2 (two) times daily.   pravastatin (PRAVACHOL) 40 MG tablet Take 1 tablet by mouth daily.   REPATHA SURECLICK 140 MG/ML SOAJ INJECT 140 MG INTO THE SKIN EVERY 14 DAYS   No facility-administered encounter medications on file as of 07/12/2023.     Lab Results  Component Value Date   WBC 8.5 05/19/2023   HGB 13.4 05/19/2023   HCT 41.4 05/19/2023   PLT 384 05/19/2023   GLUCOSE 101 (H) 01/07/2023   CHOL 101 01/07/2023   TRIG 121.0 01/07/2023   HDL 44.30 01/07/2023   LDLDIRECT 163.0 09/08/2022   LDLCALC 32 01/07/2023   ALT 22 01/07/2023   AST 23 01/07/2023   NA 135 01/07/2023   K 4.8 01/07/2023   CL 101 01/07/2023   CREATININE 1.22 01/07/2023   BUN 14 01/07/2023   CO2 28 01/07/2023   TSH 2.06 03/04/2022   PSA 1.33 10/30/2021   INR 1.1 12/23/2019   HGBA1C 6.0 (H) 05/23/2019    MR CERVICAL SPINE WO CONTRAST  Result Date: 06/15/2023 CLINICAL DATA:  Cervical radiculitis. EXAM: MRI CERVICAL SPINE WITHOUT CONTRAST TECHNIQUE: Multiplanar, multisequence MR imaging of the cervical spine was performed. No intravenous contrast was administered. COMPARISON:  03/30/2021 FINDINGS: Alignment: Mild C2-3, C3-4 and C7-T1 degenerative anterolisthesis. Vertebrae: No fracture, evidence of discitis, or bone lesion. Cord: Normal signal and morphology. Posterior Fossa, vertebral arteries, paraspinal tissues: Negative.  Disc levels: Challenging assessment of the  foramina on axial images especially due to blooming on gradient series. C2-3: Degenerative facet spurring asymmetric to the left. C3-4: Degenerative facet spurring with mild anterolisthesis. Small uncovertebral spurs. Mild left foraminal narrowing by T2 weighted imaging. C4-5: Disc space narrowing with uncovertebral ridging. Degenerative facet spurring on both sides. Mild or moderate foraminal narrowing based on axial T2 weighted imaging C5-6: Mild disc bulging.  No neural impingement. C6-7: Mild disc bulging.  No neural impingement. C7-T1:Degenerative facet spurring asymmetric to the right. IMPRESSION: 1. Generalized cervical spine degeneration similar to 2022, as described. 2. Mild to moderate left foraminal narrowing at C4-5. Widely patent spinal canal. Electronically Signed   By: Tiburcio Pea M.D.   On: 06/15/2023 07:53       Assessment & Plan:  Hypercholesterolemia Assessment & Plan: Continue pravastatin and repatha. Low cholesterol diet and exercise.  Follow lipid panel and liver function tests.    Orders: -     Basic metabolic panel; Future -     Hepatic function panel; Future -     Lipid panel; Future -     TSH; Future  Atherosclerotic PVD with intermittent claudication (HCC) Assessment & Plan: S/p fem fem bypass.  Followed by [facility].  On eliquis.  Continue pravastatin and repatha.    Coronary artery disease involving native coronary artery of native heart with angina pectoris [facility]) Assessment & Plan: Had f/u with cardiology 07/07/23 - f/u triple vessel CAD. Continue aspirin, pravastatin, PCSK9 inhibitor. Going to cardiac rehab.   Essential hypertension, benign Assessment & Plan: Continue lisinopril.  Blood pressure as outlined. Continue to follow pressures.  Follow metabolic panel.  No changes in medication.   Orders: -     Basic metabolic panel; Future  Prostate cancer screening -     PSA; Future  Seizure disorder [facility]) Assessment & Plan: No recent seizures.  Saw  neurology.  S/p EEG.  Tapered off lamictal. Doing well. Follow.    Neck pain Assessment & Plan: Has seen neurosurgery.   MRI -   multilevel degenerative changes most concerning for facet arthropathy. PT.  Still with neck pain.  When increased, associated light headedness.  Continue exercises - PT and vestibular.  Given increased pain and persistent pain, was referred to Dr Yves Dill. Evaluated by Dr Yves Dill [facility]) 05/24/23 - recommended continued home exercise and MRI. MRI of the cervical spine dated 05/31/2023 revealed moderate bilateral foraminal stenosis at C4-5. Recommended ESI. S/p ESI 07/02/23. Has f/u 08/04/23.     History of malignant neoplasm of oropharynx Assessment & Plan: S/p XRT and chemo.  Followed by oncology.    Gastroesophageal reflux disease, unspecified whether esophagitis present Assessment & Plan: No upper symptoms related.  On prilosec.    Claudication [facility]) Assessment & Plan: Stable.  Sits and rest.  Followed by vascular.  Continue risk factor modification.    Aortic occlusion [facility]) Assessment & Plan: S/p fem fem bypass.  Followed by [facility].  On eliquis.  Continue pravastatin and repatha.    Anxiety Assessment & Plan: Buspar worked well.  Just takes prn.  Feeling better.  Notify me if any problems.    Anemia, unspecified type Assessment & Plan: Followed by hematology for IDA. Had capsule endoscopy 02/2022 - normal small intestine with no evidence of small bowels bleeding or lesions. Had f/u with Dr Smith Robert 09/18/22.  F/u with Dr Smith Robert 05/19/23 - ferritin 15 with iron saturation 15%. Iron infusion 05/31/23 and 06/07/23  Dale [place], MD

## 2023-07-12 NOTE — Assessment & Plan Note (Signed)
S/p XRT and chemo.  Followed by oncology.

## 2023-07-12 NOTE — Assessment & Plan Note (Signed)
Continue lisinopril.  Blood pressure as outlined. Continue to follow pressures.  Follow metabolic panel.  No changes in medication.

## 2023-07-12 NOTE — Assessment & Plan Note (Signed)
Continue pravastatin and repatha. Low cholesterol diet and exercise.  Follow lipid panel and liver function tests.

## 2023-07-12 NOTE — Assessment & Plan Note (Signed)
Has seen neurosurgery.   MRI -   multilevel degenerative changes most concerning for facet arthropathy. PT.  Still with neck pain.  When increased, associated light headedness.  Continue exercises - PT and vestibular.  Given increased pain and persistent pain, was referred to Dr Yves Dill. Evaluated by Dr Yves Dill [facility]) 05/24/23 - recommended continued home exercise and MRI. MRI of the cervical spine dated 05/31/2023 revealed moderate bilateral foraminal stenosis at C4-5. Recommended ESI. S/p ESI 07/02/23. Has f/u 08/04/23.

## 2023-07-13 ENCOUNTER — Encounter: Payer: Medicare Other | Admitting: *Deleted

## 2023-07-13 DIAGNOSIS — Z5189 Encounter for other specified aftercare: Secondary | ICD-10-CM | POA: Diagnosis not present

## 2023-07-13 DIAGNOSIS — I2089 Other forms of angina pectoris: Secondary | ICD-10-CM

## 2023-07-13 NOTE — Progress Notes (Signed)
Daily Session Note  Patient Details  Name: Timothy Garrison MRN: [id number] Date of Birth: [birth year] Referring Provider:   Flowsheet Row Cardiac Rehab from 05/20/2023 in [facility] Cardiac and Pulmonary Rehab  Referring Provider Vevelyn Francois, MD       Encounter Date: 07/13/2023  Check In:  Session Check In - 07/13/23 0939       Check-In   Supervising physician immediately available to respond to emergencies See telemetry face sheet for immediately available ER MD    Location [facility]-Cardiac & Pulmonary Rehab    Staff Present Cora Collum, RN, BSN, CCRP;Noah Tickle, BS, Exercise Physiologist;Meredith Jewel Baize, RN BSN;Margaret Best, MS, Exercise Physiologist;Maxon Conetta BS, , Exercise Physiologist    Virtual Visit No    Medication changes reported     No    Fall or balance concerns reported    No    Warm-up and Cool-down Performed on first and last piece of equipment    Resistance Training Performed Yes    VAD Patient? No    PAD/SET Patient? No      Pain Assessment   Currently in Pain? No/denies                Social History   Tobacco Use  Smoking Status Former   Current packs/day: 0.00   Types: Cigarettes   Quit date: 11/11/1996   Years since quitting: 26.6  Smokeless Tobacco Never    Goals Met:  Independence with exercise equipment Exercise tolerated well No report of concerns or symptoms today  Goals Unmet:  Not Applicable  Comments: Pt able to follow exercise prescription today without complaint.  Will continue to monitor for progression.    Dr. Bethann Punches is Medical Director for [facility] Cardiac Rehabilitation.  Dr. Vida Rigger is Medical Director for [facility] Pulmonary Rehabilitation.

## 2023-07-20 ENCOUNTER — Encounter: Payer: Medicare Other | Attending: Cardiology | Admitting: *Deleted

## 2023-07-20 DIAGNOSIS — Z5189 Encounter for other specified aftercare: Secondary | ICD-10-CM | POA: Diagnosis not present

## 2023-07-20 DIAGNOSIS — I2089 Other forms of angina pectoris: Secondary | ICD-10-CM | POA: Insufficient documentation

## 2023-07-20 NOTE — Progress Notes (Signed)
Daily Session Note  Patient Details  Name: Timothy Garrison MRN: [id number] Date of Birth: 10/20/1960 Referring Provider:   Flowsheet Row Cardiac Rehab from 05/20/2023 in [facility] Cardiac and Pulmonary Rehab  Referring Provider Vevelyn Francois, MD       Encounter Date: 07/20/2023  Check In:  Session Check In - 07/20/23 0934       Check-In   Supervising physician immediately available to respond to emergencies See telemetry face sheet for immediately available ER MD    Location [facility]-Cardiac & Pulmonary Rehab    Staff Present Cora Collum, RN, BSN, CCRP;Noah Tickle, BS, Exercise Physiologist;Margaret Best, MS, Exercise Physiologist;Maxon Conetta BS, , Exercise Physiologist    Virtual Visit No    Medication changes reported     No    Fall or balance concerns reported    No    Warm-up and Cool-down Performed on first and last piece of equipment    Resistance Training Performed Yes    VAD Patient? No    PAD/SET Patient? No      Pain Assessment   Currently in Pain? No/denies                Social History   Tobacco Use  Smoking Status Former   Current packs/day: 0.00   Types: Cigarettes   Quit date: 11/11/1996   Years since quitting: 26.7  Smokeless Tobacco Never    Goals Met:  Independence with exercise equipment Exercise tolerated well No report of concerns or symptoms today  Goals Unmet:  Not Applicable  Comments: Pt able to follow exercise prescription today without complaint.  Will continue to monitor for progression.    Dr. Bethann Punches is Medical Director for [facility] Cardiac Rehabilitation.  Dr. Vida Rigger is Medical Director for [facility] Pulmonary Rehabilitation.

## 2023-07-22 ENCOUNTER — Encounter: Payer: Medicare Other | Admitting: *Deleted

## 2023-07-22 DIAGNOSIS — I2089 Other forms of angina pectoris: Secondary | ICD-10-CM

## 2023-07-22 DIAGNOSIS — Z5189 Encounter for other specified aftercare: Secondary | ICD-10-CM | POA: Diagnosis not present

## 2023-07-22 NOTE — Progress Notes (Signed)
Daily Session Note  Patient Details  Name: Timothy Garrison MRN: [id number] Date of Birth: 08-14-1961 Referring Provider:   Flowsheet Row Cardiac Rehab from 05/20/2023 in [facility] Cardiac and Pulmonary Rehab  Referring Provider Vevelyn Francois, MD       Encounter Date: 07/22/2023  Check In:  Session Check In - 07/22/23 1001       Check-In   Supervising physician immediately available to respond to emergencies See telemetry face sheet for immediately available ER MD    Location [facility]-Cardiac & Pulmonary Rehab    Staff Present Cora Collum, RN, BSN, CCRP;Margaret Best, MS, Exercise Physiologist;Joseph Reino Kent, RCP,RRT,BSRT;Noah Tickle, BS, Exercise Physiologist    Virtual Visit No    Medication changes reported     No    Fall or balance concerns reported    No    Warm-up and Cool-down Performed on first and last piece of equipment    Resistance Training Performed Yes    VAD Patient? No    PAD/SET Patient? No      Pain Assessment   Currently in Pain? No/denies                Social History   Tobacco Use  Smoking Status Former   Current packs/day: 0.00   Types: Cigarettes   Quit date: 11/11/1996   Years since quitting: 26.7  Smokeless Tobacco Never    Goals Met:  Independence with exercise equipment Exercise tolerated well No report of concerns or symptoms today  Goals Unmet:  Not Applicable  Comments: Pt able to follow exercise prescription today without complaint.  Will continue to monitor for progression.    Dr. Bethann Punches is Medical Director for [facility] Cardiac Rehabilitation.  Dr. Vida Rigger is Medical Director for [facility] Pulmonary Rehabilitation.

## 2023-07-27 ENCOUNTER — Encounter: Payer: Medicare Other | Admitting: *Deleted

## 2023-07-27 DIAGNOSIS — I2089 Other forms of angina pectoris: Secondary | ICD-10-CM

## 2023-07-27 DIAGNOSIS — Z5189 Encounter for other specified aftercare: Secondary | ICD-10-CM | POA: Diagnosis not present

## 2023-07-27 NOTE — Progress Notes (Signed)
Daily Session Note  Patient Details  Name: Timothy Garrison MRN: [id number] Date of Birth: April 03, 1961 Referring Provider:   Flowsheet Row Cardiac Rehab from 05/20/2023 in [facility] Cardiac and Pulmonary Rehab  Referring Provider Vevelyn Francois, MD       Encounter Date: 07/27/2023  Check In:  Session Check In - 07/27/23 0936       Check-In   Supervising physician immediately available to respond to emergencies See telemetry face sheet for immediately available ER MD    Location [facility]-Cardiac & Pulmonary Rehab    Staff Present Cora Collum, RN, BSN, CCRP;Margaret Best, MS, Exercise Physiologist;Maxon Conetta BS, , Exercise Physiologist;Noah Tickle, BS, Exercise Physiologist    Virtual Visit No    Medication changes reported     No    Fall or balance concerns reported    No    Warm-up and Cool-down Performed on first and last piece of equipment    Resistance Training Performed Yes    VAD Patient? No    PAD/SET Patient? No      Pain Assessment   Currently in Pain? No/denies                Social History   Tobacco Use  Smoking Status Former   Current packs/day: 0.00   Types: Cigarettes   Quit date: 11/11/1996   Years since quitting: 26.7  Smokeless Tobacco Never    Goals Met:  Independence with exercise equipment Exercise tolerated well No report of concerns or symptoms today  Goals Unmet:  Not Applicable  Comments: Pt able to follow exercise prescription today without complaint.  Will continue to monitor for progression.    Dr. Bethann Punches is Medical Director for [facility] Cardiac Rehabilitation.  Dr. Vida Rigger is Medical Director for [facility] Pulmonary Rehabilitation.

## 2023-07-29 ENCOUNTER — Encounter: Payer: Medicare Other | Admitting: *Deleted

## 2023-07-29 DIAGNOSIS — Z5189 Encounter for other specified aftercare: Secondary | ICD-10-CM | POA: Diagnosis not present

## 2023-07-29 DIAGNOSIS — I2089 Other forms of angina pectoris: Secondary | ICD-10-CM

## 2023-07-29 NOTE — Progress Notes (Signed)
Daily Session Note  Patient Details  Name: Timothy Garrison MRN: [id number] Date of Birth: 1960/10/31 Referring Provider:   Flowsheet Row Cardiac Rehab from 05/20/2023 in [facility] Cardiac and Pulmonary Rehab  Referring Provider Vevelyn Francois, MD       Encounter Date: 07/29/2023  Check In:  Session Check In - 07/29/23 0935       Check-In   Supervising physician immediately available to respond to emergencies See telemetry face sheet for immediately available ER MD    Location [facility]-Cardiac & Pulmonary Rehab    Staff Present Ronette Deter, BS, Exercise Physiologist;Meredith Jewel Baize, RN BSN;Maxon Conetta BS, , Exercise Physiologist;Zayd Bonet Katrinka Blazing, RN, ADN    Virtual Visit No    Medication changes reported     No    Fall or balance concerns reported    No    Warm-up and Cool-down Performed on first and last piece of equipment    Resistance Training Performed Yes    VAD Patient? No    PAD/SET Patient? No      Pain Assessment   Currently in Pain? No/denies                Social History   Tobacco Use  Smoking Status Former   Current packs/day: 0.00   Types: Cigarettes   Quit date: 11/11/1996   Years since quitting: 26.7  Smokeless Tobacco Never    Goals Met:  Independence with exercise equipment Exercise tolerated well No report of concerns or symptoms today Strength training completed today  Goals Unmet:  Not Applicable  Comments: Pt able to follow exercise prescription today without complaint.  Will continue to monitor for progression.    Dr. Bethann Punches is Medical Director for [facility] Cardiac Rehabilitation.  Dr. Vida Rigger is Medical Director for [facility] Pulmonary Rehabilitation.

## 2023-08-03 ENCOUNTER — Encounter: Payer: Medicare Other | Admitting: *Deleted

## 2023-08-03 DIAGNOSIS — I2089 Other forms of angina pectoris: Secondary | ICD-10-CM

## 2023-08-03 DIAGNOSIS — M47812 Spondylosis without myelopathy or radiculopathy, cervical region: Secondary | ICD-10-CM | POA: Diagnosis not present

## 2023-08-03 DIAGNOSIS — Z5189 Encounter for other specified aftercare: Secondary | ICD-10-CM | POA: Diagnosis not present

## 2023-08-03 DIAGNOSIS — M5412 Radiculopathy, cervical region: Secondary | ICD-10-CM | POA: Diagnosis not present

## 2023-08-03 NOTE — Progress Notes (Signed)
Daily Session Note  Patient Details  Name: KEYNAN MATHENY MRN: [id number] Date of Birth: 15-Jun-1961 Referring Provider:   Flowsheet Row Cardiac Rehab from 05/20/2023 in [facility] Card[facility]  Referring Provider Vevelyn Francois, MD       Encounter Date: 08/03/2023  Check In:  Session Check In - 08/03/23 0923       Check-In   Supervising physician immediately available to respond to emergencies See telemetry face sheet for immediately available ER MD    Location [facility]-Cardiac & Pulmonary Rehab    Staff Present Cora Collum, RN, BSN, CCRP;Margaret Best, MS, Exercise Physiologist;Maxon Conetta BS, , Exercise Physiologist;Noah Tickle, BS, Exercise Physiologist    Virtual Visit No    Medication changes reported     No    Fall or balance concerns reported    No    Warm-up and Cool-down Performed on first and last piece of equipment    Resistance Training Performed Yes    VAD Patient? No    PAD/SET Patient? No      Pain Assessment   Currently in Pain? No/denies                Social History   Tobacco Use  Smoking Status Former   Current packs/day: 0.00   Types: Cigarettes   Quit date: 11/11/1996   Years since quitting: 26.7  Smokeless Tobacco Never    Goals Met:  Independence with exercise equipment Exercise tolerated well No report of concerns or symptoms today  Goals Unmet:  Not Applicable  Comments: Pt able to follow exercise prescription today without complaint.  Will continue to monitor for progression.    Dr. Bethann Punches is Medical Director for [facility] Cardiac Rehabilitation.  Dr. Vida Rigger is Medical Director for [facility] Pul[facility].

## 2023-08-04 ENCOUNTER — Encounter: Payer: Self-pay | Admitting: *Deleted

## 2023-08-04 DIAGNOSIS — I2089 Other forms of angina pectoris: Secondary | ICD-10-CM

## 2023-08-04 NOTE — Progress Notes (Signed)
Cardiac Individual Treatment Plan  Patient Details  Name: QADREE FLOREY MRN: [id number] Date of Birth: 08-30-1960 Referring Provider:   Flowsheet Row Cardiac Rehab from 05/20/2023 in [facility] Cardiac and Pulmonary Rehab  Referring Provider Vevelyn Francois, MD       Initial Encounter Date:  Flowsheet Row Cardiac Rehab from 05/20/2023 in [facility] Cardiac and Pulmonary Rehab  Date 05/20/23       Visit Diagnosis: Chronic stable angina (HCC)  Patient's Home Medications on Admission:  Current Outpatient Medications:    apixaban (ELIQUIS) 5 MG TABS tablet, Take 1 tablet (5 mg total) by mouth every 12 (twelve) hours., Disp: 180 tablet, Rfl: 1   aspirin EC 81 MG tablet, Take 81 mg by mouth daily. Swallow whole., Disp: , Rfl:    busPIRone (BUSPAR) 5 MG tablet, Take 1 tablet (5 mg total) by mouth daily as needed., Disp: 30 tablet, Rfl: 1   lisinopril (ZESTRIL) 40 MG tablet, Take 1 tablet (40 mg total) by mouth daily., Disp: 90 tablet, Rfl: 3   Multiple Vitamin (MULTIVITAMIN) capsule, Take by mouth., Disp: , Rfl:    nitroGLYCERIN (NITROSTAT) 0.4 MG SL tablet, Place 0.4 mg under the tongue every 5 (five) minutes as needed for chest pain., Disp: , Rfl:    omeprazole (PRILOSEC) 20 MG capsule, Take 1 capsule (20 mg total) by mouth 2 (two) times daily., Disp: 180 capsule, Rfl: 3   pravastatin (PRAVACHOL) 40 MG tablet, Take 1 tablet by mouth daily., Disp: , Rfl:    REPATHA SURECLICK 140 MG/ML SOAJ, INJECT 140 MG INTO THE SKIN EVERY 14 DAYS, Disp: 2 mL, Rfl: 2  Past Medical History: Past Medical History:  Diagnosis Date   Basal cell carcinoma (BCC) of back    Basal cell carcinoma (BCC) of nasal sidewall    Dizziness    GERD (gastroesophageal reflux disease)    H/O blood clots    Headache    migraines   Hypercholesterolemia    Hypertension    hx of HBP readings   Metastatic squamous cell carcinoma 2018   bx cervical lymph node   Seizures (HCC) 2021   Tonsillar cancer (HCC)    Vascular  abnormality    VTE (venous thromboembolism) 2018   associated with chemotherapy    Tobacco Use: Social History   Tobacco Use  Smoking Status Former   Current packs/day: 0.00   Types: Cigarettes   Quit date: 11/11/1996   Years since quitting: 26.7  Smokeless Tobacco Never    Labs: Review Flowsheet  More data exists      Latest Ref Rng & Units 10/30/2021 03/04/2022 06/05/2022 09/08/2022 01/07/2023  Labs for ITP Cardiac and Pulmonary Rehab  Cholestrol 0 - 200 mg/dL 409  811  914  782  956   LDL (calc) 0 - 99 mg/dL 213  086  578  - 32   Direct LDL mg/dL - - - 469.6  -  HDL-C >29.52 mg/dL 84.13  24.40  10.27  25.36  44.30   Trlycerides 0.0 - 149.0 mg/dL 644.0  347.4  259.5  638.7  121.0      Exercise Target Goals: Exercise Program Goal: Individual exercise prescription set using results from initial 6 min walk test and THRR while considering  patient's activity barriers and safety.   Exercise Prescription Goal: Initial exercise prescription builds to 30-45 minutes a day of aerobic activity, 2-3 days per week.  Home exercise guidelines will be given to patient during program as part of exercise prescription that  the participant will acknowledge.   Education: Aerobic Exercise: - Group verbal and visual presentation on the components of exercise prescription. Introduces F.I.T.T principle from ACSM for exercise prescriptions.  Reviews F.I.T.T. principles of aerobic exercise including progression. Written material given at graduation. Flowsheet Row Cardiac Rehab from 05/27/2023 in [facility] Cardiac and Pulmonary Rehab  Education need identified 05/20/23       Education: Resistance Exercise: - Group verbal and visual presentation on the components of exercise prescription. Introduces F.I.T.T principle from ACSM for exercise prescriptions  Reviews F.I.T.T. principles of resistance exercise including progression. Written material given at graduation.    Education: Exercise & Equipment  Safety: - Individual verbal instruction and demonstration of equipment use and safety with use of the equipment. Flowsheet Row Cardiac Rehab from 05/27/2023 in [facility] Cardiac and Pulmonary Rehab  Date 05/20/23  Educator MB  Instruction Review Code 1- Verbalizes Understanding       Education: Exercise Physiology & General Exercise Guidelines: - Group verbal and written instruction with models to review the exercise physiology of the cardiovascular system and associated critical values. Provides general exercise guidelines with specific guidelines to those with heart or lung disease.    Education: Flexibility, Balance, Mind/Body Relaxation: - Group verbal and visual presentation with interactive activity on the components of exercise prescription. Introduces F.I.T.T principle from ACSM for exercise prescriptions. Reviews F.I.T.T. principles of flexibility and balance exercise training including progression. Also discusses the mind body connection.  Reviews various relaxation techniques to help reduce and manage stress (i.e. Deep breathing, progressive muscle relaxation, and visualization). Balance handout provided to take home. Written material given at graduation.   Activity Barriers & Risk Stratification:  Activity Barriers & Cardiac Risk Stratification - 05/20/23 1453       Activity Barriers & Cardiac Risk Stratification   Activity Barriers Other (comment);Neck/Spine Problems    Comments leg pain with walking    Cardiac Risk Stratification High             6 Minute Walk:  6 Minute Walk     Row Name 05/20/23 1452         6 Minute Walk   Phase Initial     Distance 1330 feet     Walk Time 6 minutes     # of Rest Breaks 0     MPH 2.52     METS 3.9     RPE 12     Perceived Dyspnea  0     VO2 Peak 13.64     Symptoms Yes (comment)     Comments 4/10 Leg pain     Resting HR 85 bpm     Resting BP 146/86     Resting Oxygen Saturation  97 %     Exercise Oxygen Saturation   during 6 min walk 99 %     Max Ex. HR 107 bpm     Max Ex. BP 170/92     2 Minute Post BP 158/92              Oxygen Initial Assessment:   Oxygen Re-Evaluation:   Oxygen Discharge (Final Oxygen Re-Evaluation):   Initial Exercise Prescription:  Initial Exercise Prescription - 05/20/23 1400       Date of Initial Exercise RX and Referring Provider   Date 05/20/23    Referring Provider Vevelyn Francois, MD      Oxygen   Maintain Oxygen Saturation 88% or higher      Treadmill   MPH 2.5  Grade 0    Minutes 15    METs 2.91      Recumbant Bike   Level 3    RPM 50    Watts 45    Minutes 15    METs 3.9      NuStep   Level 3    SPM 80    Minutes 15    METs 3.9      Elliptical   Level 2    Speed 3    Minutes 15    METs 3.9      Prescription Details   Frequency (times per week) 2    Duration Progress to 30 minutes of continuous aerobic without signs/symptoms of physical distress      Intensity   THRR 40-80% of Max Heartrate 114-143    Ratings of Perceived Exertion 11-13    Perceived Dyspnea 0-4      Progression   Progression Continue to progress workloads to maintain intensity without signs/symptoms of physical distress.      Resistance Training   Training Prescription Yes    Weight 5 lb    Reps 10-15             Perform Capillary Blood Glucose checks as needed.  Exercise Prescription Changes:   Exercise Prescription Changes     Row Name 05/20/23 1400 06/04/23 0800 06/16/23 1100 07/13/23 0800 07/20/23 1000     Response to Exercise   Blood Pressure (Admit) 146/86 122/70 136/70 144/72 --   Blood Pressure (Exercise) 170/92 164/86 158/82 -- --   Blood Pressure (Exit) 146/82 124/78 104/62 110/70 --   Heart Rate (Admit) 85 bpm 93 bpm 92 bpm 116 bpm --   Heart Rate (Exercise) 107 bpm 132 bpm 145 bpm 145 bpm --   Heart Rate (Exit) 72 bpm 111 bpm 109 bpm 115 bpm --   Oxygen Saturation (Admit) 97 % -- -- -- --   Oxygen Saturation (Exercise) 99 %  -- -- -- --   Oxygen Saturation (Exit) 99 % -- -- -- --   Rating of Perceived Exertion (Exercise) 12 13 14 14  --   Perceived Dyspnea (Exercise) 0 -- -- -- --   Symptoms 4/10 leg pain none none none --   Comments results First two days of exercise -- -- --   Duration Progress to 30 minutes of  aerobic without signs/symptoms of physical distress Progress to 30 minutes of  aerobic without signs/symptoms of physical distress Continue with 30 min of aerobic exercise without signs/symptoms of physical distress. Continue with 30 min of aerobic exercise without signs/symptoms of physical distress. --   Intensity THRR New THRR unchanged THRR unchanged THRR unchanged --     Progression   Progression Continue to progress workloads to maintain intensity without signs/symptoms of physical distress. Continue to progress workloads to maintain intensity without signs/symptoms of physical distress. Continue to progress workloads to maintain intensity without signs/symptoms of physical distress. Continue to progress workloads to maintain intensity without signs/symptoms of physical distress. --   Average METs 3.9 3.05 3.24 3.6 --     Resistance Training   Training Prescription -- Yes Yes Yes --   Weight -- 5 lb 5 lb 5 lb --   Reps -- 10-15 10-15 10-15 --     Interval Training   Interval Training -- No No No --     Treadmill   MPH -- 2.2 2.6 2.6 --   Grade -- 0 0 0 --   Minutes --  30 15 15  --   METs -- 2.69 2.99 2.99 --     Recumbant Bike   Level -- 5 7 5  --   Watts -- 37 50 46 --   Minutes -- 15 15 15  --   METs -- 3.57 4.12 3.95 --     NuStep   Level -- 3 4  T6: 3 5 --   Minutes -- 15 15 15  --   METs -- 3.1 3.5  T6: 2.8 4 --     T5 Nustep   Level -- -- -- 2 --   Minutes -- -- -- 15 --   METs -- -- -- 2.6 --     Home Exercise Plan   Plans to continue exercise at -- -- -- -- Home (comment)  Plans to use his at home recumbent bike for at least 30 minutes, 3 times a week.   Frequency --  -- -- -- Add 3 additional days to program exercise sessions.   Initial Home Exercises Provided -- -- -- -- 07/20/23     Oxygen   Maintain Oxygen Saturation -- 88% or higher 88% or higher 88% or higher --    Row Name 07/29/23 1500             Response to Exercise   Blood Pressure (Admit) 142/80       Blood Pressure (Exit) 122/62       Heart Rate (Admit) 114 bpm       Heart Rate (Exercise) 140 bpm       Heart Rate (Exit) 105 bpm       Rating of Perceived Exertion (Exercise) 13       Symptoms none       Duration Continue with 30 min of aerobic exercise without signs/symptoms of physical distress.       Intensity THRR unchanged         Progression   Progression Continue to progress workloads to maintain intensity without signs/symptoms of physical distress.       Average METs 3.16         Resistance Training   Training Prescription Yes       Weight 5 lb       Reps 10-15         Interval Training   Interval Training No         Treadmill   MPH 2.6       Grade 0       Minutes 15       METs 2.99         Recumbant Bike   Level 5       Watts 46       Minutes 15       METs 3.95         NuStep   Level 5  T6: level 3       Minutes 15       METs 3.8  T6: 2.4 METs         Home Exercise Plan   Plans to continue exercise at Home (comment)  Plans to use his at home recumbent bike for at least 30 minutes, 3 times a week.       Frequency Add 3 additional days to program exercise sessions.       Initial Home Exercises Provided 07/20/23         Oxygen   Maintain Oxygen Saturation 88% or higher  Exercise Comments:   Exercise Comments     Row Name 05/25/23 [phone]           Exercise Comments First full day of exercise!  Patient was oriented to gym and equipment including functions, settings, policies, and procedures.  Patient's individual exercise prescription and treatment plan were reviewed.  All starting workloads were established based on the results  of the 6 minute walk test done at initial orientation visit.  The plan for exercise progression was also introduced and progression will be customized based on patient's performance and goals.                Exercise Goals and Review:   Exercise Goals     Row Name 05/20/23 1456             Exercise Goals   Increase Physical Activity Yes       Intervention Provide advice, education, support and counseling about physical activity/exercise needs.;Develop an individualized exercise prescription for aerobic and resistive training based on initial evaluation findings, risk stratification, comorbidities and participant's personal goals.       Expected Outcomes Short Term: Attend rehab on a regular basis to increase amount of physical activity.;Long Term: Exercising regularly at least 3-5 days a week.;Long Term: Add in home exercise to make exercise part of routine and to increase amount of physical activity.       Increase Strength and Stamina Yes       Intervention Provide advice, education, support and counseling about physical activity/exercise needs.;Develop an individualized exercise prescription for aerobic and resistive training based on initial evaluation findings, risk stratification, comorbidities and participant's personal goals.       Expected Outcomes Short Term: Increase workloads from initial exercise prescription for resistance, speed, and METs.;Short Term: Perform resistance training exercises routinely during rehab and add in resistance training at home;Long Term: Improve cardiorespiratory fitness, muscular endurance and strength as measured by increased METs and functional capacity ( )       Able to understand and use rate of perceived exertion (RPE) scale Yes       Intervention Provide education and explanation on how to use RPE scale       Expected Outcomes Short Term: Able to use RPE daily in rehab to express subjective intensity level;Long Term:  Able to use RPE to guide  intensity level when exercising independently       Able to understand and use Dyspnea scale Yes       Intervention Provide education and explanation on how to use Dyspnea scale       Expected Outcomes Short Term: Able to use Dyspnea scale daily in rehab to express subjective sense of shortness of breath during exertion;Long Term: Able to use Dyspnea scale to guide intensity level when exercising independently       Knowledge and understanding of Target Heart Rate Range (THRR) Yes       Intervention Provide education and explanation of THRR including how the numbers were predicted and where they are located for reference       Expected Outcomes Short Term: Able to state/look up THRR;Long Term: Able to use THRR to govern intensity when exercising independently;Short Term: Able to use daily as guideline for intensity in rehab       Able to check pulse independently Yes       Intervention Provide education and demonstration on how to check pulse in carotid and radial arteries.;Review the importance of being able to check your  own pulse for safety during independent exercise       Expected Outcomes Short Term: Able to explain why pulse checking is important during independent exercise;Long Term: Able to check pulse independently and accurately       Understanding of Exercise Prescription Yes       Intervention Provide education, explanation, and written materials on patient's individual exercise prescription       Expected Outcomes Short Term: Able to explain program exercise prescription;Long Term: Able to explain home exercise prescription to exercise independently                Exercise Goals Re-Evaluation :  Exercise Goals Re-Evaluation     Row Name 05/25/23 0953 06/04/23 0803 06/16/23 1137 07/13/23 0846 07/20/23 1003     Exercise Goal Re-Evaluation   Exercise Goals Review Able to understand and use rate of perceived exertion (RPE) scale;Knowledge and understanding of Target Heart Rate  Range (THRR);Able to understand and use Dyspnea scale;Understanding of Exercise Prescription Increase Physical Activity;Understanding of Exercise Prescription;Increase Strength and Stamina Increase Physical Activity;Understanding of Exercise Prescription;Increase Strength and Stamina Increase Physical Activity;Understanding of Exercise Prescription;Increase Strength and Stamina Understanding of Exercise Prescription;Able to understand and use Dyspnea scale;Increase Physical Activity;Knowledge and understanding of Target Heart Rate Range (THRR);Increase Strength and Stamina;Able to check pulse independently;Able to understand and use rate of perceived exertion (RPE) scale   Comments Reviewed RPE and dyspnea scale, THR and program prescription with pt today.  Pt voiced understanding and was given a copy of goals to take home. Furman is off to a good start in the program. He was able to walk the treadmill at a speed of 2.2 mph with no incline. He also worked at level 3 on the T4 nustep and level 5 on the recumbent bike. He was able to tolerate 5 lb hand weights for resistance training as well. We will continue to monitor his progress in the program. Taeshawn is doing well in the program. He recently increased his workload on the treadmill by increasing his speed to 2.6 mph with no incline. He also improved to level 4 on the T4 nustep and level 7 on the recumbent bike. He continues to do well with 5 lb hand weights for resistance training as well. We will continue to monitor his progress in the program. Durl continues to do well in the program. He continues to do well on the treadmill at 2.6 mph with no incline. He also improved to level 5 on the T4 nustep and level 2 on the T5 nustep  as well. We will continue to monitor his progress in the program. Reviewed home exercise with pt today.  Pt plans to use his at home recumbent bike for at least 30 minutes, 3 times a week for exercise.  Reviewed THR, pulse, RPE, sign and  symptoms, pulse oximetery and when to call 911 or MD.  Also discussed weather considerations and indoor options.  Pt voiced understanding.   Expected Outcomes Short: Use RPE daily to regulate intensity. Long: Follow program prescription in THR. Short: Continue to follow initial exercise prescription. Long: Continue exercise to improve strength and stamina. Short: Continue to progressively increase treadmill workload by adding incline. Long: Continue exercise to improve strength and stamina. Short: Continue to progressively increase treadmill workload by adding incline. Long: Continue exercise to improve strength and stamina. Short: Continue to exercise at home as well as in rehab. Long: Continue to exercise to improve strength and stamina.    Row Name  07/29/23 1553             Exercise Goal Re-Evaluation   Exercise Goals Review Increase Physical Activity;Increase Strength and Stamina;Understanding of Exercise Prescription       Comments Albino continues to do well in rehab. He has stayed consistent on the treadmill at a speed of 2.6 mph with no incline. He also continues to work at level 3 on the T6 nustep, level 5 on the T4 nustep, and level 5 on the recumbent bike. We will continue to monitor his progress in the program.       Expected Outcomes Short: Add incline to treadmill workload. Long: Continue to exercise to improve strength and stamina.                Discharge Exercise Prescription (Final Exercise Prescription Changes):  Exercise Prescription Changes - 07/29/23 1500       Response to Exercise   Blood Pressure (Admit) 142/80    Blood Pressure (Exit) 122/62    Heart Rate (Admit) 114 bpm    Heart Rate (Exercise) 140 bpm    Heart Rate (Exit) 105 bpm    Rating of Perceived Exertion (Exercise) 13    Symptoms none    Duration Continue with 30 min of aerobic exercise without signs/symptoms of physical distress.    Intensity THRR unchanged      Progression   Progression Continue  to progress workloads to maintain intensity without signs/symptoms of physical distress.    Average METs 3.16      Resistance Training   Training Prescription Yes    Weight 5 lb    Reps 10-15      Interval Training   Interval Training No      Treadmill   MPH 2.6    Grade 0    Minutes 15    METs 2.99      Recumbant Bike   Level 5    Watts 46    Minutes 15    METs 3.95      NuStep   Level 5   T6: level 3   Minutes 15    METs 3.8   T6: 2.4 METs     Home Exercise Plan   Plans to continue exercise at Home (comment)   Plans to use his at home recumbent bike for at least 30 minutes, 3 times a week.   Frequency Add 3 additional days to program exercise sessions.    Initial Home Exercises Provided 07/20/23      Oxygen   Maintain Oxygen Saturation 88% or higher             Nutrition:  Target Goals: Understanding of nutrition guidelines, daily intake of sodium 1500mg , cholesterol 200mg , calories 30% from fat and 7% or less from saturated fats, daily to have 5 or more servings of fruits and vegetables.  Education: All About Nutrition: -Group instruction provided by verbal, written material, interactive activities, discussions, models, and posters to present general guidelines for heart healthy nutrition including fat, fiber, MyPlate, the role of sodium in heart healthy nutrition, utilization of the nutrition label, and utilization of this knowledge for meal planning. Follow up email sent as well. Written material given at graduation. Flowsheet Row Cardiac Rehab from 05/27/2023 in [facility] Cardiac and Pulmonary Rehab  Education need identified 05/20/23       Biometrics:  Pre Biometrics - 05/20/23 1457       Pre Biometrics   Height 5' 7.5" (1.715 m)  Weight 164 lb 6.4 oz (74.6 kg)    Waist Circumference 36.6 inches    Hip Circumference 38 inches    Waist to Hip Ratio 0.96 %    BMI (Calculated) 25.35    Single Leg Stand 21 seconds              Nutrition  Therapy Plan and Nutrition Goals:  Nutrition Therapy & Goals - 05/20/23 1206       Nutrition Therapy   Diet Cardiac, low na    Protein (specify units) 90    Fiber 30 grams    Whole Grain Foods 3 servings    Saturated Fats 15 max. grams    Fruits and Vegetables 5 servings/day    Sodium 2 grams      Personal Nutrition Goals   Nutrition Goal Choose complex carbs and pair with healthy protein    Personal Goal #2 Include colorful produce at large meals or when eating out    Personal Goal #3 Make better snack choices    Comments Patient drinking mostly water. Sweet tea a few times a week, 6pack of beer per week. He is happy with the reductions he has made before coming to Rehab in these areas. Commended him on these changes and encouraged him to continue to work on them. He reports he eats 3 meals per day, wife cooks most of his food. He reports she is health conscious and has been helping him to reduce sodium, saturated fat, and sugary foods. Reviewed mediterranean diet handout, educated on types of fats, sources, and how to read a label. Reminded him to watch his sodium intake and choose low sodium varieties of foods he likes. He asked about iron as well. Spoke with him about using iron fortified foods like cereal, using Vitamin C to help absorption and avoiding competing nutrients like calcium at the same times. Built out several meals and snacks with foods he likes with focus on including more colorful produce and complex carbs paired with healthy protein.      Intervention Plan   Intervention Prescribe, educate and counsel regarding individualized specific dietary modifications aiming towards targeted core components such as weight, hypertension, lipid management, diabetes, heart failure and other comorbidities.;Nutrition handout(s) given to patient.    Expected Outcomes Short Term Goal: Understand basic principles of dietary content, such as calories, fat, sodium, cholesterol and  nutrients.;Short Term Goal: A plan has been developed with personal nutrition goals set during dietitian appointment.;Long Term Goal: Adherence to prescribed nutrition plan.             Nutrition Assessments:  MEDIFICTS Score Key: >=70 Need to make dietary changes  40-70 Heart Healthy Diet <= 40 Therapeutic Level Cholesterol Diet  Flowsheet Row Cardiac Rehab from 05/20/2023 in [facility] Cardiac and Pulmonary Rehab  Picture Your Plate Total Score on Admission 67      Picture Your Plate Scores: <41 Unhealthy dietary pattern with much room for improvement. 41-50 Dietary pattern unlikely to meet recommendations for good health and room for improvement. 51-60 More healthful dietary pattern, with some room for improvement.  >60 Healthy dietary pattern, although there may be some specific behaviors that could be improved.    Nutrition Goals Re-Evaluation:  Nutrition Goals Re-Evaluation     Row Name 06/15/23 1043 07/20/23 0947           Goals   Comment Teagon states that his wife has helped him with his diet. He is working on getting more protein primarily through  chicken and has cut out red meats from his diet at this time. He is also working on drinking plenty of water throughout the day. Gavinn states that he has been doing well with his eating habits. He continues to cut out red meat and get primarily chicken as his protein source. His wife has been a major help when it comes to his diet. He also continues to work on dietary changes discussed with RD.      Expected Outcome Short: Continue to work on protein intake. Long: Continue to work on heart healthy eating patterns discussed with RD. Short: Continue to work on protein intake. Long: Continue to work on heart healthy eating patterns discussed with RD.               Nutrition Goals Discharge (Final Nutrition Goals Re-Evaluation):  Nutrition Goals Re-Evaluation - 07/20/23 0947       Goals   Comment Arris states that he has  been doing well with his eating habits. He continues to cut out red meat and get primarily chicken as his protein source. His wife has been a major help when it comes to his diet. He also continues to work on dietary changes discussed with RD.    Expected Outcome Short: Continue to work on protein intake. Long: Continue to work on heart healthy eating patterns discussed with RD.             Psychosocial: Target Goals: Acknowledge presence or absence of significant depression and/or stress, maximize coping skills, provide positive support system. Participant is able to verbalize types and ability to use techniques and skills needed for reducing stress and depression.   Education: Stress, Anxiety, and Depression - Group verbal and visual presentation to define topics covered.  Reviews how body is impacted by stress, anxiety, and depression.  Also discusses healthy ways to reduce stress and to treat/manage anxiety and depression.  Written material given at graduation. Flowsheet Row Cardiac Rehab from 05/27/2023 in [facility] Cardiac and Pulmonary Rehab  Date 05/27/23  Educator SB  Instruction Review Code 1- [employer] Understanding       Education: Sleep Hygiene -Provides group verbal and written instruction about how sleep can affect your health.  Define sleep hygiene, discuss sleep cycles and impact of sleep habits. Review good sleep hygiene tips.    Initial Review & Psychosocial Screening:  Initial Psych Review & Screening - 05/06/23 1337       Initial Review   Current issues with None Identified      Family Dynamics   Good Support System? Yes   wife, friends     Barriers   Psychosocial barriers to participate in program There are no identifiable barriers or psychosocial needs.      Screening Interventions   Interventions Encouraged to exercise;To provide support and resources with identified psychosocial needs    Expected Outcomes Short Term goal: Utilizing psychosocial counselor,  staff and physician to assist with identification of specific Stressors or current issues interfering with healing process. Setting desired goal for each stressor or current issue identified.;Long Term Goal: Stressors or current issues are controlled or eliminated.;Short Term goal: Identification and review with participant of any Quality of Life or Depression concerns found by scoring the questionnaire.;Long Term goal: The participant improves quality of Life and PHQ9 Scores as seen by post scores and/or verbalization of changes             Quality of Life Scores:   Quality of Life - 05/20/23  1458       Quality of Life   Select Quality of Life      Quality of Life Scores   Health/Function Pre 22.7 %    Socioeconomic Pre 23.56 %    Psych/Spiritual Pre 24.86 %    Family Pre 27.6 %    GLOBAL Pre 24.03 %            Scores of 19 and below usually indicate a poorer quality of life in these areas.  A difference of  2-3 points is a clinically meaningful difference.  A difference of 2-3 points in the total score of the Quality of Life Index has been associated with significant improvement in overall quality of life, self-image, physical symptoms, and general health in studies assessing change in quality of life.  PHQ-9: Review Flowsheet  More data exists      06/25/2023 05/25/2023 06/22/2022 03/04/2022 06/20/2021  Depression screen PHQ 2/9  Decreased Interest 0 0 0 0 0  Down, Depressed, Hopeless 0 0 0 0 0  PHQ - 2 Score 0 0 0 0 0  Altered sleeping 0 1 - - -  Tired, decreased energy 0 1 - - -  Change in appetite 0 0 - - -  Feeling bad or failure about yourself  0 0 - - -  Trouble concentrating 0 0 - - -  Moving slowly or fidgety/restless 0 0 - - -  Suicidal thoughts 0 0 - - -  PHQ-9 Score 0 2 - - -  Difficult doing work/chores Not difficult at all Not difficult at all - - -   Interpretation of Total Score  Total Score Depression Severity:  1-4 = Minimal depression, 5-9 = Mild  depression, 10-14 = Moderate depression, 15-19 = Moderately severe depression, 20-27 = Severe depression   Psychosocial Evaluation and Intervention:  Psychosocial Evaluation - 05/06/23 1355       Psychosocial Evaluation & Interventions   Interventions Encouraged to exercise with the program and follow exercise prescription    Comments Josedejesus is coming to cardiac rehab with stable angina. He meets with his cardiologist this week to discuss options for his blockage. He currently does not struggle with chest pain but knows what medication to take if he starts. 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 His wife and friends are a great support system for him. He does have leg pain with walking which can hinder his activites but he states that he just rests some and then gets back to what he was doing. He mentions he has a positive outlook and is ready to start the program to learn what he can/should be doing.    Expected Outcomes Short: attend cardiac rehab for education and exercise. Long: Develop and maintain positive self care habits.    Continue Psychosocial Services  Follow up required by staff             Psychosocial Re-Evaluation:  Psychosocial Re-Evaluation     Row Name 06/15/23 1039 06/24/23 1027 07/20/23 0945         Psychosocial Re-Evaluation   Current issues with None Identified;Current Sleep Concerns -- None Identified     Comments Kydan reports no major stressors at this time. 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 He also states that exercise has beneficial for helping him learn his limits. His wife and friends make up a good support system for him. He has not been sleeping well as he  wakes up throughout the night, but he states this is his normal. Rafeeq states he has no concerns.  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 His wife is a great support to him. Borden states that he has no major stressors at this time. He also continues  to sleep well. His wife has been a good support system for him. He enjoys working in his shop for stress relief.     Expected Outcomes Short: Continue to relieve stress through healthy avenues. Long: Continue to maintain positive outlook. STG Kolt to continue to work in his workshop, to rec Short: Continue to work in shop for stress relief. Long: Maintain positive outlook.     Interventions Encouraged to attend Cardiac Rehabilitation for the exercise -- Encouraged to attend Cardiac Rehabilitation for the exercise     Continue Psychosocial Services  Follow up required by staff -- Follow up required by staff              Psychosocial Discharge (Final Psychosocial Re-Evaluation):  Psychosocial Re-Evaluation - 07/20/23 0945       Psychosocial Re-Evaluation   Current issues with None Identified    Comments Hawken states that he has no major stressors at this time. He also continues to sleep well. His wife has been a good support system for him. He enjoys working in his shop for stress relief.    Expected Outcomes Short: Continue to work in shop for stress relief. Long: Maintain positive outlook.    Interventions Encouraged to attend Cardiac Rehabilitation for the exercise    Continue Psychosocial Services  Follow up required by staff             Vocational Rehabilitation: Provide vocational rehab assistance to qualifying candidates.   Vocational Rehab Evaluation & Intervention:  Vocational Rehab - 05/06/23 1336       Initial Vocational Rehab Evaluation & Intervention   Assessment shows need for Vocational Rehabilitation No             Education: Education Goals: Education classes will be provided on a variety of topics geared toward better understanding of heart health and risk factor modification. Participant will state understanding/return demonstration of topics presented as noted by education test scores.  Learning Barriers/Preferences:  Learning Barriers/Preferences  - 05/06/23 1336       Learning Barriers/Preferences   Learning Barriers None    Learning Preferences None             General Cardiac Education Topics:  AED/CPR: - Group verbal and written instruction with the use of models to demonstrate the basic use of the AED with the basic ABC's of resuscitation.   Anatomy and Cardiac Procedures: - Group verbal and visual presentation and models provide information about basic cardiac anatomy and function. Reviews the testing methods done to diagnose heart disease and the outcomes of the test results. Describes the treatment choices: Medical Management, Angioplasty, or Coronary Bypass Surgery for treating various heart conditions including Myocardial Infarction, Angina, Valve Disease, and Cardiac Arrhythmias.  Written material given at graduation.   Medication Safety: - Group verbal and visual instruction to review commonly prescribed medications for heart and lung disease. Reviews the medication, class of the drug, and side effects. Includes the steps to properly store meds and maintain the prescription regimen.  Written material given at graduation.   Intimacy: - Group verbal instruction through game format to discuss how heart and lung disease can affect sexual intimacy. Written material given at  graduation..   Know Your Numbers and Heart Failure: - Group verbal and visual instruction to discuss disease risk factors for cardiac and pulmonary disease and treatment options.  Reviews associated critical values for Overweight/Obesity, Hypertension, Cholesterol, and Diabetes.  Discusses basics of heart failure: signs/symptoms and treatments.  Introduces Heart Failure Zone chart for action plan for heart failure.  Written material given at graduation.   Infection Prevention: - Provides verbal and written material to individual with discussion of infection control including proper hand washing and proper equipment cleaning during exercise  session. Flowsheet Row Cardiac Rehab from 05/27/2023 in [facility] Cardiac and Pulmonary Rehab  Date 05/20/23  Educator MB  Instruction Review Code 1- Verbalizes Understanding       Falls Prevention: - Provides verbal and written material to individual with discussion of falls prevention and safety. Flowsheet Row Cardiac Rehab from 05/27/2023 in [facility] Cardiac and Pulmonary Rehab  Date 05/20/23  Educator MB  Instruction Review Code 1- Verbalizes Understanding       Other: -Provides group and verbal instruction on various topics (see comments)   Knowledge Questionnaire Score:  Knowledge Questionnaire Score - 05/20/23 1500       Knowledge Questionnaire Score   Pre Score 24/26             Core Components/Risk Factors/Patient Goals at Admission:  Personal Goals and Risk Factors at Admission - 05/20/23 1501       Core Components/Risk Factors/Patient Goals on Admission    Weight Management Yes;Weight Maintenance    Intervention Weight Management: Develop a combined nutrition and exercise program designed to reach desired caloric intake, while maintaining appropriate intake of nutrient and fiber, sodium and fats, and appropriate energy expenditure required for the weight goal.;Weight Management: Provide education and appropriate resources to help participant work on and attain dietary goals.;Weight Management/Obesity: Establish reasonable short term and long term weight goals.    Admit Weight 164 lb 6.4 oz (74.6 kg)    Goal Weight: Short Term 164 lb 6.4 oz (74.6 kg)    Goal Weight: Long Term 164 lb 6.4 oz (74.6 kg)    Expected Outcomes Short Term: Continue to assess and modify interventions until short term weight is achieved;Long Term: Adherence to nutrition and physical activity/exercise program aimed toward attainment of established weight goal;Weight Maintenance: Understanding of the daily nutrition guidelines, which includes 25-35% calories from fat, 7% or less cal from saturated  fats, less than 200mg  cholesterol, less than 1.5gm of sodium, & 5 or more servings of fruits and vegetables daily;Understanding recommendations for meals to include 15-35% energy as protein, 25-35% energy from fat, 35-60% energy from carbohydrates, less than 200mg  of dietary cholesterol, 20-35 gm of total fiber daily;Understanding of distribution of calorie intake throughout the day with the consumption of 4-5 meals/snacks    Hypertension Yes    Intervention Provide education on lifestyle modifcations including regular physical activity/exercise, weight management, moderate sodium restriction and increased consumption of fresh fruit, vegetables, and low fat dairy, alcohol moderation, and smoking cessation.;Monitor prescription use compliance.    Expected Outcomes Short Term: Continued assessment and intervention until BP is < 140/38mm HG in hypertensive participants. < 130/65mm HG in hypertensive participants with diabetes, heart failure or chronic kidney disease.;Long Term: Maintenance of blood pressure at goal levels.    Lipids Yes    Intervention Provide education and support for participant on nutrition & aerobic/resistive exercise along with prescribed medications to achieve LDL 70mg , HDL >40mg .    Expected Outcomes Short Term: Participant states  understanding of desired cholesterol values and is compliant with medications prescribed. Participant is following exercise prescription and nutrition guidelines.;Long Term: Cholesterol controlled with medications as prescribed, with individualized exercise RX and with personalized nutrition plan. Value goals: LDL < 70mg , HDL > 40 mg.             Education:Diabetes - Individual verbal and written instruction to review signs/symptoms of diabetes, desired ranges of glucose level fasting, after meals and with exercise. Acknowledge that pre and post exercise glucose checks will be done for 3 sessions at entry of program.   Core Components/Risk  Factors/Patient Goals Review:   Goals and Risk Factor Review     Row Name 06/15/23 1045 06/24/23 1325 07/20/23 0950         Core Components/Risk Factors/Patient Goals Review   Personal Goals Review Weight Management/Obesity;Hypertension Weight Management/Obesity;Hypertension;Lipids Weight Management/Obesity;Hypertension;Lipids     Review Gailen is doing well and would like to continue to maintain his weight between 160-165 lbs. He also owns a BP cuff and uses it to check his BP about once or twice a week. He reports that his BP has stayed within normal ranges. He continues to take all of his medications as prescribed. Marcelo continues to work on maintaining his weight around 106-165 lbs. His wife is working to help him eat healthy. More fruit, no red meat.  He is compliant with his medications, working on his exercise progression while managing his angina symptoms and managing his hypertension and cholesterol.  His BP is higher on arrival and in a good range at discharge. We reviewed that thie exit BP can become his entry BP as he continues with meds, exercise and nutrition plan. He is compliant withhis choleaterol med. Roshun continues to work on maintaining his weight between 160-165 lbs. He states that he is doing well with weight maintance and weighed in today at 164.5 lb. He also continues to check his BP at home occasionally and states that it has stayed within normal ranges. He also continues to take all his medications as prescribed.     Expected Outcomes Short: Continue to check BP regularly. Long: Continue to manage lifestyle risk factors. Short: Continue to check BP regularly. and continue to work on exercise progression as limited by anginal symptoms.  Long: Continue to manage lifestyle risk factors. Short: Continue to check BP regularly. Long: Continue to monitor lifestyle risk factors.              Core Components/Risk Factors/Patient Goals at Discharge (Final Review):   Goals and  Risk Factor Review - 07/20/23 0950       Core Components/Risk Factors/Patient Goals Review   Personal Goals Review Weight Management/Obesity;Hypertension;Lipids    Review Abdulhamid continues to work on maintaining his weight between 160-165 lbs. He states that he is doing well with weight maintance and weighed in today at 164.5 lb. He also continues to check his BP at home occasionally and states that it has stayed within normal ranges. He also continues to take all his medications as prescribed.    Expected Outcomes Short: Continue to check BP regularly. Long: Continue to monitor lifestyle risk factors.             ITP Comments:  ITP Comments     Row Name 05/06/23 1343 05/20/23 1451 05/25/23 0953 06/16/23 0851 07/07/23 0929   ITP Comments Initial phone call completed. Diagnosis can be found in [facility] 8/30. EP Orientation scheduled for Thursday 10/3 at 9:30. Completed and gym  orientation. Initial ITP created and sent for review to Dr. Bethann Punches, Medical Director. First full day of exercise!  Patient was oriented to gym and equipment including functions, settings, policies, and procedures.  Patient's individual exercise prescription and treatment plan were reviewed.  All starting workloads were established based on the results of the 6 minute walk test done at initial orientation visit.  The plan for exercise progression was also introduced and progression will be customized based on patient's performance and goals. 30 Day review completed. Medical Director ITP review done, changes made as directed, and signed approval by Medical Director.    new to program 30 Day review completed. Medical Director ITP review done, changes made as directed, and signed approval by Medical Director.    Row Name 08/04/23 1144           ITP Comments 30 Day review completed. Medical Director ITP review done, changes made as directed, and signed approval by Medical Director.                Comments:

## 2023-08-05 ENCOUNTER — Encounter: Payer: Medicare Other | Admitting: *Deleted

## 2023-08-05 DIAGNOSIS — I2089 Other forms of angina pectoris: Secondary | ICD-10-CM | POA: Diagnosis not present

## 2023-08-05 DIAGNOSIS — Z5189 Encounter for other specified aftercare: Secondary | ICD-10-CM | POA: Diagnosis not present

## 2023-08-05 NOTE — Progress Notes (Signed)
Daily Session Note  Patient Details  Name: Timothy Garrison MRN: [id number] Date of Birth: 1961-02-18 Referring Provider:   Flowsheet Row Cardiac Rehab from 05/20/2023 in [facility] Cardiac and Pulmonary Rehab  Referring Provider Vevelyn Francois, MD       Encounter Date: 08/05/2023  Check In:  Session Check In - 08/05/23 0928       Check-In   Supervising physician immediately available to respond to emergencies See telemetry face sheet for immediately available ER MD    Location [facility]-Cardiac & Pulmonary Rehab    Staff Present Ronette Deter, BS, Exercise Physiologist;Joseph Reino Kent, Guinevere Ferrari, RN, ADN;Maxon [employer], , Exercise Physiologist    Virtual Visit No    Medication changes reported     No    Fall or balance concerns reported    No    Warm-up and Cool-down Performed on first and last piece of equipment    Resistance Training Performed Yes    VAD Patient? No    PAD/SET Patient? No      Pain Assessment   Currently in Pain? No/denies                Social History   Tobacco Use  Smoking Status Former   Current packs/day: 0.00   Types: Cigarettes   Quit date: 11/11/1996   Years since quitting: 26.7  Smokeless Tobacco Never    Goals Met:  Independence with exercise equipment Exercise tolerated well No report of concerns or symptoms today Strength training completed today  Goals Unmet:  Not Applicable  Comments: Pt able to follow exercise prescription today without complaint.  Will continue to monitor for progression.    Dr. Bethann Punches is Medical Director for [facility] Cardiac Rehabilitation.  Dr. Vida Rigger is Medical Director for [facility] Pulmonary Rehabilitation.

## 2023-08-06 ENCOUNTER — Other Ambulatory Visit (INDEPENDENT_AMBULATORY_CARE_PROVIDER_SITE_OTHER): Payer: Medicare Other

## 2023-08-06 DIAGNOSIS — Z125 Encounter for screening for malignant neoplasm of prostate: Secondary | ICD-10-CM

## 2023-08-06 DIAGNOSIS — E7800 Pure hypercholesterolemia, unspecified: Secondary | ICD-10-CM | POA: Diagnosis not present

## 2023-08-06 DIAGNOSIS — I10 Essential (primary) hypertension: Secondary | ICD-10-CM

## 2023-08-06 LAB — HEPATIC FUNCTION PANEL
ALT: 27 U/L (ref 0–53)
AST: 23 U/L (ref 0–37)
Albumin: 4.5 g/dL (ref 3.5–5.2)
Alkaline Phosphatase: 66 U/L (ref 39–117)
Bilirubin, Direct: 0.1 mg/dL (ref 0.0–0.3)
Total Bilirubin: 0.5 mg/dL (ref 0.2–1.2)
Total Protein: 6.9 g/dL (ref 6.0–8.3)

## 2023-08-06 LAB — LIPID PANEL
Cholesterol: 114 mg/dL (ref 0–200)
HDL: 45.2 mg/dL (ref >39.00)
LDL Cholesterol: 46 mg/dL (ref 0–99)
NonHDL: 69.1
Total CHOL/HDL Ratio: 3
Triglycerides: 114 mg/dL (ref 0.0–149.0)
VLDL: 22.8 mg/dL (ref 0.0–40.0)

## 2023-08-06 LAB — BASIC METABOLIC PANEL
BUN: 22 mg/dL (ref 6–23)
CO2: 27 meq/L (ref 19–32)
Calcium: 9.6 mg/dL (ref 8.4–10.5)
Chloride: 102 meq/L (ref 96–112)
Creatinine, Ser: 1.17 mg/dL (ref 0.40–1.50)
GFR: 66.54 mL/min (ref >60.00)
Glucose, Bld: 98 mg/dL (ref 70–99)
Potassium: 4.9 meq/L (ref 3.5–5.1)
Sodium: 138 meq/L (ref 135–145)

## 2023-08-06 LAB — TSH: TSH: 2.15 u[IU]/mL (ref 0.35–5.50)

## 2023-08-06 LAB — PSA: PSA: 1.55 ng/mL (ref 0.10–4.00)

## 2023-08-12 DIAGNOSIS — I2089 Other forms of angina pectoris: Secondary | ICD-10-CM | POA: Diagnosis not present

## 2023-08-12 DIAGNOSIS — Z5189 Encounter for other specified aftercare: Secondary | ICD-10-CM | POA: Diagnosis not present

## 2023-08-12 NOTE — Progress Notes (Signed)
Daily Session Note  Patient Details  Name: Timothy Garrison MRN: [id number] Date of Birth: Jan 14, 1961 Referring Provider:   Flowsheet Row Cardiac Rehab from 05/20/2023 in [facility] Cardiac and Pulmonary Rehab  Referring Provider Vevelyn Francois, MD       Encounter Date: 08/12/2023  Check In:  Session Check In - 08/12/23 0936       Check-In   Supervising physician immediately available to respond to emergencies See telemetry face sheet for immediately available ER MD    Location [facility]-Cardiac & Pulmonary Rehab    Staff Present Cora Collum, RN, BSN, CCRP;Joseph [place], Guinevere Ferrari, RN, [place]    Virtual Visit No    Medication changes reported     No    Fall or balance concerns reported    No    Warm-up and Cool-down Performed on first and last piece of equipment    Resistance Training Performed Yes    VAD Patient? No    PAD/SET Patient? No      Pain Assessment   Currently in Pain? No/denies                Social History   Tobacco Use  Smoking Status Former   Current packs/day: 0.00   Types: Cigarettes   Quit date: 11/11/1996   Years since quitting: 26.7  Smokeless Tobacco Never    Goals Met:  Independence with exercise equipment Exercise tolerated well No report of concerns or symptoms today Strength training completed today  Goals Unmet:  Not Applicable  Comments: Pt able to follow exercise prescription today without complaint.  Will continue to monitor for progression.    Dr. Bethann Punches is Medical Director for [facility] Cardiac Rehabilitation.  Dr. Vida Rigger is Medical Director for [facility] Pulmonary Rehabilitation.

## 2023-08-17 ENCOUNTER — Encounter: Payer: Medicare Other | Admitting: *Deleted

## 2023-08-17 DIAGNOSIS — I2089 Other forms of angina pectoris: Secondary | ICD-10-CM

## 2023-08-17 DIAGNOSIS — Z5189 Encounter for other specified aftercare: Secondary | ICD-10-CM | POA: Diagnosis not present

## 2023-08-17 NOTE — Progress Notes (Signed)
 Daily Session Note  Patient Details  Name: Timothy Garrison MRN: [id number] Date of Birth: 1960-11-10 Referring Provider:   Flowsheet Row Cardiac Rehab from 05/20/2023 in [facility] Cardiac and Pulmonary Rehab  Referring Provider Marinell How, MD       Encounter Date: 08/17/2023  Check In:  Session Check In - 08/17/23 9061       Check-In   Supervising physician immediately available to respond to emergencies See telemetry face sheet for immediately available ER MD    Location [facility]-Cardiac & Pulmonary Rehab    Staff Present Othel Durand, RN, BSN, CCRP;Margaret Best, MS, Exercise Physiologist;Maxon Conetta BS, , Exercise Physiologist;Noah Tickle, BS, Exercise Physiologist    Virtual Visit No    Medication changes reported     No    Fall or balance concerns reported    No    Warm-up and Cool-down Performed on first and last piece of equipment    Resistance Training Performed Yes    VAD Patient? No    PAD/SET Patient? No      Pain Assessment   Currently in Pain? No/denies                Social History   Tobacco Use  Smoking Status Former   Current packs/day: 0.00   Types: Cigarettes   Quit date: 11/11/1996   Years since quitting: 26.7  Smokeless Tobacco Never    Goals Met:  Independence with exercise equipment Exercise tolerated well No report of concerns or symptoms today  Goals Unmet:  Not Applicable  Comments: Pt able to follow exercise prescription today without complaint.  Will continue to monitor for progression.    Dr. Oneil Pinal is Medical Director for [facility] Cardiac Rehabilitation.  Dr. Fuad Aleskerov is Medical Director for [facility] Pulmonary Rehabilitation.

## 2023-08-19 ENCOUNTER — Encounter: Payer: Self-pay | Admitting: Oncology

## 2023-08-19 ENCOUNTER — Encounter: Payer: Medicare Other | Attending: Cardiology | Admitting: *Deleted

## 2023-08-19 DIAGNOSIS — I2089 Other forms of angina pectoris: Secondary | ICD-10-CM | POA: Diagnosis not present

## 2023-08-19 NOTE — Progress Notes (Signed)
 Daily Session Note  Patient Details  Name: SAADIQ POCHE MRN: [id number] Date of Birth: 03-Sep-1960 Referring Provider:   Flowsheet Row Cardiac Rehab from 05/20/2023 in [facility] Cardiac and Pulmonary Rehab  Referring Provider Marinell How, MD       Encounter Date: 08/19/2023  Check In:  Session Check In - 08/19/23 0945       Check-In   Supervising physician immediately available to respond to emergencies See telemetry face sheet for immediately available ER MD    Location [facility]-Cardiac & Pulmonary Rehab    Staff Present Othel Durand, RN, BSN, CCRP;Margaret Best, MS, Exercise Physiologist;Maxon Conetta BS, , Exercise Physiologist;Damarys Speir Claudene, RN, ADN    Virtual Visit No    Medication changes reported     No    Fall or balance concerns reported    No    Warm-up and Cool-down Performed on first and last piece of equipment    Resistance Training Performed Yes    VAD Patient? No    PAD/SET Patient? No      Pain Assessment   Currently in Pain? No/denies                Social History   Tobacco Use  Smoking Status Former   Current packs/day: 0.00   Types: Cigarettes   Quit date: 11/11/1996   Years since quitting: 26.7  Smokeless Tobacco Never    Goals Met:  Independence with exercise equipment Exercise tolerated well No report of concerns or symptoms today Strength training completed today  Goals Unmet:  Not Applicable  Comments: Pt able to follow exercise prescription today without complaint.  Will continue to monitor for progression.    Dr. Oneil Pinal is Medical Director for [facility] Cardiac Rehabilitation.  Dr. Fuad Aleskerov is Medical Director for [facility] Pulmonary Rehabilitation.

## 2023-08-31 ENCOUNTER — Other Ambulatory Visit: Payer: Self-pay | Admitting: Internal Medicine

## 2023-08-31 ENCOUNTER — Encounter: Payer: Medicare Other | Admitting: *Deleted

## 2023-08-31 DIAGNOSIS — I2089 Other forms of angina pectoris: Secondary | ICD-10-CM | POA: Diagnosis not present

## 2023-08-31 NOTE — Progress Notes (Signed)
 Daily Session Note  Patient Details  Name: COSIMO SCHERTZER MRN: [id number] Date of Birth: 10/04/1960 Referring Provider:   Flowsheet Row Cardiac Rehab from 05/20/2023 in [facility] Cardiac and Pulmonary Rehab  Referring Provider Marinell How, MD       Encounter Date: 08/31/2023  Check In:  Session Check In - 08/31/23 0931       Check-In   Supervising physician immediately available to respond to emergencies See telemetry face sheet for immediately available ER MD    Location [facility]-Cardiac & Pulmonary Rehab    Staff Present Othel Durand, RN, BSN, CCRP;Margaret Best, MS, Exercise Physiologist;Krista Jacques RN, BSN;Maxon Conetta BS, , Exercise Physiologist    Virtual Visit No    Medication changes reported     No    Fall or balance concerns reported    No    Warm-up and Cool-down Performed on first and last piece of equipment    Resistance Training Performed Yes    VAD Patient? No    PAD/SET Patient? No      Pain Assessment   Currently in Pain? No/denies                Social History   Tobacco Use  Smoking Status Former   Current packs/day: 0.00   Types: Cigarettes   Quit date: 11/11/1996   Years since quitting: 26.8  Smokeless Tobacco Never    Goals Met:  Independence with exercise equipment Exercise tolerated well No report of concerns or symptoms today  Goals Unmet:  Not Applicable  Comments: Pt able to follow exercise prescription today without complaint.  Will continue to monitor for progression.    Dr. Oneil Pinal is Medical Director for [facility] Cardiac Rehabilitation.  Dr. Fuad Aleskerov is Medical Director for [facility] Pulmonary Rehabilitation.

## 2023-09-01 DIAGNOSIS — I2089 Other forms of angina pectoris: Secondary | ICD-10-CM

## 2023-09-01 NOTE — Progress Notes (Signed)
 Cardiac Individual Treatment Plan  Patient Details  Name: ANEES MASTEN MRN: [id number] Date of Birth: 02/20/1961 Referring Provider:   Flowsheet Row Cardiac Rehab from 05/20/2023 in [facility] Cardiac and Pul[facility] Rehab  Referring Provider Vaughn Georges, MD       Initial Encounter Date:  Flowsheet Row Cardiac Rehab from 05/20/2023 in [facility] Cardiac and Pulmonary Rehab  Date 05/20/23       Visit Diagnosis: Chronic stable angina (HCC)  Patient's Home Medications on Admission:  Current Outpatient Medications:    apixaban  (ELIQUIS ) 5 MG TABS tablet, Take 1 tablet (5 mg total) by mouth every 12 (twelve) hours., Disp: 180 tablet, Rfl: 1   aspirin  EC 81 MG tablet, Take 81 mg by mouth daily. Swallow whole., Disp: , Rfl:    busPIRone  (BUSPAR ) 5 MG tablet, Take 1 tablet (5 mg total) by mouth daily as needed., Disp: 30 tablet, Rfl: 1   lisinopril  (ZESTRIL ) 40 MG tablet, Take 1 tablet (40 mg total) by mouth daily., Disp: 90 tablet, Rfl: 3   Multiple Vitamin (MULTIVITAMIN) capsule, Take by mouth., Disp: , Rfl:    nitroGLYCERIN  (NITROSTAT ) 0.4 MG SL tablet, Place 0.4 mg under the tongue every 5 (five) minutes as needed for chest pain., Disp: , Rfl:    omeprazole  (PRILOSEC) 20 MG capsule, Take 1 capsule (20 mg total) by mouth 2 (two) times daily., Disp: 180 capsule, Rfl: 3   pravastatin  (PRAVACHOL ) 40 MG tablet, Take 1 tablet by mouth daily., Disp: , Rfl:    REPATHA  SURECLICK 140 MG/ML SOAJ, INJECT 140 MG INTO THE SKIN EVERY 14 DAYS, Disp: 2 mL, Rfl: 2  Past Medical History: Past Medical History:  Diagnosis Date   Basal cell carcinoma (BCC) of back    Basal cell carcinoma (BCC) of nasal sidewall    Dizziness    GERD (gastroesophageal reflux disease)    H/O blood clots    Headache    migraines   Hypercholesterolemia    Hypertension    hx of HBP readings   Metastatic squamous cell carcinoma 2018   bx cervical lymph node   Seizures (HCC) 2021   Tonsillar cancer (HCC)    Vascular  abnormality    VTE (venous thromboembolism) 2018   associated with chemotherapy    Tobacco Use: Social History   Tobacco Use  Smoking Status Former   Current packs/day: 0.00   Types: Cigarettes   Quit date: 11/11/1996   Years since quitting: 26.8  Smokeless Tobacco Never    Labs: Review Flowsheet  More data exists      Latest Ref Rng & Units 03/04/2022 06/05/2022 09/08/2022 01/07/2023 08/06/2023  Labs for ITP Cardiac and Pulmonary Rehab  Cholestrol 0 - 200 mg/dL 782  956  213  086  578   LDL (calc) 0 - 99 mg/dL 469  629  - 32  46   Direct LDL mg/dL - - 528.4  - -  HDL-C >13.24 mg/dL 40.10  27.25  36.64  40.34  45.20   Trlycerides 0.0 - 149.0 mg/dL 742.5  956.3  875.6  433.2  114.0      Exercise Target Goals: Exercise Program Goal: Individual exercise prescription set using results from initial 6 min walk test and THRR while considering  patient's activity barriers and safety.   Exercise Prescription Goal: Initial exercise prescription builds to 30-45 minutes a day of aerobic activity, 2-3 days per week.  Home exercise guidelines will be given to patient during program as part of exercise prescription that  the participant will acknowledge.   Education: Aerobic Exercise: - Group verbal and visual presentation on the components of exercise prescription. Introduces F.I.T.T principle from ACSM for exercise prescriptions.  Reviews F.I.T.T. principles of aerobic exercise including progression. Written material given at graduation. Flowsheet Row Cardiac Rehab from 05/27/2023 in [facility] Cardiac and Pulmonary Rehab  Education need identified 05/20/23       Education: Resistance Exercise: - Group verbal and visual presentation on the components of exercise prescription. Introduces F.I.T.T principle from ACSM for exercise prescriptions  Reviews F.I.T.T. principles of resistance exercise including progression. Written material given at graduation.    Education: Exercise & Equipment  Safety: - Individual verbal instruction and demonstration of equipment use and safety with use of the equipment. Flowsheet Row Cardiac Rehab from 05/27/2023 in [facility] Cardiac and Pulmonary Rehab  Date 05/20/23  Educator MB  Instruction Review Code 1- Verbalizes Understanding       Education: Exercise Physiology & General Exercise Guidelines: - Group verbal and written instruction with models to review the exercise physiology of the cardiovascular system and associated critical values. Provides general exercise guidelines with specific guidelines to those with heart or lung disease.    Education: Flexibility, Balance, Mind/Body Relaxation: - Group verbal and visual presentation with interactive activity on the components of exercise prescription. Introduces F.I.T.T principle from ACSM for exercise prescriptions. Reviews F.I.T.T. principles of flexibility and balance exercise training including progression. Also discusses the mind body connection.  Reviews various relaxation techniques to help reduce and manage stress (i.e. Deep breathing, progressive muscle relaxation, and visualization). Balance handout provided to take home. Written material given at graduation.   Activity Barriers & Risk Stratification:  Activity Barriers & Cardiac Risk Stratification - 05/20/23 1453       Activity Barriers & Cardiac Risk Stratification   Activity Barriers Other (comment);Neck/Spine Problems    Comments leg pain with walking    Cardiac Risk Stratification High             6 Minute Walk:  6 Minute Walk     Row Name 05/20/23 1452         6 Minute Walk   Phase Initial     Distance 1330 feet     Walk Time 6 minutes     # of Rest Breaks 0     MPH 2.52     METS 3.9     RPE 12     Perceived Dyspnea  0     VO2 Peak 13.64     Symptoms Yes (comment)     Comments 4/10 Leg pain     Resting HR 85 bpm     Resting BP 146/86     Resting Oxygen Saturation  97 %     Exercise Oxygen Saturation   during 6 min walk 99 %     Max Ex. HR 107 bpm     Max Ex. BP 170/92     2 Minute Post BP 158/92              Oxygen Initial Assessment:   Oxygen Re-Evaluation:   Oxygen Discharge (Final Oxygen Re-Evaluation):   Initial Exercise Prescription:  Initial Exercise Prescription - 05/20/23 1400       Date of Initial Exercise RX and Referring Provider   Date 05/20/23    Referring Provider Vaughn Georges, MD      Oxygen   Maintain Oxygen Saturation 88% or higher      Treadmill   MPH 2.5  Grade 0    Minutes 15    METs 2.91      Recumbant Bike   Level 3    RPM 50    Watts 45    Minutes 15    METs 3.9      NuStep   Level 3    SPM 80    Minutes 15    METs 3.9      Elliptical   Level 2    Speed 3    Minutes 15    METs 3.9      Prescription Details   Frequency (times per week) 2    Duration Progress to 30 minutes of continuous aerobic without signs/symptoms of physical distress      Intensity   THRR 40-80% of Max Heartrate 114-143    Ratings of Perceived Exertion 11-13    Perceived Dyspnea 0-4      Progression   Progression Continue to progress workloads to maintain intensity without signs/symptoms of physical distress.      Resistance Training   Training Prescription Yes    Weight 5 lb    Reps 10-15             Perform Capillary Blood Glucose checks as needed.  Exercise Prescription Changes:   Exercise Prescription Changes     Row Name 05/20/23 1400 06/04/23 0800 06/16/23 1100 07/13/23 0800 07/20/23 1000     Response to Exercise   Blood Pressure (Admit) 146/86 122/70 136/70 144/72 --   Blood Pressure (Exercise) 170/92 164/86 158/82 -- --   Blood Pressure (Exit) 146/82 124/78 104/62 110/70 --   Heart Rate (Admit) 85 bpm 93 bpm 92 bpm 116 bpm --   Heart Rate (Exercise) 107 bpm 132 bpm 145 bpm 145 bpm --   Heart Rate (Exit) 72 bpm 111 bpm 109 bpm 115 bpm --   Oxygen Saturation (Admit) 97 % -- -- -- --   Oxygen Saturation (Exercise) 99 %  -- -- -- --   Oxygen Saturation (Exit) 99 % -- -- -- --   Rating of Perceived Exertion (Exercise) 12 13 14 14  --   Perceived Dyspnea (Exercise) 0 -- -- -- --   Symptoms 4/10 leg pain none none none --   Comments results First two days of exercise -- -- --   Duration Progress to 30 minutes of  aerobic without signs/symptoms of physical distress Progress to 30 minutes of  aerobic without signs/symptoms of physical distress Continue with 30 min of aerobic exercise without signs/symptoms of physical distress. Continue with 30 min of aerobic exercise without signs/symptoms of physical distress. --   Intensity THRR New THRR unchanged THRR unchanged THRR unchanged --     Progression   Progression Continue to progress workloads to maintain intensity without signs/symptoms of physical distress. Continue to progress workloads to maintain intensity without signs/symptoms of physical distress. Continue to progress workloads to maintain intensity without signs/symptoms of physical distress. Continue to progress workloads to maintain intensity without signs/symptoms of physical distress. --   Average METs 3.9 3.05 3.24 3.6 --     Resistance Training   Training Prescription -- Yes Yes Yes --   Weight -- 5 lb 5 lb 5 lb --   Reps -- 10-15 10-15 10-15 --     Interval Training   Interval Training -- No No No --     Treadmill   MPH -- 2.2 2.6 2.6 --   Grade -- 0 0 0 --   Minutes --  30 15 15  --   METs -- 2.69 2.99 2.99 --     Recumbant Bike   Level -- 5 7 5  --   Watts -- 37 50 46 --   Minutes -- 15 15 15  --   METs -- 3.57 4.12 3.95 --     NuStep   Level -- 3 4  T6: 3 5 --   Minutes -- 15 15 15  --   METs -- 3.1 3.5  T6: 2.8 4 --     T5 Nustep   Level -- -- -- 2 --   Minutes -- -- -- 15 --   METs -- -- -- 2.6 --     Home Exercise Plan   Plans to continue exercise at -- -- -- -- Home (comment)  Plans to use his at home recumbent bike for at least 30 minutes, 3 times a week.   Frequency --  -- -- -- Add 3 additional days to program exercise sessions.   Initial Home Exercises Provided -- -- -- -- 07/20/23     Oxygen   Maintain Oxygen Saturation -- 88% or higher 88% or higher 88% or higher --    Row Name 07/29/23 1500 08/12/23 0900 08/26/23 1600         Response to Exercise   Blood Pressure (Admit) 142/80 122/70 142/80     Blood Pressure (Exit) 122/62 142/68 120/84     Heart Rate (Admit) 114 bpm 113 bpm 124 bpm     Heart Rate (Exercise) 140 bpm 145 bpm 147 bpm     Heart Rate (Exit) 105 bpm 108 bpm 124 bpm     Rating of Perceived Exertion (Exercise) 13 13 13      Symptoms none none --     Duration Continue with 30 min of aerobic exercise without signs/symptoms of physical distress. Continue with 30 min of aerobic exercise without signs/symptoms of physical distress. Continue with 30 min of aerobic exercise without signs/symptoms of physical distress.     Intensity THRR unchanged THRR unchanged THRR unchanged       Progression   Progression Continue to progress workloads to maintain intensity without signs/symptoms of physical distress. Continue to progress workloads to maintain intensity without signs/symptoms of physical distress. Continue to progress workloads to maintain intensity without signs/symptoms of physical distress.     Average METs 3.16 3.46 3.56       Resistance Training   Training Prescription Yes Yes Yes     Weight 5 lb 5 lb 6 lb     Reps 10-15 10-15 10-15       Interval Training   Interval Training No No No       Treadmill   MPH 2.6 2.7 2.7     Grade 0 0 0     Minutes 15 15 15      METs 2.99 3.07 3.07       Recumbant Bike   Level 5 5 6      Watts 46 46 40     Minutes 15 15 15      METs 3.95 3.95 3.7       NuStep   Level 5  T6: level 3 5 5      Minutes 15 15 15      METs 3.8  T6: 2.4 METs 4.9 4.9       T5 Nustep   Level -- 3  T6 3     Minutes -- 15 15     METs -- 2.7 2.5  Home Exercise Plan   Plans to continue exercise at Home  (comment)  Plans to use his at home recumbent bike for at least 30 minutes, 3 times a week. Home (comment)  Plans to use his at home recumbent bike for at least 30 minutes, 3 times a week. Home (comment)  Plans to use his at home recumbent bike for at least 30 minutes, 3 times a week.     Frequency Add 3 additional days to program exercise sessions. Add 3 additional days to program exercise sessions. Add 3 additional days to program exercise sessions.     Initial Home Exercises Provided 07/20/23 07/20/23 07/20/23       Oxygen   Maintain Oxygen Saturation 88% or higher 88% or higher 88% or higher              Exercise Comments:   Exercise Comments     Row Name 05/25/23 0953           Exercise Comments First full day of exercise!  Patient was oriented to gym and equipment including functions, settings, policies, and procedures.  Patient's individual exercise prescription and treatment plan were reviewed.  All starting workloads were established based on the results of the 6 minute walk test done at initial orientation visit.  The plan for exercise progression was also introduced and progression will be customized based on patient's performance and goals.                Exercise Goals and Review:   Exercise Goals     Row Name 05/20/23 1456             Exercise Goals   Increase Physical Activity Yes       Intervention Provide advice, education, support and counseling about physical activity/exercise needs.;Develop an individualized exercise prescription for aerobic and resistive training based on initial evaluation findings, risk stratification, comorbidities and participant's personal goals.       Expected Outcomes Short Term: Attend rehab on a regular basis to increase amount of physical activity.;Long Term: Exercising regularly at least 3-5 days a week.;Long Term: Add in home exercise to make exercise part of routine and to increase amount of physical activity.        Increase Strength and Stamina Yes       Intervention Provide advice, education, support and counseling about physical activity/exercise needs.;Develop an individualized exercise prescription for aerobic and resistive training based on initial evaluation findings, risk stratification, comorbidities and participant's personal goals.       Expected Outcomes Short Term: Increase workloads from initial exercise prescription for resistance, speed, and METs.;Short Term: Perform resistance training exercises routinely during rehab and add in resistance training at home;Long Term: Improve cardiorespiratory fitness, muscular endurance and strength as measured by increased METs and functional capacity ( )       Able to understand and use rate of perceived exertion (RPE) scale Yes       Intervention Provide education and explanation on how to use RPE scale       Expected Outcomes Short Term: Able to use RPE daily in rehab to express subjective intensity level;Long Term:  Able to use RPE to guide intensity level when exercising independently       Able to understand and use Dyspnea scale Yes       Intervention Provide education and explanation on how to use Dyspnea scale       Expected Outcomes Short Term: Able to use Dyspnea scale daily in  rehab to express subjective sense of shortness of breath during exertion;Long Term: Able to use Dyspnea scale to guide intensity level when exercising independently       Knowledge and understanding of Target Heart Rate Range (THRR) Yes       Intervention Provide education and explanation of THRR including how the numbers were predicted and where they are located for reference       Expected Outcomes Short Term: Able to state/look up THRR;Long Term: Able to use THRR to govern intensity when exercising independently;Short Term: Able to use daily as guideline for intensity in rehab       Able to check pulse independently Yes       Intervention Provide education and demonstration  on how to check pulse in carotid and radial arteries.;Review the importance of being able to check your own pulse for safety during independent exercise       Expected Outcomes Short Term: Able to explain why pulse checking is important during independent exercise;Long Term: Able to check pulse independently and accurately       Understanding of Exercise Prescription Yes       Intervention Provide education, explanation, and written materials on patient's individual exercise prescription       Expected Outcomes Short Term: Able to explain program exercise prescription;Long Term: Able to explain home exercise prescription to exercise independently                Exercise Goals Re-Evaluation :  Exercise Goals Re-Evaluation     Row Name 05/25/23 0953 06/04/23 0803 06/16/23 1137 07/13/23 0846 07/20/23 1003     Exercise Goal Re-Evaluation   Exercise Goals Review Able to understand and use rate of perceived exertion (RPE) scale;Knowledge and understanding of Target Heart Rate Range (THRR);Able to understand and use Dyspnea scale;Understanding of Exercise Prescription Increase Physical Activity;Understanding of Exercise Prescription;Increase Strength and Stamina Increase Physical Activity;Understanding of Exercise Prescription;Increase Strength and Stamina Increase Physical Activity;Understanding of Exercise Prescription;Increase Strength and Stamina Understanding of Exercise Prescription;Able to understand and use Dyspnea scale;Increase Physical Activity;Knowledge and understanding of Target Heart Rate Range (THRR);Increase Strength and Stamina;Able to check pulse independently;Able to understand and use rate of perceived exertion (RPE) scale   Comments Reviewed RPE and dyspnea scale, THR and program prescription with pt today.  Pt voiced understanding and was given a copy of goals to take home. Kekai is off to a good start in the program. He was able to walk the treadmill at a speed of 2.2 mph with no  incline. He also worked at level 3 on the T4 nustep and level 5 on the recumbent bike. He was able to tolerate 5 lb hand weights for resistance training as well. We will continue to monitor his progress in the program. Gerad is doing well in the program. He recently increased his workload on the treadmill by increasing his speed to 2.6 mph with no incline. He also improved to level 4 on the T4 nustep and level 7 on the recumbent bike. He continues to do well with 5 lb hand weights for resistance training as well. We will continue to monitor his progress in the program. Binnie continues to do well in the program. He continues to do well on the treadmill at 2.6 mph with no incline. He also improved to level 5 on the T4 nustep and level 2 on the T5 nustep  as well. We will continue to monitor his progress in the program. Reviewed home exercise with pt  today.  Pt plans to use his at home recumbent bike for at least 30 minutes, 3 times a week for exercise.  Reviewed THR, pulse, RPE, sign and symptoms, pulse oximetery and when to call 911 or MD.  Also discussed weather considerations and indoor options.  Pt voiced understanding.   Expected Outcomes Short: Use RPE daily to regulate intensity. Long: Follow program prescription in THR. Short: Continue to follow initial exercise prescription. Long: Continue exercise to improve strength and stamina. Short: Continue to progressively increase treadmill workload by adding incline. Long: Continue exercise to improve strength and stamina. Short: Continue to progressively increase treadmill workload by adding incline. Long: Continue exercise to improve strength and stamina. Short: Continue to exercise at home as well as in rehab. Long: Continue to exercise to improve strength and stamina.    Row Name 07/29/23 1553 08/12/23 0919 08/12/23 0954 08/26/23 1626       Exercise Goal Re-Evaluation   Exercise Goals Review Increase Physical Activity;Increase Strength and  Stamina;Understanding of Exercise Prescription Increase Physical Activity;Increase Strength and Stamina;Understanding of Exercise Prescription Increase Physical Activity;Increase Strength and Stamina;Understanding of Exercise Prescription Increase Physical Activity;Increase Strength and Stamina;Understanding of Exercise Prescription    Comments Naetochukwu continues to do well in rehab. He has stayed consistent on the treadmill at a speed of 2.6 mph with no incline. He also continues to work at level 3 on the T6 nustep, level 5 on the T4 nustep, and level 5 on the recumbent bike. We will continue to monitor his progress in the program. He is working out on recumbent bike at home, 3-4 times per week ontop of coming to rehab 2 times per week. Currently on treadmill 2. here at rehab. Spoke with him about future goals of working out independently at home on days he would have come to rehab once rehab is over. He reports he understands to importnace of continuing to workout at home Eathan continues to make improvements in rehab. He was able to increase his level on the T6 nustep from level 2 to 3. He was also able to increase his speed on the treadmill from 2. to 2.7mph. We will continue to monitor his progress in the program. Savior continues to do well in rehab. He has consistently walked the treadmill at a speed of 2.7 mph with no incline. He also improved to level 6 on the recumbent bike and increased to 6 lb hand weights for resistance training. We will continue to monitor his progress in the program.    Expected Outcomes Short: Add incline to treadmill workload. Long: Continue to exercise to improve strength and stamina. STG: add inclide to treadmill as able. LTG: Continue to exercise at home to improve strength and stamina Short: Continue to progressively increase treadmill workloads. Long: Continue exercise to improve strength and stamina. Short: Begin to progressively increase treadmill workload. Long:  Continue exercise to improve strength and stamina.             Discharge Exercise Prescription (Final Exercise Prescription Changes):  Exercise Prescription Changes - 08/26/23 1600       Response to Exercise   Blood Pressure (Admit) 142/80    Blood Pressure (Exit) 120/84    Heart Rate (Admit) 124 bpm    Heart Rate (Exercise) 147 bpm    Heart Rate (Exit) 124 bpm    Rating of Perceived Exertion (Exercise) 13    Duration Continue with 30 min of aerobic exercise without signs/symptoms of physical distress.    Intensity  THRR unchanged      Progression   Progression Continue to progress workloads to maintain intensity without signs/symptoms of physical distress.    Average METs 3.56      Resistance Training   Training Prescription Yes    Weight 6 lb    Reps 10-15      Interval Training   Interval Training No      Treadmill   MPH 2.7    Grade 0    Minutes 15    METs 3.07      Recumbant Bike   Level 6    Watts 40    Minutes 15    METs 3.7      NuStep   Level 5    Minutes 15    METs 4.9      T5 Nustep   Level 3    Minutes 15    METs 2.5      Home Exercise Plan   Plans to continue exercise at Home (comment)   Plans to use his at home recumbent bike for at least 30 minutes, 3 times a week.   Frequency Add 3 additional days to program exercise sessions.    Initial Home Exercises Provided 07/20/23      Oxygen   Maintain Oxygen Saturation 88% or higher             Nutrition:  Target Goals: Understanding of nutrition guidelines, daily intake of sodium 1500mg , cholesterol 200mg , calories 30% from fat and 7% or less from saturated fats, daily to have 5 or more servings of fruits and vegetables.  Education: All About Nutrition: -Group instruction provided by verbal, written material, interactive activities, discussions, models, and posters to present general guidelines for heart healthy nutrition including fat, fiber, MyPlate, the role of sodium in heart  healthy nutrition, utilization of the nutrition label, and utilization of this knowledge for meal planning. Follow up email sent as well. Written material given at graduation. Flowsheet Row Cardiac Rehab from 05/27/2023 in [facility] Cardiac and Pulmonary Rehab  Education need identified 05/20/23       Biometrics:  Pre Biometrics - 05/20/23 1457       Pre Biometrics   Height 5' 7.5" (1.715 m)    Weight 164 lb 6.4 oz (74.6 kg)    Waist Circumference 36.6 inches    Hip Circumference 38 inches    Waist to Hip Ratio 0.96 %    BMI (Calculated) 25.35    Single Leg Stand 21 seconds              Nutrition Therapy Plan and Nutrition Goals:  Nutrition Therapy & Goals - 05/20/23 1206       Nutrition Therapy   Diet Cardiac, low na    Protein (specify units) 90    Fiber 30 grams    Whole Grain Foods 3 servings    Saturated Fats 15 max. grams    Fruits and Vegetables 5 servings/day    Sodium 2 grams      Personal Nutrition Goals   Nutrition Goal Choose complex carbs and pair with healthy protein    Personal Goal #2 Include colorful produce at large meals or when eating out    Personal Goal #3 Make better snack choices    Comments Patient drinking mostly water. Sweet tea a few times a week, 6pack of beer per week. He is happy with the reductions he has made before coming to Rehab in these areas. Commended him on these changes  and encouraged him to continue to work on them. He reports he eats 3 meals per day, wife cooks most of his food. He reports she is health conscious and has been helping him to reduce sodium, saturated fat, and sugary foods. Reviewed mediterranean diet handout, educated on types of fats, sources, and how to read a label. Reminded him to watch his sodium intake and choose low sodium varieties of foods he likes. He asked about iron as well. Spoke with him about using iron fortified foods like cereal, using Vitamin C to help absorption and avoiding competing nutrients like  calcium  at the same times. Built out several meals and snacks with foods he likes with focus on including more colorful produce and complex carbs paired with healthy protein.      Intervention Plan   Intervention Prescribe, educate and counsel regarding individualized specific dietary modifications aiming towards targeted core components such as weight, hypertension, lipid management, diabetes, heart failure and other comorbidities.;Nutrition handout(s) given to patient.    Expected Outcomes Short Term Goal: Understand basic principles of dietary content, such as calories, fat, sodium, cholesterol and nutrients.;Short Term Goal: A plan has been developed with personal nutrition goals set during dietitian appointment.;Long Term Goal: Adherence to prescribed nutrition plan.             Nutrition Assessments:  MEDIFICTS Score Key: >=70 Need to make dietary changes  40-70 Heart Healthy Diet <= 40 Therapeutic Level Cholesterol Diet  Flowsheet Row Cardiac Rehab from 05/20/2023 in [facility] Cardiac and Pulmonary Rehab  Picture Your Plate Total Score on Admission 67      Picture Your Plate Scores: <16 Unhealthy dietary pattern with much room for improvement. 41-50 Dietary pattern unlikely to meet recommendations for good health and room for improvement. 51-60 More healthful dietary pattern, with some room for improvement.  >60 Healthy dietary pattern, although there may be some specific behaviors that could be improved.    Nutrition Goals Re-Evaluation:  Nutrition Goals Re-Evaluation     Row Name 06/15/23 1043 07/20/23 0947 08/12/23 0923         Goals   Comment Abdulwahab states that his wife has helped him with his diet. He is working on getting more protein primarily through chicken and has cut out red meats from his diet at this time. He is also working on drinking plenty of water throughout the day. Graig states that he has been doing well with his eating habits. He continues to cut out red  meat and get primarily chicken as his protein source. His wife has been a major help when it comes to his diet. He also continues to work on dietary changes discussed with RD. He feels he still working on nutrition goals of cutting back on saturated fat while keeping protein up. Spoke about plant based protein to help him meet protein goals and increase fiber intake. He is happy with his weight, encouraged nutrient dense foods rather than junk and to avoid sugary beverages.     Expected Outcome Short: Continue to work on protein intake. Long: Continue to work on heart healthy eating patterns discussed with RD. Short: Continue to work on protein intake. Long: Continue to work on heart healthy eating patterns discussed with RD. STG: COntinue to reduce saturated fat and keep protein intake up. LTG: maintain weight and follow heart healthy diet              Nutrition Goals Discharge (Final Nutrition Goals Re-Evaluation):  Nutrition Goals Re-Evaluation -  08/12/23 8119       Goals   Comment He feels he still working on nutrition goals of cutting back on saturated fat while keeping protein up. Spoke about plant based protein to help him meet protein goals and increase fiber intake. He is happy with his weight, encouraged nutrient dense foods rather than junk and to avoid sugary beverages.    Expected Outcome STG: COntinue to reduce saturated fat and keep protein intake up. LTG: maintain weight and follow heart healthy diet             Psychosocial: Target Goals: Acknowledge presence or absence of significant depression and/or stress, maximize coping skills, provide positive support system. Participant is able to verbalize types and ability to use techniques and skills needed for reducing stress and depression.   Education: Stress, Anxiety, and Depression - Group verbal and visual presentation to define topics covered.  Reviews how body is impacted by stress, anxiety, and depression.  Also  discusses healthy ways to reduce stress and to treat/manage anxiety and depression.  Written material given at graduation. Flowsheet Row Cardiac Rehab from 05/27/2023 in [facility] Cardiac and Pulmonary Rehab  Date 05/27/23  Educator SB  Instruction Review Code 1- [employer] Understanding       Education: Sleep Hygiene -Provides group verbal and written instruction about how sleep can affect your health.  Define sleep hygiene, discuss sleep cycles and impact of sleep habits. Review good sleep hygiene tips.    Initial Review & Psychosocial Screening:  Initial Psych Review & Screening - 05/06/23 1337       Initial Review   Current issues with None Identified      Family Dynamics   Good Support System? Yes   wife, friends     Barriers   Psychosocial barriers to participate in program There are no identifiable barriers or psychosocial needs.      Screening Interventions   Interventions Encouraged to exercise;To provide support and resources with identified psychosocial needs    Expected Outcomes Short Term goal: Utilizing psychosocial counselor, staff and physician to assist with identification of specific Stressors or current issues interfering with healing process. Setting desired goal for each stressor or current issue identified.;Long Term Goal: Stressors or current issues are controlled or eliminated.;Short Term goal: Identification and review with participant of any Quality of Life or Depression concerns found by scoring the questionnaire.;Long Term goal: The participant improves quality of Life and PHQ9 Scores as seen by post scores and/or verbalization of changes             Quality of Life Scores:   Quality of Life - 05/20/23 1458       Quality of Life   Select Quality of Life      Quality of Life Scores   Health/Function Pre 22.7 %    Socioeconomic Pre 23.56 %    Psych/Spiritual Pre 24.86 %    Family Pre 27.6 %    GLOBAL Pre 24.03 %            Scores of 19 and  below usually indicate a poorer quality of life in these areas.  A difference of  2-3 points is a clinically meaningful difference.  A difference of 2-3 points in the total score of the Quality of Life Index has been associated with significant improvement in overall quality of life, self-image, physical symptoms, and general health in studies assessing change in quality of life.  PHQ-9: Review Flowsheet  More data exists  06/25/2023 05/25/2023 06/22/2022 03/04/2022 06/20/2021  Depression screen PHQ 2/9  Decreased Interest 0 0 0 0 0  Down, Depressed, Hopeless 0 0 0 0 0  PHQ - 2 Score 0 0 0 0 0  Altered sleeping 0 1 - - -  Tired, decreased energy 0 1 - - -  Change in appetite 0 0 - - -  Feeling bad or failure about yourself  0 0 - - -  Trouble concentrating 0 0 - - -  Moving slowly or fidgety/restless 0 0 - - -  Suicidal thoughts 0 0 - - -  PHQ-9 Score 0 2 - - -  Difficult doing work/chores Not difficult at all Not difficult at all - - -   Interpretation of Total Score  Total Score Depression Severity:  1-4 = Minimal depression, 5-9 = Mild depression, 10-14 = Moderate depression, 15-19 = Moderately severe depression, 20-27 = Severe depression   Psychosocial Evaluation and Intervention:  Psychosocial Evaluation - 05/06/23 1355       Psychosocial Evaluation & Interventions   Interventions Encouraged to exercise with the program and follow exercise prescription    Comments Shannon is coming to cardiac rehab with stable angina. He meets with his cardiologist this week to discuss options for his blockage. He currently does not struggle with chest pain but knows what medication to take if he starts. 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 His wife and friends are a great support system for him. He does have leg pain with walking which can hinder his activites but he states that he just rests some and then gets back to what he was doing. He mentions he has a positive outlook and is ready to  start the program to learn what he can/should be doing.    Expected Outcomes Short: attend cardiac rehab for education and exercise. Long: Develop and maintain positive self care habits.    Continue Psychosocial Services  Follow up required by staff             Psychosocial Re-Evaluation:  Psychosocial Re-Evaluation     Row Name 06/15/23 1039 06/24/23 1027 07/20/23 0945 08/12/23 1914       Psychosocial Re-Evaluation   Current issues with None Identified;Current Sleep Concerns -- None Identified None Identified    Comments Charels reports no major stressors at this time. 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 He also states that exercise has beneficial for helping him learn his limits. His wife and friends make up a good support system for him. He has not been sleeping well as he wakes up throughout the night, but he states this is his normal. Alessander states he has no concerns.  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 His wife is a great support to him. Seanpaul states that he has no major stressors at this time. He also continues to sleep well. His wife has been a good support system for him. He enjoys working in his shop for stress relief. He states he is doing well with sleep, currently getting ~7hrs each night. Wakes up a few times to use restroom. No stressors, anxeity or depression currently. He has his wife at home to help him if any of these issues should arise.    Expected Outcomes Short: Continue to relieve stress through healthy avenues. Long: Continue to maintain positive outlook. STG Kuzey to continue to work in his workshop, to rec Short: Continue to  work in shop for stress relief. Long: Maintain positive outlook. STG: continue to use support system and hobbies of enjoyment like working in shop to help relieve stress. LTG: maintain positive outlook on health outcomes and daily life    Interventions Encouraged to attend Cardiac Rehabilitation  for the exercise -- Encouraged to attend Cardiac Rehabilitation for the exercise Encouraged to attend Cardiac Rehabilitation for the exercise    Continue Psychosocial Services  Follow up required by staff -- Follow up required by staff Follow up required by staff             Psychosocial Discharge (Final Psychosocial Re-Evaluation):  Psychosocial Re-Evaluation - 08/12/23 0921       Psychosocial Re-Evaluation   Current issues with None Identified    Comments He states he is doing well with sleep, currently getting ~7hrs each night. Wakes up a few times to use restroom. No stressors, anxeity or depression currently. He has his wife at home to help him if any of these issues should arise.    Expected Outcomes STG: continue to use support system and hobbies of enjoyment like working in shop to help relieve stress. LTG: maintain positive outlook on health outcomes and daily life    Interventions Encouraged to attend Cardiac Rehabilitation for the exercise    Continue Psychosocial Services  Follow up required by staff             Vocational Rehabilitation: Provide vocational rehab assistance to qualifying candidates.   Vocational Rehab Evaluation & Intervention:  Vocational Rehab - 05/06/23 1336       Initial Vocational Rehab Evaluation & Intervention   Assessment shows need for Vocational Rehabilitation No             Education: Education Goals: Education classes will be provided on a variety of topics geared toward better understanding of heart health and risk factor modification. Participant will state understanding/return demonstration of topics presented as noted by education test scores.  Learning Barriers/Preferences:  Learning Barriers/Preferences - 05/06/23 1336       Learning Barriers/Preferences   Learning Barriers None    Learning Preferences None             General Cardiac Education Topics:  AED/CPR: - Group verbal and written instruction with the  use of models to demonstrate the basic use of the AED with the basic ABC's of resuscitation.   Anatomy and Cardiac Procedures: - Group verbal and visual presentation and models provide information about basic cardiac anatomy and function. Reviews the testing methods done to diagnose heart disease and the outcomes of the test results. Describes the treatment choices: Medical Management, Angioplasty, or Coronary Bypass Surgery for treating various heart conditions including Myocardial Infarction, Angina, Valve Disease, and Cardiac Arrhythmias.  Written material given at graduation.   Medication Safety: - Group verbal and visual instruction to review commonly prescribed medications for heart and lung disease. Reviews the medication, class of the drug, and side effects. Includes the steps to properly store meds and maintain the prescription regimen.  Written material given at graduation.   Intimacy: - Group verbal instruction through game format to discuss how heart and lung disease can affect sexual intimacy. Written material given at graduation..   Know Your Numbers and Heart Failure: - Group verbal and visual instruction to discuss disease risk factors for cardiac and pulmonary disease and treatment options.  Reviews associated critical values for Overweight/Obesity, Hypertension, Cholesterol, and Diabetes.  Discusses basics of heart failure: signs/symptoms and treatments.  Introduces Heart Failure Zone chart for action plan for heart failure.  Written material given at graduation.   Infection Prevention: - Provides verbal and written material to individual with discussion of infection control including proper hand washing and proper equipment cleaning during exercise session. Flowsheet Row Cardiac Rehab from 05/27/2023 in [facility] Cardiac and Pulmonary Rehab  Date 05/20/23  Educator MB  Instruction Review Code 1- Verbalizes Understanding       Falls Prevention: - Provides verbal and written  material to individual with discussion of falls prevention and safety. Flowsheet Row Cardiac Rehab from 05/27/2023 in [facility] Cardiac and Pulmonary Rehab  Date 05/20/23  Educator MB  Instruction Review Code 1- Verbalizes Understanding       Other: -Provides group and verbal instruction on various topics (see comments)   Knowledge Questionnaire Score:  Knowledge Questionnaire Score - 05/20/23 1500       Knowledge Questionnaire Score   Pre Score 24/26             Core Components/Risk Factors/Patient Goals at Admission:  Personal Goals and Risk Factors at Admission - 05/20/23 1501       Core Components/Risk Factors/Patient Goals on Admission    Weight Management Yes;Weight Maintenance    Intervention Weight Management: Develop a combined nutrition and exercise program designed to reach desired caloric intake, while maintaining appropriate intake of nutrient and fiber, sodium and fats, and appropriate energy expenditure required for the weight goal.;Weight Management: Provide education and appropriate resources to help participant work on and attain dietary goals.;Weight Management/Obesity: Establish reasonable short term and long term weight goals.    Admit Weight 164 lb 6.4 oz (74.6 kg)    Goal Weight: Short Term 164 lb 6.4 oz (74.6 kg)    Goal Weight: Long Term 164 lb 6.4 oz (74.6 kg)    Expected Outcomes Short Term: Continue to assess and modify interventions until short term weight is achieved;Long Term: Adherence to nutrition and physical activity/exercise program aimed toward attainment of established weight goal;Weight Maintenance: Understanding of the daily nutrition guidelines, which includes 25-35% calories from fat, 7% or less cal from saturated fats, less than 200mg  cholesterol, less than 1.5gm of sodium, & 5 or more servings of fruits and vegetables daily;Understanding recommendations for meals to include 15-35% energy as protein, 25-35% energy from fat, 35-60% energy from  carbohydrates, less than 200mg  of dietary cholesterol, 20-35 gm of total fiber daily;Understanding of distribution of calorie intake throughout the day with the consumption of 4-5 meals/snacks    Hypertension Yes    Intervention Provide education on lifestyle modifcations including regular physical activity/exercise, weight management, moderate sodium restriction and increased consumption of fresh fruit, vegetables, and low fat dairy, alcohol moderation, and smoking cessation.;Monitor prescription use compliance.    Expected Outcomes Short Term: Continued assessment and intervention until BP is < 140/79mm HG in hypertensive participants. < 130/16mm HG in hypertensive participants with diabetes, heart failure or chronic kidney disease.;Long Term: Maintenance of blood pressure at goal levels.    Lipids Yes    Intervention Provide education and support for participant on nutrition & aerobic/resistive exercise along with prescribed medications to achieve LDL 70mg , HDL >40mg .    Expected Outcomes Short Term: Participant states understanding of desired cholesterol values and is compliant with medications prescribed. Participant is following exercise prescription and nutrition guidelines.;Long Term: Cholesterol controlled with medications as prescribed, with individualized exercise RX and with personalized nutrition plan. Value goals: LDL < 70mg , HDL > 40 mg.  Education:Diabetes - Individual verbal and written instruction to review signs/symptoms of diabetes, desired ranges of glucose level fasting, after meals and with exercise. Acknowledge that pre and post exercise glucose checks will be done for 3 sessions at entry of program.   Core Components/Risk Factors/Patient Goals Review:   Goals and Risk Factor Review     Row Name 06/15/23 1045 06/24/23 1325 07/20/23 0950 08/12/23 0925       Core Components/Risk Factors/Patient Goals Review   Personal Goals Review Weight  Management/Obesity;Hypertension Weight Management/Obesity;Hypertension;Lipids Weight Management/Obesity;Hypertension;Lipids Hypertension;Weight Management/Obesity;Lipids    Review Thorin is doing well and would like to continue to maintain his weight between 160-165 lbs. He also owns a BP cuff and uses it to check his BP about once or twice a week. He reports that his BP has stayed within normal ranges. He continues to take all of his medications as prescribed. Bandy continues to work on maintaining his weight around 106-165 lbs. His wife is working to help him eat healthy. More fruit, no red meat.  He is compliant with his medications, working on his exercise progression while managing his angina symptoms and managing his hypertension and cholesterol.  His BP is higher on arrival and in a good range at discharge. We reviewed that thie exit BP can become his entry BP as he continues with meds, exercise and nutrition plan. He is compliant withhis choleaterol med. Nickey continues to work on maintaining his weight between 160-165 lbs. He states that he is doing well with weight maintance and weighed in today at 164.5 lb. He also continues to check his BP at home occasionally and states that it has stayed within normal ranges. He also continues to take all his medications as prescribed. He is focusing on maintianing his weight of ~160-165lbs. He his happy with this weight. He check his BP at home, 120-130/75-85. He is taking his medications as precribed and working on making exercise and hearth healthy dietary changes more of a habit to improve LDL and HDL    Expected Outcomes Short: Continue to check BP regularly. Long: Continue to manage lifestyle risk factors. Short: Continue to check BP regularly. and continue to work on exercise progression as limited by anginal symptoms.  Long: Continue to manage lifestyle risk factors. Short: Continue to check BP regularly. Long: Continue to monitor lifestyle risk factors.  STG: Continue to check BP at home take meds. LTG maintain weight follow heart healthy lifestyle             Core Components/Risk Factors/Patient Goals at Discharge (Final Review):   Goals and Risk Factor Review - 08/12/23 0925       Core Components/Risk Factors/Patient Goals Review   Personal Goals Review Hypertension;Weight Management/Obesity;Lipids    Review He is focusing on maintianing his weight of ~160-165lbs. He his happy with this weight. He check his BP at home, 120-130/75-85. He is taking his medications as precribed and working on making exercise and hearth healthy dietary changes more of a habit to improve LDL and HDL    Expected Outcomes STG: Continue to check BP at home take meds. LTG maintain weight follow heart healthy lifestyle             ITP Comments:  ITP Comments     Row Name 05/06/23 1343 05/20/23 1451 05/25/23 0953 06/16/23 0851 07/07/23 0929   ITP Comments Initial phone call completed. Diagnosis can be found in [facility] 8/30. EP Orientation scheduled for Thursday 10/3 at 9:30. Completed and  gym orientation. Initial ITP created and sent for review to Dr. Firman Hughes, Medical Director. First full day of exercise!  Patient was oriented to gym and equipment including functions, settings, policies, and procedures.  Patient's individual exercise prescription and treatment plan were reviewed.  All starting workloads were established based on the results of the 6 minute walk test done at initial orientation visit.  The plan for exercise progression was also introduced and progression will be customized based on patient's performance and goals. 30 Day review completed. Medical Director ITP review done, changes made as directed, and signed approval by Medical Director.    new to program 30 Day review completed. Medical Director ITP review done, changes made as directed, and signed approval by Medical Director.    Row Name 08/04/23 1144           ITP Comments 30 Day  review completed. Medical Director ITP review done, changes made as directed, and signed approval by Medical Director.                Comments: 30 day review

## 2023-09-02 DIAGNOSIS — D225 Melanocytic nevi of trunk: Secondary | ICD-10-CM | POA: Diagnosis not present

## 2023-09-02 DIAGNOSIS — Z85820 Personal history of malignant melanoma of skin: Secondary | ICD-10-CM | POA: Diagnosis not present

## 2023-09-02 DIAGNOSIS — D2261 Melanocytic nevi of right upper limb, including shoulder: Secondary | ICD-10-CM | POA: Diagnosis not present

## 2023-09-02 DIAGNOSIS — Z85828 Personal history of other malignant neoplasm of skin: Secondary | ICD-10-CM | POA: Diagnosis not present

## 2023-09-02 DIAGNOSIS — L814 Other melanin hyperpigmentation: Secondary | ICD-10-CM | POA: Diagnosis not present

## 2023-09-02 DIAGNOSIS — Z08 Encounter for follow-up examination after completed treatment for malignant neoplasm: Secondary | ICD-10-CM | POA: Diagnosis not present

## 2023-09-02 DIAGNOSIS — L565 Disseminated superficial actinic porokeratosis (DSAP): Secondary | ICD-10-CM | POA: Diagnosis not present

## 2023-09-02 DIAGNOSIS — D2262 Melanocytic nevi of left upper limb, including shoulder: Secondary | ICD-10-CM | POA: Diagnosis not present

## 2023-09-07 DIAGNOSIS — I2089 Other forms of angina pectoris: Secondary | ICD-10-CM

## 2023-09-07 NOTE — Progress Notes (Signed)
Daily Session Note  Patient Details  Name: Timothy Garrison MRN: [id number] Date of Birth: 1960-10-17 Referring Provider:   Flowsheet Row Cardiac Rehab from 05/20/2023 in [facility] Cardiac and Pulmonary Rehab  Referring Provider Vevelyn Francois, MD       Encounter Date: 09/07/2023  Check In:  Session Check In - 09/07/23 0908       Check-In   Supervising physician immediately available to respond to emergencies See telemetry face sheet for immediately available ER MD    Location [facility]-Cardiac & Pulmonary Rehab    Staff Present Kelton Pillar RN,BSN,MPA;Margaret Best, MS, Exercise Physiologist;Maxon Conetta BS, Exercise Physiologist;Noah Tickle, BS, Exercise Physiologist    Virtual Visit No    Medication changes reported     No    Fall or balance concerns reported    No    Warm-up and Cool-down Performed on first and last piece of equipment    Resistance Training Performed Yes    VAD Patient? No    PAD/SET Patient? No      Pain Assessment   Currently in Pain? No/denies                Social History   Tobacco Use  Smoking Status Former   Current packs/day: 0.00   Types: Cigarettes   Quit date: 11/11/1996   Years since quitting: 26.8  Smokeless Tobacco Never    Goals Met:  Independence with exercise equipment Exercise tolerated well No report of concerns or symptoms today Strength training completed today  Goals Unmet:  Not Applicable  Comments: Pt able to follow exercise prescription today without complaint.  Will continue to monitor for progression.    Dr. Bethann Punches is Medical Director for [facility] Cardiac Rehabilitation.  Dr. Vida Rigger is Medical Director for [facility] Pulmonary Rehabilitation.

## 2023-09-09 ENCOUNTER — Encounter: Payer: Medicare Other | Admitting: *Deleted

## 2023-09-09 DIAGNOSIS — I2089 Other forms of angina pectoris: Secondary | ICD-10-CM | POA: Diagnosis not present

## 2023-09-09 NOTE — Progress Notes (Signed)
Daily Session Note  Patient Details  Name: Timothy Garrison MRN: [id number] Date of Birth: 1961-06-26 Referring Provider:   Flowsheet Row Cardiac Rehab from 05/20/2023 in [facility] Cardiac and Pulmonary Rehab  Referring Provider Vevelyn Francois, MD       Encounter Date: 09/09/2023  Check In:  Session Check In - 09/09/23 0933       Check-In   Supervising physician immediately available to respond to emergencies See telemetry face sheet for immediately available ER MD    Location [facility]-Cardiac & Pulmonary Rehab    Staff Present Rory Percy, MS, Exercise Physiologist;Elishua Radford, RN, BSN, CCRP;Jason Wallace Cullens RDN,LDN    Virtual Visit No    Medication changes reported     No    Fall or balance concerns reported    No    Warm-up and Cool-down Performed on first and last piece of equipment    Resistance Training Performed Yes    VAD Patient? No    PAD/SET Patient? No      Pain Assessment   Currently in Pain? No/denies                Social History   Tobacco Use  Smoking Status Former   Current packs/day: 0.00   Types: Cigarettes   Quit date: 11/11/1996   Years since quitting: 26.8  Smokeless Tobacco Never    Goals Met:  Independence with exercise equipment Exercise tolerated well No report of concerns or symptoms today  Goals Unmet:  Not Applicable  Comments: Pt able to follow exercise prescription today without complaint.  Will continue to monitor for progression.    Dr. Bethann Punches is Medical Director for [facility] Cardiac Rehabilitation.  Dr. Vida Rigger is Medical Director for [facility] Pulmonary Rehabilitation.

## 2023-09-14 ENCOUNTER — Encounter: Payer: Medicare Other | Admitting: *Deleted

## 2023-09-14 VITALS — Ht 67.52 in | Wt 162.0 lb

## 2023-09-14 DIAGNOSIS — I2089 Other forms of angina pectoris: Secondary | ICD-10-CM | POA: Diagnosis not present

## 2023-09-14 NOTE — Progress Notes (Signed)
Daily Session Note  Patient Details  Name: Timothy Garrison MRN: [id number] Date of Birth: 12-Oct-1960 Referring Provider:   Flowsheet Row Cardiac Rehab from 05/20/2023 in [facility] Cardiac and Pulmonary Rehab  Referring Provider Vevelyn Francois, MD       Encounter Date: 09/14/2023  Check In:  Session Check In - 09/14/23 0928       Check-In   Supervising physician immediately available to respond to emergencies See telemetry face sheet for immediately available ER MD    Location [facility]Cardiac & Pulmonary Rehab    Staff Present Rory Percy, MS, Exercise Physiologist;Margree Gimbel, RN, BSN, CCRP;Maxon Conetta BS, Exercise Physiologist;Noah Tickle, BS, Exercise Physiologist    Virtual Visit No    Medication changes reported     No    Fall or balance concerns reported    No    Warm-up and Cool-down Performed on first and last piece of equipment    Resistance Training Performed Yes    VAD Patient? No    PAD/SET Patient? No      Pain Assessment   Currently in Pain? No/denies              6 Minute Walk     Row Name 05/20/23 1452 09/14/23 0934       6 Minute Walk   Phase Initial Discharge    Distance 1330 feet 1795 feet    Distance % Change -- 34.96 %    Distance Feet Change -- 465 ft    Walk Time 6 minutes 6 minutes    # of Rest Breaks 0 0    MPH 2.52 3.4    METS 3.9 5.44    RPE 12 15    Perceived Dyspnea  0 1    VO2 Peak 13.64 19.05    Symptoms Yes (comment) Yes (comment)    Comments 4/10 Leg pain 6/10 leg pain    Resting HR 85 bpm 101 bpm    Resting BP 146/86 170/88    Resting Oxygen Saturation  97 % 97 %    Exercise Oxygen Saturation  during 6 min walk 99 % 98 %    Max Ex. HR 107 bpm 139 bpm    Max Ex. BP 170/92 200/90    2 Minute Post BP 158/92 158/84                Social History   Tobacco Use  Smoking Status Former   Current packs/day: 0.00   Types: Cigarettes   Quit date: 11/11/1996   Years since quitting: 26.8  Smokeless Tobacco Never     Goals Met:  Independence with exercise equipment Exercise tolerated well No report of concerns or symptoms today  Goals Unmet:  Not Applicable  Comments: Pt able to follow exercise prescription today without complaint.  Will continue to monitor for progression.    Dr. Bethann Punches is Medical Director for [facility] Cardiac Rehabilitation.  Dr. Vida Rigger is Medical Director for [facility] Pulmonary Rehabilitation.

## 2023-09-14 NOTE — Patient Instructions (Signed)
Discharge Patient Instructions  Patient Details  Name: Timothy Garrison MRN: [id number] Date of Birth: 11-24-1960 Referring Provider:  Dale Mooreland, MD   Number of Visits: 74  Reason for Discharge:  Patient reached a stable level of exercise. Patient independent in their exercise. Patient has met program and personal goals.  Smoking History:  Social History   Tobacco Use  Smoking Status Former   Current packs/day: 0.00   Types: Cigarettes   Quit date: 11/11/1996   Years since quitting: 26.8  Smokeless Tobacco Never    Diagnosis:  Chronic stable angina [facility])  Initial Exercise Prescription:  Initial Exercise Prescription - 05/20/23 1400       Date of Initial Exercise RX and Referring Provider   Date 05/20/23    Referring Provider Vevelyn Francois, MD      Oxygen   Maintain Oxygen Saturation 88% or higher      Treadmill   MPH 2.5    Grade 0    Minutes 15    METs 2.91      Recumbant Bike   Level 3    RPM 50    Watts 45    Minutes 15    METs 3.9      NuStep   Level 3    SPM 80    Minutes 15    METs 3.9      Elliptical   Level 2    Speed 3    Minutes 15    METs 3.9      Prescription Details   Frequency (times per week) 2    Duration Progress to 30 minutes of continuous aerobic without signs/symptoms of physical distress      Intensity   THRR 40-80% of Max Heartrate 114-143    Ratings of Perceived Exertion 11-13    Perceived Dyspnea 0-4      Progression   Progression Continue to progress workloads to maintain intensity without signs/symptoms of physical distress.      Resistance Training   Training Prescription Yes    Weight 5 lb    Reps 10-15             Discharge Exercise Prescription (Final Exercise Prescription Changes):  Exercise Prescription Changes - 09/09/23 1100       Response to Exercise   Blood Pressure (Admit) 150/80    Blood Pressure (Exit) 140/62    Heart Rate (Admit) 75 bpm    Heart Rate (Exercise) 136 bpm     Heart Rate (Exit) 122 bpm    Rating of Perceived Exertion (Exercise) 14    Symptoms none    Duration Continue with 30 min of aerobic exercise without signs/symptoms of physical distress.    Intensity THRR unchanged      Progression   Progression Continue to progress workloads to maintain intensity without signs/symptoms of physical distress.    Average METs 4.23      Resistance Training   Training Prescription Yes    Weight 6 lb    Reps 10-15      Interval Training   Interval Training No      Recumbant Bike   Level 5    Watts 44    Minutes 15    METs 3.86      NuStep   Level 5    Minutes 15    METs 4.6      Home Exercise Plan   Plans to continue exercise at Home (comment)   Plans to use his at  home recumbent bike for at least 30 minutes, 3 times a week.   Frequency Add 3 additional days to program exercise sessions.    Initial Home Exercises Provided 07/20/23      Oxygen   Maintain Oxygen Saturation 88% or higher             Functional Capacity:  6 Minute Walk     Row Name 05/20/23 1452 09/14/23 0934       6 Minute Walk   Phase Initial Discharge    Distance 1330 feet 1795 feet    Distance % Change -- 34.96 %    Distance Feet Change -- 465 ft    Walk Time 6 minutes 6 minutes    # of Rest Breaks 0 0    MPH 2.52 3.4    METS 3.9 5.44    RPE 12 15    Perceived Dyspnea  0 1    VO2 Peak 13.64 19.05    Symptoms Yes (comment) Yes (comment)    Comments 4/10 Leg pain 6/10 leg pain    Resting HR 85 bpm 101 bpm    Resting BP 146/86 170/88    Resting Oxygen Saturation  97 % 97 %    Exercise Oxygen Saturation  during 6 min walk 99 % 98 %    Max Ex. HR 107 bpm 139 bpm    Max Ex. BP 170/92 200/90    2 Minute Post BP 158/92 158/84            Nutrition & Weight - Outcomes:  Pre Biometrics - 05/20/23 1457       Pre Biometrics   Height 5' 7.5" (1.715 m)    Weight 164 lb 6.4 oz (74.6 kg)    Waist Circumference 36.6 inches    Hip Circumference 38 inches     Waist to Hip Ratio 0.96 %    BMI (Calculated) 25.35    Single Leg Stand 21 seconds             Post Biometrics - 09/14/23 0940        Post  Biometrics   Height 5' 7.52" (1.715 m)    Weight 162 lb (73.5 kg)    Waist Circumference 36.5 inches    Hip Circumference 38.5 inches    Waist to Hip Ratio 0.95 %    BMI (Calculated) 24.98    Single Leg Stand 18.6 seconds             Nutrition:  Nutrition Therapy & Goals - 05/20/23 1206       Nutrition Therapy   Diet Cardiac, low na    Protein (specify units) 90    Fiber 30 grams    Whole Grain Foods 3 servings    Saturated Fats 15 max. grams    Fruits and Vegetables 5 servings/day    Sodium 2 grams      Personal Nutrition Goals   Nutrition Goal Choose complex carbs and pair with healthy protein    Personal Goal #2 Include colorful produce at large meals or when eating out    Personal Goal #3 Make better snack choices    Comments Patient drinking mostly water. Sweet tea a few times a week, 6pack of beer per week. He is happy with the reductions he has made before coming to Rehab in these areas. Commended him on these changes and encouraged him to continue to work on them. He reports he eats 3 meals per day, wife cooks most  of his food. He reports she is health conscious and has been helping him to reduce sodium, saturated fat, and sugary foods. Reviewed mediterranean diet handout, educated on types of fats, sources, and how to read a label. Reminded him to watch his sodium intake and choose low sodium varieties of foods he likes. He asked about iron as well. Spoke with him about using iron fortified foods like cereal, using Vitamin C to help absorption and avoiding competing nutrients like calcium at the same times. Built out several meals and snacks with foods he likes with focus on including more colorful produce and complex carbs paired with healthy protein.      Intervention Plan   Intervention Prescribe, educate and counsel  regarding individualized specific dietary modifications aiming towards targeted core components such as weight, hypertension, lipid management, diabetes, heart failure and other comorbidities.;Nutrition handout(s) given to patient.    Expected Outcomes Short Term Goal: Understand basic principles of dietary content, such as calories, fat, sodium, cholesterol and nutrients.;Short Term Goal: A plan has been developed with personal nutrition goals set during dietitian appointment.;Long Term Goal: Adherence to prescribed nutrition plan.

## 2023-09-16 ENCOUNTER — Encounter: Payer: Medicare Other | Admitting: *Deleted

## 2023-09-16 DIAGNOSIS — I2089 Other forms of angina pectoris: Secondary | ICD-10-CM | POA: Diagnosis not present

## 2023-09-16 NOTE — Progress Notes (Signed)
Daily Session Note  Patient Details  Name: Timothy Garrison CURRENT MRN: [id number] Date of Birth: January 23, 1961 Referring Provider:   Flowsheet Row Cardiac Rehab from 05/20/2023 in [facility] Cardiac and Pulmonary Rehab  Referring Provider Vevelyn Francois, MD       Encounter Date: 09/16/2023  Check In:  Session Check In - 09/16/23 0939       Check-In   Supervising physician immediately available to respond to emergencies See telemetry face sheet for immediately available ER MD    Location [facility]-Cardiac & Pulmonary Rehab    Staff Present Cora Collum, RN, BSN, CCRP;Maxon Conetta BS, Exercise Physiologist;Joseph Hood RCP,RRT,BSRT;Noah Tickle, [place], Exercise Physiologist    Virtual Visit No    Medication changes reported     No    Fall or balance concerns reported    No    Warm-up and Cool-down Performed on first and last piece of equipment    Resistance Training Performed Yes    VAD Patient? No    PAD/SET Patient? No      Pain Assessment   Currently in Pain? No/denies                Social History   Tobacco Use  Smoking Status Former   Current packs/day: 0.00   Types: Cigarettes   Quit date: 11/11/1996   Years since quitting: 26.8  Smokeless Tobacco Never    Goals Met:  Independence with exercise equipment Exercise tolerated well No report of concerns or symptoms today  Goals Unmet:  Not Applicable  Comments: Pt able to follow exercise prescription today without complaint.  Will continue to monitor for progression.    Dr. Bethann Punches is Medical Director for [facility] Cardiac Rehabilitation.  Dr. Vida Rigger is Medical Director for [facility] Pulmonary Rehabilitation.

## 2023-09-21 ENCOUNTER — Encounter: Payer: Medicare Other | Attending: Cardiology | Admitting: *Deleted

## 2023-09-21 DIAGNOSIS — I2089 Other forms of angina pectoris: Secondary | ICD-10-CM | POA: Diagnosis not present

## 2023-09-21 NOTE — Progress Notes (Signed)
Daily Session Note  Patient Details  Name: Timothy Garrison MRN: [id number] Date of Birth: 09/01/1960 Referring Provider:   Flowsheet Row Cardiac Rehab from 05/20/2023 in [facility] Cardiac and Pulmonary Rehab  Referring Provider Vevelyn Francois, MD       Encounter Date: 09/21/2023  Check In:  Session Check In - 09/21/23 0955       Check-In   Supervising physician immediately available to respond to emergencies See telemetry face sheet for immediately available ER MD    Location [facility]-Cardiac & Pulmonary Rehab    Staff Present Cora Collum, RN, BSN, CCRP;Margaret Best, MS, Exercise Physiologist;Maxon Conetta BS, Exercise Physiologist;Noah Tickle, BS, Exercise Physiologist    Virtual Visit No    Medication changes reported     No    Fall or balance concerns reported    No    Warm-up and Cool-down Performed on first and last piece of equipment    Resistance Training Performed Yes    VAD Patient? No    PAD/SET Patient? No      Pain Assessment   Currently in Pain? No/denies                Social History   Tobacco Use  Smoking Status Former   Current packs/day: 0.00   Types: Cigarettes   Quit date: 11/11/1996   Years since quitting: 26.8  Smokeless Tobacco Never    Goals Met:  Independence with exercise equipment Exercise tolerated well No report of concerns or symptoms today  Goals Unmet:  Not Applicable  Comments: Pt able to follow exercise prescription today without complaint.  Will continue to monitor for progression.    Dr. Bethann Punches is Medical Director for [facility] Cardiac Rehabilitation.  Dr. Vida Rigger is Medical Director for [facility] Pulmonary Rehabilitation.

## 2023-09-23 ENCOUNTER — Encounter: Payer: Medicare Other | Admitting: *Deleted

## 2023-09-23 DIAGNOSIS — I2089 Other forms of angina pectoris: Secondary | ICD-10-CM | POA: Diagnosis not present

## 2023-09-23 NOTE — Progress Notes (Signed)
 Daily Session Note  Patient Details  Name: Timothy Garrison MRN: [id number] Date of Birth: [birth year] Referring Provider:   Flowsheet Row Cardiac Rehab from 05/20/2023 in [facility] Cardiac and Pulmonary Rehab  Referring Provider Marinell How, MD       Encounter Date: 09/23/2023  Check In:  Session Check In - 09/23/23 9062       Check-In   Supervising physician immediately available to respond to emergencies See telemetry face sheet for immediately available ER MD    Location [facility]-Cardiac & Pulmonary Rehab    Staff Present Othel Durand, RN, BSN, CCRP;Noah Tickle, BS, Exercise Physiologist;Joseph Hood RCP,RRT,BSRT;Maxon Montier BS, Exercise Physiologist    Virtual Visit No    Medication changes reported     No    Fall or balance concerns reported    No    Resistance Training Performed Yes    VAD Patient? No    PAD/SET Patient? No      Pain Assessment   Currently in Pain? No/denies                Social History   Tobacco Use  Smoking Status Former   Current packs/day: 0.00   Types: Cigarettes   Quit date: 11/11/1996   Years since quitting: 26.8  Smokeless Tobacco Never    Goals Met:  Independence with exercise equipment Exercise tolerated well No report of concerns or symptoms today  Goals Unmet:  Not Applicable  Comments: Pt able to follow exercise prescription today without complaint.  Will continue to monitor for progression.    Dr. Oneil Pinal is Medical Director for [facility] Cardiac Rehabilitation.  Dr. Fuad Aleskerov is Medical Director for [facility] Pulmonary Rehabilitation.

## 2023-09-28 ENCOUNTER — Encounter: Payer: Medicare Other | Admitting: *Deleted

## 2023-09-28 DIAGNOSIS — I2089 Other forms of angina pectoris: Secondary | ICD-10-CM | POA: Diagnosis not present

## 2023-09-28 NOTE — Progress Notes (Signed)
Daily Session Note  Patient Details  Name: KELIJAH TOWRY MRN: [id number] Date of Birth: [birth year] Referring Provider:   Flowsheet Row Cardiac Rehab from 05/20/2023 in [facility] Cardiac and Pulmonary Rehab  Referring Provider Vevelyn Francois, MD       Encounter Date: 09/28/2023  Check In:  Session Check In - 09/28/23 0925       Check-In   Supervising physician immediately available to respond to emergencies See telemetry face sheet for immediately available ER MD    Location [facility]-Cardiac & Pulmonary Rehab    Staff Present Cora Collum, RN, BSN, CCRP;Margaret Best, MS, Exercise Physiologist;Maxon Conetta BS, Exercise Physiologist;Noah Tickle, BS, Exercise Physiologist    Virtual Visit No    Medication changes reported     No    Fall or balance concerns reported    No    Warm-up and Cool-down Performed on first and last piece of equipment    Resistance Training Performed Yes    VAD Patient? No    PAD/SET Patient? No      Pain Assessment   Currently in Pain? No/denies                Social History   Tobacco Use  Smoking Status Former   Current packs/day: 0.00   Types: Cigarettes   Quit date: 11/11/1996   Years since quitting: 26.8  Smokeless Tobacco Never    Goals Met:  Independence with exercise equipment Exercise tolerated well No report of concerns or symptoms today  Goals Unmet:  Not Applicable  Comments: Pt able to follow exercise prescription today without complaint.  Will continue to monitor for progression.    Dr. Bethann Punches is Medical Director for [facility] Cardiac Rehabilitation.  Dr. Vida Rigger is Medical Director for [facility] Pulmonary Rehabilitation.

## 2023-09-29 DIAGNOSIS — I2089 Other forms of angina pectoris: Secondary | ICD-10-CM

## 2023-09-29 NOTE — Progress Notes (Signed)
Cardiac Individual Treatment Plan  Patient Details  Name: Timothy Garrison MRN: [id number] Date of Birth: [birth year] Referring Provider:   Flowsheet Row Cardiac Rehab from 05/20/2023 in [facility] Cardiac and Pulmonary Rehab  Referring Provider Vevelyn Francois, MD       Initial Encounter Date:  Flowsheet Row Cardiac Rehab from 05/20/2023 in [facility] Cardiac and Pulmonary Rehab  Date 05/20/23       Visit Diagnosis: Chronic stable angina (HCC)  Patient's Home Medications on Admission:  Current Outpatient Medications:    apixaban (ELIQUIS) 5 MG TABS tablet, Take 1 tablet (5 mg total) by mouth every 12 (twelve) hours., Disp: 180 tablet, Rfl: 1   aspirin EC 81 MG tablet, Take 81 mg by mouth daily. Swallow whole., Disp: , Rfl:    busPIRone (BUSPAR) 5 MG tablet, Take 1 tablet (5 mg total) by mouth daily as needed., Disp: 30 tablet, Rfl: 1   lisinopril (ZESTRIL) 40 MG tablet, Take 1 tablet (40 mg total) by mouth daily., Disp: 90 tablet, Rfl: 3   Multiple Vitamin (MULTIVITAMIN) capsule, Take by mouth., Disp: , Rfl:    nitroGLYCERIN (NITROSTAT) 0.4 MG SL tablet, Place 0.4 mg under the tongue every 5 (five) minutes as needed for chest pain., Disp: , Rfl:    omeprazole (PRILOSEC) 20 MG capsule, Take 1 capsule (20 mg total) by mouth 2 (two) times daily., Disp: 180 capsule, Rfl: 3   pravastatin (PRAVACHOL) 40 MG tablet, Take 1 tablet by mouth daily., Disp: , Rfl:    REPATHA SURECLICK 140 MG/ML SOAJ, INJECT 140 MG INTO THE SKIN EVERY 14 DAYS, Disp: 2 mL, Rfl: 2  Past Medical History: Past Medical History:  Diagnosis Date   Basal cell carcinoma (BCC) of back    Basal cell carcinoma (BCC) of nasal sidewall    Dizziness    GERD (gastroesophageal reflux disease)    H/O blood clots    Headache    migraines   Hypercholesterolemia    Hypertension    hx of HBP readings   Metastatic squamous cell carcinoma 2018   bx cervical lymph node   Seizures (HCC) 2021   Tonsillar cancer (HCC)    Vascular  abnormality    VTE (venous thromboembolism) 2018   associated with chemotherapy    Tobacco Use: Social History   Tobacco Use  Smoking Status Former   Current packs/day: 0.00   Types: Cigarettes   Quit date: 11/11/1996   Years since quitting: 26.8  Smokeless Tobacco Never    Labs: Review Flowsheet  More data exists      Latest Ref Rng & Units 03/04/2022 06/05/2022 09/08/2022 01/07/2023 08/06/2023  Labs for ITP Cardiac and Pulmonary Rehab  Cholestrol 0 - 200 mg/dL 563  875  643  329  518   LDL (calc) 0 - 99 mg/dL 841  660  - 32  46   Direct LDL mg/dL - - 630.1  - -  HDL-C >60.10 mg/dL 93.23  55.73  22.02  54.27  45.20   Trlycerides 0.0 - 149.0 mg/dL 062.3  762.8  315.1  761.6  114.0      Exercise Target Goals: Exercise Program Goal: Individual exercise prescription set using results from initial 6 min walk test and THRR while considering  patient's activity barriers and safety.   Exercise Prescription Goal: Initial exercise prescription builds to 30-45 minutes a day of aerobic activity, 2-3 days per week.  Home exercise guidelines will be given to patient during program as part of exercise prescription that  the participant will acknowledge.   Education: Aerobic Exercise: - Group verbal and visual presentation on the components of exercise prescription. Introduces F.I.T.T principle from ACSM for exercise prescriptions.  Reviews F.I.T.T. principles of aerobic exercise including progression. Written material given at graduation. Flowsheet Row Cardiac Rehab from 05/27/2023 in [facility] Cardiac and Pulmonary Rehab  Education need identified 05/20/23       Education: Resistance Exercise: - Group verbal and visual presentation on the components of exercise prescription. Introduces F.I.T.T principle from ACSM for exercise prescriptions  Reviews F.I.T.T. principles of resistance exercise including progression. Written material given at graduation.    Education: Exercise & Equipment  Safety: - Individual verbal instruction and demonstration of equipment use and safety with use of the equipment. Flowsheet Row Cardiac Rehab from 05/27/2023 in [facility] Cardiac and Pulmonary Rehab  Date 05/20/23  Educator MB  Instruction Review Code 1- Verbalizes Understanding       Education: Exercise Physiology & General Exercise Guidelines: - Group verbal and written instruction with models to review the exercise physiology of the cardiovascular system and associated critical values. Provides general exercise guidelines with specific guidelines to those with heart or lung disease.    Education: Flexibility, Balance, Mind/Body Relaxation: - Group verbal and visual presentation with interactive activity on the components of exercise prescription. Introduces F.I.T.T principle from ACSM for exercise prescriptions. Reviews F.I.T.T. principles of flexibility and balance exercise training including progression. Also discusses the mind body connection.  Reviews various relaxation techniques to help reduce and manage stress (i.e. Deep breathing, progressive muscle relaxation, and visualization). Balance handout provided to take home. Written material given at graduation.   Activity Barriers & Risk Stratification:  Activity Barriers & Cardiac Risk Stratification - 05/20/23 1453       Activity Barriers & Cardiac Risk Stratification   Activity Barriers Other (comment);Neck/Spine Problems    Comments leg pain with walking    Cardiac Risk Stratification High             6 Minute Walk:  6 Minute Walk     Row Name 05/20/23 1452 09/14/23 0934       6 Minute Walk   Phase Initial Discharge    Distance 1330 feet 1795 feet    Distance % Change -- 34.96 %    Distance Feet Change -- 465 ft    Walk Time 6 minutes 6 minutes    # of Rest Breaks 0 0    MPH 2.52 3.4    METS 3.9 5.44    RPE 12 15    Perceived Dyspnea  0 1    VO2 Peak 13.64 19.05    Symptoms Yes (comment) Yes (comment)     Comments 4/10 Leg pain 6/10 leg pain    Resting HR 85 bpm 101 bpm    Resting BP 146/86 170/88    Resting Oxygen Saturation  97 % 97 %    Exercise Oxygen Saturation  during 6 min walk 99 % 98 %    Max Ex. HR 107 bpm 139 bpm    Max Ex. BP 170/92 200/90    2 Minute Post BP 158/92 158/84             Oxygen Initial Assessment:   Oxygen Re-Evaluation:   Oxygen Discharge (Final Oxygen Re-Evaluation):   Initial Exercise Prescription:  Initial Exercise Prescription - 05/20/23 1400       Date of Initial Exercise RX and Referring Provider   Date 05/20/23    Referring Provider Ree Kida  Kuritzky, MD      Oxygen   Maintain Oxygen Saturation 88% or higher      Treadmill   MPH 2.5    Grade 0    Minutes 15    METs 2.91      Recumbant Bike   Level 3    RPM 50    Watts 45    Minutes 15    METs 3.9      NuStep   Level 3    SPM 80    Minutes 15    METs 3.9      Elliptical   Level 2    Speed 3    Minutes 15    METs 3.9      Prescription Details   Frequency (times per week) 2    Duration Progress to 30 minutes of continuous aerobic without signs/symptoms of physical distress      Intensity   THRR 40-80% of Max Heartrate 114-143    Ratings of Perceived Exertion 11-13    Perceived Dyspnea 0-4      Progression   Progression Continue to progress workloads to maintain intensity without signs/symptoms of physical distress.      Resistance Training   Training Prescription Yes    Weight 5 lb    Reps 10-15             Perform Capillary Blood Glucose checks as needed.  Exercise Prescription Changes:   Exercise Prescription Changes     Row Name 05/20/23 1400 06/04/23 0800 06/16/23 1100 07/13/23 0800 07/20/23 1000     Response to Exercise   Blood Pressure (Admit) 146/86 122/70 136/70 144/72 --   Blood Pressure (Exercise) 170/92 164/86 158/82 -- --   Blood Pressure (Exit) 146/82 124/78 104/62 110/70 --   Heart Rate (Admit) 85 bpm 93 bpm 92 bpm 116 bpm --    Heart Rate (Exercise) 107 bpm 132 bpm 145 bpm 145 bpm --   Heart Rate (Exit) 72 bpm 111 bpm 109 bpm 115 bpm --   Oxygen Saturation (Admit) 97 % -- -- -- --   Oxygen Saturation (Exercise) 99 % -- -- -- --   Oxygen Saturation (Exit) 99 % -- -- -- --   Rating of Perceived Exertion (Exercise) 12 13 14 14  --   Perceived Dyspnea (Exercise) 0 -- -- -- --   Symptoms 4/10 leg pain none none none --   Comments results First two days of exercise -- -- --   Duration Progress to 30 minutes of  aerobic without signs/symptoms of physical distress Progress to 30 minutes of  aerobic without signs/symptoms of physical distress Continue with 30 min of aerobic exercise without signs/symptoms of physical distress. Continue with 30 min of aerobic exercise without signs/symptoms of physical distress. --   Intensity THRR New THRR unchanged THRR unchanged THRR unchanged --     Progression   Progression Continue to progress workloads to maintain intensity without signs/symptoms of physical distress. Continue to progress workloads to maintain intensity without signs/symptoms of physical distress. Continue to progress workloads to maintain intensity without signs/symptoms of physical distress. Continue to progress workloads to maintain intensity without signs/symptoms of physical distress. --   Average METs 3.9 3.05 3.24 3.6 --     Resistance Training   Training Prescription -- Yes Yes Yes --   Weight -- 5 lb 5 lb 5 lb --   Reps -- 10-15 10-15 10-15 --     Interval Training   Interval Training --  No No No --     Treadmill   MPH -- 2.2 2.6 2.6 --   Grade -- 0 0 0 --   Minutes -- 30 15 15  --   METs -- 2.69 2.99 2.99 --     Recumbant Bike   Level -- 5 7 5  --   Watts -- 37 50 46 --   Minutes -- 15 15 15  --   METs -- 3.57 4.12 3.95 --     NuStep   Level -- 3 4  T6: 3 5 --   Minutes -- 15 15 15  --   METs -- 3.1 3.5  T6: 2.8 4 --     T5 Nustep   Level -- -- -- 2 --   Minutes -- -- -- 15 --   METs -- --  -- 2.6 --     Home Exercise Plan   Plans to continue exercise at -- -- -- -- Home (comment)  Plans to use his at home recumbent bike for at least 30 minutes, 3 times a week.   Frequency -- -- -- -- Add 3 additional days to program exercise sessions.   Initial Home Exercises Provided -- -- -- -- 07/20/23     Oxygen   Maintain Oxygen Saturation -- 88% or higher 88% or higher 88% or higher --    Row Name 07/29/23 1500 08/12/23 0900 08/26/23 1600 09/09/23 1100 09/23/23 1400     Response to Exercise   Blood Pressure (Admit) 142/80 122/70 142/80 150/80 158/78   Blood Pressure (Exit) 122/62 142/68 120/84 140/62 138/76   Heart Rate (Admit) 114 bpm 113 bpm 124 bpm 75 bpm 110 bpm   Heart Rate (Exercise) 140 bpm 145 bpm 147 bpm 136 bpm 136 bpm   Heart Rate (Exit) 105 bpm 108 bpm 124 bpm 122 bpm 127 bpm   Rating of Perceived Exertion (Exercise) 13 13 13 14 15    Symptoms none none -- none none   Duration Continue with 30 min of aerobic exercise without signs/symptoms of physical distress. Continue with 30 min of aerobic exercise without signs/symptoms of physical distress. Continue with 30 min of aerobic exercise without signs/symptoms of physical distress. Continue with 30 min of aerobic exercise without signs/symptoms of physical distress. Continue with 30 min of aerobic exercise without signs/symptoms of physical distress.   Intensity THRR unchanged THRR unchanged THRR unchanged THRR unchanged THRR unchanged     Progression   Progression Continue to progress workloads to maintain intensity without signs/symptoms of physical distress. Continue to progress workloads to maintain intensity without signs/symptoms of physical distress. Continue to progress workloads to maintain intensity without signs/symptoms of physical distress. Continue to progress workloads to maintain intensity without signs/symptoms of physical distress. Continue to progress workloads to maintain intensity without signs/symptoms of  physical distress.   Average METs 3.16 3.46 3.56 4.23 3.34     Resistance Training   Training Prescription Yes Yes Yes Yes Yes   Weight 5 lb 5 lb 6 lb 6 lb 6 lb   Reps 10-15 10-15 10-15 10-15 10-15     Interval Training   Interval Training No No No No No     Treadmill   MPH 2.6 2.7 2.7 -- 2.8   Grade 0 0 0 -- 0   Minutes 15 15 15  -- 15   METs 2.99 3.07 3.07 -- 3.14     Recumbant Bike   Level 5 5 6 5 6    Watts 46 46 40 44  44   Minutes 15 15 15 15 15    METs 3.95 3.95 3.7 3.86 3.88     NuStep   Level 5  T6: level 3 5 5 5 5    Minutes 15 15 15 15 15    METs 3.8  T6: 2.4 METs 4.9 4.9 4.6 4.4     T5 Nustep   Level -- 3  T6 3 -- 4   Minutes -- 15 15 -- 15   METs -- 2.7 2.5 -- 3.1     Home Exercise Plan   Plans to continue exercise at Home (comment)  Plans to use his at home recumbent bike for at least 30 minutes, 3 times a week. Home (comment)  Plans to use his at home recumbent bike for at least 30 minutes, 3 times a week. Home (comment)  Plans to use his at home recumbent bike for at least 30 minutes, 3 times a week. Home (comment)  Plans to use his at home recumbent bike for at least 30 minutes, 3 times a week. Home (comment)  Plans to use his at home recumbent bike for at least 30 minutes, 3 times a week.   Frequency Add 3 additional days to program exercise sessions. Add 3 additional days to program exercise sessions. Add 3 additional days to program exercise sessions. Add 3 additional days to program exercise sessions. Add 3 additional days to program exercise sessions.   Initial Home Exercises Provided 07/20/23 07/20/23 07/20/23 07/20/23 07/20/23     Oxygen   Maintain Oxygen Saturation 88% or higher 88% or higher 88% or higher 88% or higher 88% or higher            Exercise Comments:   Exercise Comments     Row Name 05/25/23 0953           Exercise Comments First full day of exercise!  Patient was oriented to gym and equipment including functions, settings,  policies, and procedures.  Patient's individual exercise prescription and treatment plan were reviewed.  All starting workloads were established based on the results of the 6 minute walk test done at initial orientation visit.  The plan for exercise progression was also introduced and progression will be customized based on patient's performance and goals.                Exercise Goals and Review:   Exercise Goals     Row Name 05/20/23 1456             Exercise Goals   Increase Physical Activity Yes       Intervention Provide advice, education, support and counseling about physical activity/exercise needs.;Develop an individualized exercise prescription for aerobic and resistive training based on initial evaluation findings, risk stratification, comorbidities and participant's personal goals.       Expected Outcomes Short Term: Attend rehab on a regular basis to increase amount of physical activity.;Long Term: Exercising regularly at least 3-5 days a week.;Long Term: Add in home exercise to make exercise part of routine and to increase amount of physical activity.       Increase Strength and Stamina Yes       Intervention Provide advice, education, support and counseling about physical activity/exercise needs.;Develop an individualized exercise prescription for aerobic and resistive training based on initial evaluation findings, risk stratification, comorbidities and participant's personal goals.       Expected Outcomes Short Term: Increase workloads from initial exercise prescription for resistance, speed, and METs.;Short Term: Perform resistance training exercises  routinely during rehab and add in resistance training at home;Long Term: Improve cardiorespiratory fitness, muscular endurance and strength as measured by increased METs and functional capacity ( )       Able to understand and use rate of perceived exertion (RPE) scale Yes       Intervention Provide education and explanation  on how to use RPE scale       Expected Outcomes Short Term: Able to use RPE daily in rehab to express subjective intensity level;Long Term:  Able to use RPE to guide intensity level when exercising independently       Able to understand and use Dyspnea scale Yes       Intervention Provide education and explanation on how to use Dyspnea scale       Expected Outcomes Short Term: Able to use Dyspnea scale daily in rehab to express subjective sense of shortness of breath during exertion;Long Term: Able to use Dyspnea scale to guide intensity level when exercising independently       Knowledge and understanding of Target Heart Rate Range (THRR) Yes       Intervention Provide education and explanation of THRR including how the numbers were predicted and where they are located for reference       Expected Outcomes Short Term: Able to state/look up THRR;Long Term: Able to use THRR to govern intensity when exercising independently;Short Term: Able to use daily as guideline for intensity in rehab       Able to check pulse independently Yes       Intervention Provide education and demonstration on how to check pulse in carotid and radial arteries.;Review the importance of being able to check your own pulse for safety during independent exercise       Expected Outcomes Short Term: Able to explain why pulse checking is important during independent exercise;Long Term: Able to check pulse independently and accurately       Understanding of Exercise Prescription Yes       Intervention Provide education, explanation, and written materials on patient's individual exercise prescription       Expected Outcomes Short Term: Able to explain program exercise prescription;Long Term: Able to explain home exercise prescription to exercise independently                Exercise Goals Re-Evaluation :  Exercise Goals Re-Evaluation     Row Name 05/25/23 0953 06/04/23 0803 06/16/23 1137 07/13/23 0846 07/20/23 1003      Exercise Goal Re-Evaluation   Exercise Goals Review Able to understand and use rate of perceived exertion (RPE) scale;Knowledge and understanding of Target Heart Rate Range (THRR);Able to understand and use Dyspnea scale;Understanding of Exercise Prescription Increase Physical Activity;Understanding of Exercise Prescription;Increase Strength and Stamina Increase Physical Activity;Understanding of Exercise Prescription;Increase Strength and Stamina Increase Physical Activity;Understanding of Exercise Prescription;Increase Strength and Stamina Understanding of Exercise Prescription;Able to understand and use Dyspnea scale;Increase Physical Activity;Knowledge and understanding of Target Heart Rate Range (THRR);Increase Strength and Stamina;Able to check pulse independently;Able to understand and use rate of perceived exertion (RPE) scale   Comments Reviewed RPE and dyspnea scale, THR and program prescription with pt today.  Pt voiced understanding and was given a copy of goals to take home. Lindel is off to a good start in the program. He was able to walk the treadmill at a speed of 2.2 mph with no incline. He also worked at level 3 on the T4 nustep and level 5 on the recumbent bike. He was able  to tolerate 5 lb hand weights for resistance training as well. We will continue to monitor his progress in the program. Imari is doing well in the program. He recently increased his workload on the treadmill by increasing his speed to 2.6 mph with no incline. He also improved to level 4 on the T4 nustep and level 7 on the recumbent bike. He continues to do well with 5 lb hand weights for resistance training as well. We will continue to monitor his progress in the program. Jemal continues to do well in the program. He continues to do well on the treadmill at 2.6 mph with no incline. He also improved to level 5 on the T4 nustep and level 2 on the T5 nustep  as well. We will continue to monitor his progress in the program.  Reviewed home exercise with pt today.  Pt plans to use his at home recumbent bike for at least 30 minutes, 3 times a week for exercise.  Reviewed THR, pulse, RPE, sign and symptoms, pulse oximetery and when to call 911 or MD.  Also discussed weather considerations and indoor options.  Pt voiced understanding.   Expected Outcomes Short: Use RPE daily to regulate intensity. Long: Follow program prescription in THR. Short: Continue to follow initial exercise prescription. Long: Continue exercise to improve strength and stamina. Short: Continue to progressively increase treadmill workload by adding incline. Long: Continue exercise to improve strength and stamina. Short: Continue to progressively increase treadmill workload by adding incline. Long: Continue exercise to improve strength and stamina. Short: Continue to exercise at home as well as in rehab. Long: Continue to exercise to improve strength and stamina.    Row Name 07/29/23 1553 08/12/23 0919 08/12/23 0954 08/26/23 1626 09/09/23 1112     Exercise Goal Re-Evaluation   Exercise Goals Review Increase Physical Activity;Increase Strength and Stamina;Understanding of Exercise Prescription Increase Physical Activity;Increase Strength and Stamina;Understanding of Exercise Prescription Increase Physical Activity;Increase Strength and Stamina;Understanding of Exercise Prescription Increase Physical Activity;Increase Strength and Stamina;Understanding of Exercise Prescription Increase Physical Activity;Increase Strength and Stamina;Understanding of Exercise Prescription   Comments Jens continues to do well in rehab. He has stayed consistent on the treadmill at a speed of 2.6 mph with no incline. He also continues to work at level 3 on the T6 nustep, level 5 on the T4 nustep, and level 5 on the recumbent bike. We will continue to monitor his progress in the program. He is working out on recumbent bike at home, 3-4 times per week ontop of coming to rehab 2 times  per week. Currently on treadmill 2. here at rehab. Spoke with him about future goals of working out independently at home on days he would have come to rehab once rehab is over. He reports he understands to importnace of continuing to workout at home Vinton continues to make improvements in rehab. He was able to increase his level on the T6 nustep from level 2 to 3. He was also able to increase his speed on the treadmill from 2. to 2.44mph. We will continue to monitor his progress in the program. Rashaan continues to do well in rehab. He has consistently walked the treadmill at a speed of 2.7 mph with no incline. He also improved to level 6 on the recumbent bike and increased to 6 lb hand weights for resistance training. We will continue to monitor his progress in the program. Lavonne attended 1 session since the last review. He used the T4 on level 5 and  the recumbent bike on level 5. He has maintained using the 6lb hand weights for resistance training. He is getting ready to do his post soon. We will continue to monitor his progress in the program.   Expected Outcomes Short: Add incline to treadmill workload. Long: Continue to exercise to improve strength and stamina. STG: add inclide to treadmill as able. LTG: Continue to exercise at home to improve strength and stamina Short: Continue to progressively increase treadmill workloads. Long: Continue exercise to improve strength and stamina. Short: Begin to progressively increase treadmill workload. Long: Continue exercise to improve strength and stamina. Short: Improve on post . Long: Continue to increase overall METs and stamina and graduate.    Row Name 09/14/23 0941 09/23/23 1500           Exercise Goal Re-Evaluation   Exercise Goals Review Increase Physical Activity;Understanding of Exercise Prescription;Increase Strength and Stamina;Able to understand and use rate of perceived exertion (RPE) scale;Knowledge and understanding of Target  Heart Rate Range (THRR) Increase Physical Activity;Increase Strength and Stamina;Understanding of Exercise Prescription      Comments Colbe is interested in getting a treadmill in addition to his recumbent bike at home for home aerobic exercise. We reviewed how to progess on the treadmill with his PAD symptoms and duration with both the bike and treadmill. We reviewed his THRR and RPE scale to use when exercising at home and determining his intensity level. Xaviar continues to do well in rehab. He recently completed his post and improved by 35%! He also increased his treadmill speed to 2.8 mph with no inclne. He improved to level 4 on the T5 nustep and level 6 on the recumbent bike as well. We will continue to monitor his progress in the program.      Expected Outcomes Short: Add in aerobic exercise on the treadmill at home to add variety with his recumbent bike. Long: Continue to exercise independently at home to improve strength and stamina. Short: Graduate. Long: Continue to exercise independently.               Discharge Exercise Prescription (Final Exercise Prescription Changes):  Exercise Prescription Changes - 09/23/23 1400       Response to Exercise   Blood Pressure (Admit) 158/78    Blood Pressure (Exit) 138/76    Heart Rate (Admit) 110 bpm    Heart Rate (Exercise) 136 bpm    Heart Rate (Exit) 127 bpm    Rating of Perceived Exertion (Exercise) 15    Symptoms none    Duration Continue with 30 min of aerobic exercise without signs/symptoms of physical distress.    Intensity THRR unchanged      Progression   Progression Continue to progress workloads to maintain intensity without signs/symptoms of physical distress.    Average METs 3.34      Resistance Training   Training Prescription Yes    Weight 6 lb    Reps 10-15      Interval Training   Interval Training No      Treadmill   MPH 2.8    Grade 0    Minutes 15    METs 3.14      Recumbant Bike   Level 6     Watts 44    Minutes 15    METs 3.88      NuStep   Level 5    Minutes 15    METs 4.4      T5 Nustep   Level  4    Minutes 15    METs 3.1      Home Exercise Plan   Plans to continue exercise at Home (comment)   Plans to use his at home recumbent bike for at least 30 minutes, 3 times a week.   Frequency Add 3 additional days to program exercise sessions.    Initial Home Exercises Provided 07/20/23      Oxygen   Maintain Oxygen Saturation 88% or higher             Nutrition:  Target Goals: Understanding of nutrition guidelines, daily intake of sodium 1500mg , cholesterol 200mg , calories 30% from fat and 7% or less from saturated fats, daily to have 5 or more servings of fruits and vegetables.  Education: All About Nutrition: -Group instruction provided by verbal, written material, interactive activities, discussions, models, and posters to present general guidelines for heart healthy nutrition including fat, fiber, MyPlate, the role of sodium in heart healthy nutrition, utilization of the nutrition label, and utilization of this knowledge for meal planning. Follow up email sent as well. Written material given at graduation. Flowsheet Row Cardiac Rehab from 05/27/2023 in [facility]  Education need identified 05/20/23       Biometrics:  Pre Biometrics - 05/20/23 1457       Pre Biometrics   Height 5' 7.5" (1.715 m)    Weight 164 lb 6.4 oz (74.6 kg)    Waist Circumference 36.6 inches    Hip Circumference 38 inches    Waist to Hip Ratio 0.96 %    BMI (Calculated) 25.35    Single Leg Stand 21 seconds             Post Biometrics - 09/14/23 0940        Post  Biometrics   Height 5' 7.52" (1.715 m)    Weight 162 lb (73.5 kg)    Waist Circumference 36.5 inches    Hip Circumference 38.5 inches    Waist to Hip Ratio 0.95 %    BMI (Calculated) 24.98    Single Leg Stand 18.6 seconds             Nutrition Therapy Plan and Nutrition Goals:   Nutrition Therapy & Goals - 05/20/23 1206       Nutrition Therapy   Diet Cardiac, low na    Protein (specify units) 90    Fiber 30 grams    Whole Grain Foods 3 servings    Saturated Fats 15 max. grams    Fruits and Vegetables 5 servings/day    Sodium 2 grams      Personal Nutrition Goals   Nutrition Goal Choose complex carbs and pair with healthy protein    Personal Goal #2 Include colorful produce at large meals or when eating out    Personal Goal #3 Make better snack choices    Comments Patient drinking mostly water. Sweet tea a few times a week, 6pack of beer per week. He is happy with the reductions he has made before coming to Rehab in these areas. Commended him on these changes and encouraged him to continue to work on them. He reports he eats 3 meals per day, wife cooks most of his food. He reports she is health conscious and has been helping him to reduce sodium, saturated fat, and sugary foods. Reviewed mediterranean diet handout, educated on types of fats, sources, and how to read a label. Reminded him to watch his sodium intake and choose  low sodium varieties of foods he likes. He asked about iron as well. Spoke with him about using iron fortified foods like cereal, using Vitamin C to help absorption and avoiding competing nutrients like calcium at the same times. Built out several meals and snacks with foods he likes with focus on including more colorful produce and complex carbs paired with healthy protein.      Intervention Plan   Intervention Prescribe, educate and counsel regarding individualized specific dietary modifications aiming towards targeted core components such as weight, hypertension, lipid management, diabetes, heart failure and other comorbidities.;Nutrition handout(s) given to patient.    Expected Outcomes Short Term Goal: Understand basic principles of dietary content, such as calories, fat, sodium, cholesterol and nutrients.;Short Term Goal: A plan has been  developed with personal nutrition goals set during dietitian appointment.;Long Term Goal: Adherence to prescribed nutrition plan.             Nutrition Assessments:  MEDIFICTS Score Key: >=70 Need to make dietary changes  40-70 Heart Healthy Diet <= 40 Therapeutic Level Cholesterol Diet  Flowsheet Row Cardiac Rehab from 09/16/2023 in [facility] Cardiac and Pulmonary Rehab  Picture Your Plate Total Score on Admission 67  Picture Your Plate Total Score on Discharge 70      Picture Your Plate Scores: <16 Unhealthy dietary pattern with much room for improvement. 41-50 Dietary pattern unlikely to meet recommendations for good health and room for improvement. 51-60 More healthful dietary pattern, with some room for improvement.  >60 Healthy dietary pattern, although there may be some specific behaviors that could be improved.    Nutrition Goals Re-Evaluation:  Nutrition Goals Re-Evaluation     Row Name 06/15/23 1043 07/20/23 0947 08/12/23 0923 09/14/23 0956       Goals   Comment Christin states that his wife has helped him with his diet. He is working on getting more protein primarily through chicken and has cut out red meats from his diet at this time. He is also working on drinking plenty of water throughout the day. Rockey states that he has been doing well with his eating habits. He continues to cut out red meat and get primarily chicken as his protein source. His wife has been a major help when it comes to his diet. He also continues to work on dietary changes discussed with RD. He feels he still working on nutrition goals of cutting back on saturated fat while keeping protein up. Spoke about plant based protein to help him meet protein goals and increase fiber intake. He is happy with his weight, encouraged nutrient dense foods rather than junk and to avoid sugary beverages. Viraat states that he is still cutting back on saturated fats. He states that he and his wife are working on  increasing protein without eating red meat due to his own research. He is avoiding soft drinks, he drinks the occasional tea, but is mostly drinking water.    Expected Outcome Short: Continue to work on protein intake. Long: Continue to work on heart healthy eating patterns discussed with RD. Short: Continue to work on protein intake. Long: Continue to work on heart healthy eating patterns discussed with RD. STG: COntinue to reduce saturated fat and keep protein intake up. LTG: maintain weight and follow heart healthy diet Short: Continue to increase protein intake and avoid soft drinks. Long: Continue heart healthy eating patterns as discussed with RD.             Nutrition Goals Discharge (Final Nutrition  Goals Re-Evaluation):  Nutrition Goals Re-Evaluation - 09/14/23 0956       Goals   Comment Diarra states that he is still cutting back on saturated fats. He states that he and his wife are working on increasing protein without eating red meat due to his own research. He is avoiding soft drinks, he drinks the occasional tea, but is mostly drinking water.    Expected Outcome Short: Continue to increase protein intake and avoid soft drinks. Long: Continue heart healthy eating patterns as discussed with RD.             Psychosocial: Target Goals: Acknowledge presence or absence of significant depression and/or stress, maximize coping skills, provide positive support system. Participant is able to verbalize types and ability to use techniques and skills needed for reducing stress and depression.   Education: Stress, Anxiety, and Depression - Group verbal and visual presentation to define topics covered.  Reviews how body is impacted by stress, anxiety, and depression.  Also discusses healthy ways to reduce stress and to treat/manage anxiety and depression.  Written material given at graduation. Flowsheet Row Cardiac Rehab from 05/27/2023 in [facility] Card[facility] Re[facility]  Date 05/27/23   Educator SB  Instruction Review Code 1- [employer] Understanding       Education: Sleep Hygiene -Provides group verbal and written instruction about how sleep can affect your health.  Define sleep hygiene, discuss sleep cycles and impact of sleep habits. Review good sleep hygiene tips.    Initial Review & Psychosocial Screening:  Initial Psych Review & Screening - 05/06/23 1337       Initial Review   Current issues with None Identified      Family Dynamics   Good Support System? Yes   wife, friends     Barriers   Psychosocial barriers to participate in program There are no identifiable barriers or psychosocial needs.      Screening Interventions   Interventions Encouraged to exercise;To provide support and resources with identified psychosocial needs    Expected Outcomes Short Term goal: Utilizing psychosocial counselor, staff and physician to assist with identification of specific Stressors or current issues interfering with healing process. Setting desired goal for each stressor or current issue identified.;Long Term Goal: Stressors or current issues are controlled or eliminated.;Short Term goal: Identification and review with participant of any Quality of Life or Depression concerns found by scoring the questionnaire.;Long Term goal: The participant improves quality of Life and PHQ9 Scores as seen by post scores and/or verbalization of changes             Quality of Life Scores:   Quality of Life - 09/16/23 0949       Quality of Life Scores   Health/Function Pre 22.7 %    Health/Function Post 22.8 %    Health/Function % Change 0.44 %    Socioeconomic Pre 23.56 %    Socioeconomic Post 26.25 %    Socioeconomic % Change  11.42 %    Psych/Spiritual Pre 24.86 %    Psych/Spiritual Post 27.43 %    Psych/Spiritual % Change 10.34 %    Family Pre 27.6 %    Family Post 28.8 %    Family % Change 4.35 %    GLOBAL Pre 24.03 %    GLOBAL Post 25.37 %    GLOBAL % Change 5.58 %             Scores of 19 and below usually indicate a poorer quality of  life in these areas.  A difference of  2-3 points is a clinically meaningful difference.  A difference of 2-3 points in the total score of the Quality of Life Index has been associated with significant improvement in overall quality of life, self-image, physical symptoms, and general health in studies assessing change in quality of life.  PHQ-9: Review Flowsheet  More data exists      09/16/2023 06/25/2023 05/25/2023 06/22/2022 03/04/2022  Depression screen PHQ 2/9  Decreased Interest 0 0 0 0 0  Down, Depressed, Hopeless 0 0 0 0 0  PHQ - 2 Score 0 0 0 0 0  Altered sleeping 0 0 1 - -  Tired, decreased energy 1 0 1 - -  Change in appetite 0 0 0 - -  Feeling bad or failure about yourself  0 0 0 - -  Trouble concentrating 0 0 0 - -  Moving slowly or fidgety/restless 0 0 0 - -  Suicidal thoughts 0 0 0 - -  PHQ-9 Score 1 0 2 - -  Difficult doing work/chores Not difficult at all Not difficult at all Not difficult at all - -   Interpretation of Total Score  Total Score Depression Severity:  1-4 = Minimal depression, 5-9 = Mild depression, 10-14 = Moderate depression, 15-19 = Moderately severe depression, 20-27 = Severe depression   Psychosocial Evaluation and Intervention:  Psychosocial Evaluation - 05/06/23 1355       Psychosocial Evaluation & Interventions   Interventions Encouraged to exercise with the program and follow exercise prescription    Comments Ora is coming to cardiac rehab with stable angina. He meets with his cardiologist this week to discuss options for his blockage. He currently does not struggle with chest pain but knows what medication to take if he starts. 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 His wife and friends are a great support system for him. He does have leg pain with walking which can hinder his activites but he states that he just rests some and then gets back to what he was doing. He  mentions he has a positive outlook and is ready to start the program to learn what he can/should be doing.    Expected Outcomes Short: attend cardiac rehab for education and exercise. Long: Develop and maintain positive self care habits.    Continue Psychosocial Services  Follow up required by staff             Psychosocial Re-Evaluation:  Psychosocial Re-Evaluation     Row Name 06/15/23 1039 06/24/23 1027 07/20/23 0945 08/12/23 0921 09/14/23 0954     Psychosocial Re-Evaluation   Current issues with None Identified;Current Sleep Concerns -- None Identified None Identified None Identified   Comments Tijuan reports no major stressors at this time. 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 He also states that exercise has beneficial for helping him learn his limits. His wife and friends make up a good support system for him. He has not been sleeping well as he wakes up throughout the night, but he states this is his normal. Raju states he has no concerns.  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 His wife is a great support to him. Siddarth states that he has no major stressors at this time. He also continues to sleep well. His wife has been a good support system for him. He enjoys working in his shop for stress relief. He states  he is doing well with sleep, currently getting ~7hrs each night. Wakes up a few times to use restroom. No stressors, anxeity or depression currently. He has his wife at home to help him if any of these issues should arise. He states that he has no major stressors at this time. He states that he has no issues sleeping at this time. He reports that his wife is a good support system for him.   Expected Outcomes Short: Continue to relieve stress through healthy avenues. Long: Continue to maintain positive outlook. STG Lyndon to continue to work in his workshop, to rec Short: Continue to work in shop for stress relief. Long: Maintain  positive outlook. STG: continue to use support system and hobbies of enjoyment like working in shop to help relieve stress. LTG: maintain positive outlook on health outcomes and daily life Short: Continue to relieve stress through healthy avenues. Long: Continue to maintain positive outlook.   Interventions Encouraged to attend Cardiac Rehabilitation for the exercise -- Encouraged to attend Cardiac Rehabilitation for the exercise Encouraged to attend Cardiac Rehabilitation for the exercise Encouraged to attend Cardiac Rehabilitation for the exercise   Continue Psychosocial Services  Follow up required by staff -- Follow up required by staff Follow up required by staff Follow up required by staff            Psychosocial Discharge (Final Psychosocial Re-Evaluation):  Psychosocial Re-Evaluation - 09/14/23 0954       Psychosocial Re-Evaluation   Current issues with None Identified    Comments He states that he has no major stressors at this time. He states that he has no issues sleeping at this time. He reports that his wife is a good support system for him.    Expected Outcomes Short: Continue to relieve stress through healthy avenues. Long: Continue to maintain positive outlook.    Interventions Encouraged to attend Cardiac Rehabilitation for the exercise    Continue Psychosocial Services  Follow up required by staff             Vocational Rehabilitation: Provide vocational rehab assistance to qualifying candidates.   Vocational Rehab Evaluation & Intervention:  Vocational Rehab - 05/06/23 1336       Initial Vocational Rehab Evaluation & Intervention   Assessment shows need for Vocational Rehabilitation No             Education: Education Goals: Education classes will be provided on a variety of topics geared toward better understanding of heart health and risk factor modification. Participant will state understanding/return demonstration of topics presented as noted by  education test scores.  Learning Barriers/Preferences:  Learning Barriers/Preferences - 05/06/23 1336       Learning Barriers/Preferences   Learning Barriers None    Learning Preferences None             General Cardiac Education Topics:  AED/CPR: - Group verbal and written instruction with the use of models to demonstrate the basic use of the AED with the basic ABC's of resuscitation.   Anatomy and Cardiac Procedures: - Group verbal and visual presentation and models provide information about basic cardiac anatomy and function. Reviews the testing methods done to diagnose heart disease and the outcomes of the test results. Describes the treatment choices: Medical Management, Angioplasty, or Coronary Bypass Surgery for treating various heart conditions including Myocardial Infarction, Angina, Valve Disease, and Cardiac Arrhythmias.  Written material given at graduation.   Medication Safety: - Group verbal and visual instruction to review commonly  prescribed medications for heart and lung disease. Reviews the medication, class of the drug, and side effects. Includes the steps to properly store meds and maintain the prescription regimen.  Written material given at graduation.   Intimacy: - Group verbal instruction through game format to discuss how heart and lung disease can affect sexual intimacy. Written material given at graduation..   Know Your Numbers and Heart Failure: - Group verbal and visual instruction to discuss disease risk factors for cardiac and pulmonary disease and treatment options.  Reviews associated critical values for Overweight/Obesity, Hypertension, Cholesterol, and Diabetes.  Discusses basics of heart failure: signs/symptoms and treatments.  Introduces Heart Failure Zone chart for action plan for heart failure.  Written material given at graduation.   Infection Prevention: - Provides verbal and written material to individual with discussion of infection  control including proper hand washing and proper equipment cleaning during exercise session. Flowsheet Row Cardiac Rehab from 05/27/2023 in [facility] Rehab  Date 05/20/23  Educator MB  Instruction Review Code 1- Verbalizes Understanding       Falls Prevention: - Provides verbal and written material to individual with discussion of falls prevention and safety. Flowsheet Row Cardiac Rehab from 05/27/2023 in [facility] Cardiac and Pulmonary Rehab  Date 05/20/23  Educator MB  Instruction Review Code 1- Verbalizes Understanding       Other: -Provides group and verbal instruction on various topics (see comments)   Knowledge Questionnaire Score:  Knowledge Questionnaire Score - 09/16/23 0940       Knowledge Questionnaire Score   Pre Score 24/26    Post Score 25/26             Core Components/Risk Factors/Patient Goals at Admission:  Personal Goals and Risk Factors at Admission - 05/20/23 1501       Core Components/Risk Factors/Patient Goals on Admission    Weight Management Yes;Weight Maintenance    Intervention Weight Management: Develop a combined nutrition and exercise program designed to reach desired caloric intake, while maintaining appropriate intake of nutrient and fiber, sodium and fats, and appropriate energy expenditure required for the weight goal.;Weight Management: Provide education and appropriate resources to help participant work on and attain dietary goals.;Weight Management/Obesity: Establish reasonable short term and long term weight goals.    Admit Weight 164 lb 6.4 oz (74.6 kg)    Goal Weight: Short Term 164 lb 6.4 oz (74.6 kg)    Goal Weight: Long Term 164 lb 6.4 oz (74.6 kg)    Expected Outcomes Short Term: Continue to assess and modify interventions until short term weight is achieved;Long Term: Adherence to nutrition and physical activity/exercise program aimed toward attainment of established weight goal;Weight Maintenance: Understanding  of the daily nutrition guidelines, which includes 25-35% calories from fat, 7% or less cal from saturated fats, less than 200mg  cholesterol, less than 1.5gm of sodium, & 5 or more servings of fruits and vegetables daily;Understanding recommendations for meals to include 15-35% energy as protein, 25-35% energy from fat, 35-60% energy from carbohydrates, less than 200mg  of dietary cholesterol, 20-35 gm of total fiber daily;Understanding of distribution of calorie intake throughout the day with the consumption of 4-5 meals/snacks    Hypertension Yes    Intervention Provide education on lifestyle modifcations including regular physical activity/exercise, weight management, moderate sodium restriction and increased consumption of fresh fruit, vegetables, and low fat dairy, alcohol moderation, and smoking cessation.;Monitor prescription use compliance.    Expected Outcomes Short Term: Continued assessment and intervention until BP is <  140/60mm HG in hypertensive participants. < 130/21mm HG in hypertensive participants with diabetes, heart failure or chronic kidney disease.;Long Term: Maintenance of blood pressure at goal levels.    Lipids Yes    Intervention Provide education and support for participant on nutrition & aerobic/resistive exercise along with prescribed medications to achieve LDL 70mg , HDL >40mg .    Expected Outcomes Short Term: Participant states understanding of desired cholesterol values and is compliant with medications prescribed. Participant is following exercise prescription and nutrition guidelines.;Long Term: Cholesterol controlled with medications as prescribed, with individualized exercise RX and with personalized nutrition plan. Value goals: LDL < 70mg , HDL > 40 mg.             Education:Diabetes - Individual verbal and written instruction to review signs/symptoms of diabetes, desired ranges of glucose level fasting, after meals and with exercise. Acknowledge that pre and post  exercise glucose checks will be done for 3 sessions at entry of program.   Core Components/Risk Factors/Patient Goals Review:   Goals and Risk Factor Review     Row Name 06/15/23 1045 06/24/23 1325 07/20/23 0950 08/12/23 0925 09/14/23 0957     Core Components/Risk Factors/Patient Goals Review   Personal Goals Review Weight Management/Obesity;Hypertension Weight Management/Obesity;Hypertension;Lipids Weight Management/Obesity;Hypertension;Lipids Hypertension;Weight Management/Obesity;Lipids Hypertension;Weight Management/Obesity;Lipids   Review Jontae is doing well and would like to continue to maintain his weight between 160-165 lbs. He also owns a BP cuff and uses it to check his BP about once or twice a week. He reports that his BP has stayed within normal ranges. He continues to take all of his medications as prescribed. Khy continues to work on maintaining his weight around 106-165 lbs. His wife is working to help him eat healthy. More fruit, no red meat.  He is compliant with his medications, working on his exercise progression while managing his angina symptoms and managing his hypertension and cholesterol.  His BP is higher on arrival and in a good range at discharge. We reviewed that thie exit BP can become his entry BP as he continues with meds, exercise and nutrition plan. He is compliant withhis choleaterol med. Gram continues to work on maintaining his weight between 160-165 lbs. He states that he is doing well with weight maintance and weighed in today at 164.5 lb. He also continues to check his BP at home occasionally and states that it has stayed within normal ranges. He also continues to take all his medications as prescribed. He is focusing on maintianing his weight of ~160-165lbs. He his happy with this weight. He check his BP at home, 120-130/75-85. He is taking his medications as precribed and working on making exercise and [facility] dietary changes more of a habit to improve  LDL and HDL Jaryd states that he is still trying to maintain his weight right around 160 lbs. He weighed in today at 162 lb. He is checking his BP a few times a week at home with his personal cuff. He reports that his BP has stayed within normal ranges. He also states that he is taking all of his meds as prescribed.   Expected Outcomes Short: Continue to check BP regularly. Long: Continue to manage lifestyle risk factors. Short: Continue to check BP regularly. and continue to work on exercise progression as limited by anginal symptoms.  Long: Continue to manage lifestyle risk factors. Short: Continue to check BP regularly. Long: Continue to monitor lifestyle risk factors. STG: Continue to check BP at home take meds. LTG maintain weight follow  heart healthy lifestyle Short: Continue to check BP at home and take all meds as prescribed. Long: Continue to manage lifestyle risk factors.            Core Components/Risk Factors/Patient Goals at Discharge (Final Review):   Goals and Risk Factor Review - 09/14/23 0957       Core Components/Risk Factors/Patient Goals Review   Personal Goals Review Hypertension;Weight Management/Obesity;Lipids    Review Joandry states that he is still trying to maintain his weight right around 160 lbs. He weighed in today at 162 lb. He is checking his BP a few times a week at home with his personal cuff. He reports that his BP has stayed within normal ranges. He also states that he is taking all of his meds as prescribed.    Expected Outcomes Short: Continue to check BP at home and take all meds as prescribed. Long: Continue to manage lifestyle risk factors.             ITP Comments:  ITP Comments     Row Name 05/06/23 1343 05/20/23 1451 05/25/23 0953 06/16/23 0851 07/07/23 0929   ITP Comments Initial phone call completed. Diagnosis can be found in [facility] 8/30. EP Orientation scheduled for Thursday 10/3 at 9:30. Completed and gym orientation. Initial ITP created  and sent for review to Dr. Bethann Punches, Medical Director. First full day of exercise!  Patient was oriented to gym and equipment including functions, settings, policies, and procedures.  Patient's individual exercise prescription and treatment plan were reviewed.  All starting workloads were established based on the results of the 6 minute walk test done at initial orientation visit.  The plan for exercise progression was also introduced and progression will be customized based on patient's performance and goals. 30 Day review completed. Medical Director ITP review done, changes made as directed, and signed approval by Medical Director.    new to program 30 Day review completed. Medical Director ITP review done, changes made as directed, and signed approval by Medical Director.    Row Name 08/04/23 1144 09/01/23 1258 09/29/23 0816       ITP Comments 30 Day review completed. Medical Director ITP review done, changes made as directed, and signed approval by Medical Director. 30 Day review completed. Medical Director ITP review done, changes made as directed, and signed approval by Medical Director. 30 Day review completed. Medical Director ITP review done, changes made as directed, and signed approval by Medical Director.              Comments: 30 day review

## 2023-09-30 DIAGNOSIS — R1111 Vomiting without nausea: Secondary | ICD-10-CM | POA: Diagnosis not present

## 2023-09-30 DIAGNOSIS — R110 Nausea: Secondary | ICD-10-CM | POA: Diagnosis not present

## 2023-09-30 DIAGNOSIS — R6889 Other general symptoms and signs: Secondary | ICD-10-CM | POA: Diagnosis not present

## 2023-09-30 DIAGNOSIS — Z743 Need for continuous supervision: Secondary | ICD-10-CM | POA: Diagnosis not present

## 2023-09-30 DIAGNOSIS — I499 Cardiac arrhythmia, unspecified: Secondary | ICD-10-CM | POA: Diagnosis not present

## 2023-10-06 ENCOUNTER — Telehealth: Payer: Medicare Other | Admitting: Physician Assistant

## 2023-10-06 DIAGNOSIS — J019 Acute sinusitis, unspecified: Secondary | ICD-10-CM | POA: Diagnosis not present

## 2023-10-06 DIAGNOSIS — B9689 Other specified bacterial agents as the cause of diseases classified elsewhere: Secondary | ICD-10-CM

## 2023-10-06 MED ORDER — DOXYCYCLINE HYCLATE 100 MG PO TABS: 100.0000 mg | ORAL_TABLET | Freq: Two times a day (BID) | ORAL | 0 refills | Status: DC

## 2023-10-06 NOTE — Progress Notes (Signed)

## 2023-10-06 NOTE — Progress Notes (Signed)
 I have spent 5 minutes in review of e-visit questionnaire, review and updating patient chart, medical decision making and response to patient.   Piedad Climes, PA-C

## 2023-10-12 ENCOUNTER — Encounter: Payer: Medicare Other | Admitting: *Deleted

## 2023-10-12 DIAGNOSIS — I2089 Other forms of angina pectoris: Secondary | ICD-10-CM | POA: Diagnosis not present

## 2023-10-12 NOTE — Progress Notes (Signed)
 Daily Session Note  Patient Details  Name: RAWSON MINIX MRN: [id number] Date of Birth: [birth year] Referring Provider:   Flowsheet Row Cardiac Rehab from 05/20/2023 in [facility] Cardiac [facility]monary Rehab  Referring Provider Vevelyn Francois, MD       Encounter Date: 10/12/2023  Check In:  Session Check In - 10/12/23 4098       Check-In   Supervising physician immediately available to respond to emergencies See telemetry face sheet for immediately available ER MD    Location [facility]-Cardiac & Pulmonary Rehab    Staff Present Cora Collum, RN, BSN, CCRP;Margaret Best, MS, Exercise Physiologist;Maxon Conetta BS, Exercise Physiologist    Virtual Visit No    Medication changes reported     No    Fall or balance concerns reported    No    Warm-up and Cool-down Performed on first and last piece of equipment    Resistance Training Performed Yes    VAD Patient? No    PAD/SET Patient? No      Pain Assessment   Currently in Pain? No/denies                Social History   Tobacco Use  Smoking Status Former   Current packs/day: 0.00   Types: Cigarettes   Quit date: 11/11/1996   Years since quitting: 26.9  Smokeless Tobacco Never    Goals Met:  Independence with exercise equipment Exercise tolerated well No report of concerns or symptoms today  Goals Unmet:  Not Applicable  Comments: Pt able to follow exercise prescription today without complaint.  Will continue to monitor for progression.    Dr. Bethann Punches is Medical Director for [facility] Cardiac Rehabilitation.  Dr. Vida Rigger is Medical Director for [facility] Pulmonary Rehabilitation.

## 2023-10-14 ENCOUNTER — Encounter: Payer: Medicare Other | Admitting: *Deleted

## 2023-10-14 DIAGNOSIS — I2089 Other forms of angina pectoris: Secondary | ICD-10-CM | POA: Diagnosis not present

## 2023-10-14 NOTE — Progress Notes (Signed)
 Discharge Summary   Timothy Garrison  DOB: [birth year]  Timothy Garrison graduated today from  rehab with 36 sessions completed.  Details of the patient's exercise prescription and what He needs to do in order to continue the prescription and progress were discussed with patient.  Patient was given a copy of prescription and goals.  Patient verbalized understanding. Timothy Garrison plans to continue to exercise by using his recumbent bike at home.   6 Minute Walk     Row Name 05/20/23 1452 09/14/23 0934       6 Minute Walk   Phase Initial Discharge    Distance 1330 feet 1795 feet    Distance % Change -- 34.96 %    Distance Feet Change -- 465 ft    Walk Time 6 minutes 6 minutes    # of Rest Breaks 0 0    MPH 2.52 3.4    METS 3.9 5.44    RPE 12 15    Perceived Dyspnea  0 1    VO2 Peak 13.64 19.05    Symptoms Yes (comment) Yes (comment)    Comments 4/10 Leg pain 6/10 leg pain    Resting HR 85 bpm 101 bpm    Resting BP 146/86 170/88    Resting Oxygen Saturation  97 % 97 %    Exercise Oxygen Saturation  during 6 min walk 99 % 98 %    Max Ex. HR 107 bpm 139 bpm    Max Ex. BP 170/92 200/90    2 Minute Post BP 158/92 158/84

## 2023-10-14 NOTE — Progress Notes (Signed)
 Daily Session Note  Patient Details  Name: Timothy Garrison MRN: [id number] Date of Birth: 05-16-1961 Referring Provider:   Flowsheet Row Cardiac Rehab from 05/20/2023 in [facility] Cardiac and Pulmonary Rehab  Referring Provider Vevelyn Francois, MD       Encounter Date: 10/14/2023  Check In:  Session Check In - 10/14/23 0941       Check-In   Supervising physician immediately available to respond to emergencies See telemetry face sheet for immediately available ER MD    Location [facility]-Cardiac & Pulmonary Rehab    Staff Present Susann Givens RN,BSN;Joseph Reino Kent RCP,RRT,BSRT;Noah Lincoln, [place], Exercise Physiologist    Virtual Visit No    Medication changes reported     No    Fall or balance concerns reported    No    Warm-up and Cool-down Performed on first and last piece of equipment    Resistance Training Performed Yes    VAD Patient? No    PAD/SET Patient? No      Pain Assessment   Currently in Pain? No/denies                Social History   Tobacco Use  Smoking Status Former   Current packs/day: 0.00   Types: Cigarettes   Quit date: 11/11/1996   Years since quitting: 26.9  Smokeless Tobacco Never    Goals Met:  Independence with exercise equipment Exercise tolerated well No report of concerns or symptoms today Strength training completed today  Goals Unmet:  Not Applicable  Comments:  Timothy Garrison graduated today from  rehab with 36 sessions completed.  Details of the patient's exercise prescription and what He needs to do in order to continue the prescription and progress were discussed with patient.  Patient was given a copy of prescription and goals.  Patient verbalized understanding. Timothy Garrison plans to continue to exercise by using his recumbent bike at home.     Dr. Bethann Punches is Medical Director for [facility] Cardiac Rehabilitation.  Dr. Vida Rigger is Medical Director for [facility] Pulmonary Rehabilitation.

## 2023-10-14 NOTE — Progress Notes (Signed)
 Cardiac Individual Treatment Plan  Patient Details  Name: Timothy Garrison MRN: [id number] Date of Birth: 1960-10-16 Referring Provider:   Flowsheet Row Cardiac Rehab from 05/20/2023 in [facility] Cardiac and Pulmonary Rehab  Referring Provider Vevelyn Francois, MD       Initial Encounter Date:  Flowsheet Row Cardiac Rehab from 05/20/2023 in [facility] Cardiac and Pulmonary Rehab  Date 05/20/23       Visit Diagnosis: Chronic stable angina (HCC)  Patient's Home Medications on Admission:  Current Outpatient Medications:    apixaban (ELIQUIS) 5 MG TABS tablet, Take 1 tablet (5 mg total) by mouth every 12 (twelve) hours., Disp: 180 tablet, Rfl: 1   aspirin EC 81 MG tablet, Take 81 mg by mouth daily. Swallow whole., Disp: , Rfl:    busPIRone (BUSPAR) 5 MG tablet, Take 1 tablet (5 mg total) by mouth daily as needed., Disp: 30 tablet, Rfl: 1   doxycycline (VIBRA-TABS) 100 MG tablet, Take 1 tablet (100 mg total) by mouth 2 (two) times daily., Disp: 20 tablet, Rfl: 0   lisinopril (ZESTRIL) 40 MG tablet, Take 1 tablet (40 mg total) by mouth daily., Disp: 90 tablet, Rfl: 3   Multiple Vitamin (MULTIVITAMIN) capsule, Take by mouth., Disp: , Rfl:    nitroGLYCERIN (NITROSTAT) 0.4 MG SL tablet, Place 0.4 mg under the tongue every 5 (five) minutes as needed for chest pain., Disp: , Rfl:    omeprazole (PRILOSEC) 20 MG capsule, Take 1 capsule (20 mg total) by mouth 2 (two) times daily., Disp: 180 capsule, Rfl: 3   pravastatin (PRAVACHOL) 40 MG tablet, Take 1 tablet by mouth daily., Disp: , Rfl:    REPATHA SURECLICK 140 MG/ML SOAJ, INJECT 140 MG INTO THE SKIN EVERY 14 DAYS, Disp: 2 mL, Rfl: 2  Past Medical History: Past Medical History:  Diagnosis Date   Basal cell carcinoma (BCC) of back    Basal cell carcinoma (BCC) of nasal sidewall    Dizziness    GERD (gastroesophageal reflux disease)    H/O blood clots    Headache    migraines   Hypercholesterolemia    Hypertension    hx of HBP readings   Metastatic  squamous cell carcinoma 2018   bx cervical lymph node   Seizures (HCC) 2021   Tonsillar cancer (HCC)    Vascular abnormality    VTE (venous thromboembolism) 2018   associated with chemotherapy    Tobacco Use: Social History   Tobacco Use  Smoking Status Former   Current packs/day: 0.00   Types: Cigarettes   Quit date: 11/11/1996   Years since quitting: 26.9  Smokeless Tobacco Never    Labs: Review Flowsheet  More data exists      Latest Ref Rng & Units 03/04/2022 06/05/2022 09/08/2022 01/07/2023 08/06/2023  Labs for ITP Cardiac and Pulmonary Rehab  Cholestrol 0 - 200 mg/dL 191  478  295  621  308   LDL (calc) 0 - 99 mg/dL 657  846  - 32  46   Direct LDL mg/dL - - 962.9  - -  HDL-C >52.84 mg/dL 13.24  40.10  27.25  36.64  45.20   Trlycerides 0.0 - 149.0 mg/dL 403.4  742.5  956.3  875.6  114.0      Exercise Target Goals: Exercise Program Goal: Individual exercise prescription set using results from initial 6 min walk test and THRR while considering  patient's activity barriers and safety.   Exercise Prescription Goal: Initial exercise prescription builds to 30-45 minutes a day  of aerobic activity, 2-3 days per week.  Home exercise guidelines will be given to patient during program as part of exercise prescription that the participant will acknowledge.   Education: Aerobic Exercise: - Group verbal and visual presentation on the components of exercise prescription. Introduces F.I.T.T principle from ACSM for exercise prescriptions.  Reviews F.I.T.T. principles of aerobic exercise including progression. Written material given at graduation. Flowsheet Row Cardiac Rehab from 05/27/2023 in [facility] Cardiac and Pulmonary Rehab  Education need identified 05/20/23       Education: Resistance Exercise: - Group verbal and visual presentation on the components of exercise prescription. Introduces F.I.T.T principle from ACSM for exercise prescriptions  Reviews F.I.T.T. principles of  resistance exercise including progression. Written material given at graduation.    Education: Exercise & Equipment Safety: - Individual verbal instruction and demonstration of equipment use and safety with use of the equipment. Flowsheet Row Cardiac Rehab from 05/27/2023 in [facility] Cardiac and Pulmonary Rehab  Date 05/20/23  Educator MB  Instruction Review Code 1- Verbalizes Understanding       Education: Exercise Physiology & General Exercise Guidelines: - Group verbal and written instruction with models to review the exercise physiology of the cardiovascular system and associated critical values. Provides general exercise guidelines with specific guidelines to those with heart or lung disease.    Education: Flexibility, Balance, Mind/Body Relaxation: - Group verbal and visual presentation with interactive activity on the components of exercise prescription. Introduces F.I.T.T principle from ACSM for exercise prescriptions. Reviews F.I.T.T. principles of flexibility and balance exercise training including progression. Also discusses the mind body connection.  Reviews various relaxation techniques to help reduce and manage stress (i.e. Deep breathing, progressive muscle relaxation, and visualization). Balance handout provided to take home. Written material given at graduation.   Activity Barriers & Risk Stratification:  Activity Barriers & Cardiac Risk Stratification - 05/20/23 1453       Activity Barriers & Cardiac Risk Stratification   Activity Barriers Other (comment);Neck/Spine Problems    Comments leg pain with walking    Cardiac Risk Stratification High             6 Minute Walk:  6 Minute Walk     Row Name 05/20/23 1452 09/14/23 0934       6 Minute Walk   Phase Initial Discharge    Distance 1330 feet 1795 feet    Distance % Change -- 34.96 %    Distance Feet Change -- 465 ft    Walk Time 6 minutes 6 minutes    # of Rest Breaks 0 0    MPH 2.52 3.4    METS 3.9  5.44    RPE 12 15    Perceived Dyspnea  0 1    VO2 Peak 13.64 19.05    Symptoms Yes (comment) Yes (comment)    Comments 4/10 Leg pain 6/10 leg pain    Resting HR 85 bpm 101 bpm    Resting BP 146/86 170/88    Resting Oxygen Saturation  97 % 97 %    Exercise Oxygen Saturation  during 6 min walk 99 % 98 %    Max Ex. HR 107 bpm 139 bpm    Max Ex. BP 170/92 200/90    2 Minute Post BP 158/92 158/84             Oxygen Initial Assessment:   Oxygen Re-Evaluation:   Oxygen Discharge (Final Oxygen Re-Evaluation):   Initial Exercise Prescription:  Initial Exercise Prescription - 05/20/23 1400  Date of Initial Exercise RX and Referring Provider   Date 05/20/23    Referring Provider Vevelyn Francois, MD      Oxygen   Maintain Oxygen Saturation 88% or higher      Treadmill   MPH 2.5    Grade 0    Minutes 15    METs 2.91      Recumbant Bike   Level 3    RPM 50    Watts 45    Minutes 15    METs 3.9      NuStep   Level 3    SPM 80    Minutes 15    METs 3.9      Elliptical   Level 2    Speed 3    Minutes 15    METs 3.9      Prescription Details   Frequency (times per week) 2    Duration Progress to 30 minutes of continuous aerobic without signs/symptoms of physical distress      Intensity   THRR 40-80% of Max Heartrate 114-143    Ratings of Perceived Exertion 11-13    Perceived Dyspnea 0-4      Progression   Progression Continue to progress workloads to maintain intensity without signs/symptoms of physical distress.      Resistance Training   Training Prescription Yes    Weight 5 lb    Reps 10-15             Perform Capillary Blood Glucose checks as needed.  Exercise Prescription Changes:   Exercise Prescription Changes     Row Name 05/20/23 1400 06/04/23 0800 06/16/23 1100 07/13/23 0800 07/20/23 1000     Response to Exercise   Blood Pressure (Admit) 146/86 122/70 136/70 144/72 --   Blood Pressure (Exercise) 170/92 164/86 158/82 -- --    Blood Pressure (Exit) 146/82 124/78 104/62 110/70 --   Heart Rate (Admit) 85 bpm 93 bpm 92 bpm 116 bpm --   Heart Rate (Exercise) 107 bpm 132 bpm 145 bpm 145 bpm --   Heart Rate (Exit) 72 bpm 111 bpm 109 bpm 115 bpm --   Oxygen Saturation (Admit) 97 % -- -- -- --   Oxygen Saturation (Exercise) 99 % -- -- -- --   Oxygen Saturation (Exit) 99 % -- -- -- --   Rating of Perceived Exertion (Exercise) 12 13 14 14  --   Perceived Dyspnea (Exercise) 0 -- -- -- --   Symptoms 4/10 leg pain none none none --   Comments results First two days of exercise -- -- --   Duration Progress to 30 minutes of  aerobic without signs/symptoms of physical distress Progress to 30 minutes of  aerobic without signs/symptoms of physical distress Continue with 30 min of aerobic exercise without signs/symptoms of physical distress. Continue with 30 min of aerobic exercise without signs/symptoms of physical distress. --   Intensity THRR New THRR unchanged THRR unchanged THRR unchanged --     Progression   Progression Continue to progress workloads to maintain intensity without signs/symptoms of physical distress. Continue to progress workloads to maintain intensity without signs/symptoms of physical distress. Continue to progress workloads to maintain intensity without signs/symptoms of physical distress. Continue to progress workloads to maintain intensity without signs/symptoms of physical distress. --   Average METs 3.9 3.05 3.24 3.6 --     Resistance Training   Training Prescription -- Yes Yes Yes --   Weight -- 5 lb 5 lb 5 lb --  Reps -- 10-15 10-15 10-15 --     Interval Training   Interval Training -- No No No --     Treadmill   MPH -- 2.2 2.6 2.6 --   Grade -- 0 0 0 --   Minutes -- 30 15 15  --   METs -- 2.69 2.99 2.99 --     Recumbant Bike   Level -- 5 7 5  --   Watts -- 37 50 46 --   Minutes -- 15 15 15  --   METs -- 3.57 4.12 3.95 --     NuStep   Level -- 3 4  T6: 3 5 --   Minutes -- 15 15 15   --   METs -- 3.1 3.5  T6: 2.8 4 --     T5 Nustep   Level -- -- -- 2 --   Minutes -- -- -- 15 --   METs -- -- -- 2.6 --     Home Exercise Plan   Plans to continue exercise at -- -- -- -- Home (comment)  Plans to use his at home recumbent bike for at least 30 minutes, 3 times a week.   Frequency -- -- -- -- Add 3 additional days to program exercise sessions.   Initial Home Exercises Provided -- -- -- -- 07/20/23     Oxygen   Maintain Oxygen Saturation -- 88% or higher 88% or higher 88% or higher --    Row Name 07/29/23 1500 08/12/23 0900 08/26/23 1600 09/09/23 1100 09/23/23 1400     Response to Exercise   Blood Pressure (Admit) 142/80 122/70 142/80 150/80 158/78   Blood Pressure (Exit) 122/62 142/68 120/84 140/62 138/76   Heart Rate (Admit) 114 bpm 113 bpm 124 bpm 75 bpm 110 bpm   Heart Rate (Exercise) 140 bpm 145 bpm 147 bpm 136 bpm 136 bpm   Heart Rate (Exit) 105 bpm 108 bpm 124 bpm 122 bpm 127 bpm   Rating of Perceived Exertion (Exercise) 13 13 13 14 15    Symptoms none none -- none none   Duration Continue with 30 min of aerobic exercise without signs/symptoms of physical distress. Continue with 30 min of aerobic exercise without signs/symptoms of physical distress. Continue with 30 min of aerobic exercise without signs/symptoms of physical distress. Continue with 30 min of aerobic exercise without signs/symptoms of physical distress. Continue with 30 min of aerobic exercise without signs/symptoms of physical distress.   Intensity THRR unchanged THRR unchanged THRR unchanged THRR unchanged THRR unchanged     Progression   Progression Continue to progress workloads to maintain intensity without signs/symptoms of physical distress. Continue to progress workloads to maintain intensity without signs/symptoms of physical distress. Continue to progress workloads to maintain intensity without signs/symptoms of physical distress. Continue to progress workloads to maintain intensity without  signs/symptoms of physical distress. Continue to progress workloads to maintain intensity without signs/symptoms of physical distress.   Average METs 3.16 3.46 3.56 4.23 3.34     Resistance Training   Training Prescription Yes Yes Yes Yes Yes   Weight 5 lb 5 lb 6 lb 6 lb 6 lb   Reps 10-15 10-15 10-15 10-15 10-15     Interval Training   Interval Training No No No No No     Treadmill   MPH 2.6 2.7 2.7 -- 2.8   Grade 0 0 0 -- 0   Minutes 15 15 15  -- 15   METs 2.99 3.07 3.07 -- 3.14  Recumbant Bike   Level 5 5 6 5 6    Watts 46 46 40 44 44   Minutes 15 15 15 15 15    METs 3.95 3.95 3.7 3.86 3.88     NuStep   Level 5  T6: level 3 5 5 5 5    Minutes 15 15 15 15 15    METs 3.8  T6: 2.4 METs 4.9 4.9 4.6 4.4     T5 Nustep   Level -- 3  T6 3 -- 4   Minutes -- 15 15 -- 15   METs -- 2.7 2.5 -- 3.1     Home Exercise Plan   Plans to continue exercise at Home (comment)  Plans to use his at home recumbent bike for at least 30 minutes, 3 times a week. Home (comment)  Plans to use his at home recumbent bike for at least 30 minutes, 3 times a week. Home (comment)  Plans to use his at home recumbent bike for at least 30 minutes, 3 times a week. Home (comment)  Plans to use his at home recumbent bike for at least 30 minutes, 3 times a week. Home (comment)  Plans to use his at home recumbent bike for at least 30 minutes, 3 times a week.   Frequency Add 3 additional days to program exercise sessions. Add 3 additional days to program exercise sessions. Add 3 additional days to program exercise sessions. Add 3 additional days to program exercise sessions. Add 3 additional days to program exercise sessions.   Initial Home Exercises Provided 07/20/23 07/20/23 07/20/23 07/20/23 07/20/23     Oxygen   Maintain Oxygen Saturation 88% or higher 88% or higher 88% or higher 88% or higher 88% or higher    Row Name 10/06/23 1100             Response to Exercise   Blood Pressure (Admit) 132/78       Blood  Pressure (Exit) 120/64       Heart Rate (Admit) 99 bpm       Heart Rate (Exercise) 126 bpm       Heart Rate (Exit) 103 bpm       Rating of Perceived Exertion (Exercise) 14       Symptoms none       Duration Continue with 30 min of aerobic exercise without signs/symptoms of physical distress.       Intensity THRR unchanged         Progression   Progression Continue to progress workloads to maintain intensity without signs/symptoms of physical distress.       Average METs 3.98         Resistance Training   Training Prescription Yes       Weight 6 lb       Reps 10-15         Interval Training   Interval Training No         Treadmill   MPH 2.8       Grade 1.5       Minutes 15       METs 3.72         NuStep   Level 3  T6 nustep       Minutes 15       METs 3.1         REL-XR   Level 7       Minutes 15       METs 5.7  Home Exercise Plan   Plans to continue exercise at Home (comment)  Plans to use his at home recumbent bike for at least 30 minutes, 3 times a week.       Frequency Add 3 additional days to program exercise sessions.       Initial Home Exercises Provided 07/20/23         Oxygen   Maintain Oxygen Saturation 88% or higher                Exercise Comments:   Exercise Comments     Row Name 05/25/23 0953           Exercise Comments First full day of exercise!  Patient was oriented to gym and equipment including functions, settings, policies, and procedures.  Patient's individual exercise prescription and treatment plan were reviewed.  All starting workloads were established based on the results of the 6 minute walk test done at initial orientation visit.  The plan for exercise progression was also introduced and progression will be customized based on patient's performance and goals.                Exercise Goals and Review:   Exercise Goals     Row Name 05/20/23 1456             Exercise Goals   Increase Physical Activity Yes        Intervention Provide advice, education, support and counseling about physical activity/exercise needs.;Develop an individualized exercise prescription for aerobic and resistive training based on initial evaluation findings, risk stratification, comorbidities and participant's personal goals.       Expected Outcomes Short Term: Attend rehab on a regular basis to increase amount of physical activity.;Long Term: Exercising regularly at least 3-5 days a week.;Long Term: Add in home exercise to make exercise part of routine and to increase amount of physical activity.       Increase Strength and Stamina Yes       Intervention Provide advice, education, support and counseling about physical activity/exercise needs.;Develop an individualized exercise prescription for aerobic and resistive training based on initial evaluation findings, risk stratification, comorbidities and participant's personal goals.       Expected Outcomes Short Term: Increase workloads from initial exercise prescription for resistance, speed, and METs.;Short Term: Perform resistance training exercises routinely during rehab and add in resistance training at home;Long Term: Improve cardiorespiratory fitness, muscular endurance and strength as measured by increased METs and functional capacity ( )       Able to understand and use rate of perceived exertion (RPE) scale Yes       Intervention Provide education and explanation on how to use RPE scale       Expected Outcomes Short Term: Able to use RPE daily in rehab to express subjective intensity level;Long Term:  Able to use RPE to guide intensity level when exercising independently       Able to understand and use Dyspnea scale Yes       Intervention Provide education and explanation on how to use Dyspnea scale       Expected Outcomes Short Term: Able to use Dyspnea scale daily in rehab to express subjective sense of shortness of breath during exertion;Long Term: Able to use Dyspnea scale  to guide intensity level when exercising independently       Knowledge and understanding of Target Heart Rate Range (THRR) Yes       Intervention Provide education and explanation of THRR including how the  numbers were predicted and where they are located for reference       Expected Outcomes Short Term: Able to state/look up THRR;Long Term: Able to use THRR to govern intensity when exercising independently;Short Term: Able to use daily as guideline for intensity in rehab       Able to check pulse independently Yes       Intervention Provide education and demonstration on how to check pulse in carotid and radial arteries.;Review the importance of being able to check your own pulse for safety during independent exercise       Expected Outcomes Short Term: Able to explain why pulse checking is important during independent exercise;Long Term: Able to check pulse independently and accurately       Understanding of Exercise Prescription Yes       Intervention Provide education, explanation, and written materials on patient's individual exercise prescription       Expected Outcomes Short Term: Able to explain program exercise prescription;Long Term: Able to explain home exercise prescription to exercise independently                Exercise Goals Re-Evaluation :  Exercise Goals Re-Evaluation     Row Name 05/25/23 0953 06/04/23 0803 06/16/23 1137 07/13/23 0846 07/20/23 1003     Exercise Goal Re-Evaluation   Exercise Goals Review Able to understand and use rate of perceived exertion (RPE) scale;Knowledge and understanding of Target Heart Rate Range (THRR);Able to understand and use Dyspnea scale;Understanding of Exercise Prescription Increase Physical Activity;Understanding of Exercise Prescription;Increase Strength and Stamina Increase Physical Activity;Understanding of Exercise Prescription;Increase Strength and Stamina Increase Physical Activity;Understanding of Exercise Prescription;Increase  Strength and Stamina Understanding of Exercise Prescription;Able to understand and use Dyspnea scale;Increase Physical Activity;Knowledge and understanding of Target Heart Rate Range (THRR);Increase Strength and Stamina;Able to check pulse independently;Able to understand and use rate of perceived exertion (RPE) scale   Comments Reviewed RPE and dyspnea scale, THR and program prescription with pt today.  Pt voiced understanding and was given a copy of goals to take home. Maynor is off to a good start in the program. He was able to walk the treadmill at a speed of 2.2 mph with no incline. He also worked at level 3 on the T4 nustep and level 5 on the recumbent bike. He was able to tolerate 5 lb hand weights for resistance training as well. We will continue to monitor his progress in the program. Gunter is doing well in the program. He recently increased his workload on the treadmill by increasing his speed to 2.6 mph with no incline. He also improved to level 4 on the T4 nustep and level 7 on the recumbent bike. He continues to do well with 5 lb hand weights for resistance training as well. We will continue to monitor his progress in the program. Quinnlan continues to do well in the program. He continues to do well on the treadmill at 2.6 mph with no incline. He also improved to level 5 on the T4 nustep and level 2 on the T5 nustep  as well. We will continue to monitor his progress in the program. Reviewed home exercise with pt today.  Pt plans to use his at home recumbent bike for at least 30 minutes, 3 times a week for exercise.  Reviewed THR, pulse, RPE, sign and symptoms, pulse oximetery and when to call 911 or MD.  Also discussed weather considerations and indoor options.  Pt voiced understanding.   Expected Outcomes Short: Use  RPE daily to regulate intensity. Long: Follow program prescription in THR. Short: Continue to follow initial exercise prescription. Long: Continue exercise to improve strength and stamina.  Short: Continue to progressively increase treadmill workload by adding incline. Long: Continue exercise to improve strength and stamina. Short: Continue to progressively increase treadmill workload by adding incline. Long: Continue exercise to improve strength and stamina. Short: Continue to exercise at home as well as in rehab. Long: Continue to exercise to improve strength and stamina.    Row Name 07/29/23 1553 08/12/23 0919 08/12/23 0954 08/26/23 1626 09/09/23 1112     Exercise Goal Re-Evaluation   Exercise Goals Review Increase Physical Activity;Increase Strength and Stamina;Understanding of Exercise Prescription Increase Physical Activity;Increase Strength and Stamina;Understanding of Exercise Prescription Increase Physical Activity;Increase Strength and Stamina;Understanding of Exercise Prescription Increase Physical Activity;Increase Strength and Stamina;Understanding of Exercise Prescription Increase Physical Activity;Increase Strength and Stamina;Understanding of Exercise Prescription   Comments Dangelo continues to do well in rehab. He has stayed consistent on the treadmill at a speed of 2.6 mph with no incline. He also continues to work at level 3 on the T6 nustep, level 5 on the T4 nustep, and level 5 on the recumbent bike. We will continue to monitor his progress in the program. He is working out on recumbent bike at home, 3-4 times per week ontop of coming to rehab 2 times per week. Currently on treadmill 2. here at rehab. Spoke with him about future goals of working out independently at home on days he would have come to rehab once rehab is over. He reports he understands to importnace of continuing to workout at home Derryck continues to make improvements in rehab. He was able to increase his level on the T6 nustep from level 2 to 3. He was also able to increase his speed on the treadmill from 2. to 2.39mph. We will continue to monitor his progress in the program. Sung continues to do  well in rehab. He has consistently walked the treadmill at a speed of 2.7 mph with no incline. He also improved to level 6 on the recumbent bike and increased to 6 lb hand weights for resistance training. We will continue to monitor his progress in the program. Jahmel attended 1 session since the last review. He used the T4 on level 5 and the recumbent bike on level 5. He has maintained using the 6lb hand weights for resistance training. He is getting ready to do his post soon. We will continue to monitor his progress in the program.   Expected Outcomes Short: Add incline to treadmill workload. Long: Continue to exercise to improve strength and stamina. STG: add inclide to treadmill as able. LTG: Continue to exercise at home to improve strength and stamina Short: Continue to progressively increase treadmill workloads. Long: Continue exercise to improve strength and stamina. Short: Begin to progressively increase treadmill workload. Long: Continue exercise to improve strength and stamina. Short: Improve on post . Long: Continue to increase overall METs and stamina and graduate.    Row Name 09/14/23 0941 09/23/23 1500 10/06/23 1150         Exercise Goal Re-Evaluation   Exercise Goals Review Increase Physical Activity;Understanding of Exercise Prescription;Increase Strength and Stamina;Able to understand and use rate of perceived exertion (RPE) scale;Knowledge and understanding of Target Heart Rate Range (THRR) Increase Physical Activity;Increase Strength and Stamina;Understanding of Exercise Prescription Increase Physical Activity;Increase Strength and Stamina;Understanding of Exercise Prescription     Comments Chapman is interested in getting a  treadmill in addition to his recumbent bike at home for home aerobic exercise. We reviewed how to progess on the treadmill with his PAD symptoms and duration with both the bike and treadmill. We reviewed his THRR and RPE scale to use when exercising at home  and determining his intensity level. Ranjit continues to do well in rehab. He recently completed his post and improved by 35%! He also increased his treadmill speed to 2.8 mph with no inclne. He improved to level 4 on the T5 nustep and level 6 on the recumbent bike as well. We will continue to monitor his progress in the program. William is doing well in the program and is close to graduating. He recently added 1.5% incline to his treadmill workload while maintaining his speed at 2.8 mph. He also improved to level 7 on the XR. We will continue to monitor his progress until he graduates from the program.     Expected Outcomes Short: Add in aerobic exercise on the treadmill at home to add variety with his recumbent bike. Long: Continue to exercise independently at home to improve strength and stamina. Short: Graduate. Long: Continue to exercise independently. Short: Graduate. Long: Continue to exercise independently.              Discharge Exercise Prescription (Final Exercise Prescription Changes):  Exercise Prescription Changes - 10/06/23 1100       Response to Exercise   Blood Pressure (Admit) 132/78    Blood Pressure (Exit) 120/64    Heart Rate (Admit) 99 bpm    Heart Rate (Exercise) 126 bpm    Heart Rate (Exit) 103 bpm    Rating of Perceived Exertion (Exercise) 14    Symptoms none    Duration Continue with 30 min of aerobic exercise without signs/symptoms of physical distress.    Intensity THRR unchanged      Progression   Progression Continue to progress workloads to maintain intensity without signs/symptoms of physical distress.    Average METs 3.98      Resistance Training   Training Prescription Yes    Weight 6 lb    Reps 10-15      Interval Training   Interval Training No      Treadmill   MPH 2.8    Grade 1.5    Minutes 15    METs 3.72      NuStep   Level 3   T6 nustep   Minutes 15    METs 3.1      REL-XR   Level 7    Minutes 15    METs 5.7      Home  Exercise Plan   Plans to continue exercise at Home (comment)   Plans to use his at home recumbent bike for at least 30 minutes, 3 times a week.   Frequency Add 3 additional days to program exercise sessions.    Initial Home Exercises Provided 07/20/23      Oxygen   Maintain Oxygen Saturation 88% or higher             Nutrition:  Target Goals: Understanding of nutrition guidelines, daily intake of sodium 1500mg , cholesterol 200mg , calories 30% from fat and 7% or less from saturated fats, daily to have 5 or more servings of fruits and vegetables.  Education: All About Nutrition: -Group instruction provided by verbal, written material, interactive activities, discussions, models, and posters to present general guidelines for heart healthy nutrition including fat, fiber, MyPlate, the role of sodium  in heart healthy nutrition, utilization of the nutrition label, and utilization of this knowledge for meal planning. Follow up email sent as well. Written material given at graduation. Flowsheet Row Cardiac Rehab from 05/27/2023 in [facility] Cardiac and Pulmonary Rehab  Education need identified 05/20/23       Biometrics:  Pre Biometrics - 05/20/23 1457       Pre Biometrics   Height 5' 7.5" (1.715 m)    Weight 164 lb 6.4 oz (74.6 kg)    Waist Circumference 36.6 inches    Hip Circumference 38 inches    Waist to Hip Ratio 0.96 %    BMI (Calculated) 25.35    Single Leg Stand 21 seconds             Post Biometrics - 09/14/23 0940        Post  Biometrics   Height 5' 7.52" (1.715 m)    Weight 162 lb (73.5 kg)    Waist Circumference 36.5 inches    Hip Circumference 38.5 inches    Waist to Hip Ratio 0.95 %    BMI (Calculated) 24.98    Single Leg Stand 18.6 seconds             Nutrition Therapy Plan and Nutrition Goals:  Nutrition Therapy & Goals - 05/20/23 1206       Nutrition Therapy   Diet Cardiac, low na    Protein (specify units) 90    Fiber 30 grams    Whole Grain  Foods 3 servings    Saturated Fats 15 max. grams    Fruits and Vegetables 5 servings/day    Sodium 2 grams      Personal Nutrition Goals   Nutrition Goal Choose complex carbs and pair with healthy protein    Personal Goal #2 Include colorful produce at large meals or when eating out    Personal Goal #3 Make better snack choices    Comments Patient drinking mostly water. Sweet tea a few times a week, 6pack of beer per week. He is happy with the reductions he has made before coming to Rehab in these areas. Commended him on these changes and encouraged him to continue to work on them. He reports he eats 3 meals per day, wife cooks most of his food. He reports she is health conscious and has been helping him to reduce sodium, saturated fat, and sugary foods. Reviewed mediterranean diet handout, educated on types of fats, sources, and how to read a label. Reminded him to watch his sodium intake and choose low sodium varieties of foods he likes. He asked about iron as well. Spoke with him about using iron fortified foods like cereal, using Vitamin C to help absorption and avoiding competing nutrients like calcium at the same times. Built out several meals and snacks with foods he likes with focus on including more colorful produce and complex carbs paired with healthy protein.      Intervention Plan   Intervention Prescribe, educate and counsel regarding individualized specific dietary modifications aiming towards targeted core components such as weight, hypertension, lipid management, diabetes, heart failure and other comorbidities.;Nutrition handout(s) given to patient.    Expected Outcomes Short Term Goal: Understand basic principles of dietary content, such as calories, fat, sodium, cholesterol and nutrients.;Short Term Goal: A plan has been developed with personal nutrition goals set during dietitian appointment.;Long Term Goal: Adherence to prescribed nutrition plan.             Nutrition  Assessments:  MEDIFICTS Score Key: >=70 Need to make dietary changes  40-70 Heart Healthy Diet <= 40 Therapeutic Level Cholesterol Diet  Flowsheet Row Cardiac Rehab from 09/16/2023 in [facility] Cardiac and Pulmonary Rehab  Picture Your Plate Total Score on Admission 67  Picture Your Plate Total Score on Discharge 70      Picture Your Plate Scores: <16 Unhealthy dietary pattern with much room for improvement. 41-50 Dietary pattern unlikely to meet recommendations for good health and room for improvement. 51-60 More healthful dietary pattern, with some room for improvement.  >60 Healthy dietary pattern, although there may be some specific behaviors that could be improved.    Nutrition Goals Re-Evaluation:  Nutrition Goals Re-Evaluation     Row Name 06/15/23 1043 07/20/23 0947 08/12/23 0923 09/14/23 0956       Goals   Comment Kaevion states that his wife has helped him with his diet. He is working on getting more protein primarily through chicken and has cut out red meats from his diet at this time. He is also working on drinking plenty of water throughout the day. Kazuki states that he has been doing well with his eating habits. He continues to cut out red meat and get primarily chicken as his protein source. His wife has been a major help when it comes to his diet. He also continues to work on dietary changes discussed with RD. He feels he still working on nutrition goals of cutting back on saturated fat while keeping protein up. Spoke about plant based protein to help him meet protein goals and increase fiber intake. He is happy with his weight, encouraged nutrient dense foods rather than junk and to avoid sugary beverages. Thaison states that he is still cutting back on saturated fats. He states that he and his wife are working on increasing protein without eating red meat due to his own research. He is avoiding soft drinks, he drinks the occasional tea, but is mostly drinking water.    Expected  Outcome Short: Continue to work on protein intake. Long: Continue to work on heart healthy eating patterns discussed with RD. Short: Continue to work on protein intake. Long: Continue to work on heart healthy eating patterns discussed with RD. STG: COntinue to reduce saturated fat and keep protein intake up. LTG: maintain weight and follow heart healthy diet Short: Continue to increase protein intake and avoid soft drinks. Long: Continue heart healthy eating patterns as discussed with RD.             Nutrition Goals Discharge (Final Nutrition Goals Re-Evaluation):  Nutrition Goals Re-Evaluation - 09/14/23 0956       Goals   Comment Juston states that he is still cutting back on saturated fats. He states that he and his wife are working on increasing protein without eating red meat due to his own research. He is avoiding soft drinks, he drinks the occasional tea, but is mostly drinking water.    Expected Outcome Short: Continue to increase protein intake and avoid soft drinks. Long: Continue heart healthy eating patterns as discussed with RD.             Psychosocial: Target Goals: Acknowledge presence or absence of significant depression and/or stress, maximize coping skills, provide positive support system. Participant is able to verbalize types and ability to use techniques and skills needed for reducing stress and depression.   Education: Stress, Anxiety, and Depression - Group verbal and visual presentation to define topics covered.  Reviews how body is  impacted by stress, anxiety, and depression.  Also discusses healthy ways to reduce stress and to treat/manage anxiety and depression.  Written material given at graduation. Flowsheet Row Cardiac Rehab from 05/27/2023 in [facility] Cardiac and Pulmonary Rehab  Date 05/27/23  Educator SB  Instruction Review Code 1- [employer] Understanding       Education: Sleep Hygiene -Provides group verbal and written instruction about how sleep  can affect your health.  Define sleep hygiene, discuss sleep cycles and impact of sleep habits. Review good sleep hygiene tips.    Initial Review & Psychosocial Screening:  Initial Psych Review & Screening - 05/06/23 1337       Initial Review   Current issues with None Identified      Family Dynamics   Good Support System? Yes   wife, friends     Barriers   Psychosocial barriers to participate in program There are no identifiable barriers or psychosocial needs.      Screening Interventions   Interventions Encouraged to exercise;To provide support and resources with identified psychosocial needs    Expected Outcomes Short Term goal: Utilizing psychosocial counselor, staff and physician to assist with identification of specific Stressors or current issues interfering with healing process. Setting desired goal for each stressor or current issue identified.;Long Term Goal: Stressors or current issues are controlled or eliminated.;Short Term goal: Identification and review with participant of any Quality of Life or Depression concerns found by scoring the questionnaire.;Long Term goal: The participant improves quality of Life and PHQ9 Scores as seen by post scores and/or verbalization of changes             Quality of Life Scores:   Quality of Life - 09/16/23 0949       Quality of Life Scores   Health/Function Pre 22.7 %    Health/Function Post 22.8 %    Health/Function % Change 0.44 %    Socioeconomic Pre 23.56 %    Socioeconomic Post 26.25 %    Socioeconomic % Change  11.42 %    Psych/Spiritual Pre 24.86 %    Psych/Spiritual Post 27.43 %    Psych/Spiritual % Change 10.34 %    Family Pre 27.6 %    Family Post 28.8 %    Family % Change 4.35 %    GLOBAL Pre 24.03 %    GLOBAL Post 25.37 %    GLOBAL % Change 5.58 %            Scores of 19 and below usually indicate a poorer quality of life in these areas.  A difference of  2-3 points is a clinically meaningful difference.   A difference of 2-3 points in the total score of the Quality of Life Index has been associated with significant improvement in overall quality of life, self-image, physical symptoms, and general health in studies assessing change in quality of life.  PHQ-9: Review Flowsheet  More data exists      09/16/2023 06/25/2023 05/25/2023 06/22/2022 03/04/2022  Depression screen PHQ 2/9  Decreased Interest 0 0 0 0 0  Down, Depressed, Hopeless 0 0 0 0 0  PHQ - 2 Score 0 0 0 0 0  Altered sleeping 0 0 1 - -  Tired, decreased energy 1 0 1 - -  Change in appetite 0 0 0 - -  Feeling bad or failure about yourself  0 0 0 - -  Trouble concentrating 0 0 0 - -  Moving slowly or fidgety/restless 0 0 0 - -  Suicidal thoughts 0 0 0 - -  PHQ-9 Score 1 0 2 - -  Difficult doing work/chores Not difficult at all Not difficult at all Not difficult at all - -   Interpretation of Total Score  Total Score Depression Severity:  1-4 = Minimal depression, 5-9 = Mild depression, 10-14 = Moderate depression, 15-19 = Moderately severe depression, 20-27 = Severe depression   Psychosocial Evaluation and Intervention:  Psychosocial Evaluation - 05/06/23 1355       Psychosocial Evaluation & Interventions   Interventions Encouraged to exercise with the program and follow exercise prescription    Comments Dusty is coming to cardiac rehab with stable angina. He meets with his cardiologist this week to discuss options for his blockage. He currently does not struggle with chest pain but knows what medication to take if he starts. 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 His wife and friends are a great support system for him. He does have leg pain with walking which can hinder his activites but he states that he just rests some and then gets back to what he was doing. He mentions he has a positive outlook and is ready to start the program to learn what he can/should be doing.    Expected Outcomes Short: attend cardiac rehab for  education and exercise. Long: Develop and maintain positive self care habits.    Continue Psychosocial Services  Follow up required by staff             Psychosocial Re-Evaluation:  Psychosocial Re-Evaluation     Row Name 06/15/23 1039 06/24/23 1027 07/20/23 0945 08/12/23 0921 09/14/23 0954     Psychosocial Re-Evaluation   Current issues with None Identified;Current Sleep Concerns -- None Identified None Identified None Identified   Comments Ki reports no major stressors at this time. 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 He also states that exercise has beneficial for helping him learn his limits. His wife and friends make up a good support system for him. He has not been sleeping well as he wakes up throughout the night, but he states this is his normal. Pasqual states he has no concerns.  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 His wife is a great support to him. Lorenso states that he has no major stressors at this time. He also continues to sleep well. His wife has been a good support system for him. He enjoys working in his shop for stress relief. He states he is doing well with sleep, currently getting ~7hrs each night. Wakes up a few times to use restroom. No stressors, anxeity or depression currently. He has his wife at home to help him if any of these issues should arise. He states that he has no major stressors at this time. He states that he has no issues sleeping at this time. He reports that his wife is a good support system for him.   Expected Outcomes Short: Continue to relieve stress through healthy avenues. Long: Continue to maintain positive outlook. STG Camron to continue to work in his workshop, to rec Short: Continue to work in shop for stress relief. Long: Maintain positive outlook. STG: continue to use support system and hobbies of enjoyment like working in shop to help relieve stress. LTG: maintain positive outlook on health  outcomes and daily life Short: Continue to relieve stress through healthy avenues. Long: Continue to maintain positive outlook.   Interventions Encouraged to  attend Cardiac Rehabilitation for the exercise -- Encouraged to attend Cardiac Rehabilitation for the exercise Encouraged to attend Cardiac Rehabilitation for the exercise Encouraged to attend Cardiac Rehabilitation for the exercise   Continue Psychosocial Services  Follow up required by staff -- Follow up required by staff Follow up required by staff Follow up required by staff            Psychosocial Discharge (Final Psychosocial Re-Evaluation):  Psychosocial Re-Evaluation - 09/14/23 0954       Psychosocial Re-Evaluation   Current issues with None Identified    Comments He states that he has no major stressors at this time. He states that he has no issues sleeping at this time. He reports that his wife is a good support system for him.    Expected Outcomes Short: Continue to relieve stress through healthy avenues. Long: Continue to maintain positive outlook.    Interventions Encouraged to attend Cardiac Rehabilitation for the exercise    Continue Psychosocial Services  Follow up required by staff             Vocational Rehabilitation: Provide vocational rehab assistance to qualifying candidates.   Vocational Rehab Evaluation & Intervention:  Vocational Rehab - 05/06/23 1336       Initial Vocational Rehab Evaluation & Intervention   Assessment shows need for Vocational Rehabilitation No             Education: Education Goals: Education classes will be provided on a variety of topics geared toward better understanding of heart health and risk factor modification. Participant will state understanding/return demonstration of topics presented as noted by education test scores.  Learning Barriers/Preferences:  Learning Barriers/Preferences - 05/06/23 1336       Learning Barriers/Preferences   Learning Barriers None     Learning Preferences None             General Cardiac Education Topics:  AED/CPR: - Group verbal and written instruction with the use of models to demonstrate the basic use of the AED with the basic ABC's of resuscitation.   Anatomy and Cardiac Procedures: - Group verbal and visual presentation and models provide information about basic cardiac anatomy and function. Reviews the testing methods done to diagnose heart disease and the outcomes of the test results. Describes the treatment choices: Medical Management, Angioplasty, or Coronary Bypass Surgery for treating various heart conditions including Myocardial Infarction, Angina, Valve Disease, and Cardiac Arrhythmias.  Written material given at graduation.   Medication Safety: - Group verbal and visual instruction to review commonly prescribed medications for heart and lung disease. Reviews the medication, class of the drug, and side effects. Includes the steps to properly store meds and maintain the prescription regimen.  Written material given at graduation.   Intimacy: - Group verbal instruction through game format to discuss how heart and lung disease can affect sexual intimacy. Written material given at graduation..   Know Your Numbers and Heart Failure: - Group verbal and visual instruction to discuss disease risk factors for cardiac and pulmonary disease and treatment options.  Reviews associated critical values for Overweight/Obesity, Hypertension, Cholesterol, and Diabetes.  Discusses basics of heart failure: signs/symptoms and treatments.  Introduces Heart Failure Zone chart for action plan for heart failure.  Written material given at graduation.   Infection Prevention: - Provides verbal and written material to individual with discussion of infection control including proper hand washing and proper equipment cleaning during exercise session. Flowsheet Row Cardiac Rehab from 05/27/2023 in [facility] Cardiac and Pulmonary  Rehab  Date 05/20/23  Educator MB  Instruction Review Code 1- Verbalizes Understanding       Falls Prevention: - Provides verbal and written material to individual with discussion of falls prevention and safety. Flowsheet Row Cardiac Rehab from 05/27/2023 in [facility] Cardiac and Pulmonary Rehab  Date 05/20/23  Educator MB  Instruction Review Code 1- Verbalizes Understanding       Other: -Provides group and verbal instruction on various topics (see comments)   Knowledge Questionnaire Score:  Knowledge Questionnaire Score - 09/16/23 0940       Knowledge Questionnaire Score   Pre Score 24/26    Post Score 25/26             Core Components/Risk Factors/Patient Goals at Admission:  Personal Goals and Risk Factors at Admission - 05/20/23 1501       Core Components/Risk Factors/Patient Goals on Admission    Weight Management Yes;Weight Maintenance    Intervention Weight Management: Develop a combined nutrition and exercise program designed to reach desired caloric intake, while maintaining appropriate intake of nutrient and fiber, sodium and fats, and appropriate energy expenditure required for the weight goal.;Weight Management: Provide education and appropriate resources to help participant work on and attain dietary goals.;Weight Management/Obesity: Establish reasonable short term and long term weight goals.    Admit Weight 164 lb 6.4 oz (74.6 kg)    Goal Weight: Short Term 164 lb 6.4 oz (74.6 kg)    Goal Weight: Long Term 164 lb 6.4 oz (74.6 kg)    Expected Outcomes Short Term: Continue to assess and modify interventions until short term weight is achieved;Long Term: Adherence to nutrition and physical activity/exercise program aimed toward attainment of established weight goal;Weight Maintenance: Understanding of the daily nutrition guidelines, which includes 25-35% calories from fat, 7% or less cal from saturated fats, less than 200mg  cholesterol, less than 1.5gm of sodium, &  5 or more servings of fruits and vegetables daily;Understanding recommendations for meals to include 15-35% energy as protein, 25-35% energy from fat, 35-60% energy from carbohydrates, less than 200mg  of dietary cholesterol, 20-35 gm of total fiber daily;Understanding of distribution of calorie intake throughout the day with the consumption of 4-5 meals/snacks    Hypertension Yes    Intervention Provide education on lifestyle modifcations including regular physical activity/exercise, weight management, moderate sodium restriction and increased consumption of fresh fruit, vegetables, and low fat dairy, alcohol moderation, and smoking cessation.;Monitor prescription use compliance.    Expected Outcomes Short Term: Continued assessment and intervention until BP is < 140/33mm HG in hypertensive participants. < 130/29mm HG in hypertensive participants with diabetes, heart failure or chronic kidney disease.;Long Term: Maintenance of blood pressure at goal levels.    Lipids Yes    Intervention Provide education and support for participant on nutrition & aerobic/resistive exercise along with prescribed medications to achieve LDL 70mg , HDL >40mg .    Expected Outcomes Short Term: Participant states understanding of desired cholesterol values and is compliant with medications prescribed. Participant is following exercise prescription and nutrition guidelines.;Long Term: Cholesterol controlled with medications as prescribed, with individualized exercise RX and with personalized nutrition plan. Value goals: LDL < 70mg , HDL > 40 mg.             Education:Diabetes - Individual verbal and written instruction to review signs/symptoms of diabetes, desired ranges of glucose level fasting, after meals and with exercise. Acknowledge that pre and post exercise glucose checks will be done for 3 sessions at entry of program.   Core  Components/Risk Factors/Patient Goals Review:   Goals and Risk Factor Review     Row  Name 06/15/23 1045 06/24/23 1325 07/20/23 0950 08/12/23 0925 09/14/23 0957     Core Components/Risk Factors/Patient Goals Review   Personal Goals Review Weight Management/Obesity;Hypertension Weight Management/Obesity;Hypertension;Lipids Weight Management/Obesity;Hypertension;Lipids Hypertension;Weight Management/Obesity;Lipids Hypertension;Weight Management/Obesity;Lipids   Review Trenton is doing well and would like to continue to maintain his weight between 160-165 lbs. He also owns a BP cuff and uses it to check his BP about once or twice a week. He reports that his BP has stayed within normal ranges. He continues to take all of his medications as prescribed. Kauan continues to work on maintaining his weight around 106-165 lbs. His wife is working to help him eat healthy. More fruit, no red meat.  He is compliant with his medications, working on his exercise progression while managing his angina symptoms and managing his hypertension and cholesterol.  His BP is higher on arrival and in a good range at discharge. We reviewed that thie exit BP can become his entry BP as he continues with meds, exercise and nutrition plan. He is compliant withhis choleaterol med. Khoen continues to work on maintaining his weight between 160-165 lbs. He states that he is doing well with weight maintance and weighed in today at 164.5 lb. He also continues to check his BP at home occasionally and states that it has stayed within normal ranges. He also continues to take all his medications as prescribed. He is focusing on maintianing his weight of ~160-165lbs. He his happy with this weight. He check his BP at home, 120-130/75-85. He is taking his medications as precribed and working on making exercise and hearth healthy dietary changes more of a habit to improve LDL and HDL Yamir states that he is still trying to maintain his weight right around 160 lbs. He weighed in today at 162 lb. He is checking his BP a few times a week at  home with his personal cuff. He reports that his BP has stayed within normal ranges. He also states that he is taking all of his meds as prescribed.   Expected Outcomes Short: Continue to check BP regularly. Long: Continue to manage lifestyle risk factors. Short: Continue to check BP regularly. and continue to work on exercise progression as limited by anginal symptoms.  Long: Continue to manage lifestyle risk factors. Short: Continue to check BP regularly. Long: Continue to monitor lifestyle risk factors. STG: Continue to check BP at home take meds. LTG maintain weight follow heart healthy lifestyle Short: Continue to check BP at home and take all meds as prescribed. Long: Continue to manage lifestyle risk factors.            Core Components/Risk Factors/Patient Goals at Discharge (Final Review):   Goals and Risk Factor Review - 09/14/23 0957       Core Components/Risk Factors/Patient Goals Review   Personal Goals Review Hypertension;Weight Management/Obesity;Lipids    Review Fedrick states that he is still trying to maintain his weight right around 160 lbs. He weighed in today at 162 lb. He is checking his BP a few times a week at home with his personal cuff. He reports that his BP has stayed within normal ranges. He also states that he is taking all of his meds as prescribed.    Expected Outcomes Short: Continue to check BP at home and take all meds as prescribed. Long: Continue to manage lifestyle risk factors.  ITP Comments:  ITP Comments     Row Name 05/06/23 1343 05/20/23 1451 05/25/23 0953 06/16/23 0851 07/07/23 0929   ITP Comments Initial phone call completed. Diagnosis can be found in [facility] 8/30. EP Orientation scheduled for Thursday 10/3 at 9:30. Completed and gym orientation. Initial ITP created and sent for review to Dr. Bethann Punches, Medical Director. First full day of exercise!  Patient was oriented to gym and equipment including functions, settings, policies,  and procedures.  Patient's individual exercise prescription and treatment plan were reviewed.  All starting workloads were established based on the results of the 6 minute walk test done at initial orientation visit.  The plan for exercise progression was also introduced and progression will be customized based on patient's performance and goals. 30 Day review completed. Medical Director ITP review done, changes made as directed, and signed approval by Medical Director.    new to program 30 Day review completed. Medical Director ITP review done, changes made as directed, and signed approval by Medical Director.    Row Name 08/04/23 1144 09/01/23 1258 09/29/23 0816 10/14/23 0944     ITP Comments 30 Day review completed. Medical Director ITP review done, changes made as directed, and signed approval by Medical Director. 30 Day review completed. Medical Director ITP review done, changes made as directed, and signed approval by Medical Director. 30 Day review completed. Medical Director ITP review done, changes made as directed, and signed approval by Medical Director. Delmus graduated today from  rehab with 36 sessions completed.  Details of the patient's exercise prescription and what He needs to do in order to continue the prescription and progress were discussed with patient.  Patient was given a copy of prescription and goals.  Patient verbalized understanding. Cloud plans to continue to exercise by using his recumbent bike at home.             Comments: Discharge ITP

## 2023-11-17 ENCOUNTER — Ambulatory Visit: Payer: Medicare Other | Admitting: Internal Medicine

## 2023-11-17 ENCOUNTER — Encounter: Payer: Self-pay | Admitting: Internal Medicine

## 2023-11-17 VITALS — BP 138/84 | HR 90 | Temp 98.0°F | Resp 16 | Ht 67.5 in | Wt 158.0 lb

## 2023-11-17 DIAGNOSIS — R1030 Lower abdominal pain, unspecified: Secondary | ICD-10-CM

## 2023-11-17 DIAGNOSIS — D649 Anemia, unspecified: Secondary | ICD-10-CM

## 2023-11-17 DIAGNOSIS — E7800 Pure hypercholesterolemia, unspecified: Secondary | ICD-10-CM | POA: Diagnosis not present

## 2023-11-17 DIAGNOSIS — I70219 Atherosclerosis of native arteries of extremities with intermittent claudication, unspecified extremity: Secondary | ICD-10-CM | POA: Diagnosis not present

## 2023-11-17 DIAGNOSIS — I10 Essential (primary) hypertension: Secondary | ICD-10-CM

## 2023-11-17 DIAGNOSIS — Z85819 Personal history of malignant neoplasm of unspecified site of lip, oral cavity, and pharynx: Secondary | ICD-10-CM

## 2023-11-17 DIAGNOSIS — K219 Gastro-esophageal reflux disease without esophagitis: Secondary | ICD-10-CM

## 2023-11-17 DIAGNOSIS — G40909 Epilepsy, unspecified, not intractable, without status epilepticus: Secondary | ICD-10-CM

## 2023-11-17 DIAGNOSIS — I700 Atherosclerosis of aorta: Secondary | ICD-10-CM

## 2023-11-17 DIAGNOSIS — I25119 Atherosclerotic heart disease of native coronary artery with unspecified angina pectoris: Secondary | ICD-10-CM

## 2023-11-17 DIAGNOSIS — F419 Anxiety disorder, unspecified: Secondary | ICD-10-CM

## 2023-11-17 DIAGNOSIS — I739 Peripheral vascular disease, unspecified: Secondary | ICD-10-CM

## 2023-11-17 LAB — CBC WITH DIFFERENTIAL/PLATELET
Basophils Absolute: 0 10*3/uL (ref 0.0–0.1)
Basophils Relative: 0.6 % (ref 0.0–3.0)
Eosinophils Absolute: 0.1 10*3/uL (ref 0.0–0.7)
Eosinophils Relative: 1.2 % (ref 0.0–5.0)
HCT: 39.1 % (ref 39.0–52.0)
Hemoglobin: 13.3 g/dL (ref 13.0–17.0)
Lymphocytes Relative: 19.7 % (ref 12.0–46.0)
Lymphs Abs: 1.4 10*3/uL (ref 0.7–4.0)
MCHC: 34 g/dL (ref 30.0–36.0)
MCV: 85.4 fl (ref 78.0–100.0)
Monocytes Absolute: 0.6 10*3/uL (ref 0.1–1.0)
Monocytes Relative: 8.8 % (ref 3.0–12.0)
Neutro Abs: 5.1 10*3/uL (ref 1.4–7.7)
Neutrophils Relative %: 69.7 % (ref 43.0–77.0)
Platelets: 447 10*3/uL — ABNORMAL HIGH (ref 150.0–400.0)
RBC: 4.58 Mil/uL (ref 4.22–5.81)
RDW: 13.9 % (ref 11.5–15.5)
WBC: 7.4 10*3/uL (ref 4.0–10.5)

## 2023-11-17 LAB — HEPATIC FUNCTION PANEL
ALT: 23 U/L (ref 0–53)
AST: 23 U/L (ref 0–37)
Albumin: 4.4 g/dL (ref 3.5–5.2)
Alkaline Phosphatase: 67 U/L (ref 39–117)
Bilirubin, Direct: 0.1 mg/dL (ref 0.0–0.3)
Total Bilirubin: 0.5 mg/dL (ref 0.2–1.2)
Total Protein: 7.3 g/dL (ref 6.0–8.3)

## 2023-11-17 LAB — LIPID PANEL
Cholesterol: 135 mg/dL (ref 0–200)
HDL: 44.7 mg/dL (ref >39.00)
LDL Cholesterol: 64 mg/dL (ref 0–99)
NonHDL: 90.05
Total CHOL/HDL Ratio: 3
Triglycerides: 129 mg/dL (ref 0.0–149.0)
VLDL: 25.8 mg/dL (ref 0.0–40.0)

## 2023-11-17 LAB — BASIC METABOLIC PANEL WITH GFR
BUN: 15 mg/dL (ref 6–23)
CO2: 28 meq/L (ref 19–32)
Calcium: 9.9 mg/dL (ref 8.4–10.5)
Chloride: 101 meq/L (ref 96–112)
Creatinine, Ser: 1.08 mg/dL (ref 0.40–1.50)
GFR: 73.1 mL/min (ref >60.00)
Glucose, Bld: 97 mg/dL (ref 70–99)
Potassium: 4.8 meq/L (ref 3.5–5.1)
Sodium: 137 meq/L (ref 135–145)

## 2023-11-17 NOTE — Assessment & Plan Note (Addendum)
 Intermittent abdominal pain as outlined. Discussed. Has stopped after he stopped eating an increased amount of nuts. No vomiting or diarrhea. Appetite is good. Discussed further w/up. Hold on further w/up since improved/resolved. Follow. Notify me or be evaluated if symptoms return.

## 2023-11-17 NOTE — Progress Notes (Signed)
 Subjective:    [name], male    DOB: Apr 10, 1961, 63 y.o.   MRN: [id number]  Patient here for  Chief Complaint  Patient presents with   Medical Management of Chronic Issues    HPI Here for a scheduled follow up - f/u regarding hypertension, hypercholesterolemia and vascular disease. Saw neurology 02/01/23 - f/u history of seizure.  Recommended tapering lamictal. EEG - per report ok.  Off lamictal. F/u one year. Followed by hematology for IDA. Had capsule endoscopy 02/2022 - normal small intestine with no evidence of small bowels bleeding or lesions. F/u with Dr Smith Robert 05/19/23 - ferritin 15 with iron saturation 15%. Iron infusion 05/31/23 and 06/07/23. MRI of the cervical spine dated 05/31/2023 revealed moderate bilateral foraminal stenosis at C4-5. Recommended ESI. S/p ESI 07/02/23. Had f/u 08/03/23. Recommended continuing tylenol prn and home exercise. Had f/u with cardiology 07/07/23 - f/u triple vessel CAD. Continue aspirin, pravastatin, PCSK9 inhibitor. Went to cardiac rehab. Has "graduated" from rehab. Is exercising - riding his bike. No chest pain or sob reported. Does report that over the last few weeks he has had increased intermittent episodes of abdominal discomfort. Describes as - lower abdominal pain/discomfort. May last a couple of hours. Will notice when first gets up and may worsen after eating breakfast. His last episode was Monday 11/08/23. He stopped eating an increased amount of nuts (which he had increased intake recently). He has not had any abdominal discomfort since stopping the nuts. No pain. No bowel change. No nausea or vomiting. No blood in stool. Appetite normal.    Past Medical History:  Diagnosis Date   Basal cell carcinoma (BCC) of back    Basal cell carcinoma (BCC) of nasal sidewall    Dizziness    GERD (gastroesophageal reflux disease)    H/O blood clots    Headache    migraines   Hypercholesterolemia    Hypertension    hx of HBP readings    Metastatic squamous cell carcinoma 2018   bx cervical lymph node   Seizures (HCC) 2021   Tonsillar cancer (HCC)    Vascular abnormality    VTE (venous thromboembolism) 2018   associated with chemotherapy   Past Surgical History:  Procedure Laterality Date   COLONOSCOPY     COLONOSCOPY N/A 05/08/2021   Procedure: COLONOSCOPY;  Surgeon: Jaynie Collins, DO;  Location: [facility] ENDOSCOPY;  Service: Gastroenterology;  Laterality: N/A;   endovascular balloon angioplasty femoral/popliteal artery w/stent Left 11/08/2017   ESOPHAGOGASTRODUODENOSCOPY N/A 05/08/2021   Procedure: ESOPHAGOGASTRODUODENOSCOPY (EGD);  Surgeon: Jaynie Collins, DO;  Location: [facility] ENDOSCOPY;  Service: Gastroenterology;  Laterality: N/A;   ESOPHAGOGASTRODUODENOSCOPY (EGD) WITH PROPOFOL N/A 05/24/2015   Procedure: ESOPHAGOGASTRODUODENOSCOPY (EGD) WITH PROPOFOL;  Surgeon: Scot Jun, MD;  Location: [facility] ENDOSCOPY;  Service: Endoscopy;  Laterality: N/A;   FEMORAL-FEMORAL BYPASS GRAFT     IR IMAGING GUIDED PORT INSERTION  12/09/2016   LEFT HEART CATH AND CORONARY ANGIOGRAPHY N/A 12/25/2019   Procedure: LEFT HEART CATH AND CORONARY ANGIOGRAPHY;  Surgeon: Lamar Blinks, MD;  Location: [facility] INVASIVE CV LAB;  Service: Cardiovascular;  Laterality: N/A;   SAVORY DILATION N/A 05/24/2015   Procedure: SAVORY DILATION;  Surgeon: Scot Jun, MD;  Location: [facility] ENDOSCOPY;  Service: Endoscopy;  Laterality: N/A;   skin cancer removed from face   2024   Family History  Problem Relation Age of Onset   Hyperlipidemia Mother    Heart disease Mother    Hypertension Mother  Alzheimer's disease Mother    Prostate cancer Father    Stroke Father    Social History   Socioeconomic History   Marital status: Married    Spouse name: Not on file   Number of children: Not on file   Years of education: Not on file   Highest education level: Not on file  Occupational History   Occupation: Curator  Tobacco Use    Smoking status: Former    Current packs/day: 0.00    Types: Cigarettes    Quit date: 11/11/1996    Years since quitting: 27.0   Smokeless tobacco: Never  Vaping Use   Vaping status: Never Used  Substance and Sexual Activity   Alcohol use: Yes    Comment: occasional   Drug use: No   Sexual activity: Yes  Other Topics Concern   Not on file  Social History Narrative   Married   Social Drivers of Health   Financial Resource Strain: Low Risk  (06/25/2023)   Overall Financial Resource Strain (CARDIA)    Difficulty of Paying Living Expenses: Not hard at all  Food Insecurity: No Food Insecurity (06/25/2023)   Hunger Vital Sign    Worried About Running Out of Food in the Last Year: Never true    Ran Out of Food in the Last Year: Never true  Transportation Needs: No Transportation Needs (06/25/2023)   PRAPARE - Administrator, Civil Service (Medical): No    Lack of Transportation (Non-Medical): No  Physical Activity: Insufficiently Active (06/25/2023)   Exercise Vital Sign    Days of Exercise per Week: 3 days    Minutes of Exercise per Session: 30 min  Stress: No Stress Concern Present (06/25/2023)   [employer] of Occupational Health - Occupational Stress Questionnaire    Feeling of Stress : Only a little  Social Connections: Moderately Isolated (06/25/2023)   Social Connection and Isolation Panel [NHANES]    Frequency of Communication with Friends and Family: More than three times a week    Frequency of Social Gatherings with Friends and Family: Three times a week    Attends Religious Services: Never    Active Member of Clubs or Organizations: No    Attends Banker Meetings: Never    Marital Status: Married     Review of Systems  Constitutional:  Negative for appetite change and unexpected weight change.  HENT:  Negative for congestion and sinus pressure.   Respiratory:  Negative for cough, chest tightness and shortness of breath.   Cardiovascular:   Negative for chest pain, palpitations and leg swelling.  Gastrointestinal:  Negative for diarrhea, nausea and vomiting.       No abdominal pain currently.   Genitourinary:  Negative for difficulty urinating and dysuria.  Musculoskeletal:  Negative for joint swelling and myalgias.  Skin:  Negative for color change and rash.  Neurological:  Negative for dizziness and headaches.  Psychiatric/Behavioral:  Negative for agitation and dysphoric mood.        Objective:     BP 138/84   Pulse 90   Temp 98 F (36.7 C)   Resp 16   Ht 5' 7.5" (1.715 m)   Wt 158 lb (71.7 kg)   SpO2 98%   BMI 24.38 kg/m  Wt Readings from Last 3 Encounters:  11/17/23 158 lb (71.7 kg)  09/14/23 162 lb (73.5 kg)  07/12/23 166 lb (75.3 kg)    Physical Exam Vitals reviewed.  Constitutional:  General: He is not in acute distress.    Appearance: Normal appearance. He is well-developed.  HENT:     Head: Normocephalic and atraumatic.     Right Ear: External ear normal.     Left Ear: External ear normal.     Mouth/Throat:     Pharynx: No oropharyngeal exudate or posterior oropharyngeal erythema.  Eyes:     General: No scleral icterus.       Right eye: No discharge.        Left eye: No discharge.     Conjunctiva/sclera: Conjunctivae normal.  Cardiovascular:     Rate and Rhythm: Normal rate and regular rhythm.  Pulmonary:     Effort: Pulmonary effort is normal. No respiratory distress.     Breath sounds: Normal breath sounds.  Abdominal:     General: Bowel sounds are normal.     Palpations: Abdomen is soft.     Tenderness: There is no abdominal tenderness.  Musculoskeletal:        General: No swelling or tenderness.     Cervical back: Neck supple. No tenderness.  Lymphadenopathy:     Cervical: No cervical adenopathy.  Skin:    Findings: No erythema or rash.  Neurological:     Mental Status: He is alert.  Psychiatric:        Mood and Affect: Mood normal.        Behavior: Behavior normal.          Outpatient Encounter Medications as of 11/17/2023  Medication Sig   apixaban (ELIQUIS) 5 MG TABS tablet Take 1 tablet (5 mg total) by mouth every 12 (twelve) hours.   aspirin EC 81 MG tablet Take 81 mg by mouth daily. Swallow whole.   busPIRone (BUSPAR) 5 MG tablet Take 1 tablet (5 mg total) by mouth daily as needed.   doxycycline (VIBRA-TABS) 100 MG tablet Take 1 tablet (100 mg total) by mouth 2 (two) times daily.   lisinopril (ZESTRIL) 40 MG tablet Take 1 tablet (40 mg total) by mouth daily.   Multiple Vitamin (MULTIVITAMIN) capsule Take by mouth.   nitroGLYCERIN (NITROSTAT) 0.4 MG SL tablet Place 0.4 mg under the tongue every 5 (five) minutes as needed for chest pain.   omeprazole (PRILOSEC) 20 MG capsule Take 1 capsule (20 mg total) by mouth 2 (two) times daily.   pravastatin (PRAVACHOL) 40 MG tablet Take 1 tablet by mouth daily.   REPATHA SURECLICK 140 MG/ML SOAJ INJECT 140 MG INTO THE SKIN EVERY 14 DAYS   No facility-administered encounter medications on file as of 11/17/2023.     Lab Results  Component Value Date   WBC 7.4 11/17/2023   HGB 13.3 11/17/2023   HCT 39.1 11/17/2023   PLT 447.0 (H) 11/17/2023   GLUCOSE 97 11/17/2023   CHOL 135 11/17/2023   TRIG 129.0 11/17/2023   HDL 44.70 11/17/2023   LDLDIRECT 163.0 09/08/2022   LDLCALC 64 11/17/2023   ALT 23 11/17/2023   AST 23 11/17/2023   NA 137 11/17/2023   K 4.8 11/17/2023   CL 101 11/17/2023   CREATININE 1.08 11/17/2023   BUN 15 11/17/2023   CO2 28 11/17/2023   TSH 2.15 08/06/2023   PSA 1.55 08/06/2023   INR 1.1 12/23/2019   HGBA1C 6.0 (H) 05/23/2019    MR CERVICAL SPINE WO CONTRAST Result Date: 06/15/2023 CLINICAL DATA:  Cervical radiculitis. EXAM: MRI CERVICAL SPINE WITHOUT CONTRAST TECHNIQUE: Multiplanar, multisequence MR imaging of the cervical spine was performed. No intravenous contrast was  administered. COMPARISON:  03/30/2021 FINDINGS: Alignment: Mild C2-3, C3-4 and C7-T1 degenerative  anterolisthesis. Vertebrae: No fracture, evidence of discitis, or bone lesion. Cord: Normal signal and morphology. Posterior Fossa, vertebral arteries, paraspinal tissues: Negative. Disc levels: Challenging assessment of the foramina on axial images especially due to blooming on gradient series. C2-3: Degenerative facet spurring asymmetric to the left. C3-4: Degenerative facet spurring with mild anterolisthesis. Small uncovertebral spurs. Mild left foraminal narrowing by T2 weighted imaging. C4-5: Disc space narrowing with uncovertebral ridging. Degenerative facet spurring on both sides. Mild or moderate foraminal narrowing based on axial T2 weighted imaging C5-6: Mild disc bulging.  No neural impingement. C6-7: Mild disc bulging.  No neural impingement. C7-T1:Degenerative facet spurring asymmetric to the right. IMPRESSION: 1. Generalized cervical spine degeneration similar to 2022, as described. 2. Mild to moderate left foraminal narrowing at C4-5. Widely patent spinal canal. Electronically Signed   By: Tiburcio Pea M.D.   On: 06/15/2023 07:53       Assessment & Plan:  Hypercholesterolemia Assessment & Plan: Continue pravastatin and repatha. Low cholesterol diet and exercise.  Follow lipid panel and liver function tests.   Orders: -     Lipid panel -     Hepatic function panel -     Basic metabolic panel with GFR  Essential hypertension, benign Assessment & Plan: Continue lisinopril.  Blood pressure as outlined. Continue to follow pressures.  Follow metabolic panel.  No changes today in medication.   Orders: -     Basic metabolic panel with GFR  Lower abdominal pain Assessment & Plan: Intermittent abdominal pain as outlined. Discussed. Has stopped after he stopped eating an increased amount of nuts. No vomiting or diarrhea. Appetite is good. Discussed further w/up. Hold on further w/up since improved/resolved. Follow. Notify me or be evaluated if symptoms return.   Orders: -     CBC  with Differential/Platelet  Anemia, unspecified type Assessment & Plan: Followed by hematology for IDA. Had capsule endoscopy 02/2022 - normal small intestine with no evidence of small bowels bleeding or lesions. Had f/u with Dr Smith Robert 09/18/22.  F/u with Dr Smith Robert 05/19/23 - ferritin 15 with iron saturation 15%. Iron infusion 05/31/23 and 06/07/23. Continue f/u with hematology.    Anxiety Assessment & Plan: Has buspar to take prn. Follow. Overall appears to be doing well.    Aortic occlusion [facility]) Assessment & Plan: S/p fem fem bypass.  Followed by [facility].  On eliquis.  Continue pravastatin and repatha.    Atherosclerotic PVD with intermittent claudication (HCC) Assessment & Plan: S/p fem fem bypass.  Followed by [facility].  On eliquis.  Continue pravastatin and repatha. Continue risk factor modification.    Claudication [facility]) Assessment & Plan: Followed by vascular.  Continue risk factor modification. Stable.    Coronary artery disease involving native coronary artery of native heart with angina pectoris [facility]) Assessment & Plan: Had f/u with cardiology 07/07/23 - f/u triple vessel CAD. Continue aspirin, pravastatin, PCSK9 inhibitor. Graduated from cardiac rehab. Riding his bike. Continue risk factor modification.    Gastroesophageal reflux disease, unspecified whether esophagitis present Assessment & Plan: No upper symptoms related. Continue prilosec.    History of malignant neoplasm of oropharynx Assessment & Plan: S/p XRT and chemo.  Followed by oncology.    Seizure disorder [facility]) Assessment & Plan: No recent seizures.  Saw neurology.  S/p EEG.  Off lamictal. Doing well. Follow.       Dale Courtland, MD

## 2023-11-21 ENCOUNTER — Encounter: Payer: Self-pay | Admitting: Internal Medicine

## 2023-11-21 NOTE — Assessment & Plan Note (Signed)
 Followed by hematology for IDA. Had capsule endoscopy 02/2022 - normal small intestine with no evidence of small bowels bleeding or lesions. Had f/u with Dr Smith Robert 09/18/22.  F/u with Dr Smith Robert 05/19/23 - ferritin 15 with iron saturation 15%. Iron infusion 05/31/23 and 06/07/23. Continue f/u with hematology.

## 2023-11-21 NOTE — Assessment & Plan Note (Signed)
 Continue pravastatin and repatha. Low cholesterol diet and exercise.  Follow lipid panel and liver function tests.

## 2023-11-21 NOTE — Assessment & Plan Note (Signed)
 S/p XRT and chemo.  Followed by oncology.

## 2023-11-21 NOTE — Assessment & Plan Note (Signed)
 Had f/u with cardiology 07/07/23 - f/u triple vessel CAD. Continue aspirin, pravastatin, PCSK9 inhibitor. Graduated from cardiac rehab. Riding his bike. Continue risk factor modification.

## 2023-11-21 NOTE — Assessment & Plan Note (Signed)
 S/p fem fem bypass.  Followed by [facility].  On eliquis.  Continue pravastatin and repatha.

## 2023-11-21 NOTE — Assessment & Plan Note (Signed)
 Has buspar to take prn. Follow. Overall appears to be doing well.

## 2023-11-21 NOTE — Assessment & Plan Note (Signed)
 S/p fem fem bypass.  Followed by [facility].  On eliquis.  Continue pravastatin and repatha. Continue risk factor modification.

## 2023-11-21 NOTE — Assessment & Plan Note (Signed)
 Continue lisinopril.  Blood pressure as outlined. Continue to follow pressures.  Follow metabolic panel.  No changes today in medication.

## 2023-11-21 NOTE — Assessment & Plan Note (Signed)
 No upper symptoms related. Continue prilosec.

## 2023-11-21 NOTE — Assessment & Plan Note (Signed)
 Followed by vascular.  Continue risk factor modification. Stable.

## 2023-11-21 NOTE — Assessment & Plan Note (Signed)
 No recent seizures.  Saw neurology.  S/p EEG.  Off lamictal. Doing well. Follow.

## 2023-11-22 ENCOUNTER — Inpatient Hospital Stay: Payer: Medicare Other | Attending: Oncology

## 2023-11-22 DIAGNOSIS — D509 Iron deficiency anemia, unspecified: Secondary | ICD-10-CM

## 2023-11-22 LAB — CBC
HCT: 39 % (ref 39.0–52.0)
Hemoglobin: 13 g/dL (ref 13.0–17.0)
MCH: 28.2 pg (ref 26.0–34.0)
MCHC: 33.3 g/dL (ref 30.0–36.0)
MCV: 84.6 fL (ref 80.0–100.0)
Platelets: 417 10*3/uL — ABNORMAL HIGH (ref 150–400)
RBC: 4.61 MIL/uL (ref 4.22–5.81)
RDW: 13.4 % (ref 11.5–15.5)
WBC: 6.9 10*3/uL (ref 4.0–10.5)
nRBC: 0 % (ref 0.0–0.2)

## 2023-11-22 LAB — IRON AND TIBC
Iron: 79 ug/dL (ref 45–182)
Saturation Ratios: 38 % (ref 17.9–39.5)
TIBC: 210 ug/dL — ABNORMAL LOW (ref 250–450)
UIBC: 131 ug/dL

## 2023-11-22 LAB — FERRITIN: Ferritin: 253 ng/mL (ref 24–336)

## 2023-12-01 ENCOUNTER — Other Ambulatory Visit: Payer: Self-pay | Admitting: Internal Medicine

## 2024-01-04 DIAGNOSIS — I25118 Atherosclerotic heart disease of native coronary artery with other forms of angina pectoris: Secondary | ICD-10-CM | POA: Diagnosis not present

## 2024-01-04 DIAGNOSIS — I10 Essential (primary) hypertension: Secondary | ICD-10-CM | POA: Diagnosis not present

## 2024-01-04 DIAGNOSIS — E7849 Other hyperlipidemia: Secondary | ICD-10-CM | POA: Diagnosis not present

## 2024-01-04 DIAGNOSIS — I739 Peripheral vascular disease, unspecified: Secondary | ICD-10-CM | POA: Diagnosis not present

## 2024-01-05 DIAGNOSIS — Z85818 Personal history of malignant neoplasm of other sites of lip, oral cavity, and pharynx: Secondary | ICD-10-CM | POA: Diagnosis not present

## 2024-01-05 DIAGNOSIS — I739 Peripheral vascular disease, unspecified: Secondary | ICD-10-CM | POA: Diagnosis not present

## 2024-01-05 DIAGNOSIS — Z923 Personal history of irradiation: Secondary | ICD-10-CM | POA: Diagnosis not present

## 2024-01-05 DIAGNOSIS — I10 Essential (primary) hypertension: Secondary | ICD-10-CM | POA: Diagnosis not present

## 2024-01-05 DIAGNOSIS — K4090 Unilateral inguinal hernia, without obstruction or gangrene, not specified as recurrent: Secondary | ICD-10-CM | POA: Diagnosis not present

## 2024-01-05 DIAGNOSIS — Z9221 Personal history of antineoplastic chemotherapy: Secondary | ICD-10-CM | POA: Diagnosis not present

## 2024-01-05 DIAGNOSIS — Z955 Presence of coronary angioplasty implant and graft: Secondary | ICD-10-CM | POA: Diagnosis not present

## 2024-01-05 DIAGNOSIS — I252 Old myocardial infarction: Secondary | ICD-10-CM | POA: Diagnosis not present

## 2024-01-05 DIAGNOSIS — K8012 Calculus of gallbladder with acute and chronic cholecystitis without obstruction: Secondary | ICD-10-CM | POA: Diagnosis not present

## 2024-01-05 DIAGNOSIS — J9811 Atelectasis: Secondary | ICD-10-CM | POA: Diagnosis not present

## 2024-01-05 DIAGNOSIS — Z7901 Long term (current) use of anticoagulants: Secondary | ICD-10-CM | POA: Diagnosis not present

## 2024-01-05 DIAGNOSIS — I25110 Atherosclerotic heart disease of native coronary artery with unstable angina pectoris: Secondary | ICD-10-CM | POA: Diagnosis not present

## 2024-01-05 DIAGNOSIS — Z7982 Long term (current) use of aspirin: Secondary | ICD-10-CM | POA: Diagnosis not present

## 2024-01-05 DIAGNOSIS — Z87891 Personal history of nicotine dependence: Secondary | ICD-10-CM | POA: Diagnosis not present

## 2024-01-05 DIAGNOSIS — Z8521 Personal history of malignant neoplasm of larynx: Secondary | ICD-10-CM | POA: Diagnosis not present

## 2024-01-05 DIAGNOSIS — R1011 Right upper quadrant pain: Secondary | ICD-10-CM | POA: Diagnosis not present

## 2024-01-05 DIAGNOSIS — K828 Other specified diseases of gallbladder: Secondary | ICD-10-CM | POA: Diagnosis not present

## 2024-01-05 DIAGNOSIS — K8020 Calculus of gallbladder without cholecystitis without obstruction: Secondary | ICD-10-CM | POA: Diagnosis not present

## 2024-01-05 DIAGNOSIS — E785 Hyperlipidemia, unspecified: Secondary | ICD-10-CM | POA: Diagnosis not present

## 2024-01-05 DIAGNOSIS — R160 Hepatomegaly, not elsewhere classified: Secondary | ICD-10-CM | POA: Diagnosis not present

## 2024-01-05 DIAGNOSIS — K819 Cholecystitis, unspecified: Secondary | ICD-10-CM | POA: Diagnosis not present

## 2024-01-05 DIAGNOSIS — I2510 Atherosclerotic heart disease of native coronary artery without angina pectoris: Secondary | ICD-10-CM | POA: Diagnosis not present

## 2024-01-11 ENCOUNTER — Encounter: Payer: Self-pay | Admitting: Internal Medicine

## 2024-01-11 ENCOUNTER — Telehealth: Payer: Self-pay

## 2024-01-11 DIAGNOSIS — K819 Cholecystitis, unspecified: Secondary | ICD-10-CM | POA: Insufficient documentation

## 2024-01-11 NOTE — Transitions of Care (Post Inpatient/ED Visit) (Unsigned)
   01/11/2024  Name: Timothy Garrison MRN: [id number] DOB: 1960/09/20  Today's TOC FU Call Status: Today's TOC FU Call Status:: Unsuccessful Call (1st Attempt) Unsuccessful Call (1st Attempt) Date: 01/11/24  Attempted to reach the patient regarding the most recent Inpatient/ED visit.  Follow Up Plan: Additional outreach attempts will be made to reach the patient to complete the Transitions of Care (Post Inpatient/ED visit) call.   Signature Darrall Ellison, LPN [facility] Nurse Health Advisor Direct Dial [phone]

## 2024-01-12 NOTE — Transitions of Care (Post Inpatient/ED Visit) (Signed)
   01/12/2024  Name: Timothy Garrison MRN: [id number] DOB: 1960/10/09  Today's TOC FU Call Status: Today's TOC FU Call Status:: Successful TOC FU Call Completed Unsuccessful Call (1st Attempt) Date: 01/11/24 [facility] FU Call Complete Date: 01/12/24 Patient's Name and Date of Birth confirmed.  Transition Care Management Follow-up Telephone Call Date of Discharge: 01/10/24 Discharge Facility: Other [facility]) Name of Other ([facility]) Discharge Facility: [facility] Type of Discharge: Inpatient Admission Primary Inpatient Discharge Diagnosis:: cholecystitis How have you been since you were released from the hospital?: Better Any questions or concerns?: No  Items Reviewed: Did you receive and understand the discharge instructions provided?: Yes Medications obtained,verified, and reconciled?: Yes (Medications Reviewed) Any new allergies since your discharge?: No Dietary orders reviewed?: Yes Do you have support at home?: Yes People in Home [RPT]: spouse  Medications Reviewed Today: Medications Reviewed Today     Reviewed by Darrall Ellison, LPN (Licensed Practical Nurse) on 01/12/24 at 1456  Med List Status: <None>   Medication Order Taking? Sig Documenting Provider Last Dose Status Informant  apixaban  (ELIQUIS ) 5 MG TABS tablet [id number] No Take 1 tablet (5 mg total) by mouth every 12 (twelve) hours. Dellar Fenton, MD Taking Active   aspirin  EC 81 MG tablet [id number] No Take 81 mg by mouth daily. Swallow whole. [provider] Taking Active Self  busPIRone  (BUSPAR ) 5 MG tablet [id number] No Take 1 tablet (5 mg total) by mouth daily as needed. Dellar Fenton, MD Taking Active   doxycycline  (VIBRA -TABS) 100 MG tablet [id number]  Take 1 tablet (100 mg total) by mouth 2 (two) times daily. Farris Hong, PA-C  Active   lisinopril  (ZESTRIL ) 40 MG tablet [id number] No Take 1 tablet (40 mg total) by mouth daily. Dellar Fenton, MD Taking Active   Multiple Vitamin (MULTIVITAMIN)  capsule [id number] No Take by mouth. [provider] Taking Active Self  nitroGLYCERIN  (NITROSTAT ) 0.4 MG SL tablet [id number] No Place 0.4 mg under the tongue every 5 (five) minutes as needed for chest pain. [provider] Taking Active Self  omeprazole  (PRILOSEC) 20 MG capsule [id number] No Take 1 capsule (20 mg total) by mouth 2 (two) times daily. Dellar Fenton, MD Taking Active   pravastatin  (PRAVACHOL ) 40 MG tablet [id number] No Take 1 tablet by mouth daily. [provider] Taking Active   REPATHA  SURECLICK 140 MG/ML [place] [id number]  INJECT 140 MG INTO THE SKIN EVERY 14 DAYS Dellar Fenton, MD  Active             Home Care and Equipment/Supplies: Were Home Health Services Ordered?: NA Any new equipment or medical supplies ordered?: NA  Functional Questionnaire: Do you need assistance with bathing/showering or dressing?: No Do you need assistance with meal preparation?: No Do you need assistance with eating?: No Do you have difficulty maintaining continence: No Do you need assistance with getting out of bed/getting out of a chair/moving?: No Do you have difficulty managing or taking your medications?: No  Follow up appointments reviewed: PCP Follow-up appointment confirmed?: Yes Date of PCP follow-up appointment?: 01/18/24 Follow-up Provider: [facility] Follow-up appointment confirmed?: No Reason Specialist Follow-Up Not Confirmed: Patient has Specialist Provider Number and will Call for Appointment Do you need transportation to your follow-up appointment?: No Do you understand care options if your condition(s) worsen?: Yes-patient verbalized understanding    SIGNATURE Darrall Ellison, LPN [facility] Nurse Health Advisor Direct Dial [phone]

## 2024-01-18 ENCOUNTER — Ambulatory Visit: Payer: Self-pay | Admitting: Internal Medicine

## 2024-01-18 ENCOUNTER — Encounter: Payer: Self-pay | Admitting: Internal Medicine

## 2024-01-18 ENCOUNTER — Ambulatory Visit: Admitting: Internal Medicine

## 2024-01-18 ENCOUNTER — Other Ambulatory Visit: Payer: Self-pay | Admitting: Internal Medicine

## 2024-01-18 VITALS — BP 138/80 | HR 84 | Temp 98.0°F | Resp 16 | Ht 67.5 in | Wt 151.0 lb

## 2024-01-18 DIAGNOSIS — F419 Anxiety disorder, unspecified: Secondary | ICD-10-CM

## 2024-01-18 DIAGNOSIS — D649 Anemia, unspecified: Secondary | ICD-10-CM

## 2024-01-18 DIAGNOSIS — I25119 Atherosclerotic heart disease of native coronary artery with unspecified angina pectoris: Secondary | ICD-10-CM | POA: Diagnosis not present

## 2024-01-18 DIAGNOSIS — R748 Abnormal levels of other serum enzymes: Secondary | ICD-10-CM

## 2024-01-18 DIAGNOSIS — I10 Essential (primary) hypertension: Secondary | ICD-10-CM | POA: Diagnosis not present

## 2024-01-18 DIAGNOSIS — Z9049 Acquired absence of other specified parts of digestive tract: Secondary | ICD-10-CM | POA: Diagnosis not present

## 2024-01-18 DIAGNOSIS — R7989 Other specified abnormal findings of blood chemistry: Secondary | ICD-10-CM

## 2024-01-18 DIAGNOSIS — D72829 Elevated white blood cell count, unspecified: Secondary | ICD-10-CM

## 2024-01-18 DIAGNOSIS — D75839 Thrombocytosis, unspecified: Secondary | ICD-10-CM

## 2024-01-18 LAB — CBC WITH DIFFERENTIAL/PLATELET
Basophils Absolute: 0.1 10*3/uL (ref 0.0–0.1)
Basophils Relative: 0.7 % (ref 0.0–3.0)
Eosinophils Absolute: 0.1 10*3/uL (ref 0.0–0.7)
Eosinophils Relative: 1.5 % (ref 0.0–5.0)
HCT: 37.7 % — ABNORMAL LOW (ref 39.0–52.0)
Hemoglobin: 12.6 g/dL — ABNORMAL LOW (ref 13.0–17.0)
Lymphocytes Relative: 17.7 % (ref 12.0–46.0)
Lymphs Abs: 1.5 10*3/uL (ref 0.7–4.0)
MCHC: 33.5 g/dL (ref 30.0–36.0)
MCV: 83.3 fl (ref 78.0–100.0)
Monocytes Absolute: 0.7 10*3/uL (ref 0.1–1.0)
Monocytes Relative: 8.9 % (ref 3.0–12.0)
Neutro Abs: 5.9 10*3/uL (ref 1.4–7.7)
Neutrophils Relative %: 71.2 % (ref 43.0–77.0)
Platelets: 638 10*3/uL — ABNORMAL HIGH (ref 150.0–400.0)
RBC: 4.52 Mil/uL (ref 4.22–5.81)
RDW: 13.5 % (ref 11.5–15.5)
WBC: 8.3 10*3/uL (ref 4.0–10.5)

## 2024-01-18 LAB — HEPATIC FUNCTION PANEL
ALT: 43 U/L (ref 0–53)
AST: 25 U/L (ref 0–37)
Albumin: 4.1 g/dL (ref 3.5–5.2)
Alkaline Phosphatase: 170 U/L — ABNORMAL HIGH (ref 39–117)
Bilirubin, Direct: 0.1 mg/dL (ref 0.0–0.3)
Total Bilirubin: 0.3 mg/dL (ref 0.2–1.2)
Total Protein: 7.2 g/dL (ref 6.0–8.3)

## 2024-01-18 LAB — MAGNESIUM: Magnesium: 1.8 mg/dL (ref 1.5–2.5)

## 2024-01-18 MED ORDER — BUSPIRONE HCL 5 MG PO TABS: 5.0000 mg | ORAL_TABLET | Freq: Every day | ORAL | 1 refills | Status: DC | PRN

## 2024-01-18 MED ORDER — APIXABAN 5 MG PO TABS: 5.0000 mg | ORAL_TABLET | Freq: Two times a day (BID) | ORAL | 1 refills | Status: DC

## 2024-01-18 NOTE — Assessment & Plan Note (Signed)
 Had f/u with cardiology 07/07/23 - f/u triple vessel CAD. Continue aspirin , pravastatin , PCSK9 inhibitor. Graduated from cardiac rehab.  Overall appears to be stable.  Continue risk factor modification.

## 2024-01-18 NOTE — Assessment & Plan Note (Signed)
 Noted to have low magnesium  level during hospitalization.  Will recheck magnesium  level today.

## 2024-01-18 NOTE — Assessment & Plan Note (Signed)
 Noted to be elevated in the hospital.  Recheck today to confirm normal.

## 2024-01-18 NOTE — Assessment & Plan Note (Signed)
 Continue lisinopril .  Blood pressure as outlined. Continue to follow pressures.  No change in medication today.

## 2024-01-18 NOTE — Assessment & Plan Note (Signed)
 Overall appears to be doing well.  Has BuSpar  to take as needed.

## 2024-01-18 NOTE — Progress Notes (Signed)
Order placed for f/u labs.  

## 2024-01-18 NOTE — Assessment & Plan Note (Signed)
 Followed by hematology for IDA. Had capsule endoscopy 02/2022 - normal small intestine with no evidence of small bowels bleeding or lesions. Had f/u with Dr Randy Buttery 09/18/22.  F/u with Dr Randy Buttery 05/19/23 - ferritin 15 with iron saturation 15%. Iron infusion 05/31/23 and 06/07/23. Continue f/u with hematology.  Recheck CBC today.

## 2024-01-18 NOTE — Progress Notes (Signed)
 Subjective:    [name], male    DOB: 03/14/1961, 63 y.o.   MRN: [id number]  Patient here for  Chief Complaint  Patient presents with   Hospitalization Follow-up    HPI Here for hospital follow up - admitted 01/05/24 - 01/10/24 with acute on chronic cholecystitis. S/p lap cholecystectomy with cholangiography.  His well status postsurgery.  He is having some persistent abdominal soreness but it is gradually improving.  He is noticing some loose stools after eating.  Had questions about his diet.  Discussed.  No nausea or vomiting.  Overall appears to be recovering well.  We discussed labs that he had drawn in the hospital.  Does need some follow-up labs today to confirm normal. He has been eatig soft foods.    Past Medical History:  Diagnosis Date   Basal cell carcinoma (BCC) of back    Basal cell carcinoma (BCC) of nasal sidewall    Dizziness    GERD (gastroesophageal reflux disease)    H/O blood clots    Headache    migraines   Hypercholesterolemia    Hypertension    hx of HBP readings   Metastatic squamous cell carcinoma 2018   bx cervical lymph node   Seizures (HCC) 2021   Tonsillar cancer (HCC)    Vascular abnormality    VTE (venous thromboembolism) 2018   associated with chemotherapy   Past Surgical History:  Procedure Laterality Date   COLONOSCOPY     COLONOSCOPY N/A 05/08/2021   Procedure: COLONOSCOPY;  Surgeon: Quintin Buckle, DO;  Location: [facility] ENDOSCOPY;  Service: Gastroenterology;  Laterality: N/A;   endovascular balloon angioplasty femoral/popliteal artery w/stent Left 11/08/2017   ESOPHAGOGASTRODUODENOSCOPY N/A 05/08/2021   Procedure: ESOPHAGOGASTRODUODENOSCOPY (EGD);  Surgeon: Quintin Buckle, DO;  Location: [facility] ENDOSCOPY;  Service: Gastroenterology;  Laterality: N/A;   ESOPHAGOGASTRODUODENOSCOPY (EGD) WITH PROPOFOL  N/A 05/24/2015   Procedure: ESOPHAGOGASTRODUODENOSCOPY (EGD) WITH PROPOFOL ;  Surgeon: Cassie Click, MD;   Location: [facility] ENDOSCOPY;  Service: Endoscopy;  Laterality: N/A;   FEMORAL-FEMORAL BYPASS GRAFT     IR IMAGING GUIDED PORT INSERTION  12/09/2016   LEFT HEART CATH AND CORONARY ANGIOGRAPHY N/A 12/25/2019   Procedure: LEFT HEART CATH AND CORONARY ANGIOGRAPHY;  Surgeon: Michelle Aid, MD;  Location: [facility] INVASIVE CV LAB;  Service: Cardiovascular;  Laterality: N/A;   SAVORY DILATION N/A 05/24/2015   Procedure: SAVORY DILATION;  Surgeon: Cassie Click, MD;  Location: [facility] ENDOSCOPY;  Service: Endoscopy;  Laterality: N/A;   skin cancer removed from face   2024   Family History  Problem Relation Age of Onset   Hyperlipidemia Mother    Heart disease Mother    Hypertension Mother    Alzheimer's disease Mother    Prostate cancer Father    Stroke Father    Social History   Socioeconomic History   Marital status: Married    Spouse name: Not on file   Number of children: Not on file   Years of education: Not on file   Highest education level: Not on file  Occupational History   Occupation: Curator  Tobacco Use   Smoking status: Former    Current packs/day: 0.00    Types: Cigarettes    Quit date: 11/11/1996    Years since quitting: 27.2   Smokeless tobacco: Never  Vaping Use   Vaping status: Never Used  Substance and Sexual Activity   Alcohol use: Yes    Comment: occasional   Drug use: No  Sexual activity: Yes  Other Topics Concern   Not on file  Social History Narrative   Married   Social Drivers of Health   Financial Resource Strain: Low Risk  (06/25/2023)   Overall Financial Resource Strain (CARDIA)    Difficulty of Paying Living Expenses: Not hard at all  Food Insecurity: No Food Insecurity (06/25/2023)   Hunger Vital Sign    Worried About Running Out of Food in the Last Year: Never true    Ran Out of Food in the Last Year: Never true  Transportation Needs: No Transportation Needs (06/25/2023)   PRAPARE - Administrator, Civil Service (Medical): No     Lack of Transportation (Non-Medical): No  Physical Activity: Insufficiently Active (06/25/2023)   Exercise Vital Sign    Days of Exercise per Week: 3 days    Minutes of Exercise per Session: 30 min  Stress: No Stress Concern Present (06/25/2023)   [employer] of Occupational Health - Occupational Stress Questionnaire    Feeling of Stress : Only a little  Social Connections: Moderately Isolated (06/25/2023)   Social Connection and Isolation Panel [NHANES]    Frequency of Communication with Friends and Family: More than three times a week    Frequency of Social Gatherings with Friends and Family: Three times a week    Attends Religious Services: Never    Active Member of Clubs or Organizations: No    Attends Banker Meetings: Never    Marital Status: Married     Review of Systems  Constitutional:  Negative for fever and unexpected weight change.  HENT:  Negative for congestion and sinus pressure.   Respiratory:  Negative for cough, chest tightness and shortness of breath.   Cardiovascular:  Negative for chest pain, palpitations and leg swelling.  Gastrointestinal:  Negative for nausea and vomiting.       Some abdominal soreness. Some loose stool as outlined.   Genitourinary:  Negative for difficulty urinating and dysuria.  Musculoskeletal:  Negative for joint swelling and myalgias.  Skin:  Negative for color change and rash.  Neurological:  Negative for dizziness and headaches.  Psychiatric/Behavioral:  Negative for agitation and dysphoric mood.        Objective:     BP 138/80   Pulse 84   Temp 98 F (36.7 C)   Resp 16   Ht 5' 7.5" (1.715 m)   Wt 151 lb (68.5 kg)   SpO2 99%   BMI 23.30 kg/m  Wt Readings from Last 3 Encounters:  01/18/24 151 lb (68.5 kg)  11/17/23 158 lb (71.7 kg)  09/14/23 162 lb (73.5 kg)    Physical Exam Vitals reviewed.  Constitutional:      General: He is not in acute distress.    Appearance: Normal appearance. He is  well-developed.  HENT:     Head: Normocephalic and atraumatic.     Right Ear: External ear normal.     Left Ear: External ear normal.     Mouth/Throat:     Pharynx: No oropharyngeal exudate or posterior oropharyngeal erythema.  Eyes:     General: No scleral icterus.       Right eye: No discharge.        Left eye: No discharge.     Conjunctiva/sclera: Conjunctivae normal.  Cardiovascular:     Rate and Rhythm: Normal rate and regular rhythm.  Pulmonary:     Effort: Pulmonary effort is normal. No respiratory distress.     Breath  sounds: Normal breath sounds.  Abdominal:     General: Bowel sounds are normal.     Palpations: Abdomen is soft.     Comments: Minimal soreness to palpation. Incision sites are healing well. No increased erythema.   Musculoskeletal:        General: No swelling or tenderness.     Cervical back: Neck supple. No tenderness.  Lymphadenopathy:     Cervical: No cervical adenopathy.  Skin:    Findings: No erythema or rash.  Neurological:     Mental Status: He is alert.  Psychiatric:        Mood and Affect: Mood normal.        Behavior: Behavior normal.         Outpatient Encounter Medications as of 01/18/2024  Medication Sig   apixaban  (ELIQUIS ) 5 MG TABS tablet Take 1 tablet (5 mg total) by mouth every 12 (twelve) hours.   aspirin  EC 81 MG tablet Take 81 mg by mouth daily. Swallow whole.   busPIRone  (BUSPAR ) 5 MG tablet Take 1 tablet (5 mg total) by mouth daily as needed.   lisinopril  (ZESTRIL ) 40 MG tablet Take 1 tablet (40 mg total) by mouth daily.   Multiple Vitamin (MULTIVITAMIN) capsule Take by mouth.   nitroGLYCERIN  (NITROSTAT ) 0.4 MG SL tablet Place 0.4 mg under the tongue every 5 (five) minutes as needed for chest pain.   omeprazole  (PRILOSEC) 20 MG capsule Take 1 capsule (20 mg total) by mouth 2 (two) times daily.   pravastatin  (PRAVACHOL ) 40 MG tablet Take 1 tablet by mouth daily.   REPATHA  SURECLICK 140 MG/ML SOAJ INJECT 140 MG INTO THE SKIN  EVERY 14 DAYS   [DISCONTINUED] apixaban  (ELIQUIS ) 5 MG TABS tablet Take 1 tablet (5 mg total) by mouth every 12 (twelve) hours.   [DISCONTINUED] busPIRone  (BUSPAR ) 5 MG tablet Take 1 tablet (5 mg total) by mouth daily as needed.   [DISCONTINUED] doxycycline  (VIBRA -TABS) 100 MG tablet Take 1 tablet (100 mg total) by mouth 2 (two) times daily.   No facility-administered encounter medications on file as of 01/18/2024.     Lab Results  Component Value Date   WBC 6.9 11/22/2023   HGB 13.0 11/22/2023   HCT 39.0 11/22/2023   PLT 417 (H) 11/22/2023   GLUCOSE 97 11/17/2023   CHOL 135 11/17/2023   TRIG 129.0 11/17/2023   HDL 44.70 11/17/2023   LDLDIRECT 163.0 09/08/2022   LDLCALC 64 11/17/2023   ALT 23 11/17/2023   AST 23 11/17/2023   NA 137 11/17/2023   K 4.8 11/17/2023   CL 101 11/17/2023   CREATININE 1.08 11/17/2023   BUN 15 11/17/2023   CO2 28 11/17/2023   TSH 2.15 08/06/2023   PSA 1.55 08/06/2023   INR 1.1 12/23/2019   HGBA1C 6.0 (H) 05/23/2019    MR CERVICAL SPINE WO CONTRAST Result Date: 06/15/2023 CLINICAL DATA:  Cervical radiculitis. EXAM: MRI CERVICAL SPINE WITHOUT CONTRAST TECHNIQUE: Multiplanar, multisequence MR imaging of the cervical spine was performed. No intravenous contrast was administered. COMPARISON:  03/30/2021 FINDINGS: Alignment: Mild C2-3, C3-4 and C7-T1 degenerative anterolisthesis. Vertebrae: No fracture, evidence of discitis, or bone lesion. Cord: Normal signal and morphology. Posterior Fossa, vertebral arteries, paraspinal tissues: Negative. Disc levels: Challenging assessment of the foramina on axial images especially due to blooming on gradient series. C2-3: Degenerative facet spurring asymmetric to the left. C3-4: Degenerative facet spurring with mild anterolisthesis. Small uncovertebral spurs. Mild left foraminal narrowing by T2 weighted imaging. C4-5: Disc space narrowing with uncovertebral  ridging. Degenerative facet spurring on both sides. Mild or moderate  foraminal narrowing based on axial T2 weighted imaging C5-6: Mild disc bulging.  No neural impingement. C6-7: Mild disc bulging.  No neural impingement. C7-T1:Degenerative facet spurring asymmetric to the right. IMPRESSION: 1. Generalized cervical spine degeneration similar to 2022, as described. 2. Mild to moderate left foraminal narrowing at C4-5. Widely patent spinal canal. Electronically Signed   By: Ronnette Coke M.D.   On: 06/15/2023 07:53       Assessment & Plan:  History of cholecystectomy Assessment & Plan: Is doing well status post surgery.  Having loose stool after eating.  Discussed diet.  Incisions healing well.  Follow-up.   Abnormal liver function tests Assessment & Plan: Noted to be elevated in the hospital.  Recheck today to confirm normal.  Orders: -     Hepatic function panel  Hypomagnesemia Assessment & Plan: Noted to have low magnesium  level during hospitalization.  Will recheck magnesium  level today.  Orders: -     Magnesium   Leukocytosis, unspecified type Assessment & Plan: Noted to have elevated white count during his hospitalization.  Recheck CBC today to confirm normal.  Orders: -     CBC with Differential/Platelet  Anemia, unspecified type Assessment & Plan: Followed by hematology for IDA. Had capsule endoscopy 02/2022 - normal small intestine with no evidence of small bowels bleeding or lesions. Had f/u with Dr Randy Buttery 09/18/22.  F/u with Dr Randy Buttery 05/19/23 - ferritin 15 with iron saturation 15%. Iron infusion 05/31/23 and 06/07/23. Continue f/u with hematology.  Recheck CBC today.   Anxiety Assessment & Plan: Overall appears to be doing well.  Has BuSpar  to take as needed.   Coronary artery disease involving native coronary artery of native heart with angina pectoris [facility]) Assessment & Plan: Had f/u with cardiology 07/07/23 - f/u triple vessel CAD. Continue aspirin , pravastatin , PCSK9 inhibitor. Graduated from cardiac rehab.  Overall appears to be  stable.  Continue risk factor modification.   Essential hypertension, benign Assessment & Plan: Continue lisinopril .  Blood pressure as outlined. Continue to follow pressures.  No change in medication today.   Other orders -     Apixaban ; Take 1 tablet (5 mg total) by mouth every 12 (twelve) hours.  Dispense: 180 tablet; Refill: 1 -     busPIRone  HCl; Take 1 tablet (5 mg total) by mouth daily as needed.  Dispense: 30 tablet; Refill: 1     Dellar Fenton, MD

## 2024-01-18 NOTE — Assessment & Plan Note (Signed)
 Is doing well status post surgery.  Having loose stool after eating.  Discussed diet.  Incisions healing well.  Follow-up.

## 2024-01-18 NOTE — Assessment & Plan Note (Signed)
 Noted to have elevated white count during his hospitalization.  Recheck CBC today to confirm normal.

## 2024-01-25 ENCOUNTER — Other Ambulatory Visit (INDEPENDENT_AMBULATORY_CARE_PROVIDER_SITE_OTHER)

## 2024-01-25 DIAGNOSIS — R748 Abnormal levels of other serum enzymes: Secondary | ICD-10-CM

## 2024-01-25 DIAGNOSIS — D75839 Thrombocytosis, unspecified: Secondary | ICD-10-CM

## 2024-01-25 LAB — CBC WITH DIFFERENTIAL/PLATELET
Basophils Absolute: 0.1 10*3/uL (ref 0.0–0.1)
Basophils Relative: 0.7 % (ref 0.0–3.0)
Eosinophils Absolute: 0.1 10*3/uL (ref 0.0–0.7)
Eosinophils Relative: 2 % (ref 0.0–5.0)
HCT: 36.2 % — ABNORMAL LOW (ref 39.0–52.0)
Hemoglobin: 12.2 g/dL — ABNORMAL LOW (ref 13.0–17.0)
Lymphocytes Relative: 25.2 % (ref 12.0–46.0)
Lymphs Abs: 1.8 10*3/uL (ref 0.7–4.0)
MCHC: 33.7 g/dL (ref 30.0–36.0)
MCV: 83.2 fl (ref 78.0–100.0)
Monocytes Absolute: 0.7 10*3/uL (ref 0.1–1.0)
Monocytes Relative: 9 % (ref 3.0–12.0)
Neutro Abs: 4.6 10*3/uL (ref 1.4–7.7)
Neutrophils Relative %: 63.1 % (ref 43.0–77.0)
Platelets: 635 10*3/uL — ABNORMAL HIGH (ref 150.0–400.0)
RBC: 4.35 Mil/uL (ref 4.22–5.81)
RDW: 13.5 % (ref 11.5–15.5)
WBC: 7.2 10*3/uL (ref 4.0–10.5)

## 2024-01-25 LAB — HEPATIC FUNCTION PANEL
ALT: 29 U/L (ref 0–53)
AST: 21 U/L (ref 0–37)
Albumin: 4.2 g/dL (ref 3.5–5.2)
Alkaline Phosphatase: 97 U/L (ref 39–117)
Bilirubin, Direct: 0.1 mg/dL (ref 0.0–0.3)
Total Bilirubin: 0.3 mg/dL (ref 0.2–1.2)
Total Protein: 7 g/dL (ref 6.0–8.3)

## 2024-01-26 ENCOUNTER — Other Ambulatory Visit: Payer: Self-pay | Admitting: Internal Medicine

## 2024-01-26 ENCOUNTER — Ambulatory Visit: Payer: Self-pay | Admitting: Internal Medicine

## 2024-01-26 DIAGNOSIS — D649 Anemia, unspecified: Secondary | ICD-10-CM

## 2024-01-26 NOTE — Progress Notes (Signed)
Order placed for f/u labs.  

## 2024-02-01 DIAGNOSIS — R42 Dizziness and giddiness: Secondary | ICD-10-CM | POA: Diagnosis not present

## 2024-02-01 DIAGNOSIS — R569 Unspecified convulsions: Secondary | ICD-10-CM | POA: Diagnosis not present

## 2024-02-01 DIAGNOSIS — Z8673 Personal history of transient ischemic attack (TIA), and cerebral infarction without residual deficits: Secondary | ICD-10-CM | POA: Diagnosis not present

## 2024-02-07 ENCOUNTER — Other Ambulatory Visit (INDEPENDENT_AMBULATORY_CARE_PROVIDER_SITE_OTHER)

## 2024-02-07 DIAGNOSIS — D649 Anemia, unspecified: Secondary | ICD-10-CM | POA: Diagnosis not present

## 2024-02-07 LAB — CBC WITH DIFFERENTIAL/PLATELET
Basophils Absolute: 0 10*3/uL (ref 0.0–0.1)
Basophils Relative: 0.5 % (ref 0.0–3.0)
Eosinophils Absolute: 0.1 10*3/uL (ref 0.0–0.7)
Eosinophils Relative: 1.1 % (ref 0.0–5.0)
HCT: 37.2 % — ABNORMAL LOW (ref 39.0–52.0)
Hemoglobin: 12.5 g/dL — ABNORMAL LOW (ref 13.0–17.0)
Lymphocytes Relative: 26.1 % (ref 12.0–46.0)
Lymphs Abs: 2 10*3/uL (ref 0.7–4.0)
MCHC: 33.6 g/dL (ref 30.0–36.0)
MCV: 82.1 fl (ref 78.0–100.0)
Monocytes Absolute: 0.7 10*3/uL (ref 0.1–1.0)
Monocytes Relative: 9.5 % (ref 3.0–12.0)
Neutro Abs: 4.7 10*3/uL (ref 1.4–7.7)
Neutrophils Relative %: 62.8 % (ref 43.0–77.0)
Platelets: 326 10*3/uL (ref 150.0–400.0)
RBC: 4.53 Mil/uL (ref 4.22–5.81)
RDW: 14 % (ref 11.5–15.5)
WBC: 7.6 10*3/uL (ref 4.0–10.5)

## 2024-02-07 LAB — IBC + FERRITIN
Ferritin: 262.8 ng/mL (ref 22.0–322.0)
Iron: 82 ug/dL (ref 42–165)
Saturation Ratios: 42.1 % (ref 20.0–50.0)
TIBC: 194.6 ug/dL — ABNORMAL LOW (ref 250.0–450.0)
Transferrin: 139 mg/dL — ABNORMAL LOW (ref 212.0–360.0)

## 2024-02-07 LAB — VITAMIN B12: Vitamin B-12: 386 pg/mL (ref 211–911)

## 2024-02-08 ENCOUNTER — Ambulatory Visit: Payer: Self-pay | Admitting: Internal Medicine

## 2024-02-27 ENCOUNTER — Other Ambulatory Visit: Payer: Self-pay | Admitting: Internal Medicine

## 2024-03-06 DIAGNOSIS — D2262 Melanocytic nevi of left upper limb, including shoulder: Secondary | ICD-10-CM | POA: Diagnosis not present

## 2024-03-06 DIAGNOSIS — Z85828 Personal history of other malignant neoplasm of skin: Secondary | ICD-10-CM | POA: Diagnosis not present

## 2024-03-06 DIAGNOSIS — D2272 Melanocytic nevi of left lower limb, including hip: Secondary | ICD-10-CM | POA: Diagnosis not present

## 2024-03-06 DIAGNOSIS — Z85820 Personal history of malignant melanoma of skin: Secondary | ICD-10-CM | POA: Diagnosis not present

## 2024-03-06 DIAGNOSIS — D2261 Melanocytic nevi of right upper limb, including shoulder: Secondary | ICD-10-CM | POA: Diagnosis not present

## 2024-03-06 DIAGNOSIS — D2271 Melanocytic nevi of right lower limb, including hip: Secondary | ICD-10-CM | POA: Diagnosis not present

## 2024-03-06 DIAGNOSIS — D485 Neoplasm of uncertain behavior of skin: Secondary | ICD-10-CM | POA: Diagnosis not present

## 2024-03-06 DIAGNOSIS — C44619 Basal cell carcinoma of skin of left upper limb, including shoulder: Secondary | ICD-10-CM | POA: Diagnosis not present

## 2024-03-09 DIAGNOSIS — M47812 Spondylosis without myelopathy or radiculopathy, cervical region: Secondary | ICD-10-CM | POA: Diagnosis not present

## 2024-03-09 DIAGNOSIS — M4802 Spinal stenosis, cervical region: Secondary | ICD-10-CM | POA: Diagnosis not present

## 2024-03-09 DIAGNOSIS — M5412 Radiculopathy, cervical region: Secondary | ICD-10-CM | POA: Diagnosis not present

## 2024-03-24 ENCOUNTER — Ambulatory Visit (INDEPENDENT_AMBULATORY_CARE_PROVIDER_SITE_OTHER): Admitting: Internal Medicine

## 2024-03-24 ENCOUNTER — Encounter: Payer: Self-pay | Admitting: Internal Medicine

## 2024-03-24 VITALS — BP 134/78 | HR 89 | Ht 67.5 in | Wt 158.6 lb

## 2024-03-24 DIAGNOSIS — I70219 Atherosclerosis of native arteries of extremities with intermittent claudication, unspecified extremity: Secondary | ICD-10-CM

## 2024-03-24 DIAGNOSIS — E7800 Pure hypercholesterolemia, unspecified: Secondary | ICD-10-CM | POA: Diagnosis not present

## 2024-03-24 DIAGNOSIS — I25119 Atherosclerotic heart disease of native coronary artery with unspecified angina pectoris: Secondary | ICD-10-CM | POA: Diagnosis not present

## 2024-03-24 DIAGNOSIS — K219 Gastro-esophageal reflux disease without esophagitis: Secondary | ICD-10-CM

## 2024-03-24 DIAGNOSIS — I10 Essential (primary) hypertension: Secondary | ICD-10-CM | POA: Diagnosis not present

## 2024-03-24 DIAGNOSIS — M542 Cervicalgia: Secondary | ICD-10-CM

## 2024-03-24 DIAGNOSIS — Z85819 Personal history of malignant neoplasm of unspecified site of lip, oral cavity, and pharynx: Secondary | ICD-10-CM

## 2024-03-24 DIAGNOSIS — D649 Anemia, unspecified: Secondary | ICD-10-CM | POA: Diagnosis not present

## 2024-03-24 DIAGNOSIS — I700 Atherosclerosis of aorta: Secondary | ICD-10-CM

## 2024-03-24 DIAGNOSIS — R5383 Other fatigue: Secondary | ICD-10-CM

## 2024-03-24 DIAGNOSIS — G40909 Epilepsy, unspecified, not intractable, without status epilepticus: Secondary | ICD-10-CM

## 2024-03-24 LAB — IBC + FERRITIN
Ferritin: 201.9 ng/mL (ref 22.0–322.0)
Iron: 60 ug/dL (ref 42–165)
Saturation Ratios: 28 % (ref 20.0–50.0)
TIBC: 214.2 ug/dL — ABNORMAL LOW (ref 250.0–450.0)
Transferrin: 153 mg/dL — ABNORMAL LOW (ref 212.0–360.0)

## 2024-03-24 LAB — CBC WITH DIFFERENTIAL/PLATELET
Basophils Absolute: 0 K/uL (ref 0.0–0.1)
Basophils Relative: 0.5 % (ref 0.0–3.0)
Eosinophils Absolute: 0.1 K/uL (ref 0.0–0.7)
Eosinophils Relative: 2 % (ref 0.0–5.0)
HCT: 40.8 % (ref 39.0–52.0)
Hemoglobin: 13.3 g/dL (ref 13.0–17.0)
Lymphocytes Relative: 27.8 % (ref 12.0–46.0)
Lymphs Abs: 1.7 K/uL (ref 0.7–4.0)
MCHC: 32.7 g/dL (ref 30.0–36.0)
MCV: 85 fl (ref 78.0–100.0)
Monocytes Absolute: 0.6 K/uL (ref 0.1–1.0)
Monocytes Relative: 9.7 % (ref 3.0–12.0)
Neutro Abs: 3.6 K/uL (ref 1.4–7.7)
Neutrophils Relative %: 60 % (ref 43.0–77.0)
Platelets: 377 K/uL (ref 150.0–400.0)
RBC: 4.8 Mil/uL (ref 4.22–5.81)
RDW: 14.3 % (ref 11.5–15.5)
WBC: 6 K/uL (ref 4.0–10.5)

## 2024-03-24 LAB — HEPATIC FUNCTION PANEL
ALT: 29 U/L (ref 0–53)
AST: 21 U/L (ref 0–37)
Albumin: 4.5 g/dL (ref 3.5–5.2)
Alkaline Phosphatase: 62 U/L (ref 39–117)
Bilirubin, Direct: 0.1 mg/dL (ref 0.0–0.3)
Total Bilirubin: 0.4 mg/dL (ref 0.2–1.2)
Total Protein: 7.2 g/dL (ref 6.0–8.3)

## 2024-03-24 LAB — BASIC METABOLIC PANEL WITH GFR
BUN: 16 mg/dL (ref 6–23)
CO2: 30 meq/L (ref 19–32)
Calcium: 9.6 mg/dL (ref 8.4–10.5)
Chloride: 99 meq/L (ref 96–112)
Creatinine, Ser: 1.09 mg/dL (ref 0.40–1.50)
GFR: 72.12 mL/min (ref >60.00)
Glucose, Bld: 94 mg/dL (ref 70–99)
Potassium: 4.9 meq/L (ref 3.5–5.1)
Sodium: 137 meq/L (ref 135–145)

## 2024-03-24 LAB — LIPID PANEL
Cholesterol: 159 mg/dL (ref 0–200)
HDL: 49 mg/dL (ref >39.00)
LDL Cholesterol: 74 mg/dL (ref 0–99)
NonHDL: 110.09
Total CHOL/HDL Ratio: 3
Triglycerides: 180 mg/dL — ABNORMAL HIGH (ref 0.0–149.0)
VLDL: 36 mg/dL (ref 0.0–40.0)

## 2024-03-24 LAB — TSH: TSH: 2.74 u[IU]/mL (ref 0.35–5.50)

## 2024-03-24 NOTE — Assessment & Plan Note (Signed)
 No upper symptoms related. Continue prilosec.

## 2024-03-24 NOTE — Assessment & Plan Note (Signed)
 Followed by hematology for IDA. Had capsule endoscopy 02/2022 - normal small intestine with no evidence of small bowels bleeding or lesions. Had f/u with Dr Melanee 09/18/22.  F/u with Dr Melanee 05/19/23 - ferritin 15 with iron saturation 15%. Iron infusion 05/31/23 and 06/07/23. Continue f/u with hematology. Request cbc and iron studies checked today given fatigue.

## 2024-03-24 NOTE — Assessment & Plan Note (Signed)
 Has seen neurosurgery.   MRI -   multilevel degenerative changes most concerning for facet arthropathy. PT.  Still with neck pain.  When increased, associated light headedness.  Continue exercises - PT and vestibular.  Given increased pain and persistent pain, was referred to Dr Avanell. Evaluated by Dr Avanell [facility]) 05/24/23 - recommended continued home exercise and MRI. MRI of the cervical spine dated 05/31/2023 revealed moderate bilateral foraminal stenosis at C4-5. Recommended ESI. S/p ESI 07/02/23. Has f/u 08/04/23.  Had f/u with physiatry [facility]) - 03/09/24 - f/u neck pain. Worsening pain recently. Recommended left C4-5 transforaminal ESI - scheduled for 03/28/24.

## 2024-03-24 NOTE — Assessment & Plan Note (Signed)
 S/p fem fem bypass.  Followed by [facility] - vascular.  On eliquis .  Continue pravastatin  and repatha .

## 2024-03-24 NOTE — Assessment & Plan Note (Signed)
 S/p fem fem bypass.  Followed by [facility].  On eliquis.  Continue pravastatin and repatha. Continue risk factor modification.

## 2024-03-24 NOTE — Assessment & Plan Note (Signed)
 Reports some minimal increased fatigue. Request cbc and iron studies to be checked. Also check metabolic panel and thyroid .

## 2024-03-24 NOTE — Assessment & Plan Note (Signed)
 No recent seizures.  Saw neurology.  S/p EEG.  Off lamictal. Doing well. Follow.

## 2024-03-24 NOTE — Assessment & Plan Note (Signed)
 Continue lisinopril .  Blood pressure as outlined. Continue to follow pressures.  No change in medication today.

## 2024-03-24 NOTE — Assessment & Plan Note (Signed)
 S/p XRT and chemo.  Followed by oncology.

## 2024-03-24 NOTE — Progress Notes (Signed)
 Subjective:    [name], male    DOB: [birth year], 63 y.o.   MRN: [id number]  Patient here for  Chief Complaint  Patient presents with   Medical Management of Chronic Issues    4 month follow up     HPI Here for a scheduled follow up - follow up regarding CAD, anemia and hypertension. Admitted 01/05/24 - 01/10/24 with acute on chronic cholecystitis. S/p lap cholecystectomy with cholangiography. Had f/u with neurology 02/01/24 - recommended to continue aspirin  and eliquis . Had f/u with physiatry Merri) - 03/09/24 - f/u neck pain. Worsening pain recently. Recommended left C4-5 transforaminal ESI - scheduled for 03/28/24.  He reports other than some mild general fatigue he is overall doing relatively well.  Denies any change in his breathing.  No chest pain reported.  No abdominal pain or bowel changes reported.  He does have a follow-up at [facility] in October to follow-up on his carotids.   Past Medical History:  Diagnosis Date   Basal cell carcinoma (BCC) of back    Basal cell carcinoma (BCC) of nasal sidewall    Dizziness    GERD (gastroesophageal reflux disease)    H/O blood clots    Headache    migraines   Hypercholesterolemia    Hypertension    hx of HBP readings   Metastatic squamous cell carcinoma 2018   bx cervical lymph node   Seizures (HCC) 2021   Tonsillar cancer (HCC)    Vascular abnormality    VTE (venous thromboembolism) 2018   associated with chemotherapy   Past Surgical History:  Procedure Laterality Date   COLONOSCOPY     COLONOSCOPY N/A 05/08/2021   Procedure: COLONOSCOPY;  Surgeon: Onita Elspeth Sharper, DO;  Location: [facility] ENDOSCOPY;  Service: Gastroenterology;  Laterality: N/A;   endovascular balloon angioplasty femoral/popliteal artery w/stent Left 11/08/2017   ESOPHAGOGASTRODUODENOSCOPY N/A 05/08/2021   Procedure: ESOPHAGOGASTRODUODENOSCOPY (EGD);  Surgeon: Onita Elspeth Sharper, DO;  Location: [facility] ENDOSCOPY;  Service: Gastroenterology;   Laterality: N/A;   ESOPHAGOGASTRODUODENOSCOPY (EGD) WITH PROPOFOL  N/A 05/24/2015   Procedure: ESOPHAGOGASTRODUODENOSCOPY (EGD) WITH PROPOFOL ;  Surgeon: Lamar ONEIDA Holmes, MD;  Location: [facility] ENDOSCOPY;  Service: Endoscopy;  Laterality: N/A;   FEMORAL-FEMORAL BYPASS GRAFT     IR IMAGING GUIDED PORT INSERTION  12/09/2016   LEFT HEART CATH AND CORONARY ANGIOGRAPHY N/A 12/25/2019   Procedure: LEFT HEART CATH AND CORONARY ANGIOGRAPHY;  Surgeon: Hester Wolm PARAS, MD;  Location: [facility] INVASIVE CV LAB;  Service: Cardiovascular;  Laterality: N/A;   SAVORY DILATION N/A 05/24/2015   Procedure: SAVORY DILATION;  Surgeon: Lamar ONEIDA Holmes, MD;  Location: [facility] ENDOSCOPY;  Service: Endoscopy;  Laterality: N/A;   skin cancer removed from face   2024   Family History  Problem Relation Age of Onset   Hyperlipidemia Mother    Heart disease Mother    Hypertension Mother    Alzheimer's disease Mother    Prostate cancer Father    Stroke Father    Social History   Socioeconomic History   Marital status: Married    Spouse name: Not on file   Number of children: Not on file   Years of education: Not on file   Highest education level: Not on file  Occupational History   Occupation: Curator  Tobacco Use   Smoking status: Former    Current packs/day: 0.00    Types: Cigarettes    Quit date: 11/11/1996    Years since quitting: 27.4   Smokeless tobacco: Never  Vaping Use   Vaping status: Never Used  Substance and Sexual Activity   Alcohol use: Yes    Comment: occasional   Drug use: No   Sexual activity: Yes  Other Topics Concern   Not on file  Social History Narrative   Married   Social Drivers of Health   Financial Resource Strain: Low Risk  (06/25/2023)   Overall Financial Resource Strain (CARDIA)    Difficulty of Paying Living Expenses: Not hard at all  Food Insecurity: No Food Insecurity (06/25/2023)   Hunger Vital Sign    Worried About Running Out of Food in the Last Year: Never true     Ran Out of Food in the Last Year: Never true  Transportation Needs: No Transportation Needs (06/25/2023)   PRAPARE - Administrator, Civil Service (Medical): No    Lack of Transportation (Non-Medical): No  Physical Activity: Insufficiently Active (06/25/2023)   Exercise Vital Sign    Days of Exercise per Week: 3 days    Minutes of Exercise per Session: 30 min  Stress: No Stress Concern Present (06/25/2023)   [employer] of Occupational Health - Occupational Stress Questionnaire    Feeling of Stress : Only a little  Social Connections: Moderately Isolated (06/25/2023)   Social Connection and Isolation Panel    Frequency of Communication with Friends and Family: More than three times a week    Frequency of Social Gatherings with Friends and Family: Three times a week    Attends Religious Services: Never    Active Member of Clubs or Organizations: No    Attends Banker Meetings: Never    Marital Status: Married     Review of Systems  Constitutional:  Positive for fatigue. Negative for appetite change and unexpected weight change.  HENT:  Negative for congestion and sinus pressure.   Respiratory:  Negative for cough, chest tightness and shortness of breath.   Cardiovascular:  Negative for chest pain, palpitations and leg swelling.  Gastrointestinal:  Negative for abdominal pain, diarrhea, nausea and vomiting.  Genitourinary:  Negative for difficulty urinating and dysuria.  Musculoskeletal:  Negative for joint swelling and myalgias.  Skin:  Negative for color change and rash.  Neurological:  Negative for dizziness and headaches.  Psychiatric/Behavioral:  Negative for agitation and dysphoric mood.        Objective:     BP 134/78   Pulse 89   Ht 5' 7.5 (1.715 m)   Wt 158 lb 9.6 oz (71.9 kg)   SpO2 97%   BMI 24.47 kg/m  Wt Readings from Last 3 Encounters:  03/24/24 158 lb 9.6 oz (71.9 kg)  01/18/24 151 lb (68.5 kg)  11/17/23 158 lb (71.7 kg)     Physical Exam Vitals reviewed.  Constitutional:      General: He is not in acute distress.    Appearance: Normal appearance. He is well-developed.  HENT:     Head: Normocephalic and atraumatic.     Right Ear: External ear normal.     Left Ear: External ear normal.     Mouth/Throat:     Pharynx: No oropharyngeal exudate or posterior oropharyngeal erythema.  Eyes:     General: No scleral icterus.       Right eye: No discharge.        Left eye: No discharge.     Conjunctiva/sclera: Conjunctivae normal.  Cardiovascular:     Rate and Rhythm: Normal rate and regular rhythm.  Pulmonary:  Effort: Pulmonary effort is normal. No respiratory distress.     Breath sounds: Normal breath sounds.  Abdominal:     General: Bowel sounds are normal.     Palpations: Abdomen is soft.     Tenderness: There is no abdominal tenderness.  Musculoskeletal:        General: No swelling or tenderness.     Cervical back: Neck supple. No tenderness.  Lymphadenopathy:     Cervical: No cervical adenopathy.  Skin:    Findings: No erythema or rash.  Neurological:     Mental Status: He is alert.  Psychiatric:        Mood and Affect: Mood normal.        Behavior: Behavior normal.         Outpatient Encounter Medications as of 03/24/2024  Medication Sig   apixaban  (ELIQUIS ) 5 MG TABS tablet Take 1 tablet (5 mg total) by mouth every 12 (twelve) hours.   aspirin  EC 81 MG tablet Take 81 mg by mouth daily. Swallow whole.   busPIRone  (BUSPAR ) 5 MG tablet Take 1 tablet (5 mg total) by mouth daily as needed.   lisinopril  (ZESTRIL ) 40 MG tablet Take 1 tablet (40 mg total) by mouth daily.   Multiple Vitamin (MULTIVITAMIN) capsule Take by mouth.   nitroGLYCERIN  (NITROSTAT ) 0.4 MG SL tablet Place 0.4 mg under the tongue every 5 (five) minutes as needed for chest pain.   omeprazole  (PRILOSEC) 20 MG capsule Take 1 capsule (20 mg total) by mouth 2 (two) times daily.   pravastatin  (PRAVACHOL ) 40 MG tablet Take 1  tablet by mouth daily.   REPATHA  SURECLICK 140 MG/ML SOAJ INJECT 140 MG INTO THE SKIN EVERY 14 DAYS   No facility-administered encounter medications on file as of 03/24/2024.     Lab Results  Component Value Date   WBC 6.0 03/24/2024   HGB 13.3 03/24/2024   HCT 40.8 03/24/2024   PLT 377.0 03/24/2024   GLUCOSE 94 03/24/2024   CHOL 159 03/24/2024   TRIG 180.0 (H) 03/24/2024   HDL 49.00 03/24/2024   LDLDIRECT 163.0 09/08/2022   LDLCALC 74 03/24/2024   ALT 29 03/24/2024   AST 21 03/24/2024   NA 137 03/24/2024   K 4.9 03/24/2024   CL 99 03/24/2024   CREATININE 1.09 03/24/2024   BUN 16 03/24/2024   CO2 30 03/24/2024   TSH 2.74 03/24/2024   PSA 1.55 08/06/2023   INR 1.1 12/23/2019   HGBA1C 6.0 (H) 05/23/2019    MR CERVICAL SPINE WO CONTRAST Result Date: 06/15/2023 CLINICAL DATA:  Cervical radiculitis. EXAM: MRI CERVICAL SPINE WITHOUT CONTRAST TECHNIQUE: Multiplanar, multisequence MR imaging of the cervical spine was performed. No intravenous contrast was administered. COMPARISON:  03/30/2021 FINDINGS: Alignment: Mild C2-3, C3-4 and C7-T1 degenerative anterolisthesis. Vertebrae: No fracture, evidence of discitis, or bone lesion. Cord: Normal signal and morphology. Posterior Fossa, vertebral arteries, paraspinal tissues: Negative. Disc levels: Challenging assessment of the foramina on axial images especially due to blooming on gradient series. C2-3: Degenerative facet spurring asymmetric to the left. C3-4: Degenerative facet spurring with mild anterolisthesis. Small uncovertebral spurs. Mild left foraminal narrowing by T2 weighted imaging. C4-5: Disc space narrowing with uncovertebral ridging. Degenerative facet spurring on both sides. Mild or moderate foraminal narrowing based on axial T2 weighted imaging C5-6: Mild disc bulging.  No neural impingement. C6-7: Mild disc bulging.  No neural impingement. C7-T1:Degenerative facet spurring asymmetric to the right. IMPRESSION: 1. Generalized  cervical spine degeneration similar to 2022, as described. 2. Mild  to moderate left foraminal narrowing at C4-5. Widely patent spinal canal. Electronically Signed   By: Dorn Roulette M.D.   On: 06/15/2023 07:53       Assessment & Plan:  Essential hypertension, benign Assessment & Plan: Continue lisinopril .  Blood pressure as outlined. Continue to follow pressures.  No change in medication today.   Orders: -     Basic metabolic panel with GFR  Hypercholesterolemia Assessment & Plan: Continue pravastatin  and repatha . Low cholesterol diet and exercise.  Follow lipid panel and liver function tests. No changes made today.   Orders: -     Lipid panel -     Basic metabolic panel with GFR -     Hepatic function panel -     TSH  Anemia, unspecified type Assessment & Plan: Followed by hematology for IDA. Had capsule endoscopy 02/2022 - normal small intestine with no evidence of small bowels bleeding or lesions. Had f/u with Dr Melanee 09/18/22.  F/u with Dr Melanee 05/19/23 - ferritin 15 with iron saturation 15%. Iron infusion 05/31/23 and 06/07/23. Continue f/u with hematology. Request cbc and iron studies checked today given fatigue.   Orders: -     CBC with Differential/Platelet -     IBC + Ferritin  Seizure disorder (HCC) Assessment & Plan: No recent seizures.  Saw neurology.  S/p EEG.  Off lamictal . Doing well. Follow    Neck pain Assessment & Plan: Has seen neurosurgery.   MRI -   multilevel degenerative changes most concerning for facet arthropathy. PT.  Still with neck pain.  When increased, associated light headedness.  Continue exercises - PT and vestibular.  Given increased pain and persistent pain, was referred to Dr Avanell. Evaluated by Dr Avanell [facility]) 05/24/23 - recommended continued home exercise and MRI. MRI of the cervical spine dated 05/31/2023 revealed moderate bilateral foraminal stenosis at C4-5. Recommended ESI. S/p ESI 07/02/23. Has f/u 08/04/23.  Had f/u with  physiatry Merri) - 03/09/24 - f/u neck pain. Worsening pain recently. Recommended left C4-5 transforaminal ESI - scheduled for 03/28/24.    History of malignant neoplasm of oropharynx Assessment & Plan: S/p XRT and chemo.  Followed by oncology.    Gastroesophageal reflux disease, unspecified whether esophagitis present Assessment & Plan: No upper symptoms related. Continue prilosec.    Other fatigue Assessment & Plan: Reports some minimal increased fatigue. Request cbc and iron studies to be checked. Also check metabolic panel and thyroid .    Coronary artery disease involving native coronary artery of native heart with angina pectoris [facility]) Assessment & Plan: Had f/u with cardiology 07/07/23 - f/u triple vessel CAD. Continue aspirin , pravastatin , PCSK9 inhibitor. Graduated from cardiac rehab.  Overall appears to be stable.  Continue risk factor modification.    Atherosclerotic PVD with intermittent claudication (HCC) Assessment & Plan: S/p fem fem bypass.  Followed by [facility].  On eliquis .  Continue pravastatin  and repatha . Continue risk factor modification.    Aortic occlusion [facility]) Assessment & Plan: S/p fem fem bypass.  Followed by [facility] - vascular.  On eliquis .  Continue pravastatin  and repatha .       Allena Hamilton, MD

## 2024-03-24 NOTE — Assessment & Plan Note (Signed)
 Had f/u with cardiology 07/07/23 - f/u triple vessel CAD. Continue aspirin , pravastatin , PCSK9 inhibitor. Graduated from cardiac rehab.  Overall appears to be stable.  Continue risk factor modification.

## 2024-03-24 NOTE — Assessment & Plan Note (Signed)
 Continue pravastatin  and repatha . Low cholesterol diet and exercise.  Follow lipid panel and liver function tests. No changes made today.

## 2024-03-28 DIAGNOSIS — M5412 Radiculopathy, cervical region: Secondary | ICD-10-CM | POA: Diagnosis not present

## 2024-03-28 DIAGNOSIS — M4802 Spinal stenosis, cervical region: Secondary | ICD-10-CM | POA: Diagnosis not present

## 2024-03-29 ENCOUNTER — Ambulatory Visit: Payer: Self-pay | Admitting: Internal Medicine

## 2024-04-02 ENCOUNTER — Encounter: Payer: Self-pay | Admitting: Internal Medicine

## 2024-04-25 DIAGNOSIS — M4802 Spinal stenosis, cervical region: Secondary | ICD-10-CM | POA: Diagnosis not present

## 2024-04-25 DIAGNOSIS — M5412 Radiculopathy, cervical region: Secondary | ICD-10-CM | POA: Diagnosis not present

## 2024-05-02 ENCOUNTER — Other Ambulatory Visit: Payer: Self-pay | Admitting: Internal Medicine

## 2024-05-04 DIAGNOSIS — L2989 Other pruritus: Secondary | ICD-10-CM | POA: Diagnosis not present

## 2024-05-04 DIAGNOSIS — L538 Other specified erythematous conditions: Secondary | ICD-10-CM | POA: Diagnosis not present

## 2024-05-04 DIAGNOSIS — L820 Inflamed seborrheic keratosis: Secondary | ICD-10-CM | POA: Diagnosis not present

## 2024-05-04 DIAGNOSIS — C44619 Basal cell carcinoma of skin of left upper limb, including shoulder: Secondary | ICD-10-CM | POA: Diagnosis not present

## 2024-05-23 ENCOUNTER — Encounter: Payer: Self-pay | Admitting: Oncology

## 2024-05-23 ENCOUNTER — Inpatient Hospital Stay: Payer: Medicare Other | Admitting: Oncology

## 2024-05-23 ENCOUNTER — Inpatient Hospital Stay: Payer: Medicare Other | Attending: Oncology

## 2024-05-23 VITALS — BP 158/94 | HR 105 | Temp 97.6°F | Resp 16 | Wt 160.0 lb

## 2024-05-23 DIAGNOSIS — I739 Peripheral vascular disease, unspecified: Secondary | ICD-10-CM | POA: Diagnosis not present

## 2024-05-23 DIAGNOSIS — D509 Iron deficiency anemia, unspecified: Secondary | ICD-10-CM | POA: Insufficient documentation

## 2024-05-23 DIAGNOSIS — Z7901 Long term (current) use of anticoagulants: Secondary | ICD-10-CM | POA: Insufficient documentation

## 2024-05-23 DIAGNOSIS — Z85818 Personal history of malignant neoplasm of other sites of lip, oral cavity, and pharynx: Secondary | ICD-10-CM | POA: Diagnosis not present

## 2024-05-23 DIAGNOSIS — Z85828 Personal history of other malignant neoplasm of skin: Secondary | ICD-10-CM | POA: Insufficient documentation

## 2024-05-23 DIAGNOSIS — E7800 Pure hypercholesterolemia, unspecified: Secondary | ICD-10-CM | POA: Diagnosis not present

## 2024-05-23 DIAGNOSIS — K219 Gastro-esophageal reflux disease without esophagitis: Secondary | ICD-10-CM | POA: Insufficient documentation

## 2024-05-23 DIAGNOSIS — I10 Essential (primary) hypertension: Secondary | ICD-10-CM | POA: Insufficient documentation

## 2024-05-23 DIAGNOSIS — Z79899 Other long term (current) drug therapy: Secondary | ICD-10-CM | POA: Insufficient documentation

## 2024-05-23 LAB — CBC
HCT: 37.4 % — ABNORMAL LOW (ref 39.0–52.0)
Hemoglobin: 12.8 g/dL — ABNORMAL LOW (ref 13.0–17.0)
MCH: 27.8 pg (ref 26.0–34.0)
MCHC: 34.2 g/dL (ref 30.0–36.0)
MCV: 81.3 fL (ref 80.0–100.0)
Platelets: 366 K/uL (ref 150–400)
RBC: 4.6 MIL/uL (ref 4.22–5.81)
RDW: 13.4 % (ref 11.5–15.5)
WBC: 8.4 K/uL (ref 4.0–10.5)
nRBC: 0 % (ref 0.0–0.2)

## 2024-05-23 LAB — FERRITIN: Ferritin: 193 ng/mL (ref 24–336)

## 2024-05-23 LAB — IRON AND TIBC
Iron: 55 ug/dL (ref 45–182)
Saturation Ratios: 26 % (ref 17.9–39.5)
TIBC: 211 ug/dL — ABNORMAL LOW (ref 250–450)
UIBC: 156 ug/dL

## 2024-05-23 NOTE — Progress Notes (Signed)
 Hematology/Oncology Consult note [facility]  [phone] [phone]  Patient Care Team: Glendia Shad, MD as PCP - General (Internal Medicine) Aundria Ladell POUR, MD as Consulting Physician (Gastroenterology) Maree Jannett POUR, MD as Consulting Physician (Neurology)   Name of the patient: Timothy Garrison  [id number]  1960-10-18   Date of visit: 05/23/24  Diagnosis-history of stage I HPV positive oropharyngeal cancer in 2018 currently in remission History of iron deficiency anemia  Chief complaint/ Reason for visit-routine follow-up of iron deficiency anemia  Heme/Onc history:  Patient is a 63  yr old male with h/o Stage I HPV positive oropharyngeal cancer in 2018. He only received 2 weekly doses of cisplatin  back then following which he developed limb threatenign acute aortic thrombosis and required intervention by vascular surgery. RT was cut short by 1 week back then. PET showed NED in October 2018 after treatment.    Patient has now been referred for his anemia.  His most recent labs from 12/02/2021 showed white cell count of 6.4, H&H of 10.6/33.1 with an MCV of 78 and a platelet count of 449.  Iron studies showed a low ferritin of 8.8 B12 levels were normal at 355.  Overall his hemoglobin is gradually drifted down from 12 a year ago to presently 10.6.  He denies any blood loss in his stool or urine.   Patient underwent EGD and colonoscopy which did not show any active bleeding.  Also underwent capsule endoscopy in July 2023 which did not show any evidence of active bleeding  Interval history- He has a history of head and neck cancer diagnosed in 2018 and continues to follow up with ENT specialists for routine exams. The frequency of these visits has decreased over time, and he is now seven years post-diagnosis, reporting doing well from this standpoint.  Overall he is doing well. Denies any specific complaints at this time.  He is on Eliquis   for his peripheral vascular disease  ECOG PS- 1 Pain scale- 0   Review of systems- Review of Systems  Constitutional:  Negative for chills, fever, malaise/fatigue and weight loss.  HENT:  Negative for congestion, ear discharge and nosebleeds.   Eyes:  Negative for blurred vision.  Respiratory:  Negative for cough, hemoptysis, sputum production, shortness of breath and wheezing.   Cardiovascular:  Negative for chest pain, palpitations, orthopnea and claudication.  Gastrointestinal:  Negative for abdominal pain, blood in stool, constipation, diarrhea, heartburn, melena, nausea and vomiting.  Genitourinary:  Negative for dysuria, flank pain, frequency, hematuria and urgency.  Musculoskeletal:  Negative for back pain, joint pain and myalgias.  Skin:  Negative for rash.  Neurological:  Negative for dizziness, tingling, focal weakness, seizures, weakness and headaches.  Endo/Heme/Allergies:  Does not bruise/bleed easily.  Psychiatric/Behavioral:  Negative for depression and suicidal ideas. The patient does not have insomnia.       Allergies  Allergen Reactions   Atorvastatin       Past Medical History:  Diagnosis Date   Basal cell carcinoma (BCC) of back    Basal cell carcinoma (BCC) of nasal sidewall    Dizziness    GERD (gastroesophageal reflux disease)    H/O blood clots    Headache    migraines   Hypercholesterolemia    Hypertension    hx of HBP readings   Metastatic squamous cell carcinoma 2018   bx cervical lymph node   Seizures (HCC) 2021   Tonsillar cancer (HCC)    Vascular abnormality  VTE (venous thromboembolism) 2018   associated with chemotherapy     Past Surgical History:  Procedure Laterality Date   COLONOSCOPY     COLONOSCOPY N/A 05/08/2021   Procedure: COLONOSCOPY;  Surgeon: Onita Elspeth Sharper, DO;  Location: [facility] ENDOSCOPY;  Service: Gastroenterology;  Laterality: N/A;   endovascular balloon angioplasty femoral/popliteal artery w/stent Left  11/08/2017   ESOPHAGOGASTRODUODENOSCOPY N/A 05/08/2021   Procedure: ESOPHAGOGASTRODUODENOSCOPY (EGD);  Surgeon: Onita Elspeth Sharper, DO;  Location: [facility] ENDOSCOPY;  Service: Gastroenterology;  Laterality: N/A;   ESOPHAGOGASTRODUODENOSCOPY (EGD) WITH PROPOFOL  N/A 05/24/2015   Procedure: ESOPHAGOGASTRODUODENOSCOPY (EGD) WITH PROPOFOL ;  Surgeon: Lamar ONEIDA Holmes, MD;  Location: [facility] ENDOSCOPY;  Service: Endoscopy;  Laterality: N/A;   FEMORAL-FEMORAL BYPASS GRAFT     IR IMAGING GUIDED PORT INSERTION  12/09/2016   LEFT HEART CATH AND CORONARY ANGIOGRAPHY N/A 12/25/2019   Procedure: LEFT HEART CATH AND CORONARY ANGIOGRAPHY;  Surgeon: Hester Wolm PARAS, MD;  Location: [facility] INVASIVE CV LAB;  Service: Cardiovascular;  Laterality: N/A;   SAVORY DILATION N/A 05/24/2015   Procedure: SAVORY DILATION;  Surgeon: Lamar ONEIDA Holmes, MD;  Location: [facility] ENDOSCOPY;  Service: Endoscopy;  Laterality: N/A;   skin cancer removed from face   2024    Social History   Socioeconomic History   Marital status: Married    Spouse name: Not on file   Number of children: Not on file   Years of education: Not on file   Highest education level: Not on file  Occupational History   Occupation: Curator  Tobacco Use   Smoking status: Former    Current packs/day: 0.00    Types: Cigarettes    Quit date: 11/11/1996    Years since quitting: 27.5   Smokeless tobacco: Never  Vaping Use   Vaping status: Never Used  Substance and Sexual Activity   Alcohol use: Yes    Comment: occasional   Drug use: No   Sexual activity: Yes  Other Topics Concern   Not on file  Social History Narrative   Married   Social Drivers of Health   Financial Resource Strain: Low Risk  (06/25/2023)   Overall Financial Resource Strain (CARDIA)    Difficulty of Paying Living Expenses: Not hard at all  Food Insecurity: No Food Insecurity (06/25/2023)   Hunger Vital Sign    Worried About Running Out of Food in the Last Year: Never true    Ran  Out of Food in the Last Year: Never true  Transportation Needs: No Transportation Needs (06/25/2023)   PRAPARE - Administrator, Civil Service (Medical): No    Lack of Transportation (Non-Medical): No  Physical Activity: Insufficiently Active (06/25/2023)   Exercise Vital Sign    Days of Exercise per Week: 3 days    Minutes of Exercise per Session: 30 min  Stress: No Stress Concern Present (06/25/2023)   [employer] of Occupational Health - Occupational Stress Questionnaire    Feeling of Stress : Only a little  Social Connections: Moderately Isolated (06/25/2023)   Social Connection and Isolation Panel    Frequency of Communication with Friends and Family: More than three times a week    Frequency of Social Gatherings with Friends and Family: Three times a week    Attends Religious Services: Never    Active Member of Clubs or Organizations: No    Attends Banker Meetings: Never    Marital Status: Married  Catering manager Violence: Not At Risk (06/25/2023)   Humiliation, Afraid, Rape, and  Kick questionnaire    Fear of Current or Ex-Partner: No    Emotionally Abused: No    Physically Abused: No    Sexually Abused: No    Family History  Problem Relation Age of Onset   Hyperlipidemia Mother    Heart disease Mother    Hypertension Mother    Alzheimer's disease Mother    Prostate cancer Father    Stroke Father      Current Outpatient Medications:    apixaban  (ELIQUIS ) 5 MG TABS tablet, Take 1 tablet (5 mg total) by mouth every 12 (twelve) hours., Disp: 180 tablet, Rfl: 1   aspirin  EC 81 MG tablet, Take 81 mg by mouth daily. Swallow whole., Disp: , Rfl:    busPIRone  (BUSPAR ) 5 MG tablet, Take 1 tablet (5 mg total) by mouth daily as needed., Disp: 30 tablet, Rfl: 1   lisinopril  (ZESTRIL ) 40 MG tablet, TAKE 1 TABLET BY MOUTH DAILY, Disp: 90 tablet, Rfl: 3   Multiple Vitamin (MULTIVITAMIN) capsule, Take by mouth., Disp: , Rfl:    nitroGLYCERIN  (NITROSTAT )  0.4 MG SL tablet, Place 0.4 mg under the tongue every 5 (five) minutes as needed for chest pain., Disp: , Rfl:    omeprazole  (PRILOSEC) 20 MG capsule, Take 1 capsule (20 mg total) by mouth 2 (two) times daily., Disp: 180 capsule, Rfl: 3   pravastatin  (PRAVACHOL ) 40 MG tablet, Take 1 tablet by mouth daily., Disp: , Rfl:    REPATHA  SURECLICK 140 MG/ML SOAJ, INJECT 140 MG INTO THE SKIN EVERY 14 DAYS, Disp: 2 mL, Rfl: 2  Physical exam:  Vitals:   05/23/24 1056  BP: (!) 158/94  Pulse: (!) 105  Resp: 16  Temp: 97.6 F (36.4 C)  TempSrc: Tympanic  SpO2: 100%  Weight: 160 lb (72.6 kg)   Physical Exam Cardiovascular:     Rate and Rhythm: Normal rate and regular rhythm.     Heart sounds: Normal heart sounds.  Pulmonary:     Effort: Pulmonary effort is normal.     Breath sounds: Normal breath sounds.  Skin:    General: Skin is warm and dry.  Neurological:     Mental Status: He is alert and oriented to person, place, and time.      I have personally reviewed labs listed below:    Latest Ref Rng & Units 03/24/2024    8:07 AM  CMP  Glucose 70 - 99 mg/dL 94   BUN 6 - 23 mg/dL 16   Creatinine 9.59 - 1.50 mg/dL 8.90   Sodium 864 - 854 mEq/L 137   Potassium 3.5 - 5.1 mEq/L 4.9   Chloride 96 - 112 mEq/L 99   CO2 19 - 32 mEq/L 30   Calcium  8.4 - 10.5 mg/dL 9.6   Total Protein 6.0 - 8.3 g/dL 7.2   Total Bilirubin 0.2 - 1.2 mg/dL 0.4   Alkaline Phos 39 - 117 U/L 62   AST 0 - 37 U/L 21   ALT 0 - 53 U/L 29       Latest Ref Rng & Units 05/23/2024   10:46 AM  CBC  WBC 4.0 - 10.5 K/uL 8.4   Hemoglobin 13.0 - 17.0 g/dL 87.1   Hematocrit 60.9 - 52.0 % 37.4   Platelets 150 - 400 K/uL 366      Assessment and plan- Patient is a 63 y.o. male here for routine follow-up of iron deficiency anemia  Iron deficiency anemia Iron deficiency anemia with history of IV iron supplementation.  Hemoglobin stable at 12.8. Awaiting iron studies for ferritin assessment. - Check ferritin levels today. -  Administer IV iron if ferritin < 50. - Monitor iron levels every six months. - Schedule follow-up in one year.  History of stage I HPV positive oropharyngeal cancer: Currently remains in remission.  Continue to follow-up with ENT   Visit Diagnosis 1. Iron deficiency anemia, unspecified iron deficiency anemia type      Dr. Annah Skene, MD, MPH [facility] [phone] 05/23/2024 11:07 AM

## 2024-05-30 DIAGNOSIS — I6521 Occlusion and stenosis of right carotid artery: Secondary | ICD-10-CM | POA: Diagnosis not present

## 2024-05-30 DIAGNOSIS — Z95828 Presence of other vascular implants and grafts: Secondary | ICD-10-CM | POA: Diagnosis not present

## 2024-05-30 DIAGNOSIS — I745 Embolism and thrombosis of iliac artery: Secondary | ICD-10-CM | POA: Diagnosis not present

## 2024-06-01 ENCOUNTER — Other Ambulatory Visit: Payer: Self-pay | Admitting: Internal Medicine

## 2024-07-11 ENCOUNTER — Ambulatory Visit: Payer: Self-pay | Admitting: Internal Medicine

## 2024-08-03 ENCOUNTER — Other Ambulatory Visit: Payer: Self-pay | Admitting: Internal Medicine

## 2024-08-04 ENCOUNTER — Encounter: Admitting: Internal Medicine

## 2024-08-04 ENCOUNTER — Encounter: Payer: Self-pay | Admitting: Internal Medicine

## 2024-08-04 ENCOUNTER — Ambulatory Visit: Admitting: Internal Medicine

## 2024-08-04 VITALS — BP 120/74 | HR 64 | Temp 98.2°F | Ht 67.5 in | Wt 164.8 lb

## 2024-08-04 DIAGNOSIS — Z0000 Encounter for general adult medical examination without abnormal findings: Secondary | ICD-10-CM | POA: Diagnosis not present

## 2024-08-04 DIAGNOSIS — K219 Gastro-esophageal reflux disease without esophagitis: Secondary | ICD-10-CM | POA: Diagnosis not present

## 2024-08-04 DIAGNOSIS — I70219 Atherosclerosis of native arteries of extremities with intermittent claudication, unspecified extremity: Secondary | ICD-10-CM

## 2024-08-04 DIAGNOSIS — G40909 Epilepsy, unspecified, not intractable, without status epilepticus: Secondary | ICD-10-CM | POA: Diagnosis not present

## 2024-08-04 DIAGNOSIS — E7800 Pure hypercholesterolemia, unspecified: Secondary | ICD-10-CM

## 2024-08-04 DIAGNOSIS — F419 Anxiety disorder, unspecified: Secondary | ICD-10-CM | POA: Diagnosis not present

## 2024-08-04 DIAGNOSIS — Z85819 Personal history of malignant neoplasm of unspecified site of lip, oral cavity, and pharynx: Secondary | ICD-10-CM | POA: Diagnosis not present

## 2024-08-04 DIAGNOSIS — I25119 Atherosclerotic heart disease of native coronary artery with unspecified angina pectoris: Secondary | ICD-10-CM

## 2024-08-04 DIAGNOSIS — D508 Other iron deficiency anemias: Secondary | ICD-10-CM

## 2024-08-04 DIAGNOSIS — I10 Essential (primary) hypertension: Secondary | ICD-10-CM

## 2024-08-04 LAB — BASIC METABOLIC PANEL WITH GFR
BUN: 15 mg/dL (ref 6–23)
CO2: 28 meq/L (ref 19–32)
Calcium: 9.8 mg/dL (ref 8.4–10.5)
Chloride: 102 meq/L (ref 96–112)
Creatinine, Ser: 1.1 mg/dL (ref 0.40–1.50)
GFR: 71.15 mL/min
Glucose, Bld: 95 mg/dL (ref 70–99)
Potassium: 4.6 meq/L (ref 3.5–5.1)
Sodium: 138 meq/L (ref 135–145)

## 2024-08-04 LAB — LIPID PANEL
Cholesterol: 141 mg/dL (ref 28–200)
HDL: 49.2 mg/dL
LDL Cholesterol: 60 mg/dL (ref 10–99)
NonHDL: 92.29
Total CHOL/HDL Ratio: 3
Triglycerides: 159 mg/dL — ABNORMAL HIGH (ref 10.0–149.0)
VLDL: 31.8 mg/dL (ref 0.0–40.0)

## 2024-08-04 LAB — HEPATIC FUNCTION PANEL
ALT: 26 U/L (ref 3–53)
AST: 24 U/L (ref 5–37)
Albumin: 4.5 g/dL (ref 3.5–5.2)
Alkaline Phosphatase: 46 U/L (ref 39–117)
Bilirubin, Direct: 0.1 mg/dL (ref 0.1–0.3)
Total Bilirubin: 0.4 mg/dL (ref 0.2–1.2)
Total Protein: 7 g/dL (ref 6.0–8.3)

## 2024-08-04 MED ORDER — REPATHA SURECLICK 140 MG/ML ~~LOC~~ SOAJ: 140.0000 mg | SUBCUTANEOUS | 2 refills | Status: AC

## 2024-08-04 MED ORDER — APIXABAN 5 MG PO TABS: 5.0000 mg | ORAL_TABLET | Freq: Two times a day (BID) | ORAL | 1 refills | Status: AC

## 2024-08-04 MED ORDER — BUSPIRONE HCL 5 MG PO TABS: 5.0000 mg | ORAL_TABLET | Freq: Every day | ORAL | 1 refills | Status: AC | PRN

## 2024-08-04 NOTE — Progress Notes (Signed)
 "  Subjective:    [name], male    DOB: 08-12-1961, 63 y.o.   MRN: [id number]  Patient here for  Chief Complaint  Patient presents with   Annual Exam    HPI Here for a physical exam. Had f/u with vascular surgery 08/01/24 - stable. Recommended f/u one year with repeat LE arterial study and carotid duplex. Saw cardiology 07/04/24 - cardiac murmur - recommended echo. EKG - ST. Continue aspirin , pravastatin , PCSK9 inhibitor. Had f/u with oncology 05/23/24 - follow up head and neck cancer - in remission. Continue f/u with ENT. Also followed for IDA. Hgb stable 12.8. IV iron if ferritin <50. Had f/u with physiatry 04/25/24 - continue tylenol . Home exercise. Overall feels things are stable. No chest pain. Breathing stable. No abdominal pain or bowel change reported. He is walking. Some limitations with hils, but is riding recumbent bike. Overall stable.    Past Medical History:  Diagnosis Date   Basal cell carcinoma (BCC) of back    Basal cell carcinoma (BCC) of nasal sidewall    Dizziness    GERD (gastroesophageal reflux disease)    H/O blood clots    Headache    migraines   Hypercholesterolemia    Hypertension    hx of HBP readings   Metastatic squamous cell carcinoma 2018   bx cervical lymph node   Seizures (HCC) 2021   Tonsillar cancer (HCC)    Vascular abnormality    VTE (venous thromboembolism) 2018   associated with chemotherapy   Past Surgical History:  Procedure Laterality Date   COLONOSCOPY     COLONOSCOPY N/A 05/08/2021   Procedure: COLONOSCOPY;  Surgeon: Onita Elspeth Sharper, DO;  Location: [facility] ENDOSCOPY;  Service: Gastroenterology;  Laterality: N/A;   endovascular balloon angioplasty femoral/popliteal artery w/stent Left 11/08/2017   ESOPHAGOGASTRODUODENOSCOPY N/A 05/08/2021   Procedure: ESOPHAGOGASTRODUODENOSCOPY (EGD);  Surgeon: Onita Elspeth Sharper, DO;  Location: [facility] ENDOSCOPY;  Service: Gastroenterology;  Laterality: N/A;    ESOPHAGOGASTRODUODENOSCOPY (EGD) WITH PROPOFOL  N/A 05/24/2015   Procedure: ESOPHAGOGASTRODUODENOSCOPY (EGD) WITH PROPOFOL ;  Surgeon: Lamar ONEIDA Holmes, MD;  Location: [facility] ENDOSCOPY;  Service: Endoscopy;  Laterality: N/A;   FEMORAL-FEMORAL BYPASS GRAFT     IR IMAGING GUIDED PORT INSERTION  12/09/2016   LEFT HEART CATH AND CORONARY ANGIOGRAPHY N/A 12/25/2019   Procedure: LEFT HEART CATH AND CORONARY ANGIOGRAPHY;  Surgeon: Hester Wolm PARAS, MD;  Location: [facility] INVASIVE CV LAB;  Service: Cardiovascular;  Laterality: N/A;   SAVORY DILATION N/A 05/24/2015   Procedure: SAVORY DILATION;  Surgeon: Lamar ONEIDA Holmes, MD;  Location: [facility] ENDOSCOPY;  Service: Endoscopy;  Laterality: N/A;   skin cancer removed from face   2024   Family History  Problem Relation Age of Onset   Hyperlipidemia Mother    Heart disease Mother    Hypertension Mother    Alzheimer's disease Mother    Prostate cancer Father    Stroke Father    Social History   Socioeconomic History   Marital status: Married    Spouse name: Not on file   Number of children: Not on file   Years of education: Not on file   Highest education level: Not on file  Occupational History   Occupation: curator  Tobacco Use   Smoking status: Former    Current packs/day: 0.00    Types: Cigarettes    Quit date: 11/11/1996    Years since quitting: 27.7   Smokeless tobacco: Never  Vaping Use   Vaping status: Never Used  Substance and Sexual Activity   Alcohol use: Yes    Comment: occasional   Drug use: No   Sexual activity: Yes  Other Topics Concern   Not on file  Social History Narrative   Married   Social Drivers of Health   Tobacco Use: Medium Risk (08/13/2024)   Patient History    Smoking Tobacco Use: Former    Smokeless Tobacco Use: Never    Passive Exposure: Not on file  Financial Resource Strain: Low Risk (06/25/2023)   Overall Financial Resource Strain (CARDIA)    Difficulty of Paying Living Expenses: Not hard at all  Food  Insecurity: No Food Insecurity (06/25/2023)   Hunger Vital Sign    Worried About Running Out of Food in the Last Year: Never true    Ran Out of Food in the Last Year: Never true  Transportation Needs: No Transportation Needs (06/25/2023)   PRAPARE - Administrator, Civil Service (Medical): No    Lack of Transportation (Non-Medical): No  Physical Activity: Insufficiently Active (06/25/2023)   Exercise Vital Sign    Days of Exercise per Week: 3 days    Minutes of Exercise per Session: 30 min  Stress: No Stress Concern Present (06/25/2023)   [employer] of Occupational Health - Occupational Stress Questionnaire    Feeling of Stress : Only a little  Social Connections: Moderately Isolated (06/25/2023)   Social Connection and Isolation Panel    Frequency of Communication with Friends and Family: More than three times a week    Frequency of Social Gatherings with Friends and Family: Three times a week    Attends Religious Services: Never    Active Member of Clubs or Organizations: No    Attends Banker Meetings: Never    Marital Status: Married  Depression (PHQ2-9): Low Risk (05/23/2024)   Depression (PHQ2-9)    PHQ-2 Score: 0  Alcohol Screen: Low Risk (06/25/2023)   Alcohol Screen    Last Alcohol Screening Score (AUDIT): 2  Housing: Unknown (02/01/2024)   Received from [facility]   Epic    Unable to Pay for Housing in the Last Year: Not on file    Number of Times Moved in the Last Year: Not on file    At any time in the past 12 months, were you homeless or living in a shelter (including now)?: No  Utilities: Not At Risk (06/25/2023)   [facility] Utilities    Threatened with loss of utilities: No  Health Literacy: Adequate Health Literacy (06/25/2023)   B1300 Health Literacy    Frequency of need for help with medical instructions: Never     Review of Systems  Constitutional:  Negative for appetite change and unexpected weight change.  HENT:   Negative for congestion, sinus pressure and sore throat.   Eyes:  Negative for pain and visual disturbance.  Respiratory:  Negative for cough and chest tightness.        Breathing stable.   Cardiovascular:  Negative for chest pain, palpitations and leg swelling.  Gastrointestinal:  Negative for abdominal pain, diarrhea, nausea and vomiting.  Genitourinary:  Negative for difficulty urinating and dysuria.  Musculoskeletal:  Negative for joint swelling and myalgias.  Skin:  Negative for color change and rash.  Neurological:  Negative for dizziness and headaches.  Hematological:  Negative for adenopathy. Does not bruise/bleed easily.  Psychiatric/Behavioral:  Negative for agitation and dysphoric mood.        Objective:  BP 120/74   Pulse 64   Temp 98.2 F (36.8 C) (Oral)   Ht 5' 7.5 (1.715 m)   Wt 164 lb 12.8 oz (74.8 kg)   SpO2 99%   BMI 25.43 kg/m  Wt Readings from Last 3 Encounters:  08/04/24 164 lb 12.8 oz (74.8 kg)  05/23/24 160 lb (72.6 kg)  03/24/24 158 lb 9.6 oz (71.9 kg)    Physical Exam Constitutional:      General: He is not in acute distress.    Appearance: Normal appearance. He is well-developed.  HENT:     Head: Normocephalic and atraumatic.     Right Ear: External ear normal.     Left Ear: External ear normal.     Mouth/Throat:     Pharynx: No oropharyngeal exudate or posterior oropharyngeal erythema.  Eyes:     General: No scleral icterus.       Right eye: No discharge.        Left eye: No discharge.     Conjunctiva/sclera: Conjunctivae normal.  Neck:     Thyroid : No thyromegaly.  Cardiovascular:     Rate and Rhythm: Normal rate and regular rhythm.  Pulmonary:     Effort: No respiratory distress.     Breath sounds: Normal breath sounds. No wheezing.  Abdominal:     General: Bowel sounds are normal.     Palpations: Abdomen is soft.     Tenderness: There is no abdominal tenderness.  Musculoskeletal:        General: No swelling or tenderness.      Cervical back: Neck supple. No tenderness.  Lymphadenopathy:     Cervical: No cervical adenopathy.  Skin:    Findings: No erythema or rash.  Neurological:     Mental Status: He is alert and oriented to person, place, and time.  Psychiatric:        Mood and Affect: Mood normal.        Behavior: Behavior normal.         Outpatient Encounter Medications as of 08/04/2024  Medication Sig   aspirin  EC 81 MG tablet Take 81 mg by mouth daily. Swallow whole.   lisinopril  (ZESTRIL ) 40 MG tablet TAKE 1 TABLET BY MOUTH DAILY   Multiple Vitamin (MULTIVITAMIN) capsule Take by mouth.   nitroGLYCERIN  (NITROSTAT ) 0.4 MG SL tablet Place 0.4 mg under the tongue every 5 (five) minutes as needed for chest pain.   omeprazole  (PRILOSEC) 20 MG capsule TAKE 1 CAPSULE BY MOUTH TWO TIMES DAILY   pravastatin  (PRAVACHOL ) 40 MG tablet Take 1 tablet by mouth daily.   [DISCONTINUED] apixaban  (ELIQUIS ) 5 MG TABS tablet Take 1 tablet (5 mg total) by mouth every 12 (twelve) hours.   [DISCONTINUED] busPIRone  (BUSPAR ) 5 MG tablet Take 1 tablet (5 mg total) by mouth daily as needed.   [DISCONTINUED] REPATHA  SURECLICK 140 MG/ML SOAJ INJECT 140 MG INTO THE SKIN EVERY 14 DAYS   apixaban  (ELIQUIS ) 5 MG TABS tablet Take 1 tablet (5 mg total) by mouth every 12 (twelve) hours.   busPIRone  (BUSPAR ) 5 MG tablet Take 1 tablet (5 mg total) by mouth daily as needed.   Evolocumab  (REPATHA  SURECLICK) 140 MG/ML SOAJ Inject 140 mg into the skin every 14 (fourteen) days.   No facility-administered encounter medications on file as of 08/04/2024.     Lab Results  Component Value Date   WBC 8.4 05/23/2024   HGB 12.8 (L) 05/23/2024   HCT 37.4 (L) 05/23/2024   PLT 366 05/23/2024  GLUCOSE 95 08/04/2024   CHOL 141 08/04/2024   TRIG 159.0 (H) 08/04/2024   HDL 49.20 08/04/2024   LDLDIRECT 163.0 09/08/2022   LDLCALC 60 08/04/2024   ALT 26 08/04/2024   AST 24 08/04/2024   NA 138 08/04/2024   K 4.6 08/04/2024   CL 102  08/04/2024   CREATININE 1.10 08/04/2024   BUN 15 08/04/2024   CO2 28 08/04/2024   TSH 2.74 03/24/2024   PSA 1.55 08/06/2023   INR 1.1 12/23/2019   HGBA1C 6.0 (H) 05/23/2019    MR CERVICAL SPINE WO CONTRAST Result Date: 06/15/2023 CLINICAL DATA:  Cervical radiculitis. EXAM: MRI CERVICAL SPINE WITHOUT CONTRAST TECHNIQUE: Multiplanar, multisequence MR imaging of the cervical spine was performed. No intravenous contrast was administered. COMPARISON:  03/30/2021 FINDINGS: Alignment: Mild C2-3, C3-4 and C7-T1 degenerative anterolisthesis. Vertebrae: No fracture, evidence of discitis, or bone lesion. Cord: Normal signal and morphology. Posterior Fossa, vertebral arteries, paraspinal tissues: Negative. Disc levels: Challenging assessment of the foramina on axial images especially due to blooming on gradient series. C2-3: Degenerative facet spurring asymmetric to the left. C3-4: Degenerative facet spurring with mild anterolisthesis. Small uncovertebral spurs. Mild left foraminal narrowing by T2 weighted imaging. C4-5: Disc space narrowing with uncovertebral ridging. Degenerative facet spurring on both sides. Mild or moderate foraminal narrowing based on axial T2 weighted imaging C5-6: Mild disc bulging.  No neural impingement. C6-7: Mild disc bulging.  No neural impingement. C7-T1:Degenerative facet spurring asymmetric to the right. IMPRESSION: 1. Generalized cervical spine degeneration similar to 2022, as described. 2. Mild to moderate left foraminal narrowing at C4-5. Widely patent spinal canal. Electronically Signed   By: Dorn Roulette M.D.   On: 06/15/2023 07:53       Assessment & Plan:  Hypercholesterolemia Assessment & Plan: Continue pravastatin  and repatha . Low cholesterol diet and exercise.  Follow lipid panel and liver function tests. No change in medication today.   Orders: -     Basic metabolic panel with GFR -     Hepatic function panel -     Lipid panel  Seizure disorder  [facility]) Assessment & Plan: No recent seizures.  Saw neurology.  S/p EEG.  Off lamictal . Has done well. Follow.    Other iron deficiency anemia Assessment & Plan: Followed for IDA. Hgb stable 12.8. IV iron if ferritin <50.    History of malignant neoplasm of oropharynx Assessment & Plan: S/p XRT and chemo.  Followed by oncology.    Health care maintenance Assessment & Plan: Physical today 08/04/24.  Colonoscopy 04/2021.  PSA 08/06/23 - 1.55.  check psa with next labs    Gastroesophageal reflux disease, unspecified whether esophagitis present Assessment & Plan: No upper symptoms. Continue prilosec.    Essential hypertension, benign Assessment & Plan: Continue lisinopril .  Blood pressure as outlined. No change in medication today. Follow pressures.    Coronary artery disease involving native coronary artery of native heart with angina pectoris Assessment & Plan: Saw cardiology 07/04/24 - cardiac murmur - recommended echo. EKG - ST. Continue aspirin , pravastatin , PCSK9 inhibitor   Atherosclerotic PVD with intermittent claudication Assessment & Plan: S/p fem fem bypass.  Followed by [facility].  On eliquis .  Continue pravastatin  and repatha . Continue risk factor modification.  Had f/u with vascular surgery 08/01/24 - stable. Recommended f/u one year with repeat LE arterial study and carotid duplex.   Anxiety Assessment & Plan: Overall appears to be doing well.  Has BuSpar  to take as needed.   Other orders -  Apixaban ; Take 1 tablet (5 mg total) by mouth every 12 (twelve) hours.  Dispense: 180 tablet; Refill: 1 -     busPIRone  HCl; Take 1 tablet (5 mg total) by mouth daily as needed.  Dispense: 30 tablet; Refill: 1 -     Repatha  SureClick; Inject 140 mg into the skin every 14 (fourteen) days.  Dispense: 2 mL; Refill: 2     Allena Hamilton, MD "

## 2024-08-07 ENCOUNTER — Ambulatory Visit: Payer: Self-pay | Admitting: Internal Medicine

## 2024-08-13 ENCOUNTER — Encounter: Payer: Self-pay | Admitting: Internal Medicine

## 2024-08-13 NOTE — Assessment & Plan Note (Signed)
 S/p XRT and chemo.  Followed by oncology.

## 2024-08-13 NOTE — Assessment & Plan Note (Signed)
 Followed for IDA. Hgb stable 12.8. IV iron if ferritin <50.

## 2024-08-13 NOTE — Assessment & Plan Note (Signed)
 Continue pravastatin  and repatha . Low cholesterol diet and exercise.  Follow lipid panel and liver function tests. No change in medication today.

## 2024-08-13 NOTE — Assessment & Plan Note (Signed)
 Physical today 08/04/24.  Colonoscopy 04/2021.  PSA 08/06/23 - 1.55.  check psa with next labs

## 2024-08-13 NOTE — Assessment & Plan Note (Signed)
 Continue lisinopril .  Blood pressure as outlined. No change in medication today. Follow pressures.

## 2024-08-13 NOTE — Assessment & Plan Note (Signed)
 S/p fem fem bypass.  Followed by [facility].  On eliquis .  Continue pravastatin  and repatha . Continue risk factor modification.  Had f/u with vascular surgery 08/01/24 - stable. Recommended f/u one year with repeat LE arterial study and carotid duplex.

## 2024-08-13 NOTE — Assessment & Plan Note (Signed)
 No recent seizures.  Saw neurology.  S/p EEG.  Off lamictal . Has done well. Follow.

## 2024-08-13 NOTE — Assessment & Plan Note (Signed)
No upper symptoms.  Continue prilosec.  

## 2024-08-13 NOTE — Assessment & Plan Note (Signed)
 Overall appears to be doing well.  Has BuSpar  to take as needed.

## 2024-08-13 NOTE — Assessment & Plan Note (Signed)
 Saw cardiology 07/04/24 - cardiac murmur - recommended echo. EKG - ST. Continue aspirin , pravastatin , PCSK9 inhibitor

## 2024-08-25 ENCOUNTER — Ambulatory Visit

## 2024-08-25 DIAGNOSIS — Z0000 Encounter for general adult medical examination without abnormal findings: Secondary | ICD-10-CM

## 2024-08-25 NOTE — Progress Notes (Signed)
 "  Chief Complaint  Patient presents with   Medicare Wellness     Subjective:   Timothy Garrison is a 64 y.o. male who presents for a Medicare Annual Wellness Visit.  Visit info / Clinical Intake: Medicare Wellness Visit Type:: Subsequent Annual Wellness Visit Persons participating in visit and providing information:: patient Medicare Wellness Visit Mode:: Telephone If telephone:: video declined Since this visit was completed virtually, some vitals may be partially provided or unavailable. Missing vitals are due to the limitations of the virtual format.: Unable to obtain vitals - no equipment If Telephone or Video please confirm:: I connected with patient using audio/video enable telemedicine. I verified patient identity with two identifiers, discussed telehealth limitations, and patient agreed to proceed. Patient Location:: HOME Provider Location:: HOME Interpreter Needed?: No Pre-visit prep was completed: yes AWV questionnaire completed by patient prior to visit?: no Living arrangements:: lives with spouse/significant other Patient's Overall Health Status Rating: very good Typical amount of pain: none Does pain affect daily life?: no Are you currently prescribed opioids?: no  Dietary Habits and Nutritional Risks How many meals a day?: 3 Eats fruit and vegetables daily?: yes Most meals are obtained by: preparing own meals; having others provide food; eating out In the last 2 weeks, have you had any of the following?: none Diabetic:: no  Functional Status Activities of Daily Living (to include ambulation/medication): Independent Ambulation: Independent Medication Administration: Independent Home Management (perform basic housework or laundry): Independent Manage your own finances?: yes Primary transportation is: driving Concerns about vision?: no *vision screening is required for WTM* Concerns about hearing?: no  Fall Screening Falls in the past year?: 0 Number of falls  in past year: 0 Was there an injury with Fall?: 0 Fall Risk Category Calculator: 0 Patient Fall Risk Level: Low Fall Risk  Fall Risk Patient at Risk for Falls Due to: No Fall Risks Fall risk Follow up: Falls evaluation completed  Home and Transportation Safety: All rugs have non-skid backing?: yes All stairs or steps have railings?: yes Grab bars in the bathtub or shower?: yes Have non-skid surface in bathtub or shower?: yes Good home lighting?: yes Regular seat belt use?: yes Hospital stays in the last year:: (!) yes How many hospital stays:: 1 Reason: Gallbladder removal  Cognitive Assessment Difficulty concentrating, remembering, or making decisions? : no Will 6CIT or Mini Cog be Completed: yes What year is it?: 0 points What month is it?: 0 points About what time is it?: 0 points Count backwards from 20 to 1: 0 points Say the months of the year in reverse: 0 points Repeat the address phrase from earlier: 0 points 6 CIT Score: 0 points  Advance Directives (For Healthcare) Does Patient Have a Medical Advance Directive?: Yes Does patient want to make changes to medical advance directive?: No - Guardian declined Type of Advance Directive: Living will; Healthcare Power of Attorney Copy of Healthcare Power of Attorney in Chart?: No - copy requested Copy of Living Will in Chart?: No - copy requested  Reviewed/Updated  Reviewed/Updated: Reviewed All (Medical, Surgical, Family, Medications, Allergies, Care Teams, Patient Goals)    Allergies (verified) Atorvastatin    Current Medications (verified) Outpatient Encounter Medications as of 08/25/2024  Medication Sig   apixaban  (ELIQUIS ) 5 MG TABS tablet Take 1 tablet (5 mg total) by mouth every 12 (twelve) hours.   aspirin  EC 81 MG tablet Take 81 mg by mouth daily. Swallow whole.   busPIRone  (BUSPAR ) 5 MG tablet Take 1 tablet (5 mg total)  by mouth daily as needed.   Evolocumab  (REPATHA  SURECLICK) 140 MG/ML SOAJ Inject 140 mg  into the skin every 14 (fourteen) days.   lisinopril  (ZESTRIL ) 40 MG tablet TAKE 1 TABLET BY MOUTH DAILY   Multiple Vitamin (MULTIVITAMIN) capsule Take by mouth.   nitroGLYCERIN  (NITROSTAT ) 0.4 MG SL tablet Place 0.4 mg under the tongue every 5 (five) minutes as needed for chest pain.   omeprazole  (PRILOSEC) 20 MG capsule TAKE 1 CAPSULE BY MOUTH TWO TIMES DAILY   pravastatin  (PRAVACHOL ) 40 MG tablet Take 1 tablet by mouth daily.   No facility-administered encounter medications on file as of 08/25/2024.    History: Past Medical History:  Diagnosis Date   Basal cell carcinoma (BCC) of back    Basal cell carcinoma (BCC) of nasal sidewall    Dizziness    GERD (gastroesophageal reflux disease)    H/O blood clots    Headache    migraines   Hypercholesterolemia    Hypertension    hx of HBP readings   Metastatic squamous cell carcinoma 2018   bx cervical lymph node   Seizures (HCC) 2021   Tonsillar cancer (HCC)    Vascular abnormality    VTE (venous thromboembolism) 2018   associated with chemotherapy   Past Surgical History:  Procedure Laterality Date   COLONOSCOPY     COLONOSCOPY N/A 05/08/2021   Procedure: COLONOSCOPY;  Surgeon: Onita Elspeth Sharper, DO;  Location: [facility] ENDOSCOPY;  Service: Gastroenterology;  Laterality: N/A;   endovascular balloon angioplasty femoral/popliteal artery w/stent Left 11/08/2017   ESOPHAGOGASTRODUODENOSCOPY N/A 05/08/2021   Procedure: ESOPHAGOGASTRODUODENOSCOPY (EGD);  Surgeon: Onita Elspeth Sharper, DO;  Location: [facility] ENDOSCOPY;  Service: Gastroenterology;  Laterality: N/A;   ESOPHAGOGASTRODUODENOSCOPY (EGD) WITH PROPOFOL  N/A 05/24/2015   Procedure: ESOPHAGOGASTRODUODENOSCOPY (EGD) WITH PROPOFOL ;  Surgeon: Lamar ONEIDA Holmes, MD;  Location: [facility] ENDOSCOPY;  Service: Endoscopy;  Laterality: N/A;   FEMORAL-FEMORAL BYPASS GRAFT     IR IMAGING GUIDED PORT INSERTION  12/09/2016   LEFT HEART CATH AND CORONARY ANGIOGRAPHY N/A 12/25/2019   Procedure: LEFT  HEART CATH AND CORONARY ANGIOGRAPHY;  Surgeon: Hester Wolm PARAS, MD;  Location: [facility] INVASIVE CV LAB;  Service: Cardiovascular;  Laterality: N/A;   SAVORY DILATION N/A 05/24/2015   Procedure: SAVORY DILATION;  Surgeon: Lamar ONEIDA Holmes, MD;  Location: [facility] ENDOSCOPY;  Service: Endoscopy;  Laterality: N/A;   skin cancer removed from face   2024   Family History  Problem Relation Age of Onset   Hyperlipidemia Mother    Heart disease Mother    Hypertension Mother    Alzheimer's disease Mother    Prostate cancer Father    Stroke Father    Social History   Occupational History   Occupation: curator  Tobacco Use   Smoking status: Former    Current packs/day: 0.00    Types: Cigarettes    Quit date: 11/11/1996    Years since quitting: 27.8   Smokeless tobacco: Never  Vaping Use   Vaping status: Never Used  Substance and Sexual Activity   Alcohol use: Yes    Comment: occasional   Drug use: No   Sexual activity: Yes   Tobacco Counseling Counseling given: Not Answered  SDOH Screenings   Food Insecurity: No Food Insecurity (08/25/2024)  Housing: Low Risk (08/25/2024)  Transportation Needs: No Transportation Needs (08/25/2024)  Utilities: Not At Risk (08/25/2024)  Alcohol Screen: Low Risk (06/25/2023)  Depression (PHQ2-9): Low Risk (08/25/2024)  Financial Resource Strain: Low Risk (06/25/2023)  Physical Activity: Insufficiently Active (08/25/2024)  Social Connections: Moderately Isolated (08/25/2024)  Stress: No Stress Concern Present (08/25/2024)  Tobacco Use: Medium Risk (08/13/2024)  Health Literacy: Adequate Health Literacy (08/25/2024)   See flowsheets for full screening details  Depression Screen PHQ 2 & 9 Depression Scale- Over the past 2 weeks, how often have you been bothered by any of the following problems? Little interest or pleasure in doing things: 0 Feeling down, depressed, or hopeless (PHQ Adolescent also includes...irritable): 0 PHQ-2 Total Score: 0 Trouble falling or  staying asleep, or sleeping too much: 0 Feeling tired or having little energy: 1 Poor appetite or overeating (PHQ Adolescent also includes...weight loss): 0 Feeling bad about yourself - or that you are a failure or have let yourself or your family down: 0 Trouble concentrating on things, such as reading the newspaper or watching television (PHQ Adolescent also includes...like school work): 0 Moving or speaking so slowly that other people could have noticed. Or the opposite - being so fidgety or restless that you have been moving around a lot more than usual: 0 Thoughts that you would be better off dead, or of hurting yourself in some way: 0 PHQ-9 Total Score: 1 If you checked off any problems, how difficult have these problems made it for you to do your work, take care of things at home, or get along with other people?: Not difficult at all  Depression Treatment Depression Interventions/Treatment : [id number] Score <4 Follow-up Not Indicated     Goals Addressed             This Visit's Progress    Trying to maintain health and keep moving forward               Objective:    There were no vitals filed for this visit. There is no height or weight on file to calculate BMI.  Hearing/Vision screen No results found. Immunizations and Health Maintenance Health Maintenance  Topic Date Due   Pneumococcal Vaccine: 50+ Years (1 of 2 - PCV) Never done   Medicare Annual Wellness (AWV)  06/24/2024   COVID-19 Vaccine (3 - Pfizer risk series) 09/10/2024 (Originally 06/11/2020)   Zoster Vaccines- Shingrix (1 of 2) 11/02/2024 (Originally [birth year]02/1979)   Influenza Vaccine  11/14/2024 (Originally 03/17/2024)   Colonoscopy  05/09/2031   Hepatitis C Screening  Completed   HIV Screening  Completed   Hepatitis B Vaccines 19-59 Average Risk  Aged Out   HPV VACCINES  Aged Out   Meningococcal B Vaccine  Aged Out   DTaP/Tdap/Td  Discontinued        Assessment/Plan:  This is a routine wellness  examination for Timothy Garrison.  Patient Care Team: Glendia Shad, MD as PCP - General (Internal Medicine) Aundria Ladell POUR, MD as Consulting Physician (Gastroenterology) Maree Jannett POUR, MD as Consulting Physician (Neurology)  I have personally reviewed and noted the following in the patients chart:   Medical and social history Use of alcohol, tobacco or illicit drugs  Current medications and supplements including opioid prescriptions. Functional ability and status Nutritional status Physical activity Advanced directives List of other physicians Hospitalizations, surgeries, and ER visits in previous 12 months Vitals Screenings to include cognitive, depression, and falls Referrals and appointments  No orders of the defined types were placed in this encounter.  In addition, I have reviewed and discussed with patient certain preventive protocols, quality metrics, and best practice recommendations. A written personalized care plan for preventive services as well as general preventive health recommendations were provided to patient.  Arnette LOISE Hoots, CMA   08/25/2024   No follow-ups on file.  After Visit Summary: (Declined) Due to this being a telephonic visit, with patients personalized plan was offered to patient but patient Declined AVS at this time   Nurse Notes: The patient is doing well. He declines covid vaccines. No refills needed. His repatha  was $460 and he was wondering if there were any patient assistance programs. Message sent to provider.  "

## 2024-08-25 NOTE — Patient Instructions (Signed)
 Timothy Garrison,  Thank you for taking the time for your Medicare Wellness Visit. I appreciate your continued commitment to your health goals. Please review the care plan we discussed, and feel free to reach out if I can assist you further.  Please note that Annual Wellness Visits do not include a physical exam. Some assessments may be limited, especially if the visit was conducted virtually. If needed, we may recommend an in-person follow-up with your provider.  Ongoing Care Seeing your primary care provider every 3 to 6 months helps us  monitor your health and provide consistent, personalized care. 4/21@8   Referrals If a referral was made during today's visit and you haven't received any updates within two weeks, please contact the referred provider directly to check on the status.  Recommended Screenings:  Health Maintenance  Topic Date Due   Pneumococcal Vaccine for age over 68 (1 of 2 - PCV) Never done   COVID-19 Vaccine (3 - Pfizer risk series) 06/11/2020   Medicare Annual Wellness Visit  06/24/2024   Zoster (Shingles) Vaccine (1 of 2) 11/02/2024*   Flu Shot  11/14/2024*   Colon Cancer Screening  05/09/2031   Hepatitis C Screening  Completed   HIV Screening  Completed   Hepatitis B Vaccine  Aged Out   HPV Vaccine  Aged Out   Meningitis B Vaccine  Aged Out   DTaP/Tdap/Td vaccine  Discontinued  *Topic was postponed. The date shown is not the original due date.       08/25/2024    8:13 AM  Advanced Directives  Does Patient Have a Medical Advance Directive? Yes  Type of Advance Directive Living will;Healthcare Power of Attorney  Does patient want to make changes to medical advance directive? No - Guardian declined  Copy of Healthcare Power of Attorney in Chart? No - copy requested    Vision: Annual vision screenings are recommended for early detection of glaucoma, cataracts, and diabetic retinopathy. These exams can also reveal signs of chronic conditions such as diabetes and high  blood pressure.  Dental: Annual dental screenings help detect early signs of oral cancer, gum disease, and other conditions linked to overall health, including heart disease and diabetes.  Please see the attached documents for additional preventive care recommendations.

## 2024-11-21 ENCOUNTER — Other Ambulatory Visit

## 2024-12-05 ENCOUNTER — Ambulatory Visit: Admitting: Internal Medicine

## 2025-05-23 ENCOUNTER — Ambulatory Visit: Admitting: Oncology

## 2025-05-23 ENCOUNTER — Other Ambulatory Visit
